# Patient Record
Sex: Female | Born: 1950 | Race: White
Health system: Southern US, Community
[De-identification: ages and names within clinical notes are randomized; demographics above are authoritative.]

## PROBLEM LIST (undated history)

## (undated) DIAGNOSIS — G43909 Migraine, unspecified, not intractable, without status migrainosus: Secondary | ICD-10-CM

## (undated) DIAGNOSIS — K5792 Diverticulitis of intestine, part unspecified, without perforation or abscess without bleeding: Secondary | ICD-10-CM

## (undated) DIAGNOSIS — R197 Diarrhea, unspecified: Secondary | ICD-10-CM

## (undated) DIAGNOSIS — C480 Malignant neoplasm of retroperitoneum: Secondary | ICD-10-CM

## (undated) DIAGNOSIS — K219 Gastro-esophageal reflux disease without esophagitis: Secondary | ICD-10-CM

## (undated) DIAGNOSIS — K635 Polyp of colon: Secondary | ICD-10-CM

## (undated) DIAGNOSIS — J40 Bronchitis, not specified as acute or chronic: Secondary | ICD-10-CM

## (undated) DIAGNOSIS — M810 Age-related osteoporosis without current pathological fracture: Secondary | ICD-10-CM

## (undated) DIAGNOSIS — Z9289 Personal history of other medical treatment: Secondary | ICD-10-CM

## (undated) DIAGNOSIS — R1032 Left lower quadrant pain: Secondary | ICD-10-CM

## (undated) DIAGNOSIS — D649 Anemia, unspecified: Secondary | ICD-10-CM

## (undated) DIAGNOSIS — H3320 Serous retinal detachment, unspecified eye: Secondary | ICD-10-CM

## (undated) DIAGNOSIS — S37039A Laceration of unspecified kidney, unspecified degree, initial encounter: Secondary | ICD-10-CM

## (undated) DIAGNOSIS — N814 Uterovaginal prolapse, unspecified: Secondary | ICD-10-CM

## (undated) DIAGNOSIS — C801 Malignant (primary) neoplasm, unspecified: Secondary | ICD-10-CM

## (undated) DIAGNOSIS — D509 Iron deficiency anemia, unspecified: Secondary | ICD-10-CM

## (undated) DIAGNOSIS — C189 Malignant neoplasm of colon, unspecified: Secondary | ICD-10-CM

## (undated) DIAGNOSIS — Z8669 Personal history of other diseases of the nervous system and sense organs: Secondary | ICD-10-CM

## (undated) HISTORY — DX: Serous retinal detachment, unspecified eye: H33.20

## (undated) HISTORY — PX: BOWEL RESECTION: SHX1257

## (undated) HISTORY — DX: Left lower quadrant pain: R10.32

## (undated) HISTORY — PX: HX NEPHRECTOMY: SHX65

## (undated) HISTORY — DX: Iron deficiency anemia, unspecified: D50.9

## (undated) HISTORY — DX: Age-related osteoporosis without current pathological fracture: M81.0

## (undated) HISTORY — PX: PANCREATECTOMY: SHX1019

## (undated) HISTORY — DX: Laceration of unspecified kidney, unspecified degree, initial encounter: S37.039A

## (undated) HISTORY — PX: HX ENDOSCOPIC SINUS SURGERY: 2100001109

## (undated) HISTORY — PX: HX COLECTOMY: SHX59

## (undated) HISTORY — DX: Malignant neoplasm of colon, unspecified: C18.9

## (undated) HISTORY — DX: Personal history of other diseases of the nervous system and sense organs: Z86.69

## (undated) HISTORY — PX: HX COLONOSCOPY: 2100001147

## (undated) HISTORY — DX: Gastro-esophageal reflux disease without esophagitis: K21.9

## (undated) HISTORY — DX: Personal history of other medical treatment: Z92.89

## (undated) HISTORY — PX: SPLENECTOMY, TOTAL: SHX788

## (undated) HISTORY — DX: Anemia, unspecified: D64.9

## (undated) HISTORY — PX: HX BREAST BIOPSY: SHX20

## (undated) HISTORY — DX: Uterovaginal prolapse, unspecified: N81.4

## (undated) HISTORY — PX: LUNG CANCER SURGERY: SHX702

## (undated) HISTORY — DX: Migraine, unspecified, not intractable, without status migrainosus: G43.909

## (undated) HISTORY — DX: Malignant (primary) neoplasm, unspecified: C80.1

## (undated) HISTORY — PX: TONSILLECTOMY: SUR1361

---
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
## 1985-11-29 ENCOUNTER — Ambulatory Visit: Admit: 1985-11-29 | Payer: Self-pay | Source: Ambulatory Visit

## 1985-12-15 ENCOUNTER — Inpatient Hospital Stay
Admission: AD | Admit: 1985-12-15 | Disposition: A | Discharge status: Home / Self Care | Payer: Self-pay | Source: Ambulatory Visit

## 1986-05-27 ENCOUNTER — Ambulatory Visit: Admit: 1986-05-27 | Payer: Self-pay | Source: Ambulatory Visit

## 1986-06-03 ENCOUNTER — Inpatient Hospital Stay
Admission: AD | Admit: 1986-06-03 | Disposition: A | Discharge status: Home / Self Care | Payer: Self-pay | Source: Ambulatory Visit

## 1988-06-20 ENCOUNTER — Ambulatory Visit: Admit: 1988-06-20 | Payer: Self-pay | Source: Ambulatory Visit

## 1992-01-13 ENCOUNTER — Ambulatory Visit: Admit: 1992-01-13 | Disposition: A | Discharge status: Home / Self Care | Payer: Self-pay | Source: Ambulatory Visit

## 1992-01-27 ENCOUNTER — Ambulatory Visit: Admit: 1992-01-27 | Disposition: A | Discharge status: Home / Self Care | Payer: Self-pay | Source: Ambulatory Visit

## 1993-12-21 ENCOUNTER — Ambulatory Visit: Admit: 1993-12-21 | Disposition: A | Discharge status: Home / Self Care | Payer: Self-pay | Source: Ambulatory Visit

## 1993-12-29 ENCOUNTER — Ambulatory Visit: Admit: 1993-12-29 | Disposition: A | Discharge status: Home / Self Care | Payer: Self-pay | Source: Ambulatory Visit

## 1994-01-16 ENCOUNTER — Ambulatory Visit: Admit: 1994-01-16 | Disposition: A | Discharge status: Home / Self Care | Payer: Self-pay | Source: Ambulatory Visit

## 2001-01-23 ENCOUNTER — Ambulatory Visit
Admission: RE | Admit: 2001-01-23 | Disposition: A | Discharge status: Home / Self Care | Payer: Self-pay | Source: Ambulatory Visit

## 2001-01-25 ENCOUNTER — Emergency Department: Payer: Self-pay

## 2001-06-04 ENCOUNTER — Ambulatory Visit
Admit: 2001-06-04 | Disposition: A | Discharge status: Home / Self Care | Payer: Self-pay | Source: Ambulatory Visit | Admitting: Specialist

## 2001-09-25 ENCOUNTER — Ambulatory Visit
Admit: 2001-09-25 | Disposition: A | Discharge status: Home / Self Care | Payer: Self-pay | Source: Ambulatory Visit | Admitting: Specialist

## 2002-01-08 ENCOUNTER — Ambulatory Visit
Admission: RE | Admit: 2002-01-08 | Disposition: A | Discharge status: Home / Self Care | Payer: Self-pay | Source: Ambulatory Visit

## 2002-01-23 ENCOUNTER — Ambulatory Visit
Admission: RE | Admit: 2002-01-23 | Disposition: A | Discharge status: Home / Self Care | Payer: Self-pay | Source: Ambulatory Visit

## 2002-02-04 ENCOUNTER — Ambulatory Visit
Admission: RE | Admit: 2002-02-04 | Disposition: A | Discharge status: Home / Self Care | Payer: Self-pay | Source: Ambulatory Visit

## 2002-05-12 ENCOUNTER — Emergency Department: Admit: 2002-05-12 | Payer: Self-pay | Source: Emergency Department | Admitting: Emergency Medicine

## 2002-06-06 ENCOUNTER — Ambulatory Visit: Admit: 2002-06-06 | Disposition: A | Discharge status: Home / Self Care | Payer: Self-pay | Source: Ambulatory Visit

## 2002-07-19 ENCOUNTER — Ambulatory Visit: Payer: Self-pay

## 2002-07-31 ENCOUNTER — Ambulatory Visit
Admission: RE | Admit: 2002-07-31 | Disposition: A | Discharge status: Home / Self Care | Payer: Self-pay | Source: Ambulatory Visit

## 2002-11-21 ENCOUNTER — Emergency Department: Payer: Self-pay

## 2003-06-06 DIAGNOSIS — N63 Unspecified lump in unspecified breast: Secondary | ICD-10-CM

## 2003-06-06 HISTORY — DX: Unspecified lump in unspecified breast: N63.0

## 2003-07-15 ENCOUNTER — Ambulatory Visit
Admission: RE | Admit: 2003-07-15 | Disposition: A | Discharge status: Home / Self Care | Payer: Self-pay | Source: Ambulatory Visit

## 2005-01-08 ENCOUNTER — Emergency Department: Payer: Self-pay

## 2005-10-03 ENCOUNTER — Ambulatory Visit
Admission: RE | Admit: 2005-10-03 | Disposition: A | Discharge status: Home / Self Care | Payer: Self-pay | Source: Ambulatory Visit

## 2005-10-18 ENCOUNTER — Ambulatory Visit: Admit: 2005-10-18 | Disposition: A | Discharge status: Home / Self Care | Payer: Self-pay | Source: Ambulatory Visit

## 2005-10-26 ENCOUNTER — Ambulatory Visit
Admission: RE | Admit: 2005-10-26 | Disposition: A | Discharge status: Home / Self Care | Payer: Self-pay | Source: Ambulatory Visit

## 2005-11-22 ENCOUNTER — Ambulatory Visit
Admission: RE | Admit: 2005-11-22 | Disposition: A | Discharge status: Home / Self Care | Payer: Self-pay | Source: Ambulatory Visit

## 2005-12-25 ENCOUNTER — Emergency Department
Admission: EM | Admit: 2005-12-25 | Disposition: A | Discharge status: Home / Self Care | Payer: Self-pay | Source: Ambulatory Visit

## 2006-02-19 ENCOUNTER — Ambulatory Visit: Payer: Self-pay

## 2006-08-02 ENCOUNTER — Ambulatory Visit: Payer: Self-pay

## 2007-04-03 ENCOUNTER — Ambulatory Visit
Admission: RE | Admit: 2007-04-03 | Disposition: A | Discharge status: Home / Self Care | Payer: Self-pay | Source: Ambulatory Visit

## 2007-11-23 ENCOUNTER — Inpatient Hospital Stay (HOSPITAL_BASED_OUTPATIENT_CLINIC_OR_DEPARTMENT_OTHER)
Admission: EM | Admit: 2007-11-23 | Discharge: 2007-11-23 | Disposition: A | Discharge status: Home / Self Care | Payer: No Typology Code available for payment source

## 2007-11-23 ENCOUNTER — Emergency Department: Payer: Self-pay

## 2008-01-23 ENCOUNTER — Emergency Department: Payer: Self-pay

## 2008-01-23 ENCOUNTER — Inpatient Hospital Stay (HOSPITAL_BASED_OUTPATIENT_CLINIC_OR_DEPARTMENT_OTHER)
Admission: EM | Admit: 2008-01-23 | Discharge: 2008-01-23 | Disposition: A | Discharge status: Home / Self Care | Payer: No Typology Code available for payment source

## 2008-04-03 ENCOUNTER — Ambulatory Visit: Payer: Self-pay

## 2008-06-02 ENCOUNTER — Ambulatory Visit: Payer: Self-pay

## 2008-06-04 ENCOUNTER — Ambulatory Visit: Payer: Self-pay

## 2008-06-18 ENCOUNTER — Ambulatory Visit: Payer: Self-pay

## 2008-10-19 ENCOUNTER — Ambulatory Visit: Admission: RE | Admit: 2008-10-19 | Payer: Self-pay | Source: Ambulatory Visit | Admitting: EXTERNAL

## 2008-11-09 ENCOUNTER — Ambulatory Visit: Admission: RE | Admit: 2008-11-09 | Payer: Self-pay | Source: Ambulatory Visit | Admitting: EXTERNAL

## 2008-11-10 ENCOUNTER — Ambulatory Visit: Admission: RE | Admit: 2008-11-10 | Payer: Self-pay | Source: Ambulatory Visit | Admitting: EXTERNAL

## 2009-04-06 ENCOUNTER — Ambulatory Visit: Admission: RE | Admit: 2009-04-06 | Payer: Self-pay | Source: Ambulatory Visit

## 2009-09-04 ENCOUNTER — Ambulatory Visit: Admit: 2009-09-04 | Payer: Self-pay | Source: Ambulatory Visit

## 2009-09-20 ENCOUNTER — Other Ambulatory Visit: Payer: Self-pay

## 2009-09-20 ENCOUNTER — Ambulatory Visit: Admit: 2009-09-20 | Payer: Self-pay | Source: Ambulatory Visit | Admitting: Family Medicine

## 2009-09-20 ENCOUNTER — Ambulatory Visit (INDEPENDENT_AMBULATORY_CARE_PROVIDER_SITE_OTHER): Payer: No Typology Code available for payment source | Admitting: Family Medicine

## 2009-09-20 VITALS — BP 118/70 | HR 68 | Resp 14 | Ht 67.2 in | Wt 188.3 lb

## 2009-09-22 LAB — HISTORICAL CYTOPATHOLOGY-GYN (PAP AND HPV TESTS): Final Diagnosis: NEGATIVE

## 2009-09-27 ENCOUNTER — Ambulatory Visit: Admission: RE | Admit: 2009-09-27 | Payer: Self-pay | Source: Ambulatory Visit | Admitting: Family Medicine

## 2009-09-28 ENCOUNTER — Ambulatory Visit: Admission: RE | Admit: 2009-09-28 | Payer: Self-pay | Source: Ambulatory Visit | Admitting: UROLOGY

## 2009-10-21 ENCOUNTER — Encounter (INDEPENDENT_AMBULATORY_CARE_PROVIDER_SITE_OTHER): Payer: Self-pay | Admitting: Family Medicine

## 2009-10-21 NOTE — Progress Notes (Signed)
Subjective:     [name] is a 59 y.o. female here for annual exam.  She is also complaining of the following issues:    1. Urinary frequency but has an appointment scheduled with urologist.  Past Medical History   Diagnosis Date   . Breast lump 2005     Resolved prior to biopsy     Past Surgical History   Procedure Date   . Hx cesarean section 1993   . Hx tonsillectomy 1955     Allergies   Allergen Reactions   . Percocet (Oxycodone-acetaminophen) Nausea/ Vomiting         No outpatient prescriptions prior to encounter  Family History   Problem Relation Age of Onset   . Congestive Heart Failure Mother    . Diabetes Father    . Colon Cancer Mother    . Colon Cancer Father        OB History    Grav Para Term Preterm Abortions TAB SAB Ect Mult Living    2 2        2           History   Social History   . Marital Status: Married     Spouse Name: N/A     Number of Children: 2   . Years of Education: N/A   Social History Main Topics   . Smoking status: No   . Smokeless tobacco: No   . Alcohol Use: No   . Drug Use: No       Review of Systems: Other than ROS in the HPI, all other systems were negative.   Objective:     BP 118/70   Pulse 68   Resp 14   Ht 1.707 m (5' 7.2")   Wt 85.412 kg (188 lb 4.8 oz)   BMI 29.32 kg/m2 Body mass index is 29.32 kg/(m^2).    Normalized BMI data available only for age 48 to 20 years.  No LMP recorded. Patient is postmenopausal.  General:  alert, cooperative, no distress   Head:  normocephalic, atraumatic.    Oropharynx: normal   ENT: ENT exam normal, no neck nodes or sinus tenderness   Neck: nontender, no masses, thyroid nonenlarged   Lung: clear to auscultation bilaterally   Heart:  regular rate and rhythm, S1, S2 normal, no murmur, click, rub or gallop   Abdomen: Soft, non-tender, non-distended. Bowel sounds normal. No masses. No organomegaly   Extremities: extremities normal, atraumatic, no cyanosis or edema    Skin: Warm and dry. no hyperpigmentation, vitiligo, or suspicious lesions   Neuro: normal without focal findings  mental status, speech normal, alert and oriented x iii  PERLA  gait and station normal   Breast exam: normal without suspicious masses, skin or nipple changes or axillary nodes, self-exam is taught and encouraged   Pelvic exam:  exam chaperoned by nurse, normal vagina and vulva, normal cervix without lesions, polyps or tenderness, uterus normal size, shape, consistency, no mass or tenderness, adnexa normal in size without mass or tenderness, normal pelvic floor musculature, pap smear done today     Assessment/Plan:       V76.68F Screening for cervical cancer  (primary encounter diagnosis)  Plan: CYTOPATHOLOGY-GYN (PAP TESTS ONLY)    V72.31AA Gynecologic exam  Plan: MAMMO BILATERAL SCREENING    285.9AA Anemia    Orders Placed This Encounter   . Mammo Bilateral Screening   . CYTOPATHOLOGY-GYN (PAP TESTS ONLY)     follow up prn

## 2010-03-03 ENCOUNTER — Ambulatory Visit: Admit: 2010-03-03 | Payer: Self-pay | Source: Ambulatory Visit

## 2010-03-03 LAB — CBC
BASOPHIL #: 0.05 K/uL (ref 0.00–0.10)
BASOPHILS %: 1.1 % (ref 0.0–1.4)
EOSINOPHIL #: 0.09 K/uL (ref 0.00–0.50)
EOSINOPHIL %: 2 % (ref 0.0–5.2)
HCT: 35.2 % — ABNORMAL LOW (ref 36.0–45.0)
HGB: 11.6 g/dL — ABNORMAL LOW (ref 12.0–15.5)
LYMPHOCYTE #: 1.37 K/uL (ref 0.70–3.20)
LYMPHOCYTE %: 29.7 % (ref 15.0–43.0)
MCH: 32.1 pg (ref 28.0–34.0)
MCHC: 33.1 g/dL (ref 33.0–37.0)
MCV: 97 fL — AB (ref 82.0–97.0)
MONOCYTE #: 0.38 K/uL (ref 0.20–0.90)
MONOCYTE %: 8.2 % (ref 4.8–12.0)
MPV: 9.6 fL — ABNORMAL HIGH (ref 7.0–9.4)
PLATELET COUNT: 172 K/uL (ref 150–400)
PMN #: 2.72 K/uL (ref 1.50–6.50)
PMN %: 58.9 % (ref 43.0–76.0)
RBC: 3.63 M/uL — ABNORMAL LOW (ref 4.00–5.10)
RDW: 11.6 % (ref 11.0–13.0)
WBC: 4.6 K/uL (ref 4.0–11.0)

## 2010-03-03 LAB — COMPREHENSIVE METABOLIC PROFILE - BMC/JMC ONLY
ALBUMIN: 4.3 g/dL (ref 3.2–5.0)
ALKALINE PHOSPHATASE: 50 IU/L (ref 35–120)
ALT (SGPT): 28 IU/L (ref 0–55)
AST (SGOT): 32 IU/L (ref 0–45)
BILIRUBIN, TOTAL: 0.9 mg/dL (ref 0.0–1.3)
BUN: 14 mg/dL (ref 6–22)
CALCIUM: 9.5 mg/dL (ref 8.5–10.5)
CARBON DIOXIDE: 29 mmol/L (ref 22–32)
CHLORIDE: 103 mmol/L (ref 101–111)
CREATININE: 0.63 mg/dL (ref 0.53–1.00)
ESTIMATED GLOMERULAR FILTRATION RATE: >60 mL/min (ref >60)
GLUCOSE: 88 mg/dL (ref 70–110)
POTASSIUM: 4.1 mmol/L (ref 3.5–5.0)
SODIUM: 139 mmol/L (ref 136–145)
TOTAL PROTEIN: 6.8 g/dL (ref 6.0–8.0)

## 2010-03-09 LAB — CBC
BASOPHIL #: 0 K/uL (ref 0.00–0.10)
BASOPHILS %: 0 % (ref 0.0–1.4)
EOSINOPHIL #: 0.16 K/uL (ref 0.00–0.50)
EOSINOPHIL %: 4.2 % (ref 0.0–5.2)
HCT: 38.1 % (ref 36.0–45.0)
HGB: 12.3 g/dL (ref 12.0–15.5)
LYMPHOCYTE #: 1.11 K/uL (ref 0.70–3.20)
LYMPHOCYTE %: 28.1 % (ref 15.0–43.0)
MCH: 31.4 pg (ref 28.0–34.0)
MCHC: 32.2 g/dL — ABNORMAL LOW (ref 33.0–37.0)
MCV: 97.7 fL — ABNORMAL HIGH (ref 82.0–97.0)
MONOCYTE #: 0.55 K/uL (ref 0.20–0.90)
MONOCYTE %: 13.9 % — ABNORMAL HIGH (ref 4.8–12.0)
MPV: 8.3 fL (ref 7.0–9.4)
PLATELET COUNT: 161 K/uL (ref 150–400)
PMN #: 2.12 K/uL (ref 1.50–6.50)
PMN %: 53.8 % (ref 43.0–76.0)
RBC: 3.9 M/uL — ABNORMAL LOW (ref 4.00–5.10)
RDW: 11.4 % (ref 11.0–13.0)
WBC: 3.9 K/uL — ABNORMAL LOW (ref 4.0–11.0)

## 2010-03-09 LAB — COMPREHENSIVE METABOLIC PROFILE - BMC/JMC ONLY
ALBUMIN: 3.9 g/dL (ref 3.2–5.0)
BUN: 12 mg/dL (ref 6–22)
CREATININE: 0.84 mg/dL (ref 0.53–1.00)
ESTIMATED GLOMERULAR FILTRATION RATE: >60 mL/min (ref >60)
GLUCOSE: 92 mg/dL (ref 70–110)
TOTAL PROTEIN: 6.2 g/dL (ref 6.0–8.0)

## 2010-03-09 LAB — LIPASE: LIPASE: 24 U/L (ref 0–60)

## 2010-03-09 LAB — C-REACTIVE PROTEIN(CRP),INFLAMMATION: C-REACTIVE PROTEIN (CRP),INFLAMMATION: 3.2 mg/L (ref 0.0–8.0)

## 2010-03-10 ENCOUNTER — Inpatient Hospital Stay
Admission: RE | Admit: 2010-03-10 | Discharge: 2010-03-12 | Disposition: A | Discharge status: Home / Self Care | Payer: Self-pay | Source: Ambulatory Visit

## 2010-03-10 LAB — TRANSFERRIN SATURATION: TRANSFERRIN SATURATION: 22 % (ref 15–50)

## 2010-03-10 LAB — CBC
BASOPHIL #: 0.02 K/uL (ref 0.00–0.10)
BASOPHILS %: 0.8 % (ref 0.0–1.4)
EOSINOPHIL #: 0.17 K/uL (ref 0.00–0.50)
EOSINOPHIL %: 6 % — ABNORMAL HIGH (ref 0.0–5.2)
HCT: 33.4 % — ABNORMAL LOW (ref 36.0–45.0)
HGB: 11 g/dL — ABNORMAL LOW (ref 12.0–15.5)
LYMPHOCYTE #: 1.21 K/uL (ref 0.70–3.20)
LYMPHOCYTE %: 41.5 % (ref 15.0–43.0)
MCH: 32 pg (ref 28.0–34.0)
MCHC: 32.9 g/dL — ABNORMAL LOW (ref 33.0–37.0)
MCV: 97.5 fL — ABNORMAL HIGH (ref 82.0–97.0)
MONOCYTE #: 0.48 K/uL (ref 0.20–0.90)
MONOCYTE %: 16.6 % — ABNORMAL HIGH (ref 4.8–12.0)
MPV: 9 fL (ref 7.0–9.4)
PLATELET COUNT: 165 K/uL (ref 150–400)
PMN #: 1.02 K/uL — ABNORMAL LOW (ref 1.50–6.50)
PMN %: 35.1 % — ABNORMAL LOW (ref 43.0–76.0)
RBC: 3.42 M/uL — ABNORMAL LOW (ref 4.00–5.10)
RDW: 11.3 % (ref 11.0–13.0)
WBC: 2.9 K/uL — ABNORMAL LOW (ref 4.0–11.0)

## 2010-03-10 LAB — BASIC METABOLIC PROFILE - BMC/JMC ONLY
BUN: 8 mg/dL (ref 6–22)
CALCIUM: 8.8 mg/dL (ref 8.5–10.5)
CARBON DIOXIDE: 27 mmol/L (ref 22–32)
CHLORIDE: 106 mmol/L (ref 101–111)
CREATININE: 0.69 mg/dL (ref 0.53–1.00)
ESTIMATED GLOMERULAR FILTRATION RATE: >60 mL/min (ref >60)
GLUCOSE: 93 mg/dL (ref 70–110)
POTASSIUM: 4 mmol/L (ref 3.5–5.0)
SODIUM: 138 mmol/L (ref 136–145)

## 2010-03-10 LAB — URINALYSIS, MACROSCOPIC AND MICROSCOPIC
BILIRUBIN,URINE: NEGATIVE mg/dL
BLOOD,URINE: NEGATIVE mg/dL
GLUCOSE, URINE: NEGATIVE mg/dL
KETONES,URINE: NEGATIVE mg/dL
LEUKOCYTE ESTERASE,URINE: NEGATIVE LEU/uL
NITRITES,URINE: NEGATIVE
PH,URINE: 6.5 (ref <8.0)
PROTEIN, URINE, RANDOM: NEGATIVE mg/dL
SPECIFIC GRAVITY,URINE: 1.004 (ref <1.022)
UROBILINOGEN, URINE: <2 mg/dL (ref <2.0)

## 2010-03-10 LAB — C-REACTIVE PROTEIN(CRP),INFLAMMATION
C-REACTIVE PROTEIN (CRP),INFLAMMATION: 2.4 mg/L — AB (ref 0.0–8.0)
C-REACTIVE PROTEIN (CRP),INFLAMMATION: 2.2 mg/L (ref 0.0–8.0)

## 2010-03-10 LAB — TRANSFERRIN: TRANSFERRIN: 197 mg/dL (ref 184–359)

## 2010-03-10 LAB — FERRITIN: FERRITIN: 11 ng/mL (ref 11–307)

## 2010-03-10 LAB — SEDIMENTATION RATE: SEDIMENTATION RATE: 8 mm/hr (ref 0–30)

## 2010-03-10 LAB — IRON: IRON: 63 ug/dL (ref 35–145)

## 2010-03-10 LAB — VITAMIN B12: VITAMIN B12: 481 pg/mL (ref 180–914)

## 2010-03-10 LAB — THYROXINE, FREE (FREE T4): THYROXINE, FREE (FREE T4): 0.87 ng/dL (ref 0.58–1.64)

## 2010-03-10 LAB — FOLATE: FOLATE: 17.9 ng/mL (ref >3.6)

## 2010-03-10 LAB — FREE T3 - BMC/JMC ONLY: FREE T3: 2.2 pg/mL — ABNORMAL LOW (ref 2.5–3.9)

## 2010-03-10 LAB — TSH,3RD GENERATION: TSH 3RD GENERATION: 1.155 u[IU]/mL (ref 0.340–5.600)

## 2010-03-11 LAB — CBC
BASOPHIL #: 0.03 K/uL (ref 0.00–0.10)
BASOPHILS %: 1 % (ref 0.0–1.4)
EOSINOPHIL #: 0.14 K/uL (ref 0.00–0.50)
EOSINOPHIL %: 5 % (ref 0.0–5.2)
HCT: 31.4 % — ABNORMAL LOW (ref 36.0–45.0)
HGB: 10.4 g/dL — ABNORMAL LOW (ref 12.0–15.5)
LYMPHOCYTE #: 1.27 K/uL (ref 0.70–3.20)
LYMPHOCYTE %: 45 % — ABNORMAL HIGH (ref 15.0–43.0)
MCH: 32.4 pg (ref 28.0–34.0)
MCHC: 33.2 g/dL (ref 33.0–37.0)
MCV: 97.5 fL — ABNORMAL HIGH (ref 82.0–97.0)
MONOCYTE #: 0.33 K/uL (ref 0.20–0.90)
MONOCYTE %: 11.6 % (ref 4.8–12.0)
MPV: 8.7 fL (ref 7.0–9.4)
PLATELET COUNT: 137 K/uL — ABNORMAL LOW (ref 150–400)
PMN #: 1.05 K/uL — ABNORMAL LOW (ref 1.50–6.50)
PMN %: 37.3 % — ABNORMAL LOW (ref 43.0–76.0)
RBC: 3.22 M/uL — ABNORMAL LOW (ref 4.00–5.10)
RDW: 11.3 % (ref 11.0–13.0)
WBC: 2.8 K/uL — ABNORMAL LOW (ref 4.0–11.0)

## 2010-03-11 LAB — COMPREHENSIVE METABOLIC PROFILE - BMC/JMC ONLY
ALBUMIN: 3.1 g/dL — AB (ref 3.2–5.0)
ALKALINE PHOSPHATASE: 37 IU/L (ref 35–120)
ALT (SGPT): 22 IU/L — AB (ref 0–55)
AST (SGOT): 25 IU/L (ref 0–45)
BILIRUBIN, TOTAL: 0.5 mg/dL (ref 0.0–1.3)
BUN: 9 mg/dL (ref 6–22)
CALCIUM: 8.5 mg/dL (ref 8.5–10.5)
CARBON DIOXIDE: 29 mmol/L (ref 22–32)
CHLORIDE: 110 mmol/L (ref 101–111)
CREATININE: 0.67 mg/dL (ref 0.53–1.00)
ESTIMATED GLOMERULAR FILTRATION RATE: >60 mL/min (ref >60)
GLUCOSE: 99 mg/dL (ref 70–110)
POTASSIUM: 3.7 mmol/L (ref 3.5–5.0)
SODIUM: 142 mmol/L (ref 136–145)
TOTAL PROTEIN: 5 g/dL — AB (ref 6.0–8.0)

## 2010-03-12 LAB — CBC
BASOPHIL #: 0.02 K/uL (ref 0.00–0.10)
BASOPHILS %: 0.7 % (ref 0.0–1.4)
EOSINOPHIL #: 0.12 K/uL (ref 0.00–0.50)
EOSINOPHIL %: 3.8 % (ref 0.0–5.2)
HCT: 33 % — ABNORMAL LOW (ref 36.0–45.0)
HGB: 10.4 g/dL — ABNORMAL LOW (ref 12.0–15.5)
LYMPHOCYTE #: 1.32 K/uL (ref 0.70–3.20)
LYMPHOCYTE %: 42.3 % (ref 15.0–43.0)
MCH: 30.8 pg (ref 28.0–34.0)
MCHC: 31.5 g/dL — ABNORMAL LOW (ref 33.0–37.0)
MCV: 97.9 fL — ABNORMAL HIGH (ref 82.0–97.0)
MONOCYTE #: 0.43 K/uL (ref 0.20–0.90)
MONOCYTE %: 13.7 % — ABNORMAL HIGH (ref 4.8–12.0)
MPV: 9 fL (ref 7.0–9.4)
PLATELET COUNT: 144 K/uL — ABNORMAL LOW (ref 150–400)
PMN #: 1.23 K/uL — ABNORMAL LOW (ref 1.50–6.50)
PMN %: 39.5 % — ABNORMAL LOW (ref 43.0–76.0)
RBC: 3.37 M/uL — ABNORMAL LOW (ref 4.00–5.10)
RDW: 11.3 % (ref 11.0–13.0)
WBC: 3.1 K/uL — ABNORMAL LOW (ref 4.0–11.0)

## 2010-03-12 LAB — COMPREHENSIVE METABOLIC PROFILE - BMC/JMC ONLY
ALBUMIN: 3.3 g/dL (ref 3.2–5.0)
ALKALINE PHOSPHATASE: 37 IU/L (ref 35–120)
ALT (SGPT): 21 IU/L — AB (ref 0–55)
AST (SGOT): 27 IU/L (ref 0–45)
BILIRUBIN, TOTAL: 0.4 mg/dL (ref 0.0–1.3)
BUN: 8 mg/dL (ref 6–22)
CALCIUM: 8.7 mg/dL (ref 8.5–10.5)
CARBON DIOXIDE: 26 mmol/L (ref 22–32)
CHLORIDE: 110 mmol/L (ref 101–111)
CREATININE: 0.65 mg/dL (ref 0.53–1.00)
ESTIMATED GLOMERULAR FILTRATION RATE: >60 mL/min (ref >60)
GLUCOSE: 100 mg/dL (ref 70–110)
POTASSIUM: 3.6 mmol/L (ref 3.5–5.0)
SODIUM: 139 mmol/L (ref 136–145)
TOTAL PROTEIN: 5.3 g/dL — ABNORMAL LOW (ref 6.0–8.0)

## 2010-03-12 LAB — URINE CULTURE

## 2010-03-12 LAB — CLOS DIFFICILE BY DNA - BMC/JMC ONLY: CLOS DIFFICILE BY DNA: NEGATIVE

## 2010-03-12 LAB — FECAL WHITE BLOOD CELLS: STOOL FOR LEUKOCYTES: NONE SEEN

## 2010-03-12 LAB — C-REACTIVE PROTEIN(CRP),INFLAMMATION: C-REACTIVE PROTEIN (CRP),INFLAMMATION: 0.8 mg/L — AB (ref 0.0–8.0)

## 2010-03-13 LAB — STOOL CULTURE - BMC/JMC ONLY

## 2010-03-14 LAB — HEP-2 SUBSTRATE ANTINUCLEAR ANTIBODIES (ANA), SERUM: ANTI-NUCLEAR AB: NEGATIVE

## 2010-03-16 LAB — OVA & PARASITES W/TRICHROME - BMC/JMC ONLY

## 2010-03-31 ENCOUNTER — Ambulatory Visit: Admission: RE | Admit: 2010-03-31 | Payer: Self-pay | Source: Ambulatory Visit | Admitting: Hematology & Oncology

## 2010-03-31 LAB — CBC
BASOPHIL #: 0.03 K/uL (ref 0.0–0.10)
BASOPHILS %: 0.6 % (ref 0–2.50)
EOSINOPHIL #: 0.13 K/uL (ref 0.00–0.50)
EOSINOPHIL %: 2.5 % (ref 0.0–5.2)
HCT: 36.7 % (ref 36.0–48.0)
HGB: 12 g/dL (ref 12.0–16.0)
LYMPHOCYTE #: 1.51 K/uL (ref 0.7–3.20)
LYMPHOCYTE %: 28.3 % (ref 15.0–43.0)
MCH: 31 pg (ref 28.3–34.3)
MCHC: 32.6 g/dL (ref 32.0–36.0)
MCV: 95.2 fL (ref 82.0–100.0)
MONOCYTE #: 0.63 K/uL (ref 0.20–0.90)
MONOCYTE %: 11.9 % (ref 4.8–12.0)
MPV: 8.4 fL (ref 7.4–10.45)
PLATELET COUNT: 190 K/uL — AB (ref 150–400)
PMN #: 3.03 K/uL (ref 1.5–6.5)
PMN %: 56.7 % (ref 43.0–76.0)
RBC: 3.86 M/uL — ABNORMAL LOW (ref 4.0–5.6)
RDW: 11.9 % (ref 11.0–16.0)
WBC: 5.3 K/uL (ref 4.0–11.0)

## 2010-04-05 ENCOUNTER — Encounter (INDEPENDENT_AMBULATORY_CARE_PROVIDER_SITE_OTHER): Payer: Self-pay | Admitting: Hematology & Oncology

## 2010-04-05 ENCOUNTER — Ambulatory Visit (INDEPENDENT_AMBULATORY_CARE_PROVIDER_SITE_OTHER): Payer: No Typology Code available for payment source | Admitting: Hematology & Oncology

## 2010-04-06 ENCOUNTER — Ambulatory Visit: Admission: RE | Admit: 2010-04-06 | Payer: Self-pay | Source: Ambulatory Visit | Admitting: Hematology & Oncology

## 2010-04-06 NOTE — Progress Notes (Signed)
[facility] , Called pt insurance no prior auth needed per Roxana Pt has been informed to drink 32oz of water prior one hour and a half before test pt has been [facility] pt verbally understood

## 2010-04-09 NOTE — Progress Notes (Signed)
[facility]  [address]  [place], [place] [address]    PROGRESS NOTE    PATIENT NAME: [name], [name]  CHART NUMBER: [id number]  DATE OF BIRTH: [birth year]  DATE OF SERVICE: 04/05/2010    REASON FOR VISIT:  Anemia and pancytopenia.    SUBJECTIVE:  [name] is a 59 year old female who was seen in the hospital in a consultation by myself.  At the time she was seen, she had leukopenia, anemia, and thrombocytopenia.  A number of tests were done. Thyroid functions are normal.  Her TSH is normal.  Her C-reactive protein was normal.  Her sedimentation rate was normal.  ANA normal.  Her iron was normal, but her ferritin was at the low end of normal.  Folate and B12 were also normal.      The patient had been admitted to the hospital because of left lower quadrant pain.  The exact etiology of the pain remains uncertain.  She went through several courses of antibiotics on the presumptive diagnosis of diverticulitis.  She was placed in the hospital and given intravenous antibiotics with only a slight improvement.  At the time she was seen, the most obvious potential cause of her pancytopenia was the fact that she was using nutraceuticals.  I asked her to stop the nutraceuticals and to see me in approximately 4 weeks later  To determine if the counts had improved.  Today's visit was for that purpose.     She has indeed stopped all the nutritional supplements.  A repeat CBC is now essentially normal.  White count, hemoglobin and a platelet count have all returned to within normal range.  While this does not prove that the nutraceuticals were the cause, it certainly places a high likelihood upon it.  Regardless, it avoids the need for a bone marrow biopsy.     The patient continues to have left lower quadrant pain in spite of the antibiotics.  She is also beginning to have bloating.  She has not had a good GYN examination and I suggested to her that she have an ultrasound and be seen by a gynecologist. She also complains of fatigue.    OBJECTIVE:  The blood pressure is 120/80, pulse is 80.      Skin:  Normal color and turgor.  HEENT:  Normal oral mucosa.  No adenopathy.  Lungs are clear.  Cardiovascular:  Regular rhythm, no murmurs or gallops. Abdomen:  Soft, nontender, no masses.  Persistent tenderness in the left lower quadrant.  Extremities:  No edema.    ASSESSMENT:  There appeared to be two separate issues in this patient's care.  Her ferritin, while normal is at the low end of normal, suggesting the possibility of iron deficiency as contributing to her fatigue.  She states she is exhausted all the time.  She indicates that throughout her life she has been told that her ferritins have been at the low end of normal, but always normal.  I suggested she try supplemental iron.      As related to her bloating and left lower quadrant pain, the concern was whether or not this is a gynecologic problem rather than diverticulitis.  Consequently, the patient is being referred to GYN for a more thorough evaluation.    PLAN:  1.  Trial of over-the-counter Feosol.  2.  Refer for GYN evaluation.  3.  Ultrasound of the pelvis.  4.  Return to clinic in one month.      Daleen Snook,  MD  Assistant Professor, [facility] Hematology Oncology  [phone]    [id number]/[id number]; D: 04/05/2010 12:57:45; T: 04/05/2010 15:00:19    cc:                      Orvil Feil MD      [facility] [address]      [place], [place] [address]

## 2010-04-11 ENCOUNTER — Encounter (INDEPENDENT_AMBULATORY_CARE_PROVIDER_SITE_OTHER): Payer: Self-pay | Admitting: Family Medicine

## 2010-04-11 ENCOUNTER — Encounter (INDEPENDENT_AMBULATORY_CARE_PROVIDER_SITE_OTHER): Payer: No Typology Code available for payment source | Admitting: Family Medicine

## 2010-04-11 ENCOUNTER — Ambulatory Visit (INDEPENDENT_AMBULATORY_CARE_PROVIDER_SITE_OTHER): Payer: No Typology Code available for payment source | Admitting: Family Medicine

## 2010-04-11 VITALS — BP 128/70 | HR 72 | Temp 97.9°F | Resp 16 | Ht 68.0 in | Wt 189.3 lb

## 2010-04-14 ENCOUNTER — Encounter (INDEPENDENT_AMBULATORY_CARE_PROVIDER_SITE_OTHER): Payer: Self-pay

## 2010-04-14 ENCOUNTER — Ambulatory Visit: Admission: RE | Admit: 2010-04-14 | Payer: Self-pay | Source: Ambulatory Visit | Admitting: Family Medicine

## 2010-04-14 ENCOUNTER — Ambulatory Visit (INDEPENDENT_AMBULATORY_CARE_PROVIDER_SITE_OTHER): Payer: No Typology Code available for payment source

## 2010-04-14 DIAGNOSIS — R109 Unspecified abdominal pain: Secondary | ICD-10-CM

## 2010-05-07 ENCOUNTER — Ambulatory Visit: Admission: RE | Admit: 2010-05-07 | Payer: Self-pay | Source: Ambulatory Visit | Admitting: Hematology & Oncology

## 2010-05-07 LAB — CBC
BASOPHIL #: 0.05 K/uL (ref 0.0–0.10)
BASOPHILS %: 1.4 % (ref 0–2.50)
EOSINOPHIL #: 0.17 K/uL (ref 0.00–0.50)
EOSINOPHIL %: 4.9 % (ref 0.0–5.2)
HCT: 40.2 % (ref 36.0–48.0)
HGB: 13 g/dL (ref 12.0–16.0)
LYMPHOCYTE #: 1.08 K/uL (ref 0.7–3.20)
LYMPHOCYTE %: 31.2 % (ref 15.0–43.0)
MCH: 30.7 pg (ref 28.3–34.3)
MCHC: 32.4 g/dL (ref 32.0–36.0)
MCV: 94.9 fL (ref 82.0–100.0)
MONOCYTE #: 0.31 K/uL (ref 0.20–0.90)
MONOCYTE %: 9 % (ref 4.8–12.0)
MPV: 9.2 fL (ref 7.4–10.45)
PLATELET COUNT: 173 K/uL (ref 150–400)
PMN #: 1.84 K/uL (ref 1.5–6.5)
PMN %: 53.5 % (ref 43.0–76.0)
RBC: 4.23 M/uL (ref 4.0–5.6)
RDW: 11.7 % (ref 11.0–16.0)
WBC: 3.4 K/uL — ABNORMAL LOW (ref 4.0–11.0)

## 2010-05-09 ENCOUNTER — Ambulatory Visit (INDEPENDENT_AMBULATORY_CARE_PROVIDER_SITE_OTHER): Payer: No Typology Code available for payment source | Admitting: Hematology & Oncology

## 2010-05-09 LAB — FERRITIN: FERRITIN: 17.4 ng/mL (ref 11.0–306.8)

## 2010-05-12 ENCOUNTER — Ambulatory Visit: Admission: RE | Admit: 2010-05-12 | Payer: Self-pay | Source: Ambulatory Visit | Admitting: Hematology & Oncology

## 2010-05-13 ENCOUNTER — Ambulatory Visit: Admission: RE | Admit: 2010-05-13 | Payer: Self-pay | Source: Ambulatory Visit

## 2010-05-13 NOTE — Progress Notes (Signed)
[facility]  [address]  [place], [place] [address]    PROGRESS NOTE    PATIENT NAME: [name], [name]  CHART NUMBER: [id number]  DATE OF BIRTH: 08-10-1950  DATE OF SERVICE: 05/09/2010    REASON FOR VISIT:  Iron deficiency.    SUBJECTIVE:  [name] is a 59 year old female who was initially seen in the hospital for what was believed to be diverticulitis.  She had been on a course of antibiotics, which had led to some improvement.  However, she continued to have the left lower quadrant pain.  The pain has improved and only occurs on an intermittent basis.  However, the patient continues to have profound fatigue.  Blood work had been done.  The only significant finding was that her ferritin was at the low end of normal.  She was asked to take supplemental iron, which she has done.  In spite of that, a repeat ferritin level remains low at 17.  This is accompanied with clinical symptoms of cold intolerance, loss of hair, profound fatigue and sweats.      OBJECTIVE:  The blood pressure is 102/70.  Pulse is 80.      Skin:  Normal color and turgor.  HEENT:  Normal oral mucosa.  Lungs:  Clear.  Cardiovascular:  Regular rhythm, no murmurs or gallops.  Abdomen:  Soft, nontender, no masses and no organomegaly.  Bowel sounds normal.  Extremities:  No edema.    ASSESSMENT:  The cause of the patient's left lower quadrant pain remains uncertain.  I queried her about family history.  There is a very strong family history of colon cancer.  Both her mother and father have colon cancer and all eight siblings on the paternal side have a history of colon cancer.  The patient herself has had polyps removed.  I asked her about the genetic testing.  She indicated that she is interested in this.  I told her we need to obtain insurance authorization for this.    As related to her iron deficiency, she clearly is either bleeding slowly or having malabsorption as a trial of oral iron was unsuccessful.    PLAN:   1.  Arrange IV iron.  2.  Return to clinic in 2 weeks.      Daleen Snook, MD  Assistant Professor, [facility] Hematology Oncology  [phone]    [id number]/[id number]; D: 05/09/2010 11:12:49; T: 05/09/2010 12:20:37    cc: Orvil Feil MD      [facility] [address]      [place], [place] [address]            Jayme Cloud MD      [address]      [place], [place] [address]

## 2010-05-16 ENCOUNTER — Ambulatory Visit
Admission: RE | Admit: 2010-05-16 | Disposition: A | Discharge status: Home / Self Care | Payer: Self-pay | Source: Ambulatory Visit

## 2010-05-19 ENCOUNTER — Ambulatory Visit: Admission: RE | Admit: 2010-05-19 | Payer: Self-pay | Source: Ambulatory Visit | Admitting: Hematology & Oncology

## 2010-05-19 LAB — CBC
BASOPHIL #: 0.03 K/uL (ref 0.0–0.10)
BASOPHILS %: 0.8 % (ref 0–2.50)
EOSINOPHIL #: 0.17 K/uL (ref 0.00–0.50)
EOSINOPHIL %: 4.1 % (ref 0.0–5.2)
HCT: 38.5 % (ref 36.0–48.0)
HGB: 12.5 g/dL (ref 12.0–16.0)
LYMPHOCYTE #: 1.44 K/uL (ref 0.7–3.20)
LYMPHOCYTE %: 33.6 % (ref 15.0–43.0)
MCH: 30.5 pg (ref 28.3–34.3)
MCHC: 32.4 g/dL (ref 32.0–36.0)
MCV: 94.1 fL (ref 82.0–100.0)
MONOCYTE #: 0.49 K/uL (ref 0.20–0.90)
MONOCYTE %: 11.4 % (ref 4.8–12.0)
MPV: 8.8 fL (ref 7.4–10.45)
PLATELET COUNT: 167 K/uL (ref 150–400)
PMN #: 2.14 K/uL (ref 1.5–6.5)
PMN %: 50.1 % (ref 43.0–76.0)
RBC: 4.1 M/uL (ref 4.0–5.6)
RDW: 11.9 % (ref 11.0–16.0)
WBC: 4.3 K/uL (ref 4.0–11.0)

## 2010-05-19 LAB — COMPREHENSIVE METABOLIC PROFILE - BMC/JMC ONLY
ALBUMIN/GLOBULIN RATIO: 1.4
ALBUMIN: 3.9 g/dL (ref 3.5–5.0)
ALKALINE PHOSPHATASE: 52 IU/L (ref 38–126)
ALT (SGPT): 29 IU/L (ref 14–54)
ANION GAP: 5 mmol/L (ref 3–11)
AST (SGOT): 33 IU/L (ref 15–41)
BILIRUBIN, TOTAL: 0.5 mg/dL (ref 0.3–1.2)
BUN: 12 mg/dL (ref 6–20)
CALCIUM: 9.1 mg/dL (ref 8.6–10.3)
CARBON DIOXIDE: 29 mmol/L (ref 22–32)
CHLORIDE: 106 mmol/L (ref 101–111)
CREATININE: 0.51 mg/dL (ref 0.44–1.00)
ESTIMATED GLOMERULAR FILTRATION RATE: >60 mL/min (ref >60)
GLUCOSE: 85 mg/dL (ref 70–110)
POTASSIUM: 4.3 mmol/L (ref 3.4–5.1)
SODIUM: 140 mmol/L (ref 136–145)
TOTAL PROTEIN: 6.6 g/dL (ref 6.4–8.3)

## 2010-05-19 LAB — MONO TEST: MONOTEST: NEGATIVE

## 2010-05-20 ENCOUNTER — Ambulatory Visit: Admission: RE | Admit: 2010-05-20 | Payer: Self-pay | Source: Ambulatory Visit | Admitting: Hematology & Oncology

## 2010-05-23 ENCOUNTER — Encounter (INDEPENDENT_AMBULATORY_CARE_PROVIDER_SITE_OTHER): Payer: No Typology Code available for payment source | Admitting: Hematology & Oncology

## 2010-05-23 ENCOUNTER — Ambulatory Visit
Admission: RE | Admit: 2010-05-23 | Disposition: A | Discharge status: Home / Self Care | Payer: Self-pay | Source: Ambulatory Visit | Admitting: Gastroenterology

## 2010-05-24 ENCOUNTER — Encounter (INDEPENDENT_AMBULATORY_CARE_PROVIDER_SITE_OTHER): Payer: Self-pay | Admitting: Hematology & Oncology

## 2010-05-24 ENCOUNTER — Ambulatory Visit (INDEPENDENT_AMBULATORY_CARE_PROVIDER_SITE_OTHER): Payer: No Typology Code available for payment source | Admitting: Hematology & Oncology

## 2010-05-24 VITALS — BP 120/80 | HR 80 | Temp 98.4°F | Resp 20 | Ht 68.0 in | Wt 186.0 lb

## 2010-05-24 MED ORDER — BUTALBITAL-ASPIRIN-CAFFEINE 50 MG-325 MG-40 MG CAPSULE
1.00 | ORAL_CAPSULE | ORAL | Status: DC | PRN
Start: 2010-05-24 — End: 2011-05-02

## 2010-05-26 ENCOUNTER — Ambulatory Visit (INDEPENDENT_AMBULATORY_CARE_PROVIDER_SITE_OTHER): Payer: Self-pay | Admitting: Hematology & Oncology

## 2010-05-26 NOTE — Telephone Encounter (Signed)
Dr Lorrene Reid wanted pt to have genetic testing done for colon caner the colaris test Per Suzette Battiest at pt [employer] I got prior authorization for test. PA no is [id number] . Pt is schedule dec 27 at 10oclock

## 2010-05-31 ENCOUNTER — Ambulatory Visit (INDEPENDENT_AMBULATORY_CARE_PROVIDER_SITE_OTHER): Payer: No Typology Code available for payment source

## 2010-05-31 NOTE — Progress Notes (Signed)
Subjective:  59 year old female presents to clinic for follow up visit.  Patient has swelling in the lower abdomen for 2 months and would like to have her ovaries checked.  She has not had a prior ultrasound.  She has had back pain which concerns her.  She has iron deficiency anemia.  It was recommended that the patient follow up with gynecology.    Objective:  Vitals stable.  General: NAD.  CV: HRRR without m/r/g.  Resp:  LCTAB.   Abd: Soft, non tender, non distended, normal active bowel sounds. Ext:  No edema, equal pulses bilaterally.  Psych:  Non depressed mood, reactive affect.    Assessment:    Encounter Diagnoses   Code Name Primary?   . 780.79B Fatigue Yes   . 789.00AP Abdominal pain    . 724.5E Back pain        Plan:    Orders Placed This Encounter   . US KIDNEY   . OUTSIDE CONSULT/REFERRAL PROVIDER(AMB)   . AMB CONSULT/REF [facility]     All questions answered.  Patient understands and agrees with plan.  Level 3.

## 2010-07-02 ENCOUNTER — Ambulatory Visit: Admission: RE | Admit: 2010-07-02 | Payer: Self-pay | Source: Ambulatory Visit | Admitting: Hematology & Oncology

## 2010-07-02 LAB — CBC
BASOPHIL #: 0.05 K/uL (ref 0.0–0.10)
BASOPHILS %: 1.5 % (ref 0–2.50)
EOSINOPHIL #: 0.2 K/uL (ref 0.00–0.50)
EOSINOPHIL %: 6.2 % — ABNORMAL HIGH (ref 0.0–5.2)
HCT: 37.5 % (ref 36.0–48.0)
LYMPHOCYTE #: 1.16 K/uL (ref 0.7–3.20)
MCH: 31.4 pg (ref 28.3–34.3)
MCHC: 33.4 g/dL (ref 32.0–36.0)
MCV: 94 fL (ref 82.0–100.0)
MONOCYTE #: 0.37 K/uL (ref 0.20–0.90)
MONOCYTE %: 11.3 % (ref 4.8–12.0)
MPV: 8.7 fL (ref 7.4–10.45)
PLATELET COUNT: 141 K/uL — AB (ref 150–400)
PMN #: 1.5 K/uL (ref 1.5–6.5)
PMN %: 45.7 % (ref 43.0–76.0)
RBC: 3.99 M/uL — ABNORMAL LOW (ref 4.0–5.6)
RDW: 12 % (ref 11.0–16.0)
WBC: 3.3 K/uL — ABNORMAL LOW (ref 4.0–11.0)

## 2010-07-04 ENCOUNTER — Ambulatory Visit (INDEPENDENT_AMBULATORY_CARE_PROVIDER_SITE_OTHER): Payer: No Typology Code available for payment source | Admitting: Hematology & Oncology

## 2010-07-04 ENCOUNTER — Encounter (INDEPENDENT_AMBULATORY_CARE_PROVIDER_SITE_OTHER): Payer: Self-pay | Admitting: Hematology & Oncology

## 2010-07-04 LAB — COMPREHENSIVE METABOLIC PROFILE - BMC/JMC ONLY
ALBUMIN/GLOBULIN RATIO: 1.5
ALBUMIN: 3.9 g/dL (ref 3.5–5.0)
ALKALINE PHOSPHATASE: 41 IU/L (ref 38–126)
ALT (SGPT): 26 IU/L (ref 14–54)
ANION GAP: 3 mmol/L (ref 3–11)
AST (SGOT): 30 IU/L (ref 15–41)
BILIRUBIN, TOTAL: 0.9 mg/dL (ref 0.3–1.2)
BUN: 16 mg/dL (ref 6–20)
CALCIUM: 9.2 mg/dL (ref 8.8–10.2)
CARBON DIOXIDE: 29 mmol/L (ref 22–32)
CHLORIDE: 109 mmol/L (ref 101–111)
CREATININE: 0.54 mg/dL (ref 0.44–1.00)
ESTIMATED GLOMERULAR FILTRATION RATE: >60 mL/min (ref >60)
GLUCOSE: 83 mg/dL (ref 70–110)
POTASSIUM: 3.8 mmol/L (ref 3.4–5.1)
SODIUM: 141 mmol/L (ref 136–145)
TOTAL PROTEIN: 6.4 g/dL (ref 6.4–8.3)

## 2010-07-04 LAB — FERRITIN: FERRITIN: 41.4 ng/mL (ref 11.0–306.8)

## 2010-07-07 NOTE — Progress Notes (Signed)
[facility]  [address]  [place], [place] [address]    PROGRESS NOTE    PATIENT NAME: [name], [name]  CHART NUMBER: [id number]  DATE OF BIRTH: 03/05/1951  DATE OF SERVICE: 07/04/2010    REASON FOR VISIT:  Iron deficiency and fatigue.    SUBJECTIVE:  [name] is a 60 year old female who comes in today for continued care and follow up of her iron deficiency and her fatigue.  She had a momentary burst of energy when she was given intravenous iron.  However, she has now gone back to feeling fatigued.      In addition, she complains of dyspnea on exertion after short walks.  Upon further questioning, the patient recalls using Fen-Phen for weight loss in the 1980s.  The drug was removed from the market after it was determined to have cardiac effects.  Consequently, it is unclear whether her dyspnea on exertion is in some way related to this drug or not.  An echocardiogram is being ordered.  She is also complaining of waking up feeling fatigued.  I asked her husband about snoring, and he affirmed that she does snore.  She is also complaining of joint pains.    OBJECTIVE:  Blood pressure is 110/70.  The pulse is 72.      Skin:  Normal color and turgor.  HEENT:  Normal oral mucosa.  Lungs are clear.  Cardiovascular:  Regular rhythm, no murmurs or gallops.  Abdomen is soft, nontender, no masses, no organomegaly, bowel sounds normal.  Extremities:  No edema.    LABORATORY DATA:  Hemoglobin is 12.5.  White count is 4.3.  Ferritin is 41.  Genetic testing showed no evidence of genetic mutation for colorectal carcinoma.    ASSESSMENT:  1.  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  2.  Iron deficiency of uncertain etiology which has been corrected.  3.  Fatigue, which persists.    PLAN:  1.  Sleep study.  2.  Echocardiogram.  3.  Check additional blood work.      Daleen Snook, MD  Assistant Professor, [facility] Hematology Oncology  [phone]     [id number]/[id number]; D: 07/04/2010 14:59:38; T: 07/04/2010 15:59:32    cc: Orvil Feil MD      [facility] [address]      [place], [place] [address]

## 2010-07-11 ENCOUNTER — Ambulatory Visit: Admission: RE | Admit: 2010-07-11 | Payer: Self-pay | Source: Ambulatory Visit | Admitting: Hematology & Oncology

## 2010-07-11 LAB — C-REACTIVE PROTEIN(CRP),INFLAMMATION: C-REACTIVE PROTEIN (CRP),INFLAMMATION: <5 mg/L (ref <10)

## 2010-07-11 LAB — THYROXINE, FREE (FREE T4): THYROXINE, FREE (FREE T4): 0.73 ng/dL (ref 0.61–1.12)

## 2010-07-11 LAB — FOLATE: FOLATE: 13.6 ng/mL (ref >3.4)

## 2010-07-11 LAB — VITAMIN B12: VITAMIN B12: 679 pg/mL (ref 180–914)

## 2010-07-11 LAB — FREE T3 - BMC/JMC ONLY: FREE T3: 2.8 pg/mL (ref 2.5–3.9)

## 2010-07-11 LAB — TSH,3RD GENERATION: TSH 3RD GENERATION: 1.534 u[IU]/mL (ref 0.340–5.6)

## 2010-07-15 LAB — MTHFR DNA - BMC/JMC ONLY

## 2010-07-15 LAB — EPSTEIN-BARR ANTIBODY PANEL - BMC/JMC ONLY
EBV CAPSID AG,IGG AB: >5 {index} — AB (ref <=0.90)
EBV CAPSID AG,IGM AB: <=0.9 {index} (ref <=0.90)
EBV EARLY AG D ANTIBODY: 1.27 {index} — AB (ref <=0.90)
EPSTEIN BARR NUCLEAR AG, IGG: >5 {index} — AB (ref <=0.90)

## 2010-07-15 LAB — HEP-2 SUBSTRATE ANTINUCLEAR ANTIBODIES (ANA), SERUM: ANTI-NUCLEAR AB: NEGATIVE

## 2010-07-18 ENCOUNTER — Encounter (INDEPENDENT_AMBULATORY_CARE_PROVIDER_SITE_OTHER): Payer: Self-pay | Admitting: Hematology & Oncology

## 2010-07-18 ENCOUNTER — Ambulatory Visit: Admission: RE | Admit: 2010-07-18 | Payer: Self-pay | Source: Ambulatory Visit | Admitting: Hematology & Oncology

## 2010-07-18 ENCOUNTER — Ambulatory Visit (INDEPENDENT_AMBULATORY_CARE_PROVIDER_SITE_OTHER): Payer: No Typology Code available for payment source | Admitting: Hematology & Oncology

## 2010-07-18 MED ORDER — MELOXICAM 7.5 MG TABLET
7.5000 mg | ORAL_TABLET | Freq: Every day | ORAL | Status: DC
Start: 2010-07-18 — End: 2011-05-02

## 2010-07-20 NOTE — Progress Notes (Signed)
[facility]  [address]  [place], [place] [address]    PROGRESS NOTE    PATIENT NAME: [name], [name]  CHART NUMBER: [id number]  DATE OF BIRTH: 1950-09-22  DATE OF SERVICE: 07/18/2010    REASON FOR VISIT:  Fatigue and dyspnea.     SUBJECTIVE:  Ms. Lai comes in today to review the results of the various blood tests and laboratory testing, which were done to try and discriminate the causes of her fatigue and dyspnea.  The thyroid functions are normal.  ANA is normal.  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  EBV titers are elevated.     Because of the dyspnea, she had an echocardiogram.  The verbal report of the echocardiogram is that the patient has mitral valve prolapse.  The final report has not been issued.  This may explain her dyspnea on exertion.  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  Thyroid functions are normal.  Her last ferritin when checked after an iron infusion was also normal.    This was a counseling related discussion which lasted 25 minutes.  The patient did not wish to have a sleep study since her insurance company would not pay for one in the hospital, but instead wants it done at home.  The patient says she is not comfortable with having someone come in to her home.    I told the patient she should be referred to a cardiologist to assess her dyspnea on exertion, which is most likely related to her mitral valve prolapse  And mitral regurgitation.This may explain her dyspnea.    As related to her MTHFR gene, I described to her the cofactors which lead to an increased risk of clot.  Her sister, who is massively obese, has not had a clot.  The patient herself has never had a clot.       Finally, her fatigue may be related to EBV infection; her titers are elevated but she  says the fatigue is starting to resolve.      As related to her ferritin, she needs to have this monitored and will return here in two months' time.  All of her tests were answered.      Daleen Snook, MD  Assistant Professor, [facility] Hematology Oncology  [phone]    KG/[id number]; D: 07/18/2010 14:15:29; T: 07/18/2010 14:29:29    cc: Orvil Feil MD      [facility] [address]      [place], [place] [address]

## 2010-09-19 ENCOUNTER — Ambulatory Visit (INDEPENDENT_AMBULATORY_CARE_PROVIDER_SITE_OTHER): Payer: No Typology Code available for payment source | Admitting: Hematology & Oncology

## 2010-09-19 ENCOUNTER — Encounter (INDEPENDENT_AMBULATORY_CARE_PROVIDER_SITE_OTHER): Payer: Self-pay | Admitting: Hematology & Oncology

## 2010-09-19 MED ORDER — SUMATRIPTAN 100 MG TABLET
100.00 mg | ORAL_TABLET | Freq: Once | ORAL | Status: DC | PRN
Start: 2010-09-19 — End: 2010-12-21

## 2010-09-23 ENCOUNTER — Ambulatory Visit: Admission: RE | Admit: 2010-09-23 | Payer: Self-pay | Source: Ambulatory Visit

## 2010-09-23 LAB — CBC
BASOPHIL #: 0.04 K/uL (ref 0.0–0.10)
BASOPHILS %: 0.9 % (ref 0–2.50)
EOSINOPHIL #: 0.23 K/uL (ref 0.00–0.50)
EOSINOPHIL %: 5.2 % (ref 0.0–5.2)
HCT: 35.9 % — ABNORMAL LOW (ref 36.0–48.0)
HGB: 11.8 g/dL — ABNORMAL LOW (ref 12.0–16.0)
LYMPHOCYTE #: 1.42 K/uL (ref 0.7–3.20)
MCH: 31.5 pg (ref 28.3–34.3)
MCV: 96.1 fL (ref 82.0–100.0)
MONOCYTE %: 8 % (ref 4.8–12.0)
MPV: 9.3 fL (ref 7.4–10.45)
PLATELET COUNT: 175 K/uL — AB (ref 150–400)
PMN #: 2.45 K/uL (ref 1.5–6.5)
PMN %: 54.4 % (ref 43.0–76.0)
RBC: 3.74 M/uL — ABNORMAL LOW (ref 4.0–5.6)
RDW: 11.5 % (ref 11.0–16.0)
WBC: 4.5 K/uL (ref 4.0–11.0)

## 2010-09-24 ENCOUNTER — Ambulatory Visit: Admission: RE | Admit: 2010-09-24 | Payer: Self-pay | Source: Ambulatory Visit | Admitting: Hematology & Oncology

## 2010-09-24 LAB — HOMOCYSTEINE, TOTAL, PLASMA: HOMOCYSTEINE: 8.1 umol/L (ref <10.4)

## 2010-09-26 LAB — LIPID PANEL
CHOL/HDL RATIO: 2.4
CHOLESTEROL: 194 mg/dL (ref 120–199)
HDL-CHOLESTEROL: 81 mg/dL — ABNORMAL HIGH (ref 45–65)
LDL (CALCULATED): 104 mg/dL (ref <130)
TRIGLYCERIDES: 43 mg/dL (ref 35–135)
VLDL (CALCULATED): 9 mg/dL

## 2010-09-26 LAB — COMPREHENSIVE METABOLIC PROFILE - BMC/JMC ONLY
ALBUMIN/GLOBULIN RATIO: 1.4
ALBUMIN: 3.8 g/dL (ref 3.5–5.0)
ALKALINE PHOSPHATASE: 49 IU/L (ref 38–126)
ALT (SGPT): 19 IU/L (ref 14–54)
ANION GAP: 6 mmol/L (ref 3–11)
AST (SGOT): 26 IU/L (ref 15–41)
BILIRUBIN, TOTAL: 0.6 mg/dL (ref 0.3–1.2)
BUN: 15 mg/dL (ref 6–20)
CALCIUM: 9.2 mg/dL (ref 8.8–10.2)
CARBON DIOXIDE: 28 mmol/L (ref 22–32)
CHLORIDE: 106 mmol/L (ref 101–111)
CREATININE: 0.57 mg/dL (ref 0.44–1.00)
ESTIMATED GLOMERULAR FILTRATION RATE: >60 mL/min (ref >60)
GLUCOSE: 88 mg/dL (ref 70–110)
POTASSIUM: 4.1 mmol/L (ref 3.4–5.1)
SODIUM: 140 mmol/L (ref 136–145)
TOTAL PROTEIN: 6.5 g/dL (ref 6.4–8.3)

## 2010-09-26 LAB — EPSTEIN-BARR ANTIBODY PANEL - BMC/JMC ONLY
EBV CAPSID AG,IGG AB: >5 Index — AB (ref <=0.90)
EBV CAPSID AG,IGM AB: <=0.9 Index (ref <=0.90)
EBV EARLY AG D ANTIBODY: 1.54 Index — AB (ref <=0.90)
EPSTEIN BARR NUCLEAR AG, IGG: >5 Index — AB (ref <=0.90)

## 2010-09-26 LAB — FERRITIN
FERRITIN: 34.6 ng/mL (ref 11.0–306.8)
FERRITIN: 33.2 ng/mL (ref 11.0–306.8)

## 2010-09-28 NOTE — Progress Notes (Signed)
[facility]  [address]  [place], [place] [address]    PROGRESS NOTE    PATIENT NAME: [name], [name]  CHART NUMBER: [id number]  DATE OF BIRTH: Nov 01, 1950  DATE OF SERVICE: 09/19/2010    REASON FOR VISIT:  Fatigue.    SUBJECTIVE:  Ms. 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  She continues to have chronic fatigue.  She was worked up and found to have mitral valve prolapse.  She saw Dr. Romeo Apple.  She indicates that he felt her dyspnea on exertion was probably not related to mitral valve prolapse and is doing further testing at this time.  Her fatigue is believed due to EBV infection and she says it is improved.     She did not have a sleep study, which was ordered due to her concern about having someone do the sleep study in her home.  She has now agreed to have it done.  She says she wakes up with migraines every morning from not sleeping well. Her daughter states that she snores, consistent with the possibility of sleep apnea.     OBJECTIVE:  Blood pressure is 120/70, pulse is 72.      Skin:  Normal color and turgor.  HEENT:  Normal oral mucosa.  Lungs are clear.  Cardiovascular:  Regular rhythm, no murmurs or gallops.  Abdomen is soft, nontender, bowel sounds normal.  Extremities:  No edema.    ASSESSMENT:  1.  Fatigue, possibly related to iron deficiency or EBV.  2.  Dyspnea on exertion.  3.  Migraines.    PLAN:  1.  The patient is to arrange a sleep study.   2.  The patient is to follow up with Dr. Romeo Apple.   3.  The patient is to have laboratory work done today to determine her ferritin level.  4.  Return to clinic in 6 weeks.    POST MTG NOTE  FERRITIN NL; EBV TITERS STILL ELEVATED (PT NOTIFIED THAT TITER DOES NOT DISCRIMINATE FROM ACUTE VS CHRONIC INFECTION)     STILL SHORT OF BREATHE; REQUESTING REFERRAL TO PULMONARY NOW THAT CARDIOLOGY HAS EXCLUDED VALULAR DISEASE AS CAUSE OF DYSYPNEA    PT HAS NOT YET DONE SLEEP TEST    Daleen Snook, MD  Assistant Professor, [facility] Hematology Oncology  [phone]    [id number]/[id number]; D: 09/19/2010 15:36:41; T: 09/19/2010 19:21:35    cc:       Tarri Abernethy, MD              Jamse Belfast MD      [facility] [address]      [place], [place] [address]

## 2010-10-24 ENCOUNTER — Ambulatory Visit (INDEPENDENT_AMBULATORY_CARE_PROVIDER_SITE_OTHER): Payer: No Typology Code available for payment source | Admitting: Internal Medicine

## 2010-11-07 ENCOUNTER — Ambulatory Visit
Admission: RE | Admit: 2010-11-07 | Discharge: 2010-11-07 | Payer: Self-pay | Source: Ambulatory Visit | Attending: Hematology & Oncology | Admitting: Hematology & Oncology

## 2010-11-07 LAB — FERRITIN: FERRITIN: 16 ng/mL (ref 11.0–306.8)

## 2010-11-07 LAB — CBC
HCT: 38.2 % (ref 36.0–48.0)
MCH: 31.1 pg (ref 28.3–34.3)
MCV: 96.6 fL (ref 82.0–100.0)
MPV: 8.6 fL (ref 7.4–10.45)
PLATELET COUNT: 186 K/uL (ref 150–400)
RBC: 3.95 M/uL — ABNORMAL LOW (ref 4.0–5.6)
RDW: 12.1 % (ref 11.0–16.0)
WBC: 5 K/uL (ref 4.0–11.0)

## 2010-11-07 LAB — MANUAL DIFFERENTIAL - BMC/JMC ONLY
BASOPHILS: 1 % (ref 0–2.5)
EOSINOPHIL: 5 % (ref 0.0–5.2)
LYMPHOCYTES: 32 % (ref 15.0–43.0)
MONOCYTES: 4 % — ABNORMAL LOW (ref 4.8–12.0)
PLATELET ESTIMATE: ADEQUATE
PMN'S: 58 % (ref 43.0–76.0)

## 2010-11-08 ENCOUNTER — Encounter (INDEPENDENT_AMBULATORY_CARE_PROVIDER_SITE_OTHER): Payer: Self-pay | Admitting: Hematology & Oncology

## 2010-11-08 ENCOUNTER — Ambulatory Visit (INDEPENDENT_AMBULATORY_CARE_PROVIDER_SITE_OTHER): Payer: No Typology Code available for payment source | Admitting: Hematology & Oncology

## 2010-11-08 VITALS — BP 100/60 | HR 60 | Temp 98.5°F | Resp 18 | Ht 68.0 in | Wt 201.6 lb

## 2010-11-09 NOTE — Progress Notes (Signed)
[facility]  [address]  [place], [place] [address]    PROGRESS NOTE    PATIENT NAME: [name], [name]  CHART NUMBER: [id number]  DATE OF BIRTH: 1950-11-03  DATE OF SERVICE: 11/08/2010    REASON FOR VISIT:  Iron deficiency.    SUBJECTIVE:  [name] comes in today for continued care and followup of her iron deficiency.  The patient has fatigue, which has resolved.  The patient says that her energy level has suddenly increased.  She is also undergoing a cardiac evaluation.  She was exposed to Phen-Fen in the 1980s for weight loss.  She said that she does have a history of cardiac abnormalities secondary to the drug.  She comes today for followup of her iron deficiency.  She has no pica.    OBJECTIVE:  The blood pressure is 100/60.  Pulse is 60.  Skin:  Normal color and turgor.  HEENT:  Normal oral mucosa.  Lungs are clear.  Cardiovascular:  Regular rhythm, no murmurs or gallops.  Abdomen is soft, nontender, no masses.  Bowel sounds normal.  Extremities:  No edema.    LABORATORY DATA:  CBC is normal.  Ferritin is 16.    ASSESSMENT:  The patient again has iron deficiency.    PLAN:  1.  Arrange IV iron due to prior history of nonabsorption of oral iron.  2.  The patient is to follow up with her cardiologist.  3.  Return to clinic in 6 weeks.      Daleen Snook, MD  Assistant Professor, [facility] Hematology Oncology  [phone]    DG/LO/[id number]; D: 11/08/2010 15:10:50; T: 11/09/2010 10:19:01    cc: Orvil Feil MD      [facility] [address]      [place], [place] [address]

## 2010-11-15 ENCOUNTER — Ambulatory Visit
Admission: RE | Admit: 2010-11-15 | Discharge: 2010-11-15 | Payer: Self-pay | Source: Ambulatory Visit | Attending: Hematology & Oncology | Admitting: Hematology & Oncology

## 2010-12-19 ENCOUNTER — Ambulatory Visit
Admission: RE | Admit: 2010-12-19 | Discharge: 2010-12-19 | Payer: Self-pay | Source: Ambulatory Visit | Attending: Hematology & Oncology | Admitting: Hematology & Oncology

## 2010-12-19 LAB — CBC
BASOPHIL #: 0.06 K/uL (ref 0.0–0.10)
BASOPHILS %: 1.2 % (ref 0–2.50)
EOSINOPHIL #: 0.23 K/uL (ref 0.00–0.50)
EOSINOPHIL %: 4.8 % (ref 0.0–5.2)
HCT: 40.2 % (ref 36.0–48.0)
HGB: 13.4 g/dL (ref 12.0–16.0)
LYMPHOCYTE #: 1.77 K/uL (ref 0.7–3.20)
LYMPHOCYTE %: 37.2 % (ref 15.0–43.0)
MCH: 31.3 pg (ref 28.3–34.3)
MCHC: 33.2 g/dL (ref 32.0–36.0)
MCV: 94 fL (ref 82.0–100.0)
MONOCYTE %: 8.7 % (ref 4.8–12.0)
MPV: 9 fL (ref 7.4–10.45)
PLATELET COUNT: 156 K/uL — AB (ref 150–400)
PMN #: 2.28 K/uL (ref 1.5–6.5)
PMN %: 48 % (ref 43.0–76.0)
RBC: 4.28 M/uL (ref 4.0–5.6)
RDW: 11.8 % (ref 11.0–16.0)
WBC: 4.8 K/uL (ref 4.0–11.0)

## 2010-12-19 LAB — FERRITIN: FERRITIN: 34 ng/mL (ref 11.0–306.8)

## 2010-12-21 ENCOUNTER — Ambulatory Visit (INDEPENDENT_AMBULATORY_CARE_PROVIDER_SITE_OTHER): Payer: No Typology Code available for payment source | Admitting: Hematology & Oncology

## 2010-12-21 ENCOUNTER — Encounter (INDEPENDENT_AMBULATORY_CARE_PROVIDER_SITE_OTHER): Payer: Self-pay | Admitting: Hematology & Oncology

## 2010-12-21 VITALS — BP 110/60 | HR 72 | Temp 97.4°F | Resp 14 | Wt 204.0 lb

## 2010-12-21 MED ORDER — HYDROMORPHONE 2 MG TABLET
2.00 mg | ORAL_TABLET | ORAL | Status: DC | PRN
Start: 2010-12-21 — End: 2011-05-02

## 2010-12-21 MED ORDER — PROPRANOLOL 10 MG TABLET
10.00 mg | ORAL_TABLET | Freq: Three times a day (TID) | ORAL | Status: DC
Start: 2010-12-21 — End: 2011-02-28

## 2010-12-21 MED ORDER — SUMATRIPTAN 100 MG TABLET
100.0000 mg | ORAL_TABLET | Freq: Once | ORAL | Status: DC | PRN
Start: 2010-12-21 — End: 2011-07-05

## 2010-12-22 NOTE — Progress Notes (Signed)
[facility]  [address]  [place], [place] [address]    PROGRESS NOTE    PATIENT NAME: [name], [name]  CHART NUMBER: [id number]  DATE OF BIRTH: 1950/10/01  DATE OF SERVICE: 12/21/2010    REASON FOR VISIT:  Iron deficiency.    SUBJECTIVE:  [name] comes in today for followup of her iron deficiency.  However, she is in extreme pain.  She currently has a migraine headache precipitated by the recent heat wave.  She had previously been given a prescription for Fiorinal to help with the pain.  She says it does help somewhat.  However, she says that because of the extreme heat and humidity, she is bedridden.    OBJECTIVE:  The patient was not examined due to inability to move.  The room was darkened.  I suggested we reassess her reason for visit at a future date.  However, her laboratory work is normal, including her ferritin. She was not examined.     ASSESSMENT:  1.  Severe migraine.  2.  Iron deficiency by history, which has now been corrected.    PLAN:  1.  The patient requesting referral to neurology.  2.  Renew Imitrex.  3.  Prescription for Inderal for preventative measures.  4.  Prescription for Dilaudid to control migraine pain.      Daleen Snook, MD  Assistant Professor, [facility] Hematology Oncology  [phone]    XB/JY/[id number]; D: 12/21/2010 16:37:26; T: 12/22/2010 11:59:29

## 2011-02-03 ENCOUNTER — Ambulatory Visit
Admission: RE | Admit: 2011-02-03 | Discharge: 2011-02-03 | Disposition: A | Discharge status: Home / Self Care | Payer: No Typology Code available for payment source | Source: Ambulatory Visit | Attending: Neurology | Admitting: Neurology

## 2011-02-03 DIAGNOSIS — G43909 Migraine, unspecified, not intractable, without status migrainosus: Secondary | ICD-10-CM | POA: Insufficient documentation

## 2011-02-03 LAB — SEDIMENTATION RATE: SEDIMENTATION RATE: 5 mm/hr (ref 0–30)

## 2011-02-15 ENCOUNTER — Encounter (INDEPENDENT_AMBULATORY_CARE_PROVIDER_SITE_OTHER): Payer: No Typology Code available for payment source | Admitting: Hematology & Oncology

## 2011-02-27 ENCOUNTER — Ambulatory Visit
Admission: RE | Admit: 2011-02-27 | Discharge: 2011-02-27 | Disposition: A | Discharge status: Home / Self Care | Payer: No Typology Code available for payment source | Source: Ambulatory Visit | Attending: Hematology & Oncology | Admitting: Hematology & Oncology

## 2011-02-27 ENCOUNTER — Encounter (INDEPENDENT_AMBULATORY_CARE_PROVIDER_SITE_OTHER): Payer: No Typology Code available for payment source | Admitting: Hematology & Oncology

## 2011-02-27 DIAGNOSIS — D509 Iron deficiency anemia, unspecified: Secondary | ICD-10-CM | POA: Insufficient documentation

## 2011-02-27 LAB — CBC
BASOPHIL #: 0.05 K/uL (ref 0.0–0.10)
BASOPHILS %: 0.9 % (ref 0–2.50)
EOSINOPHIL #: 0.13 K/uL (ref 0.00–0.50)
EOSINOPHIL %: 2.6 % (ref 0.0–5.2)
HCT: 35.7 % — ABNORMAL LOW (ref 36.0–48.0)
HGB: 12 g/dL (ref 12.0–16.0)
LYMPHOCYTE #: 1.56 K/uL (ref 0.7–3.20)
LYMPHOCYTE %: 30.2 % (ref 15.0–43.0)
MCH: 31.4 pg (ref 28.3–34.3)
MCHC: 33.7 g/dL (ref 32.0–36.0)
MCV: 93.2 fL (ref 82.0–100.0)
MONOCYTE %: 8.1 % (ref 4.8–12.0)
MPV: 9.6 fL (ref 7.4–10.45)
PLATELET COUNT: 164 K/uL (ref 150–400)
PMN #: 3 K/uL (ref 1.5–6.5)
PMN %: 58.2 % (ref 43.0–76.0)
RBC: 3.83 M/uL — ABNORMAL LOW (ref 4.0–5.6)
RDW: 11.9 % (ref 11.0–16.0)
WBC: 5.1 K/uL (ref 4.0–11.0)

## 2011-02-27 LAB — FERRITIN: FERRITIN: 45.1 ng/mL (ref 11.0–306.8)

## 2011-02-28 ENCOUNTER — Ambulatory Visit (INDEPENDENT_AMBULATORY_CARE_PROVIDER_SITE_OTHER): Payer: No Typology Code available for payment source | Admitting: Hematology & Oncology

## 2011-02-28 ENCOUNTER — Encounter (INDEPENDENT_AMBULATORY_CARE_PROVIDER_SITE_OTHER): Payer: Self-pay | Admitting: Hematology & Oncology

## 2011-02-28 NOTE — Progress Notes (Signed)
 [facility]  [address]  [place], [place] [address]    PROGRESS NOTE    PATIENT NAME: [name], [name]  CHART NUMBER: [id number]  DATE OF BIRTH: 1951-04-23  DATE OF SERVICE: 02/28/2011    REASON FOR VISIT:  Anemia.    SUBJECTIVE:  Mrs. Bottomley comes in today for routine followup of her iron deficiency anemia.  The exact cause has never been found.  The patient had had left lower quadrant pain a year ago, which has now disappeared.  In the interim, she did require IV iron, which corrected her iron deficiency.  She has had endoscopies.  She does not absorb oral iron.  Her energy level continues to be problematic and she also has seen Dr. Varga for evaluation of headaches.  The exact cause is uncertain.  She has a history of migraine.  She is currently on Topamax , which she indicates has only partially helped.  She has pain in a circumscribed area over the vertex of the head.    OBJECTIVE:  Blood pressure is 110/64.  Pulse is 76.      Skin:  Normal color and turgor.    HEENT:  Normal oral mucosa.    Lungs:  Clear.  Cardiovascular:  Regular rhythm.  No murmurs or gallops.    Abdomen:  Soft, nontender.    Extremities:  No edema.  Musculoskeletal:  No bony tenderness.    Neurologic:  No focal deficits.    LABORATORY DATA:  White count 5, hemoglobin 12, platelet count 164,000, MCV is 93, ferritin is 45.    ASSESSMENT:    1.  Iron deficiency, which has resolved.  2.  Left lower quadrant pain, which has resolved.  3.  Headaches.    PLAN:  1.  Routine followup in 2 months for iron deficiency.  2.  The patient is to follow up with Dr. Varga for her headaches.      Camellia Krill, MD  Assistant Professor, [facility] Hematology Oncology  [phone]    ZA/[id number]; D: 02/28/2011 13:49:58; T: 02/28/2011 14:03:44    cc: Carlie Mean MD      [address]       [place], [place] [address]

## 2011-04-06 ENCOUNTER — Other Ambulatory Visit (HOSPITAL_BASED_OUTPATIENT_CLINIC_OR_DEPARTMENT_OTHER): Payer: Self-pay | Admitting: Neurology

## 2011-04-13 ENCOUNTER — Ambulatory Visit (HOSPITAL_BASED_OUTPATIENT_CLINIC_OR_DEPARTMENT_OTHER)
Admission: RE | Admit: 2011-04-13 | Discharge: 2011-04-13 | Disposition: A | Discharge status: Home / Self Care | Payer: No Typology Code available for payment source | Source: Ambulatory Visit | Attending: Neurology | Admitting: Neurology

## 2011-04-13 ENCOUNTER — Ambulatory Visit (HOSPITAL_BASED_OUTPATIENT_CLINIC_OR_DEPARTMENT_OTHER): Payer: No Typology Code available for payment source

## 2011-04-13 DIAGNOSIS — G509 Disorder of trigeminal nerve, unspecified: Secondary | ICD-10-CM | POA: Insufficient documentation

## 2011-04-13 DIAGNOSIS — R51 Headache: Secondary | ICD-10-CM | POA: Insufficient documentation

## 2011-04-25 ENCOUNTER — Encounter (INDEPENDENT_AMBULATORY_CARE_PROVIDER_SITE_OTHER): Payer: Self-pay | Admitting: Family Medicine

## 2011-04-25 ENCOUNTER — Ambulatory Visit (INDEPENDENT_AMBULATORY_CARE_PROVIDER_SITE_OTHER): Payer: No Typology Code available for payment source | Admitting: Family Medicine

## 2011-04-25 VITALS — BP 110/64 | HR 80 | Temp 97.0°F | Resp 20 | Ht 66.0 in | Wt 198.0 lb

## 2011-04-25 MED ORDER — AZITHROMYCIN 250 MG TABLET
ORAL_TABLET | ORAL | Status: AC
Start: 2011-04-25 — End: 2011-04-30

## 2011-04-25 NOTE — Progress Notes (Signed)
BP 110/64   Pulse 80   Temp(Src) 36.1 C (97 F) (Oral)   Resp 20   Ht 1.676 m (5\' 6" )   Wt 89.812 kg (198 lb)   BMI 31.96 kg/m2

## 2011-04-25 NOTE — Progress Notes (Signed)
SUBJECTIVE:   [name] is a 60 y.o. female who complains of sinus and nasal congestion, shortness of breath and lower rib pain with deep breathing for 10-14 days. She denies a history of sweats, chills and fevers and denies a history of asthma. Patient does not smoke cigarettes.     OBJECTIVE:  She appears mild distress, pulse Ox is 98%, vital signs are as noted by the nurse. Ears normal.  Throat and pharynx normal.  Neck supple. No adenopathy in the neck. Nose is congested. Sinuses non-tender. The chest is clear, without wheezes or rales.    ASSESSMENT:   bronchitis    PLAN:  Azithromycin x 5 days  Symptomatic therapy suggested: push fluids, rest and ROV prn if symptoms persist or worsen. Call or return to clinic prn if these symptoms worsen or fail to improve as anticipated.

## 2011-04-26 ENCOUNTER — Encounter (INDEPENDENT_AMBULATORY_CARE_PROVIDER_SITE_OTHER): Payer: Self-pay | Admitting: Family Medicine

## 2011-04-26 NOTE — Progress Notes (Signed)
Patient seen at urgent care 04/25/11. Called to follow up and patient states taking antibiotic and advised by Dr.Roelkey to come back in 4 days if not getting better. I also advised patient to call if questions/concerns.

## 2011-05-01 ENCOUNTER — Ambulatory Visit
Admission: RE | Admit: 2011-05-01 | Discharge: 2011-05-01 | Disposition: A | Discharge status: Home / Self Care | Payer: No Typology Code available for payment source | Source: Ambulatory Visit | Attending: Hematology & Oncology | Admitting: Hematology & Oncology

## 2011-05-01 DIAGNOSIS — R5381 Other malaise: Secondary | ICD-10-CM | POA: Insufficient documentation

## 2011-05-01 DIAGNOSIS — D509 Iron deficiency anemia, unspecified: Secondary | ICD-10-CM | POA: Insufficient documentation

## 2011-05-01 LAB — COMPREHENSIVE METABOLIC PROFILE - BMC/JMC ONLY
ALKALINE PHOSPHATASE: 49 IU/L (ref 38–126)
ESTIMATED GLOMERULAR FILTRATION RATE: >60 mL/min (ref >60)

## 2011-05-01 LAB — CBC
BASOPHILS %: 0.7 % (ref 0–2.50)
EOSINOPHIL #: 0.14 K/uL (ref 0.00–0.50)
LYMPHOCYTE #: 1.33 K/uL (ref 0.7–3.20)
LYMPHOCYTE %: 34.5 % (ref 15.0–43.0)
MCH: 31.3 pg (ref 28.3–34.3)
MCHC: 33.6 g/dL (ref 32.0–36.0)
MPV: 9 fL (ref 7.4–10.45)
RDW: 11.6 % (ref 11.0–16.0)

## 2011-05-01 LAB — SEDIMENTATION RATE: SEDIMENTATION RATE: 6 mm/h (ref 0–30)

## 2011-05-02 ENCOUNTER — Ambulatory Visit (INDEPENDENT_AMBULATORY_CARE_PROVIDER_SITE_OTHER): Payer: No Typology Code available for payment source | Admitting: Hematology & Oncology

## 2011-05-02 ENCOUNTER — Encounter (INDEPENDENT_AMBULATORY_CARE_PROVIDER_SITE_OTHER): Payer: Self-pay | Admitting: Hematology & Oncology

## 2011-05-02 VITALS — BP 140/80 | HR 72 | Temp 98.2°F | Resp 18 | Ht 67.0 in | Wt 200.0 lb

## 2011-05-02 LAB — HEP-2 SUBSTRATE ANTINUCLEAR ANTIBODIES (ANA), SERUM: ANTI-NUCLEAR AB: NEGATIVE

## 2011-05-02 NOTE — Progress Notes (Signed)
[facility]  [address]  [place], [place] [address]    PROGRESS NOTE    PATIENT NAME: [name], [name]  CHART NUMBER: [id number]  DATE OF BIRTH: 1950/10/26  DATE OF SERVICE: 05/02/2011    REASON FOR VISIT:  Anemia.    SUBJECTIVE:  Ms. Gauna is a 60 year old female who has been seen at this clinic over the course of the last several months for several different problems.  She has iron deficiency of uncertain etiology.  She has intermittently required intravenous iron.      She also has dyspnea on exertion, which is unexplained.  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  Her dyspnea however is unexplained.  Previously, I had suggested to her a pulmonary function test and pulmonary consult.  She canceled it.  She continues to have dyspnea on exertion.  She has an underlying MTHFR mutation and I suggested the possibility of thromboembolic clots to the lung.  She has agreed to have a CT angiogram.    OBJECTIVE:  The blood pressure is 140/80.  Pulse is 72.  O2 saturation 98% on room air.      Skin:  Normal color and turgor.    HEENT:  Normal oral mucosa.    Lungs:  Clear.  No rales, rhonchi or wheezes.    Cardiovascular:  Regular rhythm.    Abdomen:  Soft, nontender, no masses.  Bowel sounds normal.    Extremities:  No edema.    LABORATORY DATA:  White count 3.9, hemoglobin 11.8, platelet count 161.  Ferritin 34.    ASSESSMENT:  1.  Anemia, which is stable.  2.  Dyspnea on exertion, which is unexplained.    PLAN:  1.  CT angiogram.  2.  Pulmonary function tests.  3.  Referral to pulmonary medicine.  4.  Repeat echocardiogram.  5.  Return to clinic after CT angiogram completed.      Daleen Snook, MD  Assistant Professor, [facility]  [phone]    FA/OZ/[id number]; D: 05/02/2011 15:37:44; T: 05/02/2011 17:45:27    cc: Caprice Renshaw MD      [facility] [address]      [place], [place] [address]

## 2011-05-05 ENCOUNTER — Other Ambulatory Visit (INDEPENDENT_AMBULATORY_CARE_PROVIDER_SITE_OTHER): Payer: Self-pay | Admitting: Hematology & Oncology

## 2011-05-05 ENCOUNTER — Ambulatory Visit (INDEPENDENT_AMBULATORY_CARE_PROVIDER_SITE_OTHER): Payer: Self-pay | Admitting: Hematology & Oncology

## 2011-05-05 NOTE — Telephone Encounter (Signed)
Per Toney Sang at [facility] [employer] no pre cert is required for ct angiogram of chest CPT code 16109 or for the mammogram or echo. Pt has been informed of test times which is Dec. 5th  945 am for mammogram then 1015 am angiogram the 11:15 echo. Pt has been informed to pre regrister . Pt verbally understood

## 2011-05-08 ENCOUNTER — Ambulatory Visit (INDEPENDENT_AMBULATORY_CARE_PROVIDER_SITE_OTHER): Payer: Self-pay | Admitting: Hematology & Oncology

## 2011-05-08 NOTE — Telephone Encounter (Signed)
Pt has been informed of ct angiogram echo and mammogram all scheduled on dec 5th  Starting at 9;45 am pt has been informed to call and pre register for appointments. Pt verbally understood her instructions

## 2011-05-09 ENCOUNTER — Encounter (INDEPENDENT_AMBULATORY_CARE_PROVIDER_SITE_OTHER): Payer: Self-pay | Admitting: Internal Medicine

## 2011-05-09 ENCOUNTER — Ambulatory Visit (INDEPENDENT_AMBULATORY_CARE_PROVIDER_SITE_OTHER): Payer: No Typology Code available for payment source | Admitting: Internal Medicine

## 2011-05-09 DIAGNOSIS — E611 Iron deficiency: Secondary | ICD-10-CM

## 2011-05-09 DIAGNOSIS — I2721 Secondary pulmonary arterial hypertension: Secondary | ICD-10-CM

## 2011-05-09 DIAGNOSIS — R0600 Dyspnea, unspecified: Secondary | ICD-10-CM

## 2011-05-09 DIAGNOSIS — I2789 Other specified pulmonary heart diseases: Secondary | ICD-10-CM

## 2011-05-09 DIAGNOSIS — R0609 Other forms of dyspnea: Secondary | ICD-10-CM

## 2011-05-09 NOTE — Progress Notes (Signed)
Subjective:      [name] is an 60 y.o. Caucasian female referred by Dr. Lorrene Reid for evaluation and treatment of shortness of breath at rest, any activity especially when ascending steps or a hill..  Symptoms include  difficulty breathing.  Symptoms began several months ago, gradually worsening since.  Patient denies difficulty breathing and dyspnea on exertion. Associated symptoms include dyspnea. Patient does not have had recent travel.  Weight has been stable.  Appetite has been unchanged. Symptoms are exacerbated by any exercise. Symptoms are alleviated by rest.     Previous Report(s) Reviewed: office notes, radiology reports, xray reports     Past Medical History   Diagnosis Date   . Breast lump 2005     Resolved prior to biopsy   . LLQ abdominal pain    . Hx of migraines    . Iron deficiency anemia    . Pancytopenia      Past Surgical History   Procedure Date   . Hx cesarean section 1993   . Hx tonsillectomy 1955   . Hx colonoscopy    . Hx endoscopic sinus surgery      Family History   Problem Relation Age of Onset   . Congestive Heart Failure Mother    . Colon Cancer Mother    . Asthma Mother    . Diabetes Father    . Colon Cancer Father    . Asthma Sister    . Asthma Brother      Current Outpatient Prescriptions   Medication Sig Dispense Refill   . Topiramate (TOPAMAX) 50 mg Oral Tablet take 50 mg by mouth Twice daily.         . Sumatriptan Succinate (IMITREX) 100 mg Oral Tablet take 1 Tab by mouth Once, as needed for Migraine for 1 dose. May repeat in 2 hours in needed  60 Tab  0   . multivitamin Oral Tablet take 1 Tab by mouth Once a day.         Allergies   Allergen Reactions   . Percocet (Oxycodone-Acetaminophen) Nausea/ Vomiting     History     Social History   . Marital Status: Married     Spouse Name: N/A     Number of Children: N/A   . Years of Education: N/A     Occupational History   . 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 Smoking status: Never Smoker    . Smokeless tobacco: Not on file   . Alcohol Use: No   . Drug Use: No   . Sexually Active: Yes -- Female partner(s)     Other Topics Concern   . Not on file     Social History Narrative   . No narrative on file       Review of Systems    Constitutional: she was on Fen/Phen for several months and received a settlement form this.  Eyes: wears glasses  Ears, nose, mouth, throat, and face: negative for ear drainage, nasal congestion and voice change  Respiratory: positive for cough, wheezing or dyspnea on exertion  Cardiovascular: positive for chest pressure/discomfort and dyspnea  Gastrointestinal: negative  Genitourinary:deferred  Integument/breast: negative  Hematologic/lymphatic: positive for easy bruising  Musculoskeletal:negative for arthralgias  Neurological: positive for headaches  Behavioral/Psych: negative  Endocrine: negative  Allergic/Immunologic: negative allergy testing      Objective:     BP  99/64   Pulse 70   Temp(Src) 36.8 C (98.3 F) (Oral)   Ht 1.702 m (5\' 7" )   Wt 89.812 kg (198 lb)   BMI 31.01 kg/m2   SpO2 99%  General appearance: alert, cooperative, no distress, appears stated age  Head: Normocephalic, without obvious abnormality, atraumatic  Neck: supple, symmetrical, trachea midline, no adenopathy and thyroid: not enlarged, symmetric, no tenderness/mass/nodules  Back: no kyphosis present, no scoliosis present  Lungs: clear to auscultation bilaterally  Heart: regular rate and rhythm, S1, S2 normal, no murmur, click, rub or gallop  Extremities: extremities normal, atraumatic, no cyanosis or edema  Skin: Skin color, texture, turgor normal. No rashes or lesions  Neurologic: Grossly normal    Diagnostic Review  Echocardiogram date: 2011, noted    Assessment:     1.  History of pancytopenia.  2.  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   3.  Dyspnea of unclear etiology.  Large differential including:  Obstructive lung disease, such as asthma, diffusion problem due anemia.  Also consider cardiac causes such as PAH.  Less likely would be deconditioning as well as atypical CAD.    Plan:   Chest CT.  Pulmonary function tests.  Follow-up in 2 weeks, sooner should new symptoms or problems arise.  Check an ECHO  6 MWT with next visit

## 2011-05-10 ENCOUNTER — Ambulatory Visit (HOSPITAL_BASED_OUTPATIENT_CLINIC_OR_DEPARTMENT_OTHER)
Admission: RE | Admit: 2011-05-10 | Discharge: 2011-05-10 | Disposition: A | Discharge status: Home / Self Care | Payer: No Typology Code available for payment source | Source: Ambulatory Visit | Attending: Hematology & Oncology | Admitting: Hematology & Oncology

## 2011-05-10 DIAGNOSIS — R5381 Other malaise: Secondary | ICD-10-CM | POA: Insufficient documentation

## 2011-05-10 MED ORDER — IOVERSOL 350 MG IODINE/ML INTRAVENOUS SOLUTION
100.00 mL | INTRAVENOUS | Status: AC
Start: 2011-05-10 — End: 2011-05-10
  Administered 2011-05-10: 75 mL via INTRAVENOUS
  Filled 2011-05-10: qty 100

## 2011-05-12 ENCOUNTER — Ambulatory Visit (INDEPENDENT_AMBULATORY_CARE_PROVIDER_SITE_OTHER): Payer: Self-pay | Admitting: Hematology & Oncology

## 2011-05-12 ENCOUNTER — Other Ambulatory Visit (INDEPENDENT_AMBULATORY_CARE_PROVIDER_SITE_OTHER): Payer: Self-pay | Admitting: Hematology & Oncology

## 2011-05-12 NOTE — Telephone Encounter (Signed)
Pt has been informed of ultra and mammogram pt has already called and pre register.

## 2011-05-17 ENCOUNTER — Ambulatory Visit (HOSPITAL_BASED_OUTPATIENT_CLINIC_OR_DEPARTMENT_OTHER): Payer: No Typology Code available for payment source

## 2011-05-17 ENCOUNTER — Encounter (HOSPITAL_BASED_OUTPATIENT_CLINIC_OR_DEPARTMENT_OTHER): Payer: No Typology Code available for payment source

## 2011-05-17 ENCOUNTER — Ambulatory Visit (HOSPITAL_BASED_OUTPATIENT_CLINIC_OR_DEPARTMENT_OTHER)
Admission: RE | Admit: 2011-05-17 | Discharge: 2011-05-17 | Disposition: A | Discharge status: Home / Self Care | Payer: No Typology Code available for payment source | Source: Ambulatory Visit | Attending: Internal Medicine | Admitting: Internal Medicine

## 2011-05-17 DIAGNOSIS — R0609 Other forms of dyspnea: Secondary | ICD-10-CM | POA: Insufficient documentation

## 2011-05-17 MED ORDER — METHACHOLINE CHLORIDE 100 MG SOLUTION FOR INHALATION
1.0000 | Freq: Once | RESPIRATORY_TRACT | Status: AC
Start: 2011-05-17 — End: 2011-05-17
  Administered 2011-05-17: 1 via RESPIRATORY_TRACT
  Filled 2011-05-17: qty 1

## 2011-05-17 MED ORDER — ALBUTEROL SULFATE CONCENTRATE 2.5 MG/0.5 ML SOLUTION FOR NEBULIZATION
2.5000 mg | INHALATION_SOLUTION | RESPIRATORY_TRACT | Status: DC | PRN
Start: 2011-05-17 — End: 2011-05-18
  Administered 2011-05-17: 2.5 mg via RESPIRATORY_TRACT
  Filled 2011-05-17: qty 1

## 2011-05-18 ENCOUNTER — Other Ambulatory Visit (HOSPITAL_BASED_OUTPATIENT_CLINIC_OR_DEPARTMENT_OTHER): Payer: Self-pay

## 2011-05-18 ENCOUNTER — Ambulatory Visit (HOSPITAL_BASED_OUTPATIENT_CLINIC_OR_DEPARTMENT_OTHER)
Admission: RE | Admit: 2011-05-18 | Discharge: 2011-05-18 | Disposition: A | Discharge status: Home / Self Care | Payer: No Typology Code available for payment source | Source: Ambulatory Visit | Attending: Hematology & Oncology | Admitting: Hematology & Oncology

## 2011-05-18 DIAGNOSIS — C3490 Malignant neoplasm of unspecified part of unspecified bronchus or lung: Secondary | ICD-10-CM | POA: Insufficient documentation

## 2011-05-18 DIAGNOSIS — R928 Other abnormal and inconclusive findings on diagnostic imaging of breast: Secondary | ICD-10-CM | POA: Insufficient documentation

## 2011-05-23 ENCOUNTER — Ambulatory Visit (INDEPENDENT_AMBULATORY_CARE_PROVIDER_SITE_OTHER): Payer: No Typology Code available for payment source | Admitting: Internal Medicine

## 2011-05-23 ENCOUNTER — Ambulatory Visit
Admission: RE | Admit: 2011-05-23 | Discharge: 2011-05-23 | Disposition: A | Discharge status: Home / Self Care | Payer: No Typology Code available for payment source | Source: Ambulatory Visit | Attending: Internal Medicine | Admitting: Internal Medicine

## 2011-05-23 ENCOUNTER — Encounter (INDEPENDENT_AMBULATORY_CARE_PROVIDER_SITE_OTHER): Payer: Self-pay | Admitting: Internal Medicine

## 2011-05-23 DIAGNOSIS — R059 Cough, unspecified: Secondary | ICD-10-CM

## 2011-05-23 DIAGNOSIS — J45909 Unspecified asthma, uncomplicated: Secondary | ICD-10-CM

## 2011-05-23 MED ORDER — ALBUTEROL SULFATE HFA 90 MCG/ACTUATION AEROSOL INHALER
1.0000 | INHALATION_SPRAY | Freq: Four times a day (QID) | RESPIRATORY_TRACT | Status: DC | PRN
Start: 2011-05-23 — End: 2013-03-08

## 2011-05-23 MED ORDER — MONTELUKAST 10 MG TABLET
10.0000 mg | ORAL_TABLET | Freq: Every evening | ORAL | Status: DC
Start: 2011-05-23 — End: 2011-06-21

## 2011-05-23 NOTE — Progress Notes (Signed)
 Subjective:     Patient ID:  [name] is an 60 y.o. female     Chief Complaint:    Chief Complaint   Patient presents with   . Follow-up After Testing     PFT/BRONCH CHALLENGE       HPI Comments: She is dealing with an abnormal mammogram.  She had a MCT positive at stage I.  She is reluctant to use Advair.        Review of Systems   Constitutional: Negative for fever and chills.   HENT: Negative for congestion.    Respiratory: Positive for cough and shortness of breath.    Cardiovascular: Negative.    Gastrointestinal: Negative for heartburn.   Endo/Heme/Allergies: Negative for environmental allergies.       Objective:     Physical Exam   Constitutional: She is oriented to person, place, and time and well-developed, well-nourished, and in no distress.   HENT:   Head: Normocephalic and atraumatic.   Cardiovascular: Normal rate and regular rhythm.    Pulmonary/Chest: Effort normal and breath sounds normal.   Musculoskeletal: Normal range of motion.   Neurological: She is alert and oriented to person, place, and time.   Skin: Skin is warm and dry.   Psychiatric: Memory and affect normal.         Ortho/Musculoskeletal:   Normal range of motion.       Assessment & Plan:     1. Asthma (493.90)  RAST PANEL,[facility] - [facility] ONLY, TOTAL IMMUNOGLOBULIN IGE, albuterol  sulfate (PROVENTIL  OR VENTOLIN ) 90 mcg/actuation Inhalation HFA Aerosol Inhaler, montelukast  (SINGULAIR ) 10 mg Oral Tablet   2. Cough (786.2)  RAST PANEL,[facility] - [facility] ONLY, TOTAL IMMUNOGLOBULIN IGE, albuterol  sulfate (PROVENTIL  OR VENTOLIN ) 90 mcg/actuation Inhalation HFA Aerosol Inhaler, montelukast  (SINGULAIR ) 10 mg Oral Tablet       She wants to stay away from the steroid/LABA combo at this time until she talks to her family.  She prefers a trial of Singulair .    Plan:    Singulair   Ventolin  PRN  RAST/total IgE

## 2011-05-24 ENCOUNTER — Other Ambulatory Visit (INDEPENDENT_AMBULATORY_CARE_PROVIDER_SITE_OTHER): Payer: Self-pay | Admitting: Hematology & Oncology

## 2011-05-25 LAB — RAST PANEL,EASTERN AG REGION - BMC/JMC ONLY

## 2011-05-25 LAB — IGE, TOTAL IMMUNOGLOBULIN E: IMMUNOGLOBULIN E (IGE): 6 kU/L (ref <=114)

## 2011-06-02 ENCOUNTER — Other Ambulatory Visit (INDEPENDENT_AMBULATORY_CARE_PROVIDER_SITE_OTHER): Payer: Self-pay | Admitting: GENERAL SURGERY

## 2011-06-02 ENCOUNTER — Ambulatory Visit (INDEPENDENT_AMBULATORY_CARE_PROVIDER_SITE_OTHER): Payer: Self-pay

## 2011-06-02 NOTE — Telephone Encounter (Signed)
Pt called and informed me she canceled her stereotactic bx  She doesn't like the fact that two techs do the bx and not a dr at [facility]. Pt states she is going to [place] to have bx done. She wanted me to thank dr Lorrene Reid for all that he has done. He has been wonderful, but she does not want a tech to do bx. Dr Lorrene Reid has been informed

## 2011-06-07 ENCOUNTER — Ambulatory Visit (HOSPITAL_BASED_OUTPATIENT_CLINIC_OR_DEPARTMENT_OTHER): Payer: No Typology Code available for payment source

## 2011-06-09 ENCOUNTER — Ambulatory Visit (HOSPITAL_BASED_OUTPATIENT_CLINIC_OR_DEPARTMENT_OTHER)
Admission: RE | Admit: 2011-06-09 | Discharge: 2011-06-09 | Disposition: A | Discharge status: Home / Self Care | Payer: No Typology Code available for payment source | Source: Ambulatory Visit | Attending: GENERAL SURGERY | Admitting: GENERAL SURGERY

## 2011-06-09 ENCOUNTER — Other Ambulatory Visit (HOSPITAL_COMMUNITY): Payer: Self-pay | Admitting: GENERAL SURGERY

## 2011-06-09 ENCOUNTER — Other Ambulatory Visit (INDEPENDENT_AMBULATORY_CARE_PROVIDER_SITE_OTHER): Payer: Self-pay | Admitting: GENERAL SURGERY

## 2011-06-09 ENCOUNTER — Ambulatory Visit
Admission: RE | Admit: 2011-06-09 | Discharge: 2011-06-09 | Disposition: A | Discharge status: Home / Self Care | Payer: No Typology Code available for payment source | Source: Ambulatory Visit | Attending: GENERAL SURGERY | Admitting: GENERAL SURGERY

## 2011-06-09 DIAGNOSIS — N61 Mastitis without abscess: Secondary | ICD-10-CM | POA: Insufficient documentation

## 2011-06-09 DIAGNOSIS — N6039 Fibrosclerosis of unspecified breast: Secondary | ICD-10-CM | POA: Insufficient documentation

## 2011-06-09 DIAGNOSIS — N63 Unspecified lump in unspecified breast: Secondary | ICD-10-CM

## 2011-06-09 DIAGNOSIS — D249 Benign neoplasm of unspecified breast: Secondary | ICD-10-CM | POA: Insufficient documentation

## 2011-06-09 MED ORDER — LIDOCAINE HCL 10 MG/ML (1 %) INJECTION SOLUTION
2.0000 mL | Freq: Once | INTRAMUSCULAR | Status: AC
Start: 2011-06-09 — End: 2011-06-09
  Administered 2011-06-09: 20 mg via INTRADERMAL
  Filled 2011-06-09: qty 2

## 2011-06-09 MED ORDER — LIDOCAINE 1 %-EPINEPHRINE 1:100,000 INJECTION SOLUTION
5.0000 mL | Freq: Once | INTRAMUSCULAR | Status: AC
Start: 2011-06-09 — End: 2011-06-09
  Administered 2011-06-09: 50 mg via INTRAMUSCULAR
  Filled 2011-06-09: qty 5

## 2011-06-09 NOTE — Discharge Instructions (Signed)
Minimally Invasive Breast Biopsy Care Instructions     You may take a shower today.Let the water run over the incision. Pat it dry. No bathing or soaking your breast in water until the glue flakes off.   If the incision comes open, place a dry band-aid on it. DO NOT apply ointments to the incision.   Check your incision frequently (every hour for the next four hours). If there would be bleeding present, apply firm pressure against the incision. If after 10 minutes of pressure, the area does not stop bleeding, go to the nearest E.R or Urgent Care Center.   Wear your bra continuously for the next 48 hours.   Keep ice on the incision until bedtime tonight. It is important that the ice pack does not touch your bare skin as it can cause frostbite.   You may have mild to moderate discomfort and a small to moderate amount of bruising. This is normal.   If you need medication for discomfort, take non-aspirin products (such as Tylenol, Motrin, Advil, or Ibuprofen), as directed by the manufacture for pain. DO NOT USE ASPIRIN for 48 hours.   Please avoid strenuous activities, such as, heavy cleaning, sweeping, vacuuming, yard work, tennis, golf, aerobics, skiing, weight lifting for 72 hours. You cannot lift anything with the affected side greater than 5 pounds for the next 72 hours.   Watch for signs of infection. Excessive bleeding, drainage, swelling, redness, heat, pain, or fever. If that should happen, please call [phone], or your physician. If it would be on a weekend please go to an Urgent Care Center or E.R.   For several days or even a couple weeks, you may have tenderness, "twinges", and a bump. This can be bothersome, but is not abnormal. Hot, moist packs using a wet washcloth heated in a microwave for 10 seconds and placed in a plastic bag may make this feel better. Do not use a heating pad. Usually this will disappear completely with a little time.   We will call you once we receive your pathology  report.  Approximate time is seven to fourteen days.   If you should have any questions or complications, please call Jahnessa Vanduyn, [facility] Nurse at [phone]

## 2011-06-09 NOTE — Procedures (Signed)
[facility]    [facility]  Breast Biopsy Pre/Post Assessment       Nicole, Montes  Date of Service: 06/09/2011    Family Present in Biopsy Room:no  Emotional Status:Anxious       TIME OUT DOCUMENTATION:  Correct patient: Yes  Correct patient position: Yes  Correct procedure: Yes  Correct side and site are marked: Yes  Correct equipment: Yes  Name of technologist present for time out: Tammy Harbert  Name of physician present for time out: Burnard Hawthorne, MD  Name of resident present for time out: Resident not present  Name of nurse present for time out: Janetta Hora  Time out - Site1: 1530Right    Left 1538        Allergies   Allergen Reactions   . Percocet (Oxycodone-Acetaminophen) Nausea/ Vomiting     Patient Medications :Patient is not currently taking Aspirin, Plavix or Coumadin  Consent signed: Burnard Hawthorne, MD                          Site Marked: Burnard Hawthorne, MD   Patient Postioning Time: 1530Right       Left 1538  PROCEDURE:  Procedure:RUBX & LUBX  Procedure Start: 1530Right   Left 1538  Dressing Applied: Dermabond  Complications: no  Procedure Completed Time: 1600  Patient Tolerated Procedure:Good  Clip Manufacture: [employer]  11G                         Lot #: [id number] Right Breast & Left breast    POST PROCEDURE CARE:  Ace Wrap Applied:no  Ice Applied to Site:yes  Evidence of Bleeding:no  Condition at Discharge: Good  Written Discharge Instructions:yes  Follow Up With Pathology Report [facility] Care [facility]:yes    Time of Discharge:1610    Present During Procedure:  Physician:Ginger Layne, MD  Technologist:Tammy Harbert  Nurse:Daiwik Buffalo  Resident: Resident not present    Comments/notes:     Janetta Hora, RN, 06/09/2011 3:01 PM                                        Janetta Hora, RN 06/09/2011, 3:01 PM

## 2011-06-12 LAB — HISTORICAL SURGICAL PATHOLOGY SPECIMEN

## 2011-06-13 ENCOUNTER — Institutional Professional Consult (permissible substitution) (HOSPITAL_COMMUNITY): Payer: Self-pay

## 2011-06-13 ENCOUNTER — Other Ambulatory Visit (INDEPENDENT_AMBULATORY_CARE_PROVIDER_SITE_OTHER): Payer: Self-pay | Admitting: GENERAL SURGERY

## 2011-06-21 ENCOUNTER — Ambulatory Visit (INDEPENDENT_AMBULATORY_CARE_PROVIDER_SITE_OTHER): Payer: No Typology Code available for payment source | Admitting: Internal Medicine

## 2011-06-21 ENCOUNTER — Encounter (INDEPENDENT_AMBULATORY_CARE_PROVIDER_SITE_OTHER): Payer: Self-pay | Admitting: Internal Medicine

## 2011-06-21 DIAGNOSIS — Z8669 Personal history of other diseases of the nervous system and sense organs: Secondary | ICD-10-CM

## 2011-06-21 DIAGNOSIS — J45909 Unspecified asthma, uncomplicated: Secondary | ICD-10-CM

## 2011-06-21 NOTE — Progress Notes (Signed)
Subjective:     Patient ID:  [name] is an 61 y.o. female     Chief Complaint:    Chief Complaint   Patient presents with   . Follow Up For Previous Visit     1 month f/u       HPI Comments: She states that her cough and shortness have improved, more so since she dropped her Topamax.  She had a normal RAST test.  She states that her migraines are an ongoing issue, for the past 32 years, and occur almost daily.  She is hoping to see Dr. Claudette Laws at [facility] ASAP.  She has no Albuterol use and stopped the Singulair.      Review of Systems   Constitutional: Negative for fever and chills.   HENT: Negative for congestion and sore throat.    Cardiovascular: Negative.    Gastrointestinal: Negative for heartburn.   Neurological: Positive for headaches.   Endo/Heme/Allergies: Negative for environmental allergies.       Objective:     Physical Exam   Constitutional: She is oriented to person, place, and time and well-developed, well-nourished, and in no distress.   HENT:   Head: Normocephalic and atraumatic.   Cardiovascular: Normal rate and regular rhythm.    Pulmonary/Chest: Effort normal and breath sounds normal.   Musculoskeletal: Normal range of motion.   Neurological: She is alert and oriented to person, place, and time.   Skin: Skin is warm and dry.   Psychiatric: Memory and affect normal.         Ortho/Musculoskeletal:   Normal range of motion.       Assessment & Plan:     1. Asthma (493.90)    2. History of migraine headaches (V12.49)        Plan:    I have asked her to see Dr. Claudette Laws for her Migraines  Ok to stop Singulair  Albuterol PRN

## 2011-07-05 ENCOUNTER — Encounter (HOSPITAL_BASED_OUTPATIENT_CLINIC_OR_DEPARTMENT_OTHER): Payer: Self-pay

## 2011-07-05 ENCOUNTER — Encounter (INDEPENDENT_AMBULATORY_CARE_PROVIDER_SITE_OTHER): Payer: Self-pay

## 2011-07-05 ENCOUNTER — Ambulatory Visit (INDEPENDENT_AMBULATORY_CARE_PROVIDER_SITE_OTHER): Payer: No Typology Code available for payment source

## 2011-07-05 VITALS — BP 124/81 | HR 66 | Resp 17 | Wt 198.0 lb

## 2011-07-05 MED ORDER — NAPROXEN 500 MG TABLET
500.00 mg | ORAL_TABLET | Freq: Two times a day (BID) | ORAL | Status: DC
Start: 2011-07-05 — End: 2013-03-08

## 2011-07-05 MED ORDER — SUMATRIPTAN 100 MG TABLET
100.0000 mg | ORAL_TABLET | Freq: Once | ORAL | Status: DC | PRN
Start: 2011-07-05 — End: 2011-10-03

## 2011-07-05 MED ORDER — TOPIRAMATE 100 MG TABLET
ORAL_TABLET | ORAL | Status: DC
Start: 2011-07-05 — End: 2013-03-08

## 2011-07-05 NOTE — Progress Notes (Signed)
CC:  Headache    HPI: 61 year old Female with past medical history of Migraine Headaches coming to our Neurology Clinic complaining og headaches. Symptoms started when she was 61 years old, and headache is located on the left frontotemporal area, it is throbbing in character, 7/10 in intensity, with photophobia, phonophobia, osmophobia, nausea and vomiting, numbness in her fingertips and toes. Headache worsens with movement and improves with medication and rest.  Headaches last hours and occur with a frequency of 2-3 a week. She was tried with beta blockers in the past which did not work. Topamax was the medication that worked the best, but when increased to 200 mg po qday brought her some SOB, according to her. She also takes Imitrex for acute attacks, but is concern for a possible rebound headache since she is taking this medication 2-3 times a week. Her sister and daughter suffer from Migraine Headaches.     PMHx: Migraine Headaches     FH: sister and daughter suffer from Migraine Headaches.       MEDICATION: please, read medication's list    SOCIAL: Pt does not smoke , she drinks alcohol occassionally,  and she does not use illicit drugs. Pt does not work (she is a retired) and lives with her husband.     Review of Systems:  Denies any history of  diarrhea, constipation, shortness of breath, chest pain, palpitations, fevers, unusual weight loss/gain.        PHYSICAL EXAMINATION:     Pt alert, oriented in person, time and place, fluent and normal speech (no aphasia). Good mood. Normal cranial nerve examination. Pupils 3mm, reactive to light (to 2mm, photomotor and consensual reflexes preserved), normal visual fields bilaterally (visual confrontation test), normal EOM, nystagmus. Fundoscopy: no papilledema BL. Normal facial sensation bilaterally (light touch, in V1, V2, V3 distribution), symmetric face, uvula midline, palate elevates equally and fully. Tongue midline with no atrophy and fasciculations.    Upper Extremities: motor strength 5/5 in shoulder, arm, forearm and hand flexors and extensors bilaterally. Reflexes 2+ bilaterally (biceps, brachioradialis, triceps). Normal tone BL. Normal sensory exam.   Lower Extremities: motor strength 5/5 in hips, legs and foot flexors and extensors bilaterally. Patellar and Achilles reflexes 2+bilaterally. Normal sensory exam bilaterally (light touch). Normal vibration sensation bilaterally . No clonus Observed bilaterally. Babinski no present bilaterally. Normal vibration bilaterally.   Negative Romberg sign. Normal gait.     ASSESSMENT AND PLAN:  61 year old Female with past medical history of Migraine Headaches coming to our Neurology Clinic complaining og headaches. Symptoms started when she was 61 years old, and headache is located on the left frontotemporal area, it is throbbing in character, 7/10 in intensity, with photophobia, phonophobia, osmophobia, nausea and vomiting, numbness in her fingertips and toes. Her neurological examination is non focal. She has several episodes of migraine headaches a week, so she will need a preventive mediation as well as a medication for ccute attacks. She wants to titrate her Topamax since this medication worked in the past. She is currently taking 100 mg po qday and had some side effects on 200mg . I will increase it to 150 mg.    Impression: Migraine HA, post-traumatic HA, unknown eye condition    PLAN:  1) Preventive medication: Topamax 100mg  pp qam and 50mg  po qpm. If this does not work I may start her on Verapamil SR 120 mg po qday.  2) Acute Attacks: Imitrex 100 mg. Take one tablet as soon as the headache starts.  Take this medication more than twice a week. You can take Naproxen 500 mg if you may have a third episode of headaches in a week  3) Avoid triggering factors. Continue writing HA diary  4) F/up in Neuro Clinic in 4 weeks.      This first time consult took me 60 minutes. I performed a headache questionnaire, a full neurological examination, and treated her headache disorder

## 2011-07-05 NOTE — Patient Instructions (Signed)
1) Preventive medication: Topamax 100mg  pp qam and 50mg  po qpm.   2) Acute Attacks: Imitrex 100 mg. Take one tablet as soon as the headache starts. Take this medication more than twice a week. You can take Naproxen 500 mg if you may have a third episode of headaches in a week  3) Avoid triggering factors. Continue writing HA diary  4) F/up in Neuro Clinic in 4 weeks.

## 2011-08-08 ENCOUNTER — Ambulatory Visit (INDEPENDENT_AMBULATORY_CARE_PROVIDER_SITE_OTHER): Payer: No Typology Code available for payment source

## 2011-08-08 ENCOUNTER — Encounter (INDEPENDENT_AMBULATORY_CARE_PROVIDER_SITE_OTHER): Payer: Self-pay

## 2011-08-08 VITALS — BP 106/68 | HR 64 | Resp 16 | Wt 195.0 lb

## 2011-08-08 MED ORDER — VERAPAMIL ER (SR) 120 MG TABLET,EXTENDED RELEASE
120.0000 mg | ORAL_TABLET | Freq: Every day | ORAL | Status: DC
Start: 2011-08-08 — End: 2013-03-08

## 2011-08-08 NOTE — Progress Notes (Signed)
CC: Follow-for up Headache     HPI: 61 year old Female with past medical history of Migraine Headaches coming to our Neurology Clinic as a follow-up for headaches.  Headaches have improved with Topamax and has not have had any migraine headache in 2 weeks. SHe c/o chronic fatigue. She states     Symptoms History: Symptoms started when she was 61 years old, and headache is located on the left frontotemporal area, it is throbbing in character, 7/10 in intensity, with photophobia, phonophobia, osmophobia, nausea and vomiting, numbness in her fingertips and toes. Headache worsens with movement and improves with medication and rest. Headaches last hours and occur with a frequency of 2-3 a week. She was tried with beta blockers in the past which did not work. Topamax was the medication that worked the best, but when increased to 200 mg po qday brought her some SOB, according to her. She also takes Imitrex for acute attacks, but is concern for a possible rebound headache since she is taking this medication 2-3 times a week. Her sister and daughter suffer from Migraine Headaches.     PMHx: Migraine Headaches     FH: sister and daughter suffer from Migraine Headaches.     MEDICATION: please, read medication's list     SOCIAL: Pt does not smoke , she drinks alcohol occassionally, and she does not use illicit drugs. Pt does not work (she is a retired) and lives with her husband.     Review of Systems:   Denies any history of diarrhea, constipation, shortness of breath, chest pain, palpitations, fevers, unusual weight loss/gain.     PHYSICAL EXAMINATION:    Pt alert, oriented in person, time and place, fluent and normal speech (no aphasia). Good mood. Normal cranial nerve examination. Pupils 3mm, reactive to light (to 2mm, photomotor and consensual reflexes preserved), normal visual fields bilaterally (visual confrontation test), normal EOM, nystagmus. Fundoscopy: no papilledema BL. Normal facial sensation bilaterally (light touch, in V1, V2, V3 distribution), symmetric face, uvula midline, palate elevates equally and fully. Tongue midline with no atrophy and fasciculations.   Upper Extremities: motor strength 5/5 in shoulder, arm, forearm and hand flexors and extensors bilaterally. Reflexes 2+ bilaterally (biceps, brachioradialis, triceps). Normal tone BL. Normal sensory exam.   Lower Extremities: motor strength 5/5 in hips, legs and foot flexors and extensors bilaterally. Patellar and Achilles reflexes 2+bilaterally. Normal sensory exam bilaterally (light touch). Normal vibration sensation bilaterally . No clonus Observed bilaterally. Babinski no present bilaterally. Normal vibration bilaterally.   Negative Romberg sign. Normal gait.     ASSESSMENT AND PLAN: 61 year old Female with past medical history of Migraine Headaches coming to our Neurology Clinic as a follow-up for headaches.  Headaches have improved but she is c/o breathing difficulties. She also felt similar problems last time she was on 200 mg of Topamax. NO history of glaucoma or kidney stones. I will perform general lab work and refer her to a family doctor    Impression: Migraine HA, post-traumatic HA    PLAN:   1) Preventive medication: Take Verapamil SR 120 mg po qday.  Decrease Topamax 50 mg po q/3days until being completely weaned off. Advised on side effects of Verapamil and to stop it can call us if any   2) Acute Attacks: Imitrex 100 mg. Take one tablet as soon as the headache starts. Take this medication more than twice a week. You can take Naproxen 500 mg if you may have a third episode of headaches in  a week   3) Avoid triggering factors. Continue writing HA diary   4) Medicine consult for breathing difficulties and anemia. Will perform general labs  5) F/up in Neuro Clinic in 4 weeks.     This first time consult took me 30 minutes.

## 2011-08-08 NOTE — Patient Instructions (Signed)
1) Preventive medication: Take Verapamil SR 120 mg po qday.  Decrease Topamax 50 mg po q/3days until being completely weaned off. Advised on side effects of Verapamil and to stop it can call us if any  2) Acute Attacks: Imitrex 100 mg. Take one tablet as soon as the headache starts. Take this medication more than twice a week. You can take Naproxen 500 mg if you may have a third episode of headaches in a week   3) Avoid triggering factors. Continue writing HA diary   4) Medicine consult for breathing difficulties and anemia. Will perform general labs  5) F/up in Neuro Clinic in 4 weeks.

## 2011-08-11 ENCOUNTER — Ambulatory Visit (HOSPITAL_BASED_OUTPATIENT_CLINIC_OR_DEPARTMENT_OTHER)
Admission: RE | Admit: 2011-08-11 | Discharge: 2011-08-11 | Disposition: A | Discharge status: Home / Self Care | Payer: No Typology Code available for payment source | Source: Ambulatory Visit

## 2011-08-11 DIAGNOSIS — R0609 Other forms of dyspnea: Secondary | ICD-10-CM | POA: Insufficient documentation

## 2011-08-11 LAB — COMPREHENSIVE METABOLIC PROFILE - BMC/JMC ONLY
ALBUMIN: 4 g/dL (ref 3.2–5.0)
ALKALINE PHOSPHATASE: 53 IU/L (ref 35–120)
ALT (SGPT): 18 IU/L (ref 0–55)
AST (SGOT): 23 IU/L (ref 0–45)
BILIRUBIN, TOTAL: 0.7 mg/dL (ref 0.0–1.3)
CALCIUM: 9.1 mg/dL (ref 8.5–10.5)
CARBON DIOXIDE: 26 mmol/L (ref 22–32)
CHLORIDE: 109 mmol/L (ref 101–111)
CREATININE: 0.61 mg/dL (ref 0.53–1.00)
ESTIMATED GLOMERULAR FILTRATION RATE: >60 mL/min (ref >60)
GLUCOSE: 89 mg/dL (ref 70–110)
POTASSIUM: 4.3 mmol/L (ref 3.5–5.0)
SODIUM: 139 mmol/L (ref 136–145)
TOTAL PROTEIN: 6.5 g/dL (ref 6.0–8.0)

## 2011-08-11 LAB — CBC W/ AUTOMATED DIFF - BMC ONLY
BASOPHIL #: 0.02 10*3/uL (ref 0.00–0.10)
BASOPHILS %: 0.4 % (ref 0.0–1.4)
EOSINOPHIL #: 0.21 K/uL (ref 0.00–0.50)
EOSINOPHIL %: 4 % (ref 0.0–5.2)
HCT: 38 % (ref 36.0–45.0)
HGB: 12.8 g/dL (ref 12.0–15.5)
LYMPHOCYTE #: 1.5 K/uL (ref 0.70–3.20)
LYMPHOCYTE %: 28.4 % (ref 15.0–43.0)
MCH: 33.4 pg (ref 28.0–34.0)
MCHC: 33.8 g/dL (ref 33.0–37.0)
MONOCYTE #: 0.38 K/uL (ref 0.20–0.90)
MONOCYTE %: 7.2 % (ref 4.8–12.0)
MPV: 8.6 fL (ref 7.0–9.4)
PLATELET COUNT: 158 K/uL (ref 150–400)
PMN #: 3.18 K/uL (ref 1.50–6.50)
PMN %: 60 % (ref 43.0–76.0)
RDW: 11.4 % (ref 11.0–13.0)
WBC: 5.3 K/uL (ref 4.0–11.0)

## 2011-08-11 LAB — HEPATIC FUNCTION PANEL: BILIRUBIN,CONJUGATED: 0.1 mg/dL (ref 0.0–0.3)

## 2011-08-11 LAB — AMMONIA: AMMONIA: 11 umol/L (ref 0–35)

## 2011-08-14 ENCOUNTER — Encounter (INDEPENDENT_AMBULATORY_CARE_PROVIDER_SITE_OTHER): Payer: Self-pay | Admitting: Geriatric Medicine

## 2011-10-03 ENCOUNTER — Encounter (INDEPENDENT_AMBULATORY_CARE_PROVIDER_SITE_OTHER): Payer: Self-pay

## 2011-10-03 ENCOUNTER — Ambulatory Visit (INDEPENDENT_AMBULATORY_CARE_PROVIDER_SITE_OTHER): Payer: No Typology Code available for payment source

## 2011-10-03 VITALS — BP 118/77 | HR 67 | Resp 15 | Wt 200.0 lb

## 2011-10-03 MED ORDER — VENLAFAXINE 37.5 MG TABLET
37.50 mg | ORAL_TABLET | Freq: Three times a day (TID) | ORAL | Status: DC
Start: 2011-10-03 — End: 2013-03-08

## 2011-10-03 MED ORDER — SUMATRIPTAN 100 MG TABLET
100.0000 mg | ORAL_TABLET | Freq: Once | ORAL | Status: DC | PRN
Start: 2011-10-03 — End: 2013-12-15

## 2011-10-03 NOTE — Progress Notes (Signed)
CC: Follow-for up Headache + new sleep difficulty and left leg pain and weakness    HPI: 61 year old Female with past medical history of Migraine Headaches coming to our Neurology Clinic as a follow-up for headaches. Headaches have worsened since she stopped Topamax. She had 15 Migraine headaches since last appointment. She stopped Verapamil because of balance difficulties. She has migraine headaches first thing in the morning (when she wakes up). She also c/o sleeping difficulties. Her father, sister has sleep apnea, and twin brothers. She snores at night, she stops breathing for a couple of seconds, she wakes up unrefreshed, she c/o memory and concentration problems as well as fogginess.She also c/o lower back pain and leg leg weakness    Symptoms History: Symptoms started when she was 61 years old, and headache is located on the left frontotemporal area, it is throbbing in character, 7/10 in intensity, with photophobia, phonophobia, osmophobia, nausea and vomiting, numbness in her fingertips and toes. Headache worsens with movement and improves with medication and rest. Headaches last hours and occur with a frequency of 2-3 a week. She was tried with beta blockers in the past which did not work. Topamax was the medication that worked the best, but when increased to 200 mg po qday brought her some SOB, according to her. She also takes Imitrex for acute attacks, but is concern for a possible rebound headache since she is taking this medication 2-3 times a week. Her sister and daughter suffer from Migraine Headaches.     Review of Systems:   Denies any history of diarrhea, constipation, shortness of breath, chest pain, palpitations, fevers, unusual weight loss/gain.     PHYSICAL EXAMINATION:    Pt alert, oriented in person, time and place, fluent and normal speech (no aphasia). Good mood. Normal cranial nerve examination. Pupils 3mm, reactive to light (to 2mm, photomotor and consensual reflexes preserved), normal visual fields bilaterally (visual confrontation test), normal EOM, nystagmus. Normal facial sensation bilaterally (light touch, in V1, V2, V3 distribution), symmetric face, uvula midline, palate elevates equally and fully. Tongue midline with no atrophy and fasciculations. Modified Malampatti score of 3  Upper Extremities: motor strength 5/5 in shoulder, arm, forearm and hand flexors and extensors bilaterally. Reflexes 2+ bilaterally (biceps, brachioradialis, triceps). Normal tone BL. Normal sensory exam.   Lower Extremities: motor strength 5/5 in hips, legs and foot flexors and extensors bilaterally. Patellar and Achilles reflexes 2+bilaterally. Normal sensory exam bilaterally (light touch). Normal vibration sensation bilaterally . No clonus Observed bilaterally. Babinski no present bilaterally. Normal vibration bilaterally. Tenderness in bilateral lower lumbar spine area. Lumbar pain when raising her right leg  Normal gait.      ASSESSMENT AND PLAN: 61 year old Female with past medical history of Migraine Headaches coming to our Neurology Clinic as a follow-up for headaches. Headaches have worsened since she stopped Topamax. She had 15 Migraine headaches since last appointment. No triggering factors that she can tell. She stopped Verapamil because of balance difficulties. She has migraine headaches first thing in the morning (when she wakes up). She also c/o sleeping difficulties. Her father, sister has sleep apnea, and twin brothers. She snores at night, she stops breathing for a couple of seconds, she wakes up unrefreshed, she c/o memory and concentration problems as well as fogginess. She wakes up several times at night. She also c/o lower back pain and leg leg weakness. Her neurological examination revealed a Modified Malampati score of 3. Epworth Sleepiness Scale of 8, and lower back pain.  Sleep apnea may be triggering her migraine headeache and it should be diagnosed and treated. Beta blockers, Verapamil, Topamax did not work. She does not want to take Depakote.     Impression: Migraine HA + Possible Sleep apnea    PLAN:   I) Migraine headaches:  1) Preventive medication: Venlafaxine 37.5mg . Advised on side effect and to stop the medication and call 911 if any  1) Acute Attacks: continue with Imitrex 100 mg. Take one tablet as soon as the headache starts. Take this medication more than twice a week. You can take Naproxen 500 mg if you may have a third episode of headaches in a week   2) Magnesium: 400 mg po qday  3) Avoid triggering factors. Continue writing HA diary   4) Medicine consult for breathing difficulties and anemia. Will perform general labs    II) Sleep Difficulties  A) Polysomnograph test with CPAP titration  B) Sleep on your side  C) Sleep hygiene measures    III) Lower back pain, left leg weakness  A) L-Spine MRI  B) Orthopedic doctor consult for left knee study    IV)  F/up in Neuro Clinic in 4 weeks.     This first time consult took me 45 minutes. I performed an Epworth Sleepiness Scale, and a full neurological exam and treated several neurological conditions

## 2011-10-03 NOTE — Patient Instructions (Signed)
)   Preventive medication: Venlafaxine 37.5mg . Advised on side effect and to stop the medication and call 911 if any  1) Acute Attacks: continue with Imitrex 100 mg. Take one tablet as soon as the headache starts. Take this medication more than twice a week. You can take Naproxen 500 mg if you may have a third episode of headaches in a week   2) Magnesium: 400 mg po qday  3) Avoid triggering factors. Continue writing HA diary   4) Medicine consult for breathing difficulties and anemia. Will perform general labs    II) Sleep Difficulties  A) Polysomnograph test with CPAP titration  B) Sleep on your side  C) Sleep hygiene measures    III) Lower back pain, left leg weakness  A) L-Spine MRI  B) Orthopedic doctor consult for left knee study    IV)  F/up in Neuro Clinic in 4 weeks.

## 2011-10-10 ENCOUNTER — Ambulatory Visit (HOSPITAL_BASED_OUTPATIENT_CLINIC_OR_DEPARTMENT_OTHER): Payer: No Typology Code available for payment source

## 2011-11-13 ENCOUNTER — Ambulatory Visit (HOSPITAL_BASED_OUTPATIENT_CLINIC_OR_DEPARTMENT_OTHER): Payer: No Typology Code available for payment source

## 2011-11-16 ENCOUNTER — Encounter (INDEPENDENT_AMBULATORY_CARE_PROVIDER_SITE_OTHER): Payer: No Typology Code available for payment source

## 2011-11-29 ENCOUNTER — Encounter (HOSPITAL_BASED_OUTPATIENT_CLINIC_OR_DEPARTMENT_OTHER): Payer: Self-pay

## 2012-05-20 ENCOUNTER — Other Ambulatory Visit (HOSPITAL_COMMUNITY): Payer: Self-pay | Admitting: Family Medicine

## 2012-06-19 ENCOUNTER — Other Ambulatory Visit (HOSPITAL_COMMUNITY): Payer: Self-pay | Admitting: Family Medicine

## 2012-06-19 ENCOUNTER — Other Ambulatory Visit: Payer: Self-pay | Admitting: Family Medicine

## 2012-06-21 ENCOUNTER — Other Ambulatory Visit (HOSPITAL_COMMUNITY): Payer: Self-pay

## 2012-06-26 ENCOUNTER — Ambulatory Visit: Payer: No Typology Code available for payment source

## 2012-07-15 ENCOUNTER — Other Ambulatory Visit (HOSPITAL_COMMUNITY): Payer: Self-pay

## 2012-07-15 ENCOUNTER — Ambulatory Visit (HOSPITAL_COMMUNITY): Payer: Self-pay

## 2012-07-17 ENCOUNTER — Ambulatory Visit
Admission: RE | Admit: 2012-07-17 | Discharge: 2012-07-17 | Disposition: A | Discharge status: Home / Self Care | Payer: No Typology Code available for payment source | Source: Ambulatory Visit | Attending: Family Medicine | Admitting: Family Medicine

## 2012-07-17 DIAGNOSIS — Z1231 Encounter for screening mammogram for malignant neoplasm of breast: Secondary | ICD-10-CM | POA: Insufficient documentation

## 2012-07-17 DIAGNOSIS — N63 Unspecified lump in unspecified breast: Secondary | ICD-10-CM | POA: Insufficient documentation

## 2012-08-09 ENCOUNTER — Ambulatory Visit (INDEPENDENT_AMBULATORY_CARE_PROVIDER_SITE_OTHER): Payer: No Typology Code available for payment source | Admitting: VASCULAR SURGERY

## 2012-08-13 ENCOUNTER — Ambulatory Visit: Admit: 2012-08-13 | Disposition: A | Discharge status: Home / Self Care | Payer: Self-pay

## 2012-12-13 ENCOUNTER — Ambulatory Visit: Payer: No Typology Code available for payment source | Attending: VASCULAR SURGERY | Admitting: VASCULAR SURGERY

## 2012-12-13 ENCOUNTER — Encounter (INDEPENDENT_AMBULATORY_CARE_PROVIDER_SITE_OTHER): Payer: Self-pay | Admitting: VASCULAR SURGERY

## 2012-12-13 VITALS — BP 120/80 | Temp 97.0°F | Ht 67.0 in | Wt 193.0 lb

## 2012-12-13 DIAGNOSIS — I83893 Varicose veins of bilateral lower extremities with other complications: Secondary | ICD-10-CM | POA: Insufficient documentation

## 2012-12-16 NOTE — H&P (Addendum)
 Chief Complaint   Patient presents with   . Varicose Veins     HPI: [name] is a 62 y.o. White female who presents as a new patient to clinic today for evaluation of varicose veins. States she has had vein problems for years but in the last 2 years it has become worse. Also reports leg swelling. Began having tingling in feet and burning about 2 years ago. Had Blood sugar checked at that time. Is not diabetic as far as she knows. Sensation dull to temperature of hands and feet. States her legs feel heavy and achy when she stands for prolonged period of time. 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 Denies hx of DVT. Sister has varicose veins . Mother as well and mother had vein stripping DVT and Grandfather died of embolic event. She elevates legs at night takes OTC medications as needed. Has not worn stockings cannot find pair that fits well. States feet also feel stiff in the morning. Needs to shuffle and they loosen up. Has been feeling tired as well. On the verge of having hypothyroidism. Has to have Iron infusions for anemia.       PMH:   Past Medical History   Diagnosis Date   . Breast lump 2005     Resolved prior to biopsy   . LLQ abdominal pain    . Hx of migraines    . Iron deficiency anemia    . Pancytopenia      Patient Active Problem List    Diagnosis   . MTHFR mutation   . Dyspnea   . Fatigue   . Iron deficiency   . Left lower quadrant pain   . Pancytopenia     Family History   Problem Relation Age of Onset   . Congestive Heart Failure Mother    . Colon Cancer Mother    . Asthma Mother    . Diabetes Father    . Colon Cancer Father    . Asthma Sister    . Asthma Brother      Current Outpatient Prescriptions   Medication Sig Dispense Refill   . albuterol  sulfate (PROVENTIL  OR VENTOLIN ) 90 mcg/actuation Inhalation HFA Aerosol Inhaler take 1-2 Puffs by inhalation Every 6 hours as needed.  1 Inhaler  5   . multivitamin Oral Tablet take 1 Tab by mouth Once a day.       .  naproxen  (NAPROSYN ) 500 mg Oral Tablet Take 1 Tab (500 mg total) by mouth Twice daily with food.  60 Tab  3   . Sumatriptan  Succinate (IMITREX ) 100 mg Oral Tablet Take 1 Tab (100 mg total) by mouth Once, as needed for Migraine for 1 dose May repeat in 2 hours in needed  60 Tab  3   . topiramate  (TOPAMAX ) 100 mg Oral Tablet Take 2 tablets in the morning and 1 tablets at night  90 Tab  3   . Topiramate  (TOPAMAX ) 50 mg Oral Tablet take 50 mg by mouth Twice daily.         . venlafaxine  (EFFEXOR ) 37.5 mg Oral Tablet Take 1 Tab (37.5 mg total) by mouth Three times a day  30 Tab  30   . verapamil  120 mg Oral Tablet Sustained Release Take 1 Tab (120 mg total) by mouth Once a day  30 Tab  3     No current facility-administered medications for this visit.     Allergies   Allergen Reactions   .  Percocet (Oxycodone -Acetaminophen ) Nausea/ Vomiting     Past Surgical History   Procedure Laterality Date   . Hx cesarean section  1993   . Hx tonsillectomy  1955   . Hx colonoscopy     . Hx endoscopic sinus surgery       History     Social History   . Marital Status: Married     Spouse Name: N/A     Number of Children: N/A   . Years of Education: N/A     Occupational History   . 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 Smoking status: Never Smoker    . Smokeless tobacco: Not on file   . Alcohol Use: No   . Drug Use: No   . Sexually Active: Yes -- Female partner(s)     Other Topics Concern   . Not on file     Social History Narrative   . No narrative on file     REVIEW OF SYMPTOMS  General: Denies fever, chills, wt changes  EENT: Denies changes in vision, hearing, reports headache  Cardiac: Denies chest pain, palpitations  Respiratory: Denies SOB, cough  Gastrointestinal: Reports Heartburn,nausea,vomiting,diarrhea, constipation   Genitourinary: Reports urgency frequency related to incontinence  Movement/Neurologic: none reported  Musculoskeletal:negativeback pain, joint pain  Skin:negative  rash  Neurological:positive tingling, no dizzyness  Psychiatric/ Behavioral:negativedepression, anxiety    PHYSICAL EXAM:  Vitals: BP 120/80  Temp(Src) 36.1 C (97 F)  Ht 1.702 m (5' 7)  Wt 87.544 kg (193 lb)  BMI 30.22 kg/m2  General: AA&O X3 Well developed and well nourished in no acute distress   HENT: Head is normocephalic, atraumatic   Eyes: Conjunctiva clear., Pupils equal and round. , Sclera non-icteric. EOMI  Neck: Normal ROM, Supple, symmetrical, trachea midline, No JVD or thyromegaly   Lungs: Effort normal, clear to auscultation bilaterally.   Cardiovascular: Heart regular rate and rhythm, S1, S2 normal, no murmur, click, rub or gallop  Vascular:  no carotid bruits,   left radial artery:  2+ (normal),   right radial artery:  2+ (normal),    Extremities: extremities normal, atraumatic, no cyanosis or edema, spider/reticluar veins throughout b/l lower extremities. Sensation dull Right worse then left. Proprioception intact  Skin: Skin warm and dry  Neurologic: Grossly normal, no focal neuro deficit, normal coordination and gait  Psychiatric: normal affect, behavior, memory, thought content, judgement, and speech.    ASSESSMENT AND PLAN  Chief Complaint   Patient presents with   . Varicose Veins     [name] is a 62 y.o. female being seen at clinic today for evaluation of spider/reticluar veins. Patient has not worn compression on regular basis. Gave rx for custom compression stockings. Patient soul see neuro for neuropathy/migraines. Requests [facility] [place] to be established with. Patient should see PCP to have blood sugar rechecked. Will have patient rtc in 3 months with venous insuff study. The patient's examination and imaging were discussed today.  Patient was given the opportunity to ask questions and those questions were answered to her satisfaction.    Tawni Jungling, PA-C 12/16/2012, 2:56 PM    I saw and examined the patient.  I reviewed the PA's note.  I agree with the findings and  plan of care as documented in the note.  Any exceptions/additions are edited/noted.    I personally saw and examined the patient. See mid-level's note for additional details.  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 Minimal edema. Warm and pink feet/    Belma Safer, MD 12/16/2012  3: 18 PM

## 2013-03-08 ENCOUNTER — Encounter (INDEPENDENT_AMBULATORY_CARE_PROVIDER_SITE_OTHER): Payer: Self-pay | Admitting: Family Medicine

## 2013-03-08 ENCOUNTER — Other Ambulatory Visit
Admission: RE | Admit: 2013-03-08 | Discharge: 2013-03-08 | Disposition: A | Discharge status: Home / Self Care | Payer: No Typology Code available for payment source | Attending: Family Medicine | Admitting: Family Medicine

## 2013-03-08 ENCOUNTER — Ambulatory Visit (INDEPENDENT_AMBULATORY_CARE_PROVIDER_SITE_OTHER): Payer: No Typology Code available for payment source | Admitting: Family Medicine

## 2013-03-08 VITALS — BP 104/64 | HR 84 | Temp 97.9°F | Resp 16 | Wt 191.6 lb

## 2013-03-08 DIAGNOSIS — R300 Dysuria: Secondary | ICD-10-CM | POA: Insufficient documentation

## 2013-03-08 MED ORDER — PHENAZOPYRIDINE 100 MG TABLET
100.00 mg | ORAL_TABLET | Freq: Three times a day (TID) | ORAL | Status: AC
Start: 2013-03-08 — End: 2013-03-11

## 2013-03-08 MED ORDER — CIPROFLOXACIN 500 MG TABLET
500.00 mg | ORAL_TABLET | Freq: Two times a day (BID) | ORAL | Status: DC
Start: 2013-03-08 — End: 2013-03-11

## 2013-03-08 NOTE — Patient Instructions (Addendum)
[facility]  [address]  [place], [place] [address]  Phone: [phone]  Fax: [phone]             Open Daily 8:00am - 8:00pm, except Sundays 12pm-8pm         ~ Closed Thanksgiving and Christmas Day     Attending Caregiver: Talmage Nap, MD      Today's orders:   Orders Placed This Encounter   . URINE CULTURE - [facility] ONLY   . POCT URINE DIPSTICK   . ciprofloxacin (CIPRO) 500 mg Oral Tablet   . phenazopyridine (PYRIDIUM) 100 mg Oral Tablet        Prescription(s) E-Rx to:  [facility] - [place], [place] - [address]    ________________________________________________________________________  Short Term Disability and Family Medical Leave Act  [facility] does NOT provide assistance with any disability applications.  If you feel your medical condition requires you to be on disability, you will need to follow up with  Your primary care physician or a specialist.  We apologize for any inconvenience.    For Medication Prescribed by [facility]:  As an Urgent Care facility, our clinic does NOT offer prescription refills over the telephone.    If you need more of the medication one of our medical providers prescribed, you will  Either need to be re-evaluated by [place] or see your primary care physician.    ________________________________________________________________________      It is very important that we have a phone number that is the single best way to contact you in the event that we become aware of important clinical information or concerns after your discharge.  If the phone number you provided at registration is NOT this number you should inform staff and registration prior to leaving.       Your treatment and evaluation today was focused on identifying and treating potentially emergent conditions based on your presenting signs, symptoms, and history.  The resulting initial clinical impression and treatment plan is not intended to be definitive or a substitute for a full physical examination and evaluation by your primary care provider.  If your symptoms persist, worsen, or you develop any new or concerning symptoms, you need to be evaluated.      If you received x-rays during your visit, be aware that the final and formal interpretation of those films by a radiologist may occur after your discharge.  If there is a significant discrepancy identified after your discharge, we will contact you at the telephone number provided at registration.      If you received a pelvic exam, you may have cultures pending for sexually transmitted diseases.  Positive cultures are reported to the [facility] Department of Health as required by state law.  You should be contacted if you cultures are positive.  We will not contact you if they are negative.  You did NOT receive a PAP smear (the screening test for cervical).  This specific test for women is best performed by your gynecologist or primary care provider when indicated.      If you are over 61 year old, we cannot discuss your personal health information with a parent, spouse, family member, or anyone else without your express consent.  This does not include those who have legitimate access to your records and information to assist in your care under the provisions of HIPAA [facility] Portability and Accountability Act) law, or those to whom  you have previously given express written consent to do so, such a legal guardian or Power of [address].       You may have received medication that may cause you to feel drowsy and/or light headed for several hours.  You may even experience some amnesia of your stay.  You should avoid operating a motor vehicle or performing any activity requiring complete alertness or coordination until you feel fully awake (approximately 24-48 hours).  Avoid alcoholic beverages.  You may also have a dry mouth for several hours.  This is a normal side effect and will disappear as the effects of the medication wear off.      Instructions discussed with patient upon discharge by clinical staff with all questions answered.  Please call [facility] [phone]) if any further questions.  Go immediately to the emergency department if any concern or worsening symptoms.      Talmage Nap, MD 03/08/2013, 1:17 PM      Urinary Tract Infection  Urinary tract infections (UTIs) can develop anywhere along your urinary tract. Your urinary tract is your body's drainage system for removing wastes and extra water. Your urinary tract includes two kidneys, two ureters, a bladder, and a urethra. Your kidneys are a pair of bean-shaped organs. Each kidney is about the size of your fist. They are located below your ribs, one on each side of your spine.  CAUSES  Infections are caused by microbes, which are microscopic organisms, including fungi, viruses, and bacteria. These organisms are so small that they can only be seen through a microscope. Bacteria are the microbes that most commonly cause UTIs.  SYMPTOMS    Symptoms of UTIs may vary by age and gender of the patient and by the location of the infection. Symptoms in young women typically include a frequent and intense urge to urinate and a painful, burning feeling in the bladder or urethra during urination. Older women and men are more likely to be tired, shaky, and weak and have muscle aches and abdominal pain. A fever may mean the infection is in your kidneys. Other symptoms of a kidney infection include pain in your back or sides below the ribs, nausea, and vomiting.  DIAGNOSIS  To diagnose a UTI, your caregiver will ask you about your symptoms. Your caregiver also will ask to provide a urine sample. The urine sample will be tested for bacteria and white blood cells. White blood cells are made by your body to help fight infection.  TREATMENT   Typically, UTIs can be treated with medication. Because most UTIs are caused by a bacterial infection, they usually can be treated with the use of antibiotics. The choice of antibiotic and length of treatment depend on your symptoms and the type of bacteria causing your infection.  HOME CARE INSTRUCTIONS   If you were prescribed antibiotics, take them exactly as your caregiver instructs you. Finish the medication even if you feel better after you have only taken some of the medication.   Drink enough water and fluids to keep your urine clear or pale yellow.   Avoid caffeine, tea, and carbonated beverages. They tend to irritate your bladder.   Empty your bladder often. Avoid holding urine for long periods of time.   Empty your bladder before and after sexual intercourse.   After a bowel movement, women should cleanse from front to back. Use each tissue only once.  SEEK MEDICAL CARE IF:    You have back pain.   You develop  a fever.   Your symptoms do not begin to resolve within 3 days.  SEEK IMMEDIATE MEDICAL CARE IF:    You have severe back pain or lower abdominal pain.   You develop chills.    You have nausea or vomiting.   You have continued burning or discomfort with urination.  MAKE SURE YOU:    Understand these instructions.   Will watch your condition.   Will get help right away if you are not doing well or get worse.  Document Released: 03/01/2005 Document Revised: 11/21/2011 Document Reviewed: 06/30/2011  [facility] Patient Information 2014 [place], [place].

## 2013-03-08 NOTE — Progress Notes (Signed)
BP 104/64   Pulse 84   Temp(Src) 36.6 C (97.9 F) (Oral)   Resp 16   Wt 86.909 kg (191 lb 9.6 oz)   BMI 30 kg/m2  Shakeeta Godette N Long, RN 03/08/2013, 12:15 PM

## 2013-03-08 NOTE — Progress Notes (Signed)
 03/08/13 1200   Urine   Time collected 1200   Glucose Negative   Bilirubin Negative   Ketones Negative   Specific Gravity 1.030   Blood (urine) Large (3+)   pH 6.0   Protein ! 3+ (300mg /dL)   Urobilinogen Normal    Nitrite Negative   Leukocytes ! 2+   Initials anl

## 2013-03-08 NOTE — Progress Notes (Signed)
 SUBJECTIVE: [name] is a 62 y.o. female who complains of urinary frequency, urgency and dysuria x 2 days, without flank pain, fever, chills, or abnormal vaginal discharge or bleeding.     ROS: denies headaches, no chest pain or shortness of breath, denies black or bloody stools but had diarrhea last week, this has now resolved, no weakness or difficulty with ambulation, denies skin rash or lesion    History     Social History   . Marital Status: Married     Spouse Name: N/A     Number of Children: N/A   . Years of Education: N/A     Occupational History   . 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 Smoking status: Never Smoker    . Smokeless tobacco: Not on file   . Alcohol Use: No   . Drug Use: No   . Sexual Activity: Yes     Partners: Male     Other Topics Concern   . Not on file     Social History Narrative   . No narrative on file     Patient Active Problem List   Diagnosis   . Pancytopenia   . Left lower quadrant pain   . Iron deficiency   . Fatigue   . Dyspnea   . MTHFR mutation     Family History   Problem Relation Age of Onset   . Congestive Heart Failure Mother    . Colon Cancer Mother    . Asthma Mother    . Diabetes Father    . Colon Cancer Father    . Asthma Sister    . Asthma Brother        Outpatient Prescriptions Prior to Visit:  Sumatriptan  Succinate (IMITREX ) 100 mg Oral Tablet Take 1 Tab (100 mg total) by mouth Once, as needed for Migraine for 1 dose May repeat in 2 hours in needed   albuterol  sulfate (PROVENTIL  OR VENTOLIN ) 90 mcg/actuation Inhalation HFA Aerosol Inhaler take 1-2 Puffs by inhalation Every 6 hours as needed.   multivitamin Oral Tablet take 1 Tab by mouth Once a day.   naproxen  (NAPROSYN ) 500 mg Oral Tablet Take 1 Tab (500 mg total) by mouth Twice daily with food.   topiramate  (TOPAMAX ) 100 mg Oral Tablet Take 2 tablets in the morning and 1 tablets at night   Topiramate  (TOPAMAX ) 50 mg Oral Tablet take 50 mg by mouth Twice daily.      venlafaxine  (EFFEXOR ) 37.5 mg Oral Tablet Take 1 Tab (37.5 mg total) by mouth Three times a day   verapamil  120 mg Oral Tablet Sustained Release Take 1 Tab (120 mg total) by mouth Once a day     No facility-administered medications prior to visit.    OBJECTIVE: BP 104/64  Pulse 84  Temp(Src) 36.6 C (97.9 F) (Oral)  Resp 16  Wt 86.909 kg (191 lb 9.6 oz)  BMI 30 kg/m2  Appears well, in no apparent distress.   General: appears in good health, appears stated age and no distress  Eyes: Conjunctiva clear., Pupils equal and round.   Lungs: clear to auscultation bilaterally.   Cardiovascular:    Heart regular rate and rhythm   Abdomen: soft without tenderness, guarding, mass, rebound or organomegaly. No CVA tenderness or inguinal adenopathy noted.     Urine dipstick shows positive for RBCs, positive for protein and positive for leukocytes.  Micro exam: not done.     ASSESSMENT:    ICD-9-CM    1. UTI (lower urinary tract infection) 599.0 ciprofloxacin  (CIPRO ) 500 mg Oral Tablet     phenazopyridine  (PYRIDIUM ) 100 mg Oral Tablet   2. Dysuria 788.1 POCT URINE DIPSTICK     URINE CULTURE - [facility] ONLY       PLAN: Antibiotic as per orders - Urine Sent for culture, also push water  intake, may use Pyridium  OTC prn. Pt verbalized understanding and agrees with the plan of care. Call or return to clinic prn if these symptoms worsen or fail to improve as anticipated.

## 2013-03-09 ENCOUNTER — Encounter

## 2013-03-09 NOTE — Progress Notes (Signed)
Patient seen at urgent care yesterday. Called to follow up and feeling better. Advised to call if further questions or concerns.

## 2013-03-10 NOTE — Progress Notes (Signed)
Quick Note:    Await final  ______

## 2013-03-11 ENCOUNTER — Other Ambulatory Visit (INDEPENDENT_AMBULATORY_CARE_PROVIDER_SITE_OTHER): Payer: Self-pay | Admitting: PHYSICIAN ASSISTANT

## 2013-03-11 LAB — URINE CULTURE
AMIKACIN: <=2 — AB
AMPICILLIN/SULBACTAM: >=32 — AB
AMPICILLIN: >=32 — AB
AZTREONAM: <=1 — AB
CEFAZOLIN: <=4 — AB
CEFEPIME: <=1 — AB
CEFTRIAXONE: <=1 — AB
CIPROFLOXACIN: >=4 — AB
ERTAPENEM: <=0.5 — AB
ESBL: NEGATIVE
GENTAMICIN: <=1 — AB
IMIPENEM: <=0.25 — AB
MEROPENEM: <=0.25 — AB
NITROFURANTOIN: <=16 — AB
TOBRAMYCIN: <=1 — AB
TRIMETHOPRIM/SULFAMETHOXAZOLE: >=320 — AB

## 2013-03-11 MED ORDER — NITROFURANTOIN MONOHYDRATE/MACROCRYSTALS 100 MG CAPSULE
100.00 mg | ORAL_CAPSULE | Freq: Two times a day (BID) | ORAL | Status: AC
Start: 2013-03-11 — End: 2013-03-16

## 2013-03-11 NOTE — Progress Notes (Signed)
Quick Note:    Your urine culture shows that the infection is resistant to the antibiotic(ciprofloxacin) you were prescribed. Please stop cipro.  Macrobid was sent to your pharmacy. Please take as prescribed.  Please follow up with your primary care physician if you are still having symptoms.  Thank You.    ______

## 2013-03-11 NOTE — Progress Notes (Signed)
Quick Note:    Current Outpatient Prescriptions:  ciprofloxacin (CIPRO) 500 mg Oral Tablet, Take 1 Tab (500 mg total) by mouth Twice daily for 5 days  phenazopyridine (PYRIDIUM) 100 mg Oral Tablet, Take 1 Tab (100 mg total) by mouth Three times a day for 3 days  Sumatriptan Succinate (IMITREX) 100 mg Oral Tablet, Take 1 Tab (100 mg total) by mouth Once, as needed for Migraine for 1 dose May repeat in 2 hours in needed      resistant to cipro.  Rx for macrobid e-scripted pharmamcy     ______

## 2013-03-12 ENCOUNTER — Other Ambulatory Visit (INDEPENDENT_AMBULATORY_CARE_PROVIDER_SITE_OTHER): Payer: Self-pay | Admitting: Family Medicine

## 2013-03-12 NOTE — Telephone Encounter (Signed)
Incorrect provider

## 2013-03-28 ENCOUNTER — Ambulatory Visit (HOSPITAL_BASED_OUTPATIENT_CLINIC_OR_DEPARTMENT_OTHER)
Admission: RE | Admit: 2013-03-28 | Discharge: 2013-03-28 | Disposition: A | Discharge status: Home / Self Care | Payer: No Typology Code available for payment source | Source: Ambulatory Visit | Attending: VASCULAR SURGERY | Admitting: VASCULAR SURGERY

## 2013-03-28 ENCOUNTER — Ambulatory Visit (HOSPITAL_BASED_OUTPATIENT_CLINIC_OR_DEPARTMENT_OTHER): Payer: No Typology Code available for payment source | Admitting: VASCULAR SURGERY

## 2013-03-28 ENCOUNTER — Ambulatory Visit
Admission: RE | Admit: 2013-03-28 | Discharge: 2013-03-28 | Disposition: A | Discharge status: Home / Self Care | Payer: No Typology Code available for payment source | Source: Ambulatory Visit | Attending: VASCULAR SURGERY | Admitting: VASCULAR SURGERY

## 2013-03-28 VITALS — BP 130/80 | HR 56 | Temp 97.7°F | Ht 67.5 in | Wt 190.0 lb

## 2013-03-28 DIAGNOSIS — M79609 Pain in unspecified limb: Secondary | ICD-10-CM | POA: Insufficient documentation

## 2013-03-28 NOTE — Procedures (Addendum)
 [place]  [facility]  Cardiovascular and Interventional Services  Bilateral Lower Extremity Pulse Volume Recordings at Rest    Name: [name], 62 y.o., female    MRN: [id number]   DOB: 1951/02/03    Indication: Leg Pain  Referring Physician: Onita Maus PA-C  Performing Tech:  Rosaline Pott, RDMS    Date of Service: 03/28/2013           Right Segmental BP:  Right Brachial Pressure mmHg: 116    Right High Thigh Pressure mmHg: CNO    Right Low Thigh Pressure mmHg: 179  Right Low Thigh Index: 1.43    Right Calf Pressure mmHg: 140  Right Calf Index: 1.12    Right Ankle (PT) Pressure mmHg: 142  Right Ankle (PT) Index: 1.14    Right Ankle (DP) Pressure mmHg: 133  Right Ankle (DP) Index: 1.06    Right Toe Pressure mmHg: 103 mmHg  Right Toe Index: 0.82  Right Great Toe Amputation: No      Left Segmental BP:  Left Brachial Pressure mmHg: 125    Left High Thigh Pressure mmHg: CNO     Left Low Thigh Pressure mmHg: CNO     Left Calf Pressure mmHg: 132  Left Calf Index: 1.06    Left Ankle (PT) Pressure mmHg: 136  Left Ankle (PT) Index: 1.09    Left Ankle (DP) Pressure mmHg: 132  Left Ankle (DP) Index: 1.06    Left Toe Pressure mmHg: 106 mmHg  Left Toe Index: 0.85  Left Great Toe Amputation: No    Waveforms available in HPF.    Performing Tech:  Rosaline Hendricks Pott, RDMS 03/28/2013, 10:00 AM      Reviewing Sonographer Impression:     Non-compressible vessels are noted at the high thigh and low thigh in both lower extremities. The presence of non-compressible and/or rigid vessels are indicative of significant arterial wall calcification. Waveforms appear normal in both lower extremities. The ankle - brachial indices (ABI's) and toe - brachial indices are normal in both lower extremities.    [name], RVS 03/28/2013, 10:34 AM    This is a preliminary report until verified by a physician.   I have reviewed this non invasive vascular examination.    Conclusion:      Non-compressible vessels  are noted at the high thigh and low thigh in both lower extremities. The presence of non-compressible and/or rigid vessels are indicative of significant arterial wall calcification. Waveforms appear normal in both lower extremities. The ankle - brachial indices (ABI's) and toe - brachial indices are normal in both lower extremities.    Sharlet Donald, MD 03/29/2013, 10:04 AM

## 2013-03-28 NOTE — Procedures (Addendum)
[facility]  Cardiovascular and Interventional Services  Bilateral Lower Extremity Venous Insufficiency Evaluation    Name: [name], 62 y.o., female    MRN: [id number]   DOB: 1951/02/16    Indication: Leg Pain  Referring Physician: Jani Files PA-C  Performing Tech:  Melburn Popper, RDMS    Date of Service: 03/28/2013          Marland KitchenNV=Not Visualized)    Past History:  Previous Clot: no      Pulm. Emboli: no  Malignancy: no  Anticoagulant: no  Heart Disease: no  Phlebitis: no  Trauma: no  Pregnancy: no  Hormones: no  Oral Contraceptives: N/A    RIGHT COMMON FEMORAL VEIN   Compressibility-  good   Phasic-  good   Spontaneous-  good   Augmentation-  good   Filling Defect-  no   Reflux-  no     RIGHT SUPERFICIAL FEMORAL VEIN   Compressibility-  good   Phasic-  good   Spontaneous-  good   Augmentation-  good   Filling Defect-  no   Reflux-  no     RIGHT POPLITEAL   Compressibility-  good   Phasic-  good   Spontaneous-  good   Augmentation-  good   Filling Defect- no   Reflux-  no     RIGHT POSTERIOR TIBIAL VEIN-  Normal   RIGHT PERONEAL-  Normal     LEFT COMMON FEMORAL VEIN   Compressibility-  good   Phasic-  good   Spontaneous-  good   Augmentation-  good   Filling Defect-  no   Reflux-  no     LEFT SUPERFICIAL FEMORAL VEIN   Compressibility-  good   Phasic-  good   Spontaneous-  good   Augmentation-  good   Filling Defect-  no   Reflux-  no     LEFT POPLITEAL   Compressibility-  good   Phasic-  good   Spontaneous-  good   Augmentation-  good   Filling Defect-  no   Reflux-  no     Left Posterior Tibial Vein-  Normal   Left Peroneal-  Normal       RIGHT LEG SUPINE   GREATER SAPHENOUS VEIN (mm)   Saphenous Femoral Junction:   5.0   Right Supine GSV Femoral Junction Reflux: None   Proximal Thigh: 1.5  Right Supine GSV Proximal Thigh Reflux: None   Mid Thigh:  1.7  Right Supine GSV Mid Thigh Reflux: None    Above Knee:  1.8  Right Supine GSV Above Knee Reflux: None    Below Knee:  1.4 Right Supine GSV Below Knee Reflux: None   Mid Calf: 0.4  Right Supine GSV Mid Calf Reflux: None   Distal Calf:  1.0 Right Supine GSV Distal Calf Reflux: None   Medial Malleolous:           LESSER  SAPHENOUS VEIN (mm)   Origin:  1.5  Right LSV Origin Reflux: None   Proximal:  1.5  None   Mid:  0.8  None   Distal:  NV           LEFT LEG SUPINE   GREATER SAPHENOUS VEIN (mm)   Saphenous Femoral Junction: 2.8   Left Supine GSV Femoral Junction Reflux: None   Proximal Thigh:  2.4   Left Supine GSV Proximal Thigh Reflux: None      Mid Thigh:  1.5  Left Supine GSV  Mid Thigh Reflux: None   Above Knee:  1.8  Left Supine GSV Above Knee Reflux: None   Below Knee:  1.3  Left Supine GSV Below Knee Reflux: None   Mid Calf: <2      Distal Calf:  <2      Medial Malleolous:           LESSER SAPHENOUS VEIN (mm)   Origin:  3.3  Left LSV Origin Reflux: None   Proximal:  1.7  Left LSV Proximal  Reflux: None   Mid:  1.4  Left LSV Mid Reflux: None   Distal:  1.6  Left LSV Distal Reflux: None     Performing Tech:  Tora Kindred, RDMS 03/28/2013, 10:05 AM      Reviewing Sonographer Impression:     Right Leg:   Deep Vein Thrombosis:Negative  Deep Reflux:Negative  The greater saphenous vein measured 5.0 mm at the saphenofemoral junction with no reflux.   The lesser saphenous vein measured 1.5 mm with no reflux.     Left leg:   Deep Vein Thrombosis:Negative  Deep Reflux:Negative  The greater saphenous vein measured 2.8 mm at the saphenofemoral junction with no reflux.   The lesser saphenous vein measured 3.3 mm with no reflux.     Ernestine Mcmurray, RVS 03/28/2013, 10:18 AM    This is a preliminary report until verified by a physician.     I have reviewed this non invasive vascular examination.    Conclusion:  No evidence of  bilateral lower extremity DVT or venous insufficiency within the limits of the examination.        Terie Purser, MD 03/29/2013, 10:04 AM

## 2013-04-01 NOTE — Progress Notes (Addendum)
Chief Complaint   Patient presents with   . Peripheral Vascular Disease     3 month follow up     History:  This a a 62 yo female who returns to clinic today for f/u of leg pain. Patient did not get her compression stockings since last visit. Still complains of tingling and feet feeling stiff when she gets up in the morning. Had testing earlier today. No new complaints.     Denies any new changes in PMHx, PSxH, Social Hx, FHx or medications.     ROS:  General: Denies fever, chills  Cardiac: Denies chest pain  Respiratory: Denies SOB  Gastrointestinal: Denies nausea, vomiting, diarrhea or problems moving bowels  Genitourinary: Denies dysuria, frequency or trouble urinating   All other systems were negative     PE:   Vitals: BP 130/80   Pulse 56   Temp(Src) 36.5 C (97.7 F) (Thermal Scan)   Ht 1.715 m (5' 7.5")   Wt 86.2 kg (190 lb 0.6 oz)   BMI 29.31 kg/m2  General: AA&O X3 Well developed and well nourished in no acute distress   HENT: Head is normocephalic, atraumatic   Eyes: Conjunctiva clear, Sclera non-icteric. EOMI  Neck: Normal ROM, Supple, symmetrical  Lungs: Effort normal  Vascular:  Palpable pedal pulses   Extremities: extremities normal, atraumatic, no cyanosis or edema, spider/reticluar veins throughout b/l lower extremities.   Skin: Skin warm and dry  Neurologic: Grossly normal, no focal neuro deficit, normal coordination and gait  Psychiatric: normal affect, behavior, memory, thought content, judgement, and speech.    Imaging Results:   Non-compressible vessels are noted at the high thigh and low thigh in both lower extremities. The presence of non-compressible and/or rigid vessels are indicative of significant arterial wall calcification. Waveforms appear normal in both lower extremities. The ankle - brachial indices (ABI's) and toe - brachial indices are normal in both lower extremities.    Conclusion:    No evidence of bilateral lower extremity DVT or venous insufficiency within the limits of the examination.    Assessment/Plan:  Patient with leg pain; likely from multiple etiologies. No surgical intervention needed from vascular standpoint. Should follow up with PCP. Follow up with vasc prn. The patient's examination and imaging were discussed with them today. Patient was given the opportunity to ask questions and those questions were answered to his satisfaction.  Jani Files, PA-C 04/01/2013, 8:11 AM    Trellis Paganini, MD 04/03/2013, 4:54 PM

## 2013-04-28 ENCOUNTER — Ambulatory Visit (INDEPENDENT_AMBULATORY_CARE_PROVIDER_SITE_OTHER): Payer: No Typology Code available for payment source

## 2013-04-28 ENCOUNTER — Encounter (INDEPENDENT_AMBULATORY_CARE_PROVIDER_SITE_OTHER): Payer: Self-pay

## 2013-04-28 ENCOUNTER — Other Ambulatory Visit
Admission: RE | Admit: 2013-04-28 | Discharge: 2013-04-28 | Disposition: A | Discharge status: Home / Self Care | Payer: No Typology Code available for payment source

## 2013-04-28 VITALS — BP 135/78 | HR 82 | Temp 98.2°F | Resp 16 | Ht 66.5 in | Wt 187.0 lb

## 2013-04-28 DIAGNOSIS — R300 Dysuria: Secondary | ICD-10-CM | POA: Insufficient documentation

## 2013-04-28 DIAGNOSIS — R350 Frequency of micturition: Secondary | ICD-10-CM | POA: Insufficient documentation

## 2013-04-28 DIAGNOSIS — R3989 Other symptoms and signs involving the genitourinary system: Secondary | ICD-10-CM | POA: Insufficient documentation

## 2013-04-28 MED ORDER — NITROFURANTOIN MONOHYDRATE/MACROCRYSTALS 100 MG CAPSULE
100.00 mg | ORAL_CAPSULE | Freq: Two times a day (BID) | ORAL | Status: AC
Start: 2013-04-28 — End: 2013-05-03

## 2013-04-28 NOTE — Progress Notes (Signed)
 04/28/13 1600   Urine   QC Urine y   Time collected 1408   Glucose Negative   Bilirubin ! 1+   Ketones Trace (5 mg/dl)   Specific Gravity 8.969   Blood (urine) Moderate (2+)   pH 5.5   Protein ! 1+ (30mg /dL)   Urobilinogen Normal    Nitrite Negative   Leukocytes ! 1+   Initials kal

## 2013-04-28 NOTE — Progress Notes (Signed)
ID: [name]   DOB: September 30, 1950  Date of Service: 04/28/2013     Chief Complaint(s):   Chief Complaint   Patient presents with    Frequent Urination    Urinary Pain    Blood in urine       Subjective:    1day of frequency, dysuria, hematuria.  No nvd.  No fever. No chills.  No dysuria.  Had uti about 2 months ago and treated with macrobid after culture evaluated and had resistant organism.  Some gross hematuria.  Tolerating po well.  No specific abd pain complaint.  Intolerance/allergy to percocet.  These have been the 1st 2 UTI that she has ever had.     Current Outpatient Prescriptions   Medication Sig    Magnesium Oxide 500 mg Oral Tablet Take 1,000 mg by mouth Once a day    multivitamin Oral Tablet Take 1 Tab by mouth Once a day    nitrofurantoin (MACROBID) 100 mg Oral Capsule Take 1 Cap (100 mg total) by mouth Twice daily for 5 days    Sumatriptan Succinate (IMITREX) 100 mg Oral Tablet Take 1 Tab (100 mg total) by mouth Once, as needed for Migraine for 1 dose May repeat in 2 hours in needed       Allergies   Allergen Reactions    Percocet [Oxycodone-Acetaminophen] Nausea/ Vomiting       Objective:  Vitals Signs are stable   Filed Vitals:    04/28/13 1557   BP: 135/78   Pulse: 82   Temp: 36.8 C (98.2 F)   TempSrc: Oral   Resp: 16   Height: 1.689 m (5' 6.5")   Weight: 84.823 kg (187 lb)       Gen:  no acute distress, non toxic looking, interactive  HEENT:  TMs are normal, oropharynx is without exudate or erythema, the neck is supple with no adenopathy  Heart:  Regular rate and rhythm without murmur  Lungs:  Clear to auscultation bilaterally no wheeze  Skin:  No obvious rash  Abdomen: Soft, with good bowel sounds, no CVA tenderness, no rigidity./rebound/guarding, +bs    Urinalysis shows Urine Results:  QC Urine: y  Time collected: 1408  Glucose: Negative  Bilirubin: Small  Ketones: Trace (5 mg/dl)  Specific Gravity: 1.610  Blood (urine): Moderate (2+)  pH: 5.5  Protein: 1+  (30mg /dL)  Urobilinogen: Normal   Nitrite: Negative  Leukocytes: Small  Initials: kal    Providence Crosby, MD 04/28/2013, 4:21 PM        Assessment:            ICD-9-CM    1. UTI symptoms 788.99 POCT URINE DIPSTICK     URINE CULTURE - [facility] ONLY   2. Burning with urination 788.1 POCT URINE DIPSTICK     URINE CULTURE - [facility] ONLY   3. Frequent urination 788.41 POCT URINE DIPSTICK     URINE CULTURE - [facility] ONLY       Orders Placed This Encounter    URINE CULTURE - [facility] ONLY    POCT URINE DIPSTICK    nitrofurantoin (MACROBID) 100 mg Oral Capsule         Plan:   1)    Patient advised to really push p.o. Fluids. Advised the patient that the best way to tell that they are taking enough fluids when her urine is mostly clear./white.    Urine culture sent for confirmation/sensitivity    Antibiotic as above, given with warnings including allergic  reaction/C. Difficile, both of which are completely unpredictable and can be very serious or severe.  Yogurt prophylaxis/probiotics discussed.      Follow up with [place] immediately if patient worsens or status changes significantly otherwise it should run benign course. Watch for fever or worsening abdominal/flank pain, dangers of septic stone were discussed which can be lifethreatening.    Warnings given that any medication can cause side effects such as allergic reaction.  All questions were answered and patient agrees to plan  Level III

## 2013-04-28 NOTE — Progress Notes (Signed)
 BP 135/78  Pulse 82  Temp(Src) 36.8 C (98.2 F) (Oral)  Resp 16  Ht 1.689 m (5' 6.5)  Wt 84.823 kg (187 lb)  BMI 29.73 kg/m2  Ileana Mallory, TECH 04/28/2013, 4:06 PM

## 2013-04-28 NOTE — Patient Instructions (Signed)
Urinary Tract Infection  Urinary tract infections (UTIs) can develop anywhere along your urinary tract. Your urinary tract is your body's drainage system for removing wastes and extra water. Your urinary tract includes two kidneys, two ureters, a bladder, and a urethra. Your kidneys are a pair of bean-shaped organs. Each kidney is about the size of your fist. They are located below your ribs, one on each side of your spine.  CAUSES  Infections are caused by microbes, which are microscopic organisms, including fungi, viruses, and bacteria. These organisms are so small that they can only be seen through a microscope. Bacteria are the microbes that most commonly cause UTIs.  SYMPTOMS   Symptoms of UTIs may vary by age and gender of the patient and by the location of the infection. Symptoms in young women typically include a frequent and intense urge to urinate and a painful, burning feeling in the bladder or urethra during urination. Older women and men are more likely to be tired, shaky, and weak and have muscle aches and abdominal pain. A fever may mean the infection is in your kidneys. Other symptoms of a kidney infection include pain in your back or sides below the ribs, nausea, and vomiting.  DIAGNOSIS  To diagnose a UTI, your caregiver will ask you about your symptoms. Your caregiver also will ask to provide a urine sample. The urine sample will be tested for bacteria and white blood cells. White blood cells are made by your body to help fight infection.  TREATMENT   Typically, UTIs can be treated with medication. Because most UTIs are caused by a bacterial infection, they usually can be treated with the use of antibiotics. The choice of antibiotic and length of treatment depend on your symptoms and the type of bacteria causing your infection.  HOME CARE INSTRUCTIONS   If you were prescribed antibiotics, take them exactly as your caregiver instructs you. Finish the medication even if you feel better after you  have only taken some of the medication.   Drink enough water and fluids to keep your urine clear or pale yellow.   Avoid caffeine, tea, and carbonated beverages. They tend to irritate your bladder.   Empty your bladder often. Avoid holding urine for long periods of time.   Empty your bladder before and after sexual intercourse.   After a bowel movement, women should cleanse from front to back. Use each tissue only once.  SEEK MEDICAL CARE IF:    You have back pain.   You develop a fever.   Your symptoms do not begin to resolve within 3 days.  SEEK IMMEDIATE MEDICAL CARE IF:    You have severe back pain or lower abdominal pain.   You develop chills.   You have nausea or vomiting.   You have continued burning or discomfort with urination.  MAKE SURE YOU:    Understand these instructions.   Will watch your condition.   Will get help right away if you are not doing well or get worse.  Document Released: 03/01/2005 Document Revised: 11/21/2011 Document Reviewed: 06/30/2011  ExitCare Patient Information 2014 [employer]    [facility]  [address], [place]  [place], [place] [address]  Phone: [phone]  Fax: [phone]               Open Daily 12:00pm - 8:00pm ~ Closed Thanksgiving and Christmas Day     Attending Caregiver: Roswell Miners, MD      Today's orders:  Orders Placed This Encounter    URINE CULTURE - [facility] ONLY    POCT URINE DIPSTICK    nitrofurantoin (MACROBID) 100 mg Oral Capsule        If you have been given and antibiotic, we recommend eating yogurt or taking PROBIOTIC pills to decrease the risk of antibiotic induced diarrhea and a resulting infection called C Difficile which can be dangerous/deadly.          Prescription(s) E-Rx to:  [facility] - [place], [place] - [address]    ________________________________________________________________________  Short Term Disability and Family Medical Leave Act  [facility] does NOT provide  assistance with any disability applications.  If you feel your medical condition requires you to be on disability, you will need to follow up with  Your primary care physician or a specialist.  We apologize for any inconvenience.    For Medication Prescribed by [facility]:  As an Urgent Care facility, our clinic does NOT offer prescription refills over the telephone.    If you need more of the medication one of our medical providers prescribed, you will  Either need to be re-evaluated by [place] or see your primary care physician.    ________________________________________________________________________      It is very important that we have a phone number that is the single best way to contact you in the event that we become aware of important clinical information or concerns after your discharge.  If the phone number you provided at registration is NOT this number you should inform staff and registration prior to leaving.      Your treatment and evaluation today was focused on identifying and treating potentially emergent conditions based on your presenting signs, symptoms, and history.  The resulting initial clinical impression and treatment plan is not intended to be definitive or a substitute for a full physical examination and evaluation by your primary care provider.  If your symptoms persist, worsen, or you develop any new or concerning symptoms, you need to be evaluated.      If you received x-rays during your visit, be aware that the final and formal interpretation of those films by a radiologist may occur after your discharge.  If there is a significant discrepancy identified after your discharge, we will contact you at the telephone number provided at registration.      If you received a pelvic exam, you may have cultures pending for sexually transmitted diseases.  Positive cultures are reported to the [facility] Department of Health as required by state law.  You should be contacted if you cultures are  positive.  We will not contact you if they are negative.  You did NOT receive a PAP smear (the screening test for cervical).  This specific test for women is best performed by your gynecologist or primary care provider when indicated.      If you are over 72 year old, we cannot discuss your personal health information with a parent, spouse, family member, or anyone else without your express consent.  This does not include those who have legitimate access to your records and information to assist in your care under the provisions of HIPAA [facility] Portability and Accountability Act) law, or those to whom you have previously given express written consent to do so, such a legal guardian or Power of [place].      You may have received medication that may cause you to feel drowsy and/or light headed  for several hours.  You may even experience some amnesia of your stay.  You should avoid operating a motor vehicle or performing any activity requiring complete alertness or coordination until you feel fully awake (approximately 24-48 hours).  Avoid alcoholic beverages.  You may also have a dry mouth for several hours.  This is a normal side effect and will disappear as the effects of the medication wear off.      Instructions discussed with patient upon discharge by clinical staff with all questions answered.  Please call [facility] [phone]) if any further questions.  Go immediately to the emergency department if any concern or worsening symptoms.      Providence Crosby, MD 04/28/2013, 4:15 PM

## 2013-04-29 ENCOUNTER — Encounter (INDEPENDENT_AMBULATORY_CARE_PROVIDER_SITE_OTHER): Payer: Self-pay

## 2013-04-29 NOTE — Progress Notes (Signed)
Patient was seen in Urgent Care yesterday.  Follow-up call made and patient is improving.  Encouraged patient to call with questions or concerns.  Adonis Brook, RN 04/29/2013, 5:06 PM

## 2013-04-30 LAB — URINE CULTURE: URINE CULTURE: >100000

## 2013-04-30 NOTE — Progress Notes (Signed)
Quick Note:    Multiple organisms on urine culture, this is likely form a contaminated specimen.   Please take all of the medicine as prescribed.   If you are still having discomfort please follow up with your primary care physician for further evaluation     We hope you are feeling better,   Talmage Nap, MD    ______

## 2013-05-09 ENCOUNTER — Ambulatory Visit
Admission: RE | Admit: 2013-05-09 | Discharge: 2013-05-09 | Disposition: A | Discharge status: Home / Self Care | Payer: No Typology Code available for payment source | Source: Ambulatory Visit | Attending: Family Medicine | Admitting: Family Medicine

## 2013-06-11 ENCOUNTER — Ambulatory Visit
Admission: RE | Admit: 2013-06-11 | Discharge: 2013-06-11 | Disposition: A | Discharge status: Home / Self Care | Payer: No Typology Code available for payment source | Source: Ambulatory Visit | Attending: Family Medicine | Admitting: Family Medicine

## 2013-06-11 DIAGNOSIS — R209 Unspecified disturbances of skin sensation: Secondary | ICD-10-CM | POA: Insufficient documentation

## 2013-06-11 LAB — CBC
BASOPHIL #: 0 10*3/uL (ref 0.0–0.10)
BASOPHILS %: 1 % (ref 0–2.50)
EOSINOPHIL #: 0.2 K/uL (ref 0.00–0.50)
EOSINOPHIL %: 4.9 % (ref 0.0–5.2)
HCT: 37.2 % (ref 36.0–48.0)
HGB: 11.8 g/dL — ABNORMAL LOW (ref 12.0–16.0)
LYMPHOCYTE #: 1.3 K/uL (ref 0.7–3.20)
LYMPHOCYTE %: 37.1 % (ref 15.0–43.0)
MCH: 30.4 pg (ref 28.3–34.3)
MCHC: 31.8 g/dL — ABNORMAL LOW (ref 32.0–36.0)
MCV: 95.5 fL — AB (ref 82.0–100.0)
MONOCYTE #: 0.3 10*3/uL (ref 0.20–0.90)
MONOCYTE %: 8.9 % (ref 4.8–12.0)
MPV: 10 fL (ref 7.4–10.45)
MPV: 10 fL (ref 7.4–10.45)
NRBC ABSOLUTE: 0 10*3/uL (ref 0–0.02)
NRBC: 0 /100 WBC (ref 0–0.3)
PLATELET COUNT: 152 K/uL (ref 150–400)
PMN #: 1.7 10*3/uL (ref 1.5–6.5)
PMN %: 48.1 % (ref 43.0–76.0)
RBC: 3.9 M/uL — ABNORMAL LOW (ref 4.0–5.6)
RDW: 13.6 % — AB (ref 11.0–16.0)
WBC: 3.5 10*3/uL — ABNORMAL LOW (ref 4.0–11.0)

## 2013-06-11 LAB — HGA1C (HEMOGLOBIN A1C WITH EST AVG GLUCOSE)
ESTIMATED AVERAGE GLUCOSE: 91 mg/dL (ref 60–100)
GLYCOHEMOGLOBIN: 4.8 % (ref 4.0–6.0)

## 2013-06-12 LAB — COMPREHENSIVE METABOLIC PROFILE - BMC/JMC ONLY
ALBUMIN/GLOBULIN RATIO: 1.4
ALBUMIN: 3.8 g/dL (ref 3.5–5.0)
ALKALINE PHOSPHATASE: 43 IU/L (ref 38–126)
ALKALINE PHOSPHATASE: 43 IU/L (ref 38–126)
ALT (SGPT): 24 IU/L (ref 14–54)
ANION GAP: 6 mmol/L (ref 3–11)
ANION GAP: 6 mmol/L (ref 3–11)
AST (SGOT): 28 IU/L (ref 15–41)
BILIRUBIN, TOTAL: 0.6 mg/dL (ref 0.3–1.2)
BUN: 20 mg/dL (ref 6–20)
CALCIUM: 9 mg/dL (ref 8.8–10.2)
CARBON DIOXIDE: 28 mmol/L (ref 22–32)
CHLORIDE: 107 mmol/L (ref 101–111)
CREATININE: 0.54 mg/dL (ref 0.44–1.00)
ESTIMATED GLOMERULAR FILTRATION RATE: >60 mL/min (ref >60)
GLUCOSE: 87 mg/dL (ref 70–110)
POTASSIUM: 4 mmol/L (ref 3.4–5.1)
SODIUM: 141 mmol/L (ref 136–145)
SODIUM: 141 mmol/L (ref 136–145)
TOTAL PROTEIN: 6.4 g/dL (ref 6.4–8.3)
TOTAL PROTEIN: 6.4 g/dL (ref 6.4–8.3)

## 2013-06-12 LAB — VITAMIN B12: VITAMIN B12: 413 pg/mL (ref 180–914)

## 2013-06-12 LAB — LIPID PANEL
CHOL/HDL RATIO: 2.2
CHOLESTEROL: 220 mg/dL — ABNORMAL HIGH (ref 120–199)
HDL-CHOLESTEROL: 99 mg/dL — ABNORMAL HIGH (ref 45–65)
LDL (CALCULATED): 114 mg/dL (ref <130)
TRIGLYCERIDES: 34 mg/dL — AB (ref 35–135)
VLDL (CALCULATED): 7 mg/dL

## 2013-06-12 LAB — FOLATE: FOLATE: >25 ng/mL (ref >4.5)

## 2013-06-13 LAB — VITAMIN D, SERUM (25 HYDROXYVITAMIN D2 AND D3 BY MS)
25 HYDROXYVITAMIN D2/D3,total: 31.4 ng/mL (ref <100)
25 HYDROXYVITAMIN D2: <4 ng/mL
25 HYDROXYVITAMIN D3: 31.4 ng/mL

## 2013-06-18 ENCOUNTER — Ambulatory Visit (INDEPENDENT_AMBULATORY_CARE_PROVIDER_SITE_OTHER): Payer: No Typology Code available for payment source

## 2013-06-18 ENCOUNTER — Encounter (INDEPENDENT_AMBULATORY_CARE_PROVIDER_SITE_OTHER): Payer: Self-pay

## 2013-06-18 ENCOUNTER — Emergency Department
Admission: EM | Admit: 2013-06-18 | Discharge: 2013-06-18 | Disposition: A | Discharge status: Home / Self Care | Payer: No Typology Code available for payment source | Attending: PHYSICIAN ASSISTANT | Admitting: PHYSICIAN ASSISTANT

## 2013-06-18 ENCOUNTER — Encounter (INDEPENDENT_AMBULATORY_CARE_PROVIDER_SITE_OTHER): Payer: No Typology Code available for payment source

## 2013-06-18 ENCOUNTER — Emergency Department (EMERGENCY_DEPARTMENT_HOSPITAL): Payer: No Typology Code available for payment source

## 2013-06-18 ENCOUNTER — Emergency Department: Payer: No Typology Code available for payment source

## 2013-06-18 VITALS — BP 125/78 | HR 71 | Temp 97.6°F | Resp 16 | Ht 67.0 in | Wt 192.0 lb

## 2013-06-18 DIAGNOSIS — M79601 Pain in right arm: Secondary | ICD-10-CM

## 2013-06-18 DIAGNOSIS — M79641 Pain in right hand: Secondary | ICD-10-CM

## 2013-06-18 DIAGNOSIS — M25521 Pain in right elbow: Secondary | ICD-10-CM

## 2013-06-18 DIAGNOSIS — W010XXA Fall on same level from slipping, tripping and stumbling without subsequent striking against object, initial encounter: Secondary | ICD-10-CM | POA: Insufficient documentation

## 2013-06-18 DIAGNOSIS — N390 Urinary tract infection, site not specified: Secondary | ICD-10-CM

## 2013-06-18 DIAGNOSIS — S5001XA Contusion of right elbow, initial encounter: Secondary | ICD-10-CM

## 2013-06-18 DIAGNOSIS — S5000XA Contusion of unspecified elbow, initial encounter: Secondary | ICD-10-CM | POA: Insufficient documentation

## 2013-06-18 DIAGNOSIS — M79609 Pain in unspecified limb: Secondary | ICD-10-CM

## 2013-06-18 DIAGNOSIS — S59901A Unspecified injury of right elbow, initial encounter: Secondary | ICD-10-CM

## 2013-06-18 MED ORDER — KETOROLAC 30 MG/ML (1 ML) INJECTION SOLUTION
30.00 mg | INTRAMUSCULAR | Status: AC
Start: 2013-06-18 — End: 2013-06-18
  Administered 2013-06-18: 30 mg via INTRAMUSCULAR
  Filled 2013-06-18: qty 1

## 2013-06-18 MED ORDER — HYDROCODONE 5 MG-ACETAMINOPHEN 325 MG TABLET
1.00 | ORAL_TABLET | ORAL | Status: AC
Start: 2013-06-18 — End: 2013-06-18
  Administered 2013-06-18: 1 via ORAL
  Filled 2013-06-18: qty 1

## 2013-06-18 MED ORDER — ONDANSETRON 4 MG DISINTEGRATING TABLET
4.00 mg | ORAL_TABLET | ORAL | Status: AC
Start: 2013-06-18 — End: 2013-06-18
  Administered 2013-06-18: 4 mg via SUBLINGUAL
  Filled 2013-06-18 (×2): qty 1

## 2013-06-18 MED ORDER — HYDROCODONE 7.5 MG-ACETAMINOPHEN 325 MG TABLET
1.00 | ORAL_TABLET | Freq: Four times a day (QID) | ORAL | Status: DC | PRN
Start: 2013-06-18 — End: 2014-07-27

## 2013-06-18 NOTE — Progress Notes (Signed)
Quick Note:    Patient aware, sent ot ED for further eval, see prog note  ______

## 2013-06-18 NOTE — ED Nurses Note (Addendum)
Reports slipping on ice Mon, landing face down, hitting head, knees and right arm. Pt continues to have right arm pain and swelling. + pulses and cap refill. No LOC.

## 2013-06-18 NOTE — Progress Notes (Signed)
BP 125/78   Pulse 71   Temp(Src) 36.4 C (97.6 F) (Oral)   Resp 16   Ht 1.702 m (5\' 7" )   Wt 87.091 kg (192 lb)   BMI 30.06 kg/m2  Kipton Skillen, RT (R) 06/18/2013, 1:49 PM

## 2013-06-18 NOTE — Progress Notes (Signed)
Quick Note:    Patient aware, sent ot ED for further eval, see prog note  ______

## 2013-06-18 NOTE — ED Nurses Note (Signed)
Sling applied per orders for comfort.

## 2013-06-18 NOTE — Progress Notes (Signed)
06/18/13 1600   Urine   QC Urine y   Time collected 1501   Glucose Negative   Bilirubin Negative   Ketones Negative   Specific Gravity 1.020   Blood (urine) Negative   pH 7.0   Protein Negative   Urobilinogen Normal    Nitrite Negative   Leukocytes Negative   Initials kal

## 2013-06-18 NOTE — ED Nurses Note (Signed)
Patient discharged home with family.  AVS reviewed with patient/care giver.  A written copy of the AVS and discharge instructions was given to the patient/care giver.  Questions sufficiently answered as needed.  Patient/care giver encouraged to follow up with PCP as indicated.  In the event of an emergency, patient/care giver instructed to call 911 or go to the nearest emergency room.

## 2013-06-18 NOTE — Progress Notes (Signed)
ID: [name]   DOB: 1951-03-16  Date of Service: 06/18/2013     Chief Complaint(s):   Chief Complaint   Patient presents with    Right Arm Pain     shoulder to elbow       Subjective:      Patient slipped and fell on the ice yesterday injuring her right hand and arm. She is here with continued pain and she states that she is unable to supinate/Friday her arm causing significant pain but she's not quite sure where her. She feels pain from the shoulder through the fingers. She also feels like her thumb is slightly injured but she can't really expand on that. She's here for her x-rays of the right arm and she is comfortable having images from her shoulder through her hand. She has 2 rings on the fingers, both of which stand up easily and slight forward but the PIP joint of the fingers are slightly swollen so she's not able to get the rings off.    We talked about getting her rings off using a ring cutter which is not willing to do. There does not appear to be any neurovascular compromise of the fingers at this time. Unfortunately we do not have the correct supplies to try the shoestring test and remove the rings that way. She would prefer to leave the rings in place and get the x-rays with them in place.    Current Outpatient Prescriptions   Medication Sig    Magnesium Oxide 500 mg Oral Tablet Take 1,000 mg by mouth Once a day    multivitamin Oral Tablet Take 1 Tab by mouth Once a day    Sumatriptan Succinate (IMITREX) 100 mg Oral Tablet Take 1 Tab (100 mg total) by mouth Once, as needed for Migraine for 1 dose May repeat in 2 hours in needed       Allergies   Allergen Reactions    Percocet [Oxycodone-Acetaminophen] Nausea/ Vomiting       Family History   Problem Relation Age of Onset    Congestive Heart Failure Mother     Colon Cancer Mother     Asthma Mother     Diabetes Father     Colon Cancer Father     Asthma Sister     Asthma Brother     No Known Problems Daughter        Active Ambulatory  Problems     Diagnosis Date Noted    Pancytopenia 04/09/2010    Left lower quadrant pain 04/14/2010    Iron deficiency 05/16/2010    Fatigue 07/06/2010    Dyspnea 07/23/2010    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     Resolved Ambulatory Problems     Diagnosis Date Noted    No Resolved Ambulatory Problems     Past Medical History   Diagnosis Date    Breast lump 2005    LLQ abdominal pain     Hx of migraines     Iron deficiency anemia        Social History        Objective:  Vitals Signs are stable   Filed Vitals:    06/18/13 1345   BP: 125/78   Pulse: 71   Temp: 36.4 C (97.6 F)   TempSrc: Oral   Resp: 16   Height: 1.702 m (5\' 7" )   Weight: 87.091 kg (192 lb)       Gen:  no acute distress, non toxic looking, interactive  Musculoskeletal:  Right arm with tenderness on post aspect of olecranon with edema in this region,  with no erythema, no obvious ecchymosis.    Patient is unable to flex the elbow past approximately 20 and she is unable to pronate supinate. She is tenderness over the upper forearm as well as up the posterior aspect of the humerus. Very tender in these regions and exam does not correlate with degree of tenderness    X-ray shoulder elbow wrist and hand are negative per radiology report however the elbow x-rays are not optimal due to difficulty positioning the patient secondary to discomfort  Clinical Impression:           ICD-9-CM    1. Right hand pain 729.5 XR HAND RIGHT SERIES     XR WRIST RIGHT SERIES     XR ELBOW RIGHT SERIES     XR SHOULDER RIGHT SERIES   2. Right arm pain 729.5 XR HAND RIGHT SERIES     XR WRIST RIGHT SERIES     XR ELBOW RIGHT SERIES     XR SHOULDER RIGHT SERIES   3. UTI (lower urinary tract infection) 599.0 POCT URINE DIPSTICK       Orders Placed This Encounter    XR HAND RIGHT SERUES    XR WRIST RIGHT SERIES    XR ELBOW RIGHT SERIES    XR SHOULDER RIGHT SERIES    POCT URINE DIPSTICK         Plan:   1)  We applied the dental floss track of wrapping her fingers to get the  2 rings of the right hand off. This did cause discomfort but we were successful getting her rings off of the hand.    Negative x-rays the plan was to put her in a sling and see how she did over 2-3 days. I provided her with a prescription for Lortab for pain control and encouraged her to continue taking NSAIDs. Ice was also discussed. Unfortunately we were unable to get the patient into a sling due to discomfort and she is really unable to flex the elbow. She reports best position of comfort is with the arm fully extended at her side and I feel uncomfortable discharging her given the difficulty with the patient putting her arm into a position of protection and comfort.    For this reason I have encouraged the patient to proceed to the emergency room where some pain management can be provided and optimal views of the elbow can be obtained. I have spoken with ER provider who graciously agrees to see the patient. Patient will be taken to the hospital by her husband.    Patient asked for urinalysis from recent UTI and the urinalysis today is completely normal/negative.     All questions were answered and patient agrees to plan  Level III

## 2013-06-19 ENCOUNTER — Encounter (INDEPENDENT_AMBULATORY_CARE_PROVIDER_SITE_OTHER): Payer: Self-pay

## 2013-06-19 ENCOUNTER — Encounter (INDEPENDENT_AMBULATORY_CARE_PROVIDER_SITE_OTHER): Payer: Self-pay | Admitting: NURSE PRACTITIONER

## 2013-06-19 NOTE — Addendum Note (Signed)
Addended by: Mikeal Hawthorne LEE on: 06/19/2013 06:39 PM     Modules accepted: Orders

## 2013-06-19 NOTE — Progress Notes (Signed)
Patient was seen in Urgent Care yesterday. Follow-up call made and patient's status remains unchanged.  She was seen in ED after her visit here at Dr. Audrie Gallus advice. Patient was given a sling in the ED and told to follow up with ortho if she is not better in 10 days. Patient is still in pain. X-ray was reviewed and Dr. Olivia Mackie advised patient to come back in for splinting of possible fracture.  Patient in agreement and will RTC for splinting. Patient advised to obtain ortho appointment ASAp.Rolena Infante, RN 06/19/2013, 8:16 PM

## 2013-06-19 NOTE — Progress Notes (Signed)
I have placed [name] in a posterior splint and sling today with home care instructions to keep the splint dry, watch for swelling and circulation and to follow up with orthopaedics per Dr. Olivia Mackie. She understands and will call us should she have any further concerns.   Jerl Mina, RTR 06/19/2013, 8:07 PM

## 2013-06-20 ENCOUNTER — Encounter (INDEPENDENT_AMBULATORY_CARE_PROVIDER_SITE_OTHER): Payer: Self-pay | Admitting: NURSE PRACTITIONER

## 2013-06-20 NOTE — Progress Notes (Addendum)
Patient was asked to return to UC after speaking with her in f/u call pain persists despite negative xray reading patient was brought back here for a posterior splint to immobilize until she is seen by orthopedics advised to check cap refill should she develop increased pain, swelling or coolness of the extremity to remove and seek care immediately     Rayburn Ma, [place] 06/20/2013, 9:54 PM

## 2013-06-21 NOTE — ED Provider Notes (Signed)
[facility]  Emergency Department     HISTORY OF PRESENT ILLNESS     Date:  06/21/2013  Patient's Name:  [name]  Date of Birth:  06-26-1950    HPI Comments: 63 y/o F presents c/o right elbow pain after injury 2 days ago. Pt slipped on ice and landed on her elbow. Denies hitting her head. No LOC. Pain described as sharp, constant, rated 8/10, worse with movement and palpation. Pt was referred from Urgent Care for pain control to obtain better images once ROM improves.       Review of Systems     Review of Systems   Skin: Negative for color change, pallor, rash and wound.       Previous History     Past Medical History:  Past Medical History   Diagnosis Date    Breast lump 2005     Resolved prior to biopsy    LLQ abdominal pain     Hx of migraines     Iron deficiency anemia     Pancytopenia        Past Surgical History:  Past Surgical History   Procedure Laterality Date    Hx cesarean section  1993    Hx tonsillectomy  1955    Hx colonoscopy      Hx endoscopic sinus surgery         Social History:  History   Substance Use Topics    Smoking status: Never Smoker     Smokeless tobacco: Not on file    Alcohol Use: No     History   Drug Use No       Family History:  Family History   Problem Relation Age of Onset    Congestive Heart Failure Mother     Colon Cancer Mother     Asthma Mother     Diabetes Father     Colon Cancer Father     Asthma Sister     Asthma Brother     No Known Problems Daughter        Medication History:  Current Outpatient Prescriptions   Medication Sig    HYDROcodone-acetaminophen (NORCO) 7.5-325 mg Oral Tablet Take 1 Tab by mouth Every 6 hours as needed for Pain Earliest Fill Date: 06/18/13    Magnesium Oxide 500 mg Oral Tablet Take 1,000 mg by mouth Once a day    multivitamin Oral Tablet Take 1 Tab by mouth Once a day    Sumatriptan Succinate (IMITREX) 100 mg Oral Tablet Take 1 Tab (100 mg total) by mouth Once, as needed for  Migraine for 1 dose May repeat in 2 hours in needed       Allergies:  Allergies   Allergen Reactions    Percocet [Oxycodone-Acetaminophen] Nausea/ Vomiting       Physical Exam     Vitals:    BP 146/89   Pulse 68   Temp(Src) 36.7 C (98 F)   Resp 18   Ht 1.702 m (5' 7.01")   Wt 87.091 kg (192 lb)   BMI 30.06 kg/m2   SpO2 100%    Physical Exam   Nursing note and vitals reviewed.  Constitutional: She is oriented to person, place, and time. She appears well-developed and well-nourished. She appears distressed (moderate, noted by facial grimace).   HENT:   Head: Normocephalic and atraumatic.   Mouth/Throat: Oropharynx is clear and moist.   Eyes: Pupils are equal, round, and reactive to  light.   Neck: Normal range of motion.   Cardiovascular: Normal rate and regular rhythm.    Musculoskeletal:   TTP over right olecranon and bilateral epicondyles. Decreased ROM secondary to pain. Not able to flex or supinate/pronate. No erythema, edema, or ecchymosis. NV intact distally with 2+ radial pulse and CR < 2 seconds.   Neurological: She is alert and oriented to person, place, and time.   No sensory deficit.   Skin: Skin is warm and dry. No rash noted.   Psychiatric: Her behavior is normal.       Diagnostic Studies/Treatment     Medications:  Medications   HYDROcodone-acetaminophen (NORCO) 5-325 mg per tablet (1 Tab Oral Given 06/18/13 1800)   ketorolac (TORADOL) 30 mg/mL injection (30 mg Intramuscular Given 06/18/13 1706)   ondansetron (ZOFRAN ODT) rapid dissolve tablet (4 mg Sublingual Given 06/18/13 1707)       Discharge Medication List as of 06/18/2013  6:05 PM          Labs:    No results found for any visits on 06/18/13.    Radiology:  XR ELBOW RIGHT SERIES    XR ELBOW RIGHT SERIES    Final Result:       Mild osteoarthritic degenerative changes with no evidence of acute     fracture or dislocation.                  XR ELBOW RIGHT SERIES    (Canceled)       ECG:  NONE      Procedure     Procedures    Course/Disposition/Plan          Course: XR with no acute fracture. Pain improved following IM Toradol and PO narcotics. Able to perform limited ROM. Sling applied for comfort. Discussed RICE therapy. To f/u with Ortho. Pt in agreement with plan. Pt is stable. Return precautions discussed.    Disposition:   Discharged    Follow up:   Charlane Ferretti, [address]  [place] [address]  [phone]      Orthopedics. In 2-3 days. Rest. Apply ice as tolerated. Wear sling for comfort. Take Motrin 400 mg every 8 hours as needed for pain/swelling.    Scherrie Gerlach, [place] [address]  [phone]          [facility]  [address]  [place] [address]  [phone]    return for fever, increased swelling, redness over the elbow, or for any other concerns.      Clinical Impression:     Encounter Diagnoses   Name Primary?    Elbow pain, right Yes    Contusion of elbow, right        Future Appointments Scheduled in Epic:  Future Appointments  Date Time Provider [place]   08/11/2013 12:00 PM [name], MD [facility]

## 2013-07-16 ENCOUNTER — Other Ambulatory Visit (HOSPITAL_BASED_OUTPATIENT_CLINIC_OR_DEPARTMENT_OTHER): Payer: Self-pay | Admitting: Family Medicine

## 2013-07-16 ENCOUNTER — Ambulatory Visit
Admission: RE | Admit: 2013-07-16 | Discharge: 2013-07-16 | Disposition: A | Discharge status: Home / Self Care | Payer: No Typology Code available for payment source | Source: Ambulatory Visit | Attending: Family Medicine | Admitting: Family Medicine

## 2013-07-16 DIAGNOSIS — R5381 Other malaise: Secondary | ICD-10-CM | POA: Insufficient documentation

## 2013-07-16 DIAGNOSIS — D508 Other iron deficiency anemias: Secondary | ICD-10-CM | POA: Insufficient documentation

## 2013-07-16 DIAGNOSIS — R5383 Other fatigue: Secondary | ICD-10-CM | POA: Insufficient documentation

## 2013-07-16 DIAGNOSIS — Z1231 Encounter for screening mammogram for malignant neoplasm of breast: Secondary | ICD-10-CM

## 2013-07-16 LAB — CBC
BASOPHIL #: 0 10*3/uL (ref 0.0–0.10)
BASOPHILS %: 0.6 % (ref 0–2.50)
EOSINOPHIL #: 0.2 10*3/uL (ref 0.00–0.50)
EOSINOPHIL %: 3.5 % (ref 0.0–5.2)
HCT: 37.7 % (ref 36.0–48.0)
HGB: 11.7 g/dL — ABNORMAL LOW (ref 12.0–16.0)
LYMPHOCYTE #: 1.7 10*3/uL (ref 0.7–3.20)
LYMPHOCYTE %: 31.5 % (ref 15.0–43.0)
MCH: 29.7 pg (ref 28.3–34.3)
MCHC: 31.1 g/dL — ABNORMAL LOW (ref 32.0–36.0)
MCV: 95.5 fL (ref 82.0–100.0)
MONOCYTE #: 0.5 10*3/uL (ref 0.20–0.90)
MONOCYTE %: 9.2 % (ref 4.8–12.0)
MPV: 9.8 fL (ref 7.4–10.45)
NRBC ABSOLUTE: 0.01 10*3/uL (ref 0–0.02)
NRBC ABSOLUTE: 0.01 K/uL (ref 0–0.02)
NRBC: 0.1 /100{WBCs} (ref 0–0.3)
PLATELET COUNT: 162 10*3/uL (ref 150–400)
PMN #: 3 K/uL (ref 1.5–6.5)
PMN %: 55.2 % (ref 43.0–76.0)
RBC: 3.94 M/uL — ABNORMAL LOW (ref 4.0–5.6)
RDW: 13.1 % (ref 11.0–16.0)
WBC: 5.5 K/uL (ref 4.0–11.0)

## 2013-07-17 LAB — FERRITIN
FERRITIN: 18.2 ng/mL (ref 11.0–306.8)
FERRITIN: 18.2 ng/mL (ref 11.0–306.8)

## 2013-07-17 LAB — TRANSFERRIN: TRANSFERRIN: 253 mg/dL (ref 192–282)

## 2013-07-17 LAB — IRON: IRON: 77 ug/dL (ref 28–170)

## 2013-07-17 LAB — TRANSFERRIN SATURATION: TRANSFERRIN SATURATION: 21 % (ref 15–50)

## 2013-07-17 LAB — TSH CASCADE,3RD GEN WITH REFLEX TO FT4: TSH CASCADE: 1.98 u[IU]/mL (ref 0.340–5.6)

## 2013-07-18 ENCOUNTER — Encounter (HOSPITAL_COMMUNITY): Payer: Self-pay | Admitting: Family Medicine

## 2013-07-28 ENCOUNTER — Other Ambulatory Visit (HOSPITAL_BASED_OUTPATIENT_CLINIC_OR_DEPARTMENT_OTHER): Payer: Self-pay | Admitting: Family Medicine

## 2013-07-28 DIAGNOSIS — Z1239 Encounter for other screening for malignant neoplasm of breast: Secondary | ICD-10-CM

## 2013-08-06 ENCOUNTER — Other Ambulatory Visit (HOSPITAL_BASED_OUTPATIENT_CLINIC_OR_DEPARTMENT_OTHER): Payer: No Typology Code available for payment source

## 2013-08-11 ENCOUNTER — Ambulatory Visit (INDEPENDENT_AMBULATORY_CARE_PROVIDER_SITE_OTHER): Payer: No Typology Code available for payment source | Admitting: Specialist

## 2013-08-11 ENCOUNTER — Encounter (INDEPENDENT_AMBULATORY_CARE_PROVIDER_SITE_OTHER): Payer: Self-pay | Admitting: Specialist

## 2013-08-11 VITALS — BP 114/62 | HR 63 | Ht 67.0 in | Wt 194.0 lb

## 2013-08-11 DIAGNOSIS — G43909 Migraine, unspecified, not intractable, without status migrainosus: Secondary | ICD-10-CM

## 2013-08-11 DIAGNOSIS — G43009 Migraine without aura, not intractable, without status migrainosus: Secondary | ICD-10-CM

## 2013-08-11 DIAGNOSIS — IMO0002 Reserved for concepts with insufficient information to code with codable children: Secondary | ICD-10-CM

## 2013-08-11 DIAGNOSIS — G43919 Migraine, unspecified, intractable, without status migrainosus: Secondary | ICD-10-CM

## 2013-08-11 DIAGNOSIS — G479 Sleep disorder, unspecified: Secondary | ICD-10-CM

## 2013-08-11 DIAGNOSIS — M792 Neuralgia and neuritis, unspecified: Secondary | ICD-10-CM

## 2013-08-11 MED ORDER — ZONISAMIDE 25 MG CAPSULE
ORAL_CAPSULE | ORAL | Status: DC
Start: 2013-08-11 — End: 2013-12-15

## 2013-08-11 MED ORDER — ZONISAMIDE 100 MG CAPSULE
ORAL_CAPSULE | ORAL | Status: DC
Start: 2013-08-11 — End: 2014-07-27

## 2013-08-11 MED ORDER — TIZANIDINE 4 MG TABLET
ORAL_TABLET | ORAL | Status: DC
Start: 2013-08-11 — End: 2014-07-27

## 2013-08-11 MED ORDER — ZOLMITRIPTAN 5 MG TABLET
ORAL_TABLET | ORAL | Status: DC
Start: 2013-08-11 — End: 2014-07-27

## 2013-08-11 NOTE — Patient Instructions (Signed)
What Can You Do For Your Headaches    The management of headache disorders consists of many factors.  Medications are only part of the solution, and alone will be limited in effectiveness.  There are many other factors that will be your responsibility to manage.  It will likely only be through our combined efforts that you will achieve satisfactory improvement in your head pain and quality of life.  Below are some suggestions of how you can help yourself and help the [facility] Center help you.    1. Unless told otherwise, you should discontinue the use of any over-the-counter (OTC) medication that you use for headaches.  OTC's can quickly backfire and lead to increased frequency of headaches, even when used only once or twice per week.  2. Unless told otherwise, you should discontinue the use of any prescription pain medication such as opiates (narcotics) or fiorinal/fioricet for the same reason as above.  3. Getting regular, refreshing sleep is very important.  Set a specific bedtime that would allow for 8 to 8  hours of sleep.  Do not watch television, listen to the radio, do work, or argue in bed.  If your bed partner snores, wear earplugs or sleep in another room.  Have the same bedtime on the weekend as you have during the week.  4. Eat a well-balanced and healthy diet.  Avoid common food triggers like Monosodium Glutamate (MSG) and Nitrites.  These are commonly found in snack foods like potato chips, hot dogs, bacon, and other processed meats.  Decrease your intake of red meat and increase chicken or fish.  Drink 6-8 glasses of water per day.  5. Get regular exercise.  A brisk walk for 30 minutes 3-4 days per week is enough at the beginning.  Improving your heart health and improving your blood flow will have beneficial effects on your headaches.  6. Eliminate Caffeine from your diet.  Caffeine is one of the worst chemicals for anyone with headaches.  Even only one cup per day can be enough to  increase the frequency or severity of your headaches.  Be aware that even decaffeinated coffee and tea have some caffeine in them.  7.  Find someone with whom you can talk about your headaches and other stresses or concerns.  This could be a religious leader, professional counselor, psychologist, or even just a good friend.  It is important to be able to talk openly and honestly about what you are going through.  8. Begin a stress reduction program with meditation, prayer, or biofeedback.  Many resources for biofeedback and relaxation are available on the internet.  9. Being involved in your regular activities despite the headaches is necessary.  Make every effort to go to work, school, or social activities even if you have a headache.  You cannot let your headaches control you - you must control them and not allow them to dictate to you how you will live.  10. Avoid "Natural" cures for your headache unless you discuss them with the [facility] first.  Many products claiming to cure or treat headaches can actually be detrimental to your headaches.

## 2013-08-11 NOTE — Progress Notes (Signed)
RE:  [name]          [birth year]    Dear Dr. Candee Furbish, MD:     I had the pleasure of seeing [name] at the [facility] on 08/11/2013 for the evaluation of headaches.  As you are aware, she is a 63 y.o. year old female who comes to clinic with a complaint of headaches.  These headaches began approximately 40 years ago.  They occur approximately 13 times per month and can last up to multiple days at a time. They are located on left parietal and then spreads to whole head, and she will develop pain in her teeth and are described as a throbbing type of pain.  They are Moderate, Severe in intensity .  At worst they are 10/10 and average 7/10.   Her headaches start with a runny nose.  She will often get a headache at 4am in the morning.  Associated symptoms include aggravation by movement, nausea, photophobia, phonophobia and vomiting.  The pain is triggered by weather changes.  The patient is currently treating her headaches with magnesium and sumatriptan.  Past medications tried include propanolol, amitriptyline, nortriptyline and topiramate,sumatriptan, sumatriptan injection, rizatriptan, zolmitriptan, naratriptan, frovatriptan, eletriptan, almotriptan, treximet, butalbital, ibuprofen and naproxen.   There is a family history of headaches.  As part of her workup for other conditions, she has been found to have MTHFR.    She  has a past medical history of Breast lump (2005); LLQ abdominal pain; migraines; Iron deficiency anemia; and Pancytopenia.  She is homozygous for MTHFR.    Allergies: Percocet     Medications:  Current Outpatient Prescriptions   Medication Sig Dispense Refill    HYDROcodone-acetaminophen (NORCO) 7.5-325 mg Oral Tablet Take 1 Tab by mouth Every 6 hours as needed for Pain Earliest Fill Date: 06/18/13  20 Tab  0    Magnesium Oxide 500 mg Oral Tablet Take 1,000 mg by mouth Once a day        multivitamin Oral Tablet Take 1 Tab by mouth Once a day         Sumatriptan Succinate (IMITREX) 100 mg Oral Tablet Take 1 Tab (100 mg total) by mouth Once, as needed for Migraine for 1 dose May repeat in 2 hours in needed  60 Tab  3    tiZANidine (ZANAFLEX) 4 mg Oral Tablet Take 0.5 - 2tabs PO q8h prn headache  30 Tab  5    Zolmitriptan (ZOMIG) 5 mg Oral Tablet Take at onset of headache, may repeat in 2 hours if needed.  Max 2/d, max 3d/wk  9 Tab  11    zonisamide (ZONEGRAN) 100 mg Oral Capsule 1 qhs x 14d, then 2 qhs  60 Cap  11    zonisamide (ZONEGRAN) 25 mg Oral Capsule 1 qhs x 7d, then 2 qhs x 7d  21 Cap  0     No current facility-administered medications for this visit.     Her family history includes Asthma in her brother, mother, and sister; Colon Cancer in her father and mother; Congestive Heart Failure in her mother; Diabetes in her father; No Known Problems in her daughter.  She  reports that she has never smoked. She does not have any smokeless tobacco history on file. She reports that she does not drink alcohol or use illicit drugs.     Review of systems was positive for insomnia, snoring, sore legs at night, waking repeatedly at night, grinding teeth  in sleep, easy bruising, balance difficulty, excessive urination, fatigue, anemia, joint pain, low energy.  The rest of 10 systems have been reviewed and are negative except as per the HPI.    Filed Vitals:    08/11/13 1147   BP: 114/62   Pulse: 63   Height: 1.702 m (5\' 7" )   Weight: 87.998 kg (194 lb)     Physical Exam revealed pt to be normocephalic/atraumatric with normal heart sounds and rate, clear respirations, and no carotid bruits.  Extremity exam showed no edema.  Ophthalmologic exam showed no papilledema.  Neurologic exam:  Pt had intact cognition with intact orientation, good knowledge, good recent and remote memory, full language, clear speech, and normal attention.  Cranial nerve exam showed pupils to equal and reactive, normal conjugate eye movements, normal facial sensation and symmetric facial  muscles.  Hearing was intact bilaterally, shoulder shrug was strong, and tongue protruded in the midline with normal palatal elevation.  Patient had normal, steady, narrow gait, intact finger-nose testing, and symmetrically intact sensation throughout.  The upper and lower extremities showed normal tone and strength, including deltoids, biceps, grip, hip flexors, knee extension, and ankle flexion.  Reflexes were symmetric in upper and lower extremities without pathologic reflexes.     I have reviewed her records as outlined below.  She reports head ct was normal.     Lab Results   Component Value Date    CHOLESTEROL 220* 06/11/2013    HDLCHOL 99* 06/11/2013    LDLCHOL 114 06/11/2013    TRIG 34* 06/11/2013        My impression and plan are as follows:    ICD-9-CM    1. Migraine 346.90 AMB CONSULT/REFERRAL NEUROLOGY   2. Neuropathic pain 729.2 AMB CONSULT/REFERRAL NEUROLOGY   3. 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 Sleep disorder 780.50 zonisamide (ZONEGRAN) 25 mg Oral Capsule     zonisamide (ZONEGRAN) 100 mg Oral Capsule      She has a long history of intractable migraine.  I don't think the MTHFR is actually related.  She will try zonegran for prevention.  Zomig as needed.  Tizanidine rescue.  She is almost a candidate for botox.  Her sleep is an issue.  She could likely benefit from a real overnight sleep study rather than the at home study that peia allows.    [name], MD 08/11/2013, 13:04      Zella Richer. Shon Baton MD  Director, [facility]  Associate Professor of Neurology  [facility]

## 2013-09-01 ENCOUNTER — Ambulatory Visit (INDEPENDENT_AMBULATORY_CARE_PROVIDER_SITE_OTHER): Payer: Self-pay | Admitting: Specialist

## 2013-09-01 NOTE — Telephone Encounter (Signed)
.  Please advise.Rayford Halsted, LPN 12/15/4578, 99:83

## 2013-09-01 NOTE — Telephone Encounter (Signed)
Message copied by Rayford Halsted on Mon Sep 01, 2013 10:19 AM  ------       Message from: Myrtha Mantis       Created: Mon Sep 01, 2013  9:17 AM         >> Otila Kluver [facility] 09/01/2013 09:16 AM       Dr. Shon Baton pt       Patient called stating that she has had a severe migraine since Friday evening, she said her husband called & spoke to the physician on call & was told to have Ivin Booty call the office.  She said with the migraine this weekend she had aura, flashing lights and severe vomiting, she it is just now starting to ease up a little bit.  She said since seeing Dr. Shon Baton on 3/9 she has had a migraine everyday with the exception of 3 days without.  Please call her to advise.              Preferred Pharmacy         [place]         [place] [address]         Phone: [phone] Fax: [phone]         Open 24 Hours?: No                Last apt date 08/11/13       Next apt date 12/15/13         ------

## 2013-09-02 ENCOUNTER — Ambulatory Visit (HOSPITAL_BASED_OUTPATIENT_CLINIC_OR_DEPARTMENT_OTHER)
Admission: RE | Admit: 2013-09-02 | Discharge: 2013-09-02 | Disposition: A | Discharge status: Home / Self Care | Payer: No Typology Code available for payment source | Source: Ambulatory Visit | Attending: Family Medicine | Admitting: Family Medicine

## 2013-09-02 ENCOUNTER — Other Ambulatory Visit (HOSPITAL_BASED_OUTPATIENT_CLINIC_OR_DEPARTMENT_OTHER): Payer: Self-pay | Admitting: Family Medicine

## 2013-09-02 DIAGNOSIS — L0231 Cutaneous abscess of buttock: Secondary | ICD-10-CM | POA: Insufficient documentation

## 2013-09-02 DIAGNOSIS — J209 Acute bronchitis, unspecified: Secondary | ICD-10-CM

## 2013-09-02 DIAGNOSIS — L03317 Cellulitis of buttock: Principal | ICD-10-CM | POA: Insufficient documentation

## 2013-09-02 LAB — COMPREHENSIVE METABOLIC PROFILE - BMC/JMC ONLY
ALBUMIN/GLOBULIN RATIO: 1.4
ALBUMIN: 3.9 g/dL (ref 3.2–5.0)
ALKALINE PHOSPHATASE: 49 IU/L (ref 35–120)
ALT (SGPT): 19 IU/L (ref 0–55)
ANION GAP: 10 mmol/L (ref 3–11)
AST (SGOT): 28 IU/L (ref 0–45)
BILIRUBIN, TOTAL: 0.7 mg/dL (ref 0.0–1.3)
BUN: 16 mg/dL (ref 6–22)
CALCIUM: 9.4 mg/dL (ref 8.5–10.5)
CARBON DIOXIDE: 24 mmol/L (ref 22–32)
CARBON DIOXIDE: 24 mmol/L (ref 22–32)
CHLORIDE: 104 mmol/L (ref 101–111)
CREATININE: 0.58 mg/dL (ref 0.53–1.00)
ESTIMATED GLOMERULAR FILTRATION RATE: >60 mL/min (ref >60)
GLUCOSE: 99 mg/dL (ref 70–110)
POTASSIUM: 3.6 mmol/L (ref 3.5–5.0)
SODIUM: 138 mmol/L (ref 136–145)
TOTAL PROTEIN: 6.6 g/dL (ref 6.0–8.0)

## 2013-09-02 LAB — CBC W/ AUTOMATED DIFF - BMC ONLY
BASOPHIL #: 0 10*3/uL (ref 0.0–0.1)
BASOPHILS %: 0.5 % (ref 0.0–2.5)
EOSINOPHIL #: 0 10*3/uL (ref 0.0–0.5)
EOSINOPHIL %: 0.8 % (ref 0.0–5.2)
HCT: 40.2 % (ref 36.0–45.0)
HGB: 13.3 g/dL (ref 12.0–15.5)
LYMPHOCYTE #: 0.8 K/uL (ref 0.7–3.2)
LYMPHOCYTE %: 13.1 % — ABNORMAL LOW (ref 15.0–43.0)
MCH: 31 pg (ref 28.3–34.3)
MCHC: 33 g/dL (ref 32.0–36.0)
MCV: 94.1 fL (ref 82.0–97.0)
MONOCYTE #: 0.5 10*3/uL (ref 0.2–0.9)
MONOCYTE %: 8.4 % (ref 4.8–12.0)
MPV: 9.6 fL (ref 7.4–10.5)
NRBC ABSOLUTE: 0.01 10*3/uL (ref 0–0.02)
NRBC: 0.1 /100 WBC (ref 0–0.3)
NRBC: 0.1 /100{WBCs} (ref 0–0.3)
PLATELET COUNT: 145 10*3/uL — ABNORMAL LOW (ref 150–450)
PMN #: 4.5 10*3/uL (ref 1.5–6.5)
PMN %: 77.2 % — ABNORMAL HIGH (ref 43.0–76.0)
RBC: 4.27 M/uL (ref 4.00–5.10)
RDW: 13.4 % (ref 10.5–14.5)
WBC: 5.8 K/uL (ref 4.0–11.0)

## 2013-09-02 LAB — C-REACTIVE PROTEIN(CRP),INFLAMMATION: C-REACTIVE PROTEIN (CRP),INFLAMMATION: 41.9 mg/L (ref 0.0–8.0)

## 2013-09-09 ENCOUNTER — Other Ambulatory Visit (HOSPITAL_COMMUNITY): Payer: Self-pay | Admitting: Family Medicine

## 2013-09-09 ENCOUNTER — Ambulatory Visit (HOSPITAL_COMMUNITY): Admission: RE | Admit: 2013-09-09 | Payer: No Typology Code available for payment source | Source: Ambulatory Visit

## 2013-09-09 ENCOUNTER — Ambulatory Visit (HOSPITAL_BASED_OUTPATIENT_CLINIC_OR_DEPARTMENT_OTHER)
Admission: RE | Admit: 2013-09-09 | Discharge: 2013-09-09 | Disposition: A | Discharge status: Home / Self Care | Payer: No Typology Code available for payment source | Source: Ambulatory Visit | Attending: Family Medicine | Admitting: Family Medicine

## 2013-09-09 ENCOUNTER — Other Ambulatory Visit (HOSPITAL_BASED_OUTPATIENT_CLINIC_OR_DEPARTMENT_OTHER): Payer: Self-pay | Admitting: Family Medicine

## 2013-09-09 ENCOUNTER — Ambulatory Visit
Admission: RE | Admit: 2013-09-09 | Discharge: 2013-09-09 | Disposition: A | Discharge status: Home / Self Care | Payer: No Typology Code available for payment source | Source: Ambulatory Visit | Attending: Family Medicine | Admitting: Family Medicine

## 2013-09-09 DIAGNOSIS — Z1231 Encounter for screening mammogram for malignant neoplasm of breast: Secondary | ICD-10-CM | POA: Insufficient documentation

## 2013-09-09 DIAGNOSIS — Z1239 Encounter for other screening for malignant neoplasm of breast: Secondary | ICD-10-CM

## 2013-09-09 DIAGNOSIS — Z9889 Other specified postprocedural states: Secondary | ICD-10-CM | POA: Insufficient documentation

## 2013-09-09 MED ORDER — METHYLPREDNISOLONE 4 MG TABLETS IN A DOSE PACK
ORAL_TABLET | ORAL | Status: DC
Start: 2013-09-09 — End: 2013-11-10

## 2013-09-09 NOTE — Telephone Encounter (Signed)
Will order medrol dose pack.  Need to give the changes more time to work.

## 2013-09-09 NOTE — Telephone Encounter (Signed)
P/s headache has subsided and is doing better.  Pt was advised to hold on the Medrol dose pack.Marland KitchenMarland KitchenRachael Corliss Blacker, LPN  08/08/4654, 81:27

## 2013-10-01 ENCOUNTER — Ambulatory Visit (HOSPITAL_BASED_OUTPATIENT_CLINIC_OR_DEPARTMENT_OTHER)
Admission: RE | Admit: 2013-10-01 | Discharge: 2013-10-01 | Disposition: A | Discharge status: Home / Self Care | Payer: No Typology Code available for payment source | Source: Ambulatory Visit | Attending: Family Medicine | Admitting: Family Medicine

## 2013-10-01 ENCOUNTER — Other Ambulatory Visit (HOSPITAL_BASED_OUTPATIENT_CLINIC_OR_DEPARTMENT_OTHER): Payer: Self-pay | Admitting: Family Medicine

## 2013-10-01 DIAGNOSIS — J209 Acute bronchitis, unspecified: Secondary | ICD-10-CM

## 2013-10-05 ENCOUNTER — Encounter (INDEPENDENT_AMBULATORY_CARE_PROVIDER_SITE_OTHER): Payer: Self-pay | Admitting: Specialist

## 2013-10-23 ENCOUNTER — Other Ambulatory Visit: Payer: Self-pay | Admitting: Family

## 2013-10-23 DIAGNOSIS — Q628 Other congenital malformations of ureter: Secondary | ICD-10-CM

## 2013-11-10 ENCOUNTER — Encounter (INDEPENDENT_AMBULATORY_CARE_PROVIDER_SITE_OTHER): Payer: Self-pay | Admitting: PHYSICIAN ASSISTANT

## 2013-11-10 ENCOUNTER — Ambulatory Visit (INDEPENDENT_AMBULATORY_CARE_PROVIDER_SITE_OTHER): Payer: No Typology Code available for payment source | Admitting: PHYSICIAN ASSISTANT

## 2013-11-10 VITALS — BP 124/76 | HR 85 | Temp 98.2°F | Resp 20 | Ht 67.5 in | Wt 180.0 lb

## 2013-11-10 DIAGNOSIS — R0602 Shortness of breath: Secondary | ICD-10-CM

## 2013-11-10 DIAGNOSIS — R0781 Pleurodynia: Principal | ICD-10-CM

## 2013-11-10 DIAGNOSIS — R071 Chest pain on breathing: Secondary | ICD-10-CM

## 2013-11-10 DIAGNOSIS — R059 Cough, unspecified: Secondary | ICD-10-CM

## 2013-11-10 DIAGNOSIS — R05 Cough: Secondary | ICD-10-CM

## 2013-11-10 DIAGNOSIS — R058 Other specified cough: Secondary | ICD-10-CM

## 2013-11-10 MED ORDER — LEVOFLOXACIN 500 MG TABLET
500.0000 mg | ORAL_TABLET | Freq: Every day | ORAL | Status: AC
Start: 2013-11-10 — End: 2013-11-15

## 2013-11-10 MED ORDER — PREDNISONE 50 MG TABLET
50.0000 mg | ORAL_TABLET | Freq: Every day | ORAL | Status: DC
Start: 2013-11-10 — End: 2013-12-15

## 2013-11-10 NOTE — Progress Notes (Signed)
BP 124/76    Pulse 85    Temp(Src) 36.8 C (98.2 F) (Oral)    Resp 20    Ht 1.715 m (5' 7.5")    Wt 81.647 kg (180 lb)    BMI 27.76 kg/m2      SpO2 99%

## 2013-11-10 NOTE — Progress Notes (Addendum)
ID: [name]   DOB: 1950-10-31  Date of Service: 11/10/2013     Chief Complaint(s):   Chief Complaint   Patient presents with    Shortness of Breath    Hurts To Breathe       Subjective:             63 y.o. female presents today c/o Shortness of Breath; and Hurts To Breathe.  Pt states that she had bronchitis the end of Feburary and was treated with 2 rounds of antibiotics and albuterol inhaler by her PCP, pt states that the albuterol inhaler did not help at all.  Her PCP wanted to put her on steroids but she refused b/c her brother had a bad reaction to oral steroids.  She never got any better after the antibiotic treatment, continues with sob and chest pain when she breaths in and out, has also started with productive cough 2 days ago.  Tried to get appointment with PCP but has no appointments until July.  Denies any recent travel, never smoked but her husbands smokes outside the house, no h/o asthma. X-ray dated 09/30/2013 was normal.     ROS: + productive cough, sob, pleuritic chest pain.  Denies fever, chills, heart palpitations, wheezing, abdominal pain,n/v/d.         Current Outpatient Prescriptions   Medication Sig    albuterol sulfate (PROVENTIL OR VENTOLIN) 90 mcg/actuation Inhalation HFA Aerosol Inhaler Take 1-2 Puffs by inhalation Every 6 hours as needed    HYDROcodone-acetaminophen (NORCO) 7.5-325 mg Oral Tablet Take 1 Tab by mouth Every 6 hours as needed for Pain Earliest Fill Date: 06/18/13    levofloxacin (LEVAQUIN) 500 mg Oral Tablet Take 1 Tab (500 mg total) by mouth Once a day for 5 days    Magnesium Oxide 500 mg Oral Tablet Take 1,000 mg by mouth Once a day    multivitamin Oral Tablet Take 1 Tab by mouth Once a day    predniSONE (DELTASONE) 50 mg Oral Tablet Take 1 Tab (50 mg total) by mouth Once a day    Sumatriptan Succinate (IMITREX) 100 mg Oral Tablet Take 1 Tab (100 mg total) by mouth Once, as needed for Migraine for 1 dose May repeat in 2 hours in needed    tiZANidine  (ZANAFLEX) 4 mg Oral Tablet Take 0.5 - 2tabs PO q8h prn headache    Zolmitriptan (ZOMIG) 5 mg Oral Tablet Take at onset of headache, may repeat in 2 hours if needed.  Max 2/d, max 3d/wk    zonisamide (ZONEGRAN) 100 mg Oral Capsule 1 qhs x 14d, then 2 qhs    zonisamide (ZONEGRAN) 25 mg Oral Capsule 1 qhs x 7d, then 2 qhs x 7d       Allergies   Allergen Reactions    Percocet [Oxycodone-Acetaminophen] Nausea/ Vomiting       Family History   Problem Relation Age of Onset    Congestive Heart Failure Mother     Colon Cancer Mother     Asthma Mother     Diabetes Father     Colon Cancer Father     Asthma Sister     Asthma Brother     No Known Problems Daughter        Active Ambulatory Problems     Diagnosis Date Noted    Pancytopenia 04/09/2010    Left lower quadrant pain 04/14/2010    Iron deficiency 05/16/2010    Fatigue 07/06/2010    Dyspnea 07/23/2010  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     Resolved Ambulatory Problems     Diagnosis Date Noted    No Resolved Ambulatory Problems     Past Medical History   Diagnosis Date    Breast lump 2005    LLQ abdominal pain     Hx of migraines     Iron deficiency anemia        Social History        Objective:  Vitals Signs are stable   Filed Vitals:    11/10/13 1245   BP: 124/76   Pulse: 85   Temp: 36.8 C (98.2 F)   TempSrc: Oral   Resp: 20   Height: 1.715 m (5' 7.5")   Weight: 81.647 kg (180 lb)   SpO2: 99%       Gen:  WDWN, no acute distress, non toxic looking, interactive  HEENT:  TMs are normal, oropharynx is without exudate or erythema, the neck is supple with no adenopathy  Heart:  Regular rate and rhythm without murmur  Lungs:  Clear to auscultation bilaterally no wheeze  Skin:  No obvious rash  Abdomen:  Soft, non-tender, no mass, no rigidity/guarding/rebound, +bs    Assessment:            ICD-9-CM   1. Pleuritic chest pain 786.52   2. SOB (shortness of breath) 786.05   3. Cough productive of purulent sputum 786.2       Orders Placed This Encounter     levofloxacin (LEVAQUIN) 500 mg Oral Tablet    predniSONE (DELTASONE) 50 mg Oral Tablet         Plan:   1)   Recent cxr was normal, will start levaquin 500 mg QD and prednisone 50 mg.  Advised to pt go to ED if symptoms worsen or fail to resolve.  F/u with PCP ASAP.     Follow up with [place] immediately if patient worsens or status changes significantly otherwise it should run benign course.     All questions were answered and patient agrees to plan  Level III    Elsie Lincoln, [place] 11/10/2013, 13:21    Preceptor Dr. Olivia Mackie    Case reviewed, agree with findings and plan of care as per provider noted above.    Roswell Miners, MD

## 2013-11-11 ENCOUNTER — Encounter (INDEPENDENT_AMBULATORY_CARE_PROVIDER_SITE_OTHER): Payer: Self-pay | Admitting: Family Medicine

## 2013-11-11 NOTE — Progress Notes (Signed)
Courtesy follow-up call attempted.  No answer at this time.  Rolena Infante, RN  11/11/2013, 13:09

## 2013-11-14 ENCOUNTER — Ambulatory Visit
Admission: RE | Admit: 2013-11-14 | Discharge: 2013-11-14 | Disposition: A | Discharge status: Home / Self Care | Payer: PRIVATE HEALTH INSURANCE | Source: Ambulatory Visit | Attending: Family | Admitting: Family

## 2013-11-14 DIAGNOSIS — N200 Calculus of kidney: Secondary | ICD-10-CM | POA: Insufficient documentation

## 2013-11-14 DIAGNOSIS — Q628 Other congenital malformations of ureter: Secondary | ICD-10-CM | POA: Insufficient documentation

## 2013-11-17 ENCOUNTER — Other Ambulatory Visit (HOSPITAL_BASED_OUTPATIENT_CLINIC_OR_DEPARTMENT_OTHER): Payer: Self-pay | Admitting: Family Medicine

## 2013-11-17 ENCOUNTER — Ambulatory Visit (HOSPITAL_BASED_OUTPATIENT_CLINIC_OR_DEPARTMENT_OTHER)
Admission: RE | Admit: 2013-11-17 | Discharge: 2013-11-17 | Disposition: A | Discharge status: Home / Self Care | Payer: No Typology Code available for payment source | Source: Ambulatory Visit | Attending: Family Medicine | Admitting: Family Medicine

## 2013-11-17 DIAGNOSIS — R071 Chest pain on breathing: Secondary | ICD-10-CM | POA: Insufficient documentation

## 2013-11-17 LAB — BUN: BUN: 22 mg/dL (ref 6–22)

## 2013-11-17 LAB — CREATININE WITH EGFR
CREATININE: 0.84 mg/dL (ref 0.53–1.00)
ESTIMATED GLOMERULAR FILTRATION RATE: >60 mL/min (ref >60)

## 2013-11-17 MED ORDER — IOPAMIDOL 370 MG IODINE/ML (76 %) INTRAVENOUS SOLUTION
INTRAVENOUS | Status: AC
Start: 2013-11-17 — End: 2013-11-17
  Administered 2013-11-17: 75 mL via INTRAVENOUS
  Filled 2013-11-17: qty 100

## 2013-11-17 MED ORDER — IOPAMIDOL 370 MG IODINE/ML (76 %) INTRAVENOUS SOLUTION
100.00 mL | INTRAVENOUS | Status: AC
Start: 2013-11-17 — End: 2013-11-17

## 2013-11-18 ENCOUNTER — Ambulatory Visit
Admission: RE | Admit: 2013-11-18 | Discharge: 2013-11-18 | Disposition: A | Discharge status: Home / Self Care | Payer: No Typology Code available for payment source | Source: Ambulatory Visit | Attending: Family Medicine | Admitting: Family Medicine

## 2013-11-18 DIAGNOSIS — R071 Chest pain on breathing: Secondary | ICD-10-CM | POA: Insufficient documentation

## 2013-11-18 LAB — CBC
BASOPHIL #: 0 K/uL (ref 0.0–0.10)
BASOPHILS %: 0.5 % (ref 0–2.50)
EOSINOPHIL #: 0.2 K/uL (ref 0.00–0.50)
EOSINOPHIL %: 3.1 % (ref 0.0–5.2)
HCT: 38.3 % (ref 36.0–48.0)
HGB: 12.6 g/dL (ref 12.0–16.0)
LYMPHOCYTE #: 1.1 K/uL (ref 0.7–3.20)
LYMPHOCYTE %: 19.9 % (ref 15.0–43.0)
MCH: 31.8 pg (ref 28.3–34.3)
MCHC: 33 g/dL (ref 32.0–36.0)
MCV: 96.5 fL (ref 82.0–100.0)
MONOCYTE #: 0.6 10*3/uL (ref 0.20–0.90)
MONOCYTE %: 10.6 % (ref 4.8–12.0)
MPV: 9.3 fL (ref 7.4–10.45)
NRBC ABSOLUTE: 0 10*3/uL (ref 0–0.02)
NRBC: 0.1 /100{WBCs} (ref 0–0.3)
PLATELET COUNT: 192 10*3/uL (ref 150–400)
PMN #: 3.5 10*3/uL (ref 1.5–6.5)
PMN %: 65.9 % (ref 43.0–76.0)
RBC: 3.97 M/uL — ABNORMAL LOW (ref 4.0–5.6)
RDW: 14.3 % (ref 11.0–16.0)
WBC: 5.3 10*3/uL (ref 4.0–11.0)

## 2013-11-18 LAB — C-REACTIVE PROTEIN(CRP),INFLAMMATION: C-REACTIVE PROTEIN (CRP),INFLAMMATION: <5 mg/L (ref <10)

## 2013-11-18 LAB — COMPREHENSIVE METABOLIC PROFILE - BMC/JMC ONLY
ALBUMIN/GLOBULIN RATIO: 2
ALBUMIN: 4 g/dL (ref 3.5–5.0)
ALKALINE PHOSPHATASE: 44 IU/L (ref 38–126)
ALT (SGPT): 20 IU/L (ref 14–54)
ANION GAP: 5 mmol/L (ref 3–11)
AST (SGOT): 24 IU/L (ref 15–41)
BILIRUBIN, TOTAL: 0.6 mg/dL (ref 0.3–1.2)
BUN: 23 mg/dL — ABNORMAL HIGH (ref 6–20)
CALCIUM: 9 mg/dL (ref 8.8–10.2)
CARBON DIOXIDE: 27 mmol/L (ref 22–32)
CHLORIDE: 110 mmol/L (ref 101–111)
CREATININE: 0.68 mg/dL (ref 0.44–1.00)
ESTIMATED GLOMERULAR FILTRATION RATE: >60 mL/min (ref >60)
GLUCOSE: 84 mg/dL (ref 70–110)
POTASSIUM: 3.9 mmol/L (ref 3.4–5.1)
SODIUM: 142 mmol/L (ref 136–145)
TOTAL PROTEIN: 6 g/dL — ABNORMAL LOW (ref 6.4–8.3)

## 2013-12-09 ENCOUNTER — Other Ambulatory Visit (HOSPITAL_BASED_OUTPATIENT_CLINIC_OR_DEPARTMENT_OTHER): Payer: Self-pay | Admitting: Family Medicine

## 2013-12-09 DIAGNOSIS — R0602 Shortness of breath: Secondary | ICD-10-CM

## 2013-12-15 ENCOUNTER — Ambulatory Visit (INDEPENDENT_AMBULATORY_CARE_PROVIDER_SITE_OTHER): Payer: No Typology Code available for payment source | Admitting: Specialist

## 2013-12-15 ENCOUNTER — Encounter (INDEPENDENT_AMBULATORY_CARE_PROVIDER_SITE_OTHER): Payer: Self-pay | Admitting: Specialist

## 2013-12-15 VITALS — BP 128/78 | HR 71 | Ht 67.0 in | Wt 182.6 lb

## 2013-12-15 DIAGNOSIS — N200 Calculus of kidney: Secondary | ICD-10-CM

## 2013-12-15 DIAGNOSIS — G43719 Chronic migraine without aura, intractable, without status migrainosus: Secondary | ICD-10-CM

## 2013-12-15 MED ORDER — ZONISAMIDE 25 MG CAPSULE
ORAL_CAPSULE | ORAL | Status: DC
Start: 2013-12-15 — End: 2014-07-27

## 2013-12-15 NOTE — Progress Notes (Signed)
RE:  Terilynn Buresh          08/04/1950    Dear Candee Furbish, MD:    I had the pleasure of seeing [name] in follow-up at the [facility] on 12/15/2013.  Today she reports that her headaches have improved.  She is currently taking zonisamide for prevention and using zolmitriptan and tizanidine as needed for her headaches.  Unfortunately she has developed kidney stones.  Severe Headaches: 1 per week  Response to abortive medications: Good  Side effects to medications:wt loss, paresthesia    Results of previous testing: n/a    Current Outpatient Prescriptions   Medication Sig Dispense Refill    albuterol sulfate (PROVENTIL OR VENTOLIN) 90 mcg/actuation Inhalation HFA Aerosol Inhaler Take 1-2 Puffs by inhalation Every 6 hours as needed        DICLOFENAC SODIUM ORAL Take by mouth        HYDROcodone-acetaminophen (NORCO) 7.5-325 mg Oral Tablet Take 1 Tab by mouth Every 6 hours as needed for Pain Earliest Fill Date: 06/18/13  20 Tab  0    Magnesium Oxide 500 mg Oral Tablet Take 1,000 mg by mouth Once a day        multivitamin Oral Tablet Take 1 Tab by mouth Once a day        tiZANidine (ZANAFLEX) 4 mg Oral Tablet Take 0.5 - 2tabs PO q8h prn headache  30 Tab  5    Zolmitriptan (ZOMIG) 5 mg Oral Tablet Take at onset of headache, may repeat in 2 hours if needed.  Max 2/d, max 3d/wk  9 Tab  11    zonisamide (ZONEGRAN) 100 mg Oral Capsule 1 qhs x 14d, then 2 qhs  60 Cap  11    zonisamide (ZONEGRAN) 25 mg Oral Capsule 2 qhs x 14 days  28 Cap  0     No current facility-administered medications for this visit.        Past medical history, family history, and social history have been reviewed and confirmed.    Sleep:poor sleep and daytime fatigue  Mood:Euthymic    Filed Vitals:    12/15/13 1518   BP: 128/78   Pulse: 71   Height: 1.702 m (5\' 7" )   Weight: 82.827 kg (182 lb 9.6 oz)     Her exam reveals she is normocephalic and atraumatic.  Mental status exam shows her to be oriented with good  memory, attention, knowledge, and language.  Cranial nerve exam reveals equal and reactive pupils with intact conjugate eye movements, symmetric face, and clear speech, and normal hearing to voice.  Gait is steady and coordination is intact to finger-to-nose testing.  All four extremeties have normal strength.      ICD-9-CM    1. Chronic migraine without aura, with intractable migraine, so stated, without mention of status migrainosus 346.71 zonisamide (ZONEGRAN) 25 mg Oral Capsule   2. Renal stone 592.0 zonisamide (ZONEGRAN) 25 mg Oral Capsule      She will taper off of zonegran due to kidney stones.  We will try to keep her off of anything, but she may need other prevention, consider naturals.  The patient will follow up with me in 6 months or sooner if needed.    [name], MD 12/15/2013, 16:10    Zella Richer. Shon Baton MD  Director, [facility]  Associate Professor of Neurology  [facility]

## 2013-12-15 NOTE — Patient Instructions (Signed)
Riboflavin (vitamin B2) 200 mg twice per day    CoEnzyme Q10 300 mg daily    Butterbur (Petadolex) 75 mg twice per day

## 2013-12-18 ENCOUNTER — Ambulatory Visit (HOSPITAL_BASED_OUTPATIENT_CLINIC_OR_DEPARTMENT_OTHER)
Admission: RE | Admit: 2013-12-18 | Discharge: 2013-12-18 | Disposition: A | Discharge status: Home / Self Care | Payer: No Typology Code available for payment source | Source: Ambulatory Visit | Attending: Family Medicine | Admitting: Family Medicine

## 2013-12-18 DIAGNOSIS — R942 Abnormal results of pulmonary function studies: Secondary | ICD-10-CM | POA: Insufficient documentation

## 2013-12-18 DIAGNOSIS — R0989 Other specified symptoms and signs involving the circulatory and respiratory systems: Secondary | ICD-10-CM

## 2013-12-18 DIAGNOSIS — R0602 Shortness of breath: Secondary | ICD-10-CM

## 2013-12-18 DIAGNOSIS — R071 Chest pain on breathing: Secondary | ICD-10-CM | POA: Insufficient documentation

## 2013-12-18 DIAGNOSIS — R0609 Other forms of dyspnea: Secondary | ICD-10-CM

## 2013-12-18 MED ORDER — ALBUTEROL SULFATE CONCENTRATE 2.5 MG/0.5 ML SOLUTION FOR NEBULIZATION
2.5000 mg | INHALATION_SOLUTION | Freq: Once | RESPIRATORY_TRACT | Status: AC
Start: 2013-12-18 — End: 2013-12-18
  Administered 2013-12-18: 2.5 mg via RESPIRATORY_TRACT

## 2013-12-23 ENCOUNTER — Encounter (INDEPENDENT_AMBULATORY_CARE_PROVIDER_SITE_OTHER): Payer: Self-pay | Admitting: Specialist

## 2013-12-24 NOTE — Telephone Encounter (Signed)
-----   Message from [facility] sent at 12/23/2013  9:17 PM EDT -----  Regarding: Visit Follow-Up Question  Contact: [phone]  As per your instructions, I have reduced my Zonisamide daily dose from 200 mg to 100 mg per day.  As you may recall, I am being taken off of this medication because I have developed kidney stones.  I have been on the 100 mg dosage for a little over a week now. But, starting 5 days ago, I have suffered from massive migraines each day----all of increased severity and duration. You indicated that I could contact you if that occurred and that is what I am doing now.      How should I proceed. I'm desperate.    [name]  [phone]  [email or web address]

## 2013-12-25 ENCOUNTER — Other Ambulatory Visit: Payer: Self-pay | Admitting: Family

## 2013-12-25 DIAGNOSIS — N23 Unspecified renal colic: Secondary | ICD-10-CM

## 2013-12-26 NOTE — Telephone Encounter (Signed)
Regarding: Visit Follow-Up Question  ----- Message from Abran Duke, RN sent at 12/24/2013  8:03 AM EDT -----       ----- Message from [name] to [name], MD sent at 12/23/2013  9:17 PM -----   As per your instructions, I have reduced my Zonisamide daily dose from 200 mg to 100 mg per day.  As you may recall, I am being taken off of this medication because I have developed kidney stones.  I have been on the 100 mg dosage for a little over a week now. But, starting 5 days ago, I have suffered from massive migraines each day----all of increased severity and duration. You indicated that I could contact you if that occurred and that is what I am doing now.      How should I proceed. I'm desperate.    [name]  [phone]  [email or web address]

## 2013-12-30 ENCOUNTER — Ambulatory Visit
Admission: RE | Admit: 2013-12-30 | Discharge: 2013-12-30 | Disposition: A | Discharge status: Home / Self Care | Payer: PRIVATE HEALTH INSURANCE | Source: Ambulatory Visit | Attending: Family | Admitting: Family

## 2013-12-30 DIAGNOSIS — N200 Calculus of kidney: Secondary | ICD-10-CM | POA: Insufficient documentation

## 2013-12-30 DIAGNOSIS — N23 Unspecified renal colic: Secondary | ICD-10-CM

## 2013-12-30 DIAGNOSIS — Z90710 Acquired absence of both cervix and uterus: Secondary | ICD-10-CM | POA: Insufficient documentation

## 2013-12-30 DIAGNOSIS — Z9889 Other specified postprocedural states: Secondary | ICD-10-CM | POA: Insufficient documentation

## 2014-03-09 ENCOUNTER — Other Ambulatory Visit: Payer: Self-pay

## 2014-03-30 ENCOUNTER — Other Ambulatory Visit (HOSPITAL_BASED_OUTPATIENT_CLINIC_OR_DEPARTMENT_OTHER): Payer: Self-pay | Admitting: Orthopaedic Surgery

## 2014-03-30 DIAGNOSIS — M461 Sacroiliitis, not elsewhere classified: Secondary | ICD-10-CM

## 2014-04-07 ENCOUNTER — Ambulatory Visit (HOSPITAL_BASED_OUTPATIENT_CLINIC_OR_DEPARTMENT_OTHER)
Admission: RE | Admit: 2014-04-07 | Discharge: 2014-04-07 | Disposition: A | Discharge status: Still In Hospital | Payer: No Typology Code available for payment source | Source: Ambulatory Visit

## 2014-04-07 DIAGNOSIS — M461 Sacroiliitis, not elsewhere classified: Secondary | ICD-10-CM | POA: Insufficient documentation

## 2014-04-07 NOTE — PT Evaluation (Signed)
[facility]  [address]   [place], [place]  [address]  ([phone]   [phone]    Physical Therapy Initial Evaluation    Patient Name:  [name]  DOB:  06-Oct-1950    Start of Care:   04/07/14  Diagnosis:   Right sacroiliitis  Onset of Injury:   Oct 2015    History of Injury:     She reports a history of sciatica for the past 20 years that comes and goes.   Last year she fell and broke her right elbow and at the same time hurt her knees and back.  She has been trying to walk on her t-mill, but her right knee gives away.   MD has her scheduled for a MRI of her back 04/17/14.    She c/o pain at right side of back/buttock to her knee.  Left side is also tender.   Sitting down and resting decreases her symptoms; however, continuous standing and walking increase her complaints.   F/U with MD 04/27/14.    PMH:     Arthritis, HA's/migraines, congential issues with bladder,   Social:     Married.    Retired.  Imaging Studies Performed:     None.   MRI scheduled.  Medications:     Mobic, Migraine med, low grade antibiotic, Vitamins  ROM:     Trunk/L-spine:   Flexion=minimally limited,  Extension=moderately limited,  B rot=minimally limited,   B SB=minimally to moderately limited.  B hip ROM grossly WNL's with slight tightness bilaterally with ER.  Strength:     B LE's grossly 4 to 4+/5 except B hip flexion=4-/5  Pain:  0/10(best)      8/10(worst)--after walking on t-mill mainly at knee more than right SI joint.   Better:   Heat, sit, meds  Worse:   Continuous standing/walking, bending, lifting    Girth:     Right knee swelling at lateral knee.  Sensation:     Occasional tingling at B feet at night.  Gait:     She is ambulating without AD on all surfaces for community distances.    Posture:     FH, bilateral rounded shoulders, flexed posture.  In standing, right iliac crest and PSIS slightly higher than left in standing.   Palpation:        Tender to palpation at B SI joint (right > left).  Muscular tightness noted at B lumbar paraspinals (right > left)  Other:     Tight B piriformis, HS and GSC musculature.  Decreased sacral P/A mobility with 50% restriction noted.    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  October 2015 symptoms flared with increased right SI pain with pain down RLE to knee with occasional incidences of right knee giving out.  Patient would benefit from further PT to address the following problems:   Restricted ROM/flexibility   Decreased strength   Decreased functional activities   Loss of endurance   Pain limiting function   Swelling at right knee   Decreased sacral mobility with tight musculature as noted above   Radicular complaints    STGs:  (2 weeks):     1.  Patient will be independent with HEP for ROM/flexibility and strengthening for increased ease of ADLs.     LTGs:  (6 weeks):  Pt will:  1.  Increase trunk/L-spine ROM  to minimal to no limitations in all planes for increased ease of ADL's and overall functional mobility.  2.  Improved sacral P/A mobility to 0-25% limitation for increased ease of ADL's and overall functional mobility.  3.  Decrease c/o pain at worst to 4/10 for increased ease of ADL's and overall functional mobility.   4.  Increase B hip flexor strength to 4 to 4+/5 for increased ease of ambulation/functional mobility.     Treatment may include:   Flexibility/ROM and strengthening exercises, HEP   Therapeutic exercise   Joint mobilization   Manual therapy   Manual traction   Mechanical traction   Electrical stimulation   Ultrasound   Moist Heat    Ice    Patient will be seen 2x/week x6 weeks to work on goals stated above.  The patient has been advised of his/her PT diagnosis, goals and POC.   Yes  The patient/family has given verbal consent for evaluation and treatment.   Yes    Total Evaluation Time:   20  Total Timed Treatment Minutes:   20  Total Treatment Time:  75 (eval, therex, IFC ESU)      Lucille Passy, MPT          ____________________________________________  Physician Signature    Date

## 2014-04-07 NOTE — PT Treatment (Signed)
[facility]  [address].   [place],  [place]  [address]  ([phone]   [phone]    Physical Therapy Treatment Note      Patient Name:  [name]  DOB:  [birth year]    Visit:   1/12  Date of Surgery:     Next appointment with physician:       Subjective:     "I've still been trying to do the t-mill once/day even though my right knee is so painful and it's giving out on me."  She was instructed to hold t-mill at this time due to increased c/o pain rating 8/10.  ( At least until after MRI results are noted.)  Objective:     Therex/HEP as follows:    x20 min,   x5-10 reps  Hooklying lumbar rotation  B SKTC  B piriformis stretch toward opposite shoulder  B supine HS stretch  B piriformis stretch crossing knee over opposite side then pull toward chest with a towel  Prone heel press--add  Prone sacral mobs (P/A)--add  **Patient given written copy of all ex's with pictures    MH + IFC ESU to L-spine with electrodes at B SI region x15 min in hooklying:  -80-50 Hz,  40% scan,  10.0 mA  -8 folded towel layers for MH  -Patient given call bell  -Skin inspection prior to and following treatment session:   Normal  Assessment:     She demonstrated all ex's appropriately with c/o stretching at B SI region.   She tolerated modalities without complaint and reported a slight decrease in tenderness at B SI region following treatment session.   She would benefit from further PT to address ROM/flexibility and strength deficits along with pain/radicular complaints and decreased sacral mobility for increased ease of ADL's and overall functional mobility.   Plan:     Con't with POC 2x/week x6 weeks to address above noted deficits for increased ease of ADL's and overall functional mobility.       Total Timed Treatment Minutes:    20  Total Treatment Time:    75 (eval, therex, MH, IFC ESU)

## 2014-04-09 ENCOUNTER — Ambulatory Visit (HOSPITAL_BASED_OUTPATIENT_CLINIC_OR_DEPARTMENT_OTHER)
Admission: RE | Admit: 2014-04-09 | Discharge: 2014-04-09 | Disposition: A | Discharge status: Still In Hospital | Payer: No Typology Code available for payment source | Source: Ambulatory Visit

## 2014-04-09 NOTE — PT Treatment (Signed)
[facility]  [address]   [place],  [place]  [address]  ([phone]   [phone]    Physical Therapy Treatment Note      Patient Name:  [name]  DOB:  05-May-1951    Visit:   2/12  Date of Surgery:     Next appointment with physician:       Subjective:     "I am feeling much better.   I've been doing the exercises 10 reps at home.  I'm feeling ok."   Patient reports that she will be riding in the car a good bit over the weekend.   She was instructed in a seated piriformis stretch to perform in the car and instructed to make frequent stops so she can stretch.  Objective:     Therex/HEP as follows:    x20 min,   x10 reps  Hooklying lumbar rotation  B SKTC  B piriformis stretch toward opposite shoulder  B supine HS stretch  B piriformis stretch crossing knee over opposite side then pull toward chest with a towel  Prone heel press  Prone sacral mobs (P/A)--add   **Patient given written copy of new exercise with pictures    MH + IFC ESU to L-spine with electrodes at B SI region x15 min in hooklying:  -80-50 Hz,  40% scan,  9.0 mA  -8 folded towel layers for MH  -Patient given call bell  -Skin inspection prior to and following treatment session:   Normal  Assessment:     She demonstrated all ex's appropriately with no c/o pian and only stretching at B SI region with piriformis stretch with LE crossed over opposite.  One new exercise added to POC in prone--heel press.   She performed this with minimal c/o stretching at B SI region.   She tolerated modalities without complaint and reported no c/o pain at B SI region following treatment session; however, slight tenderness noted along B lumbar paraspinals.  Good progress overall.   She would benefit from further PT to address ROM/flexibility and strength deficits along with pain/radicular complaints and decreased sacral mobility for increased ease of ADL's and overall functional mobility.      Plan:     Con't with POC 2x/week x6 weeks to address above noted deficits for increased ease of ADL's and overall functional mobility.       Total Timed Treatment Minutes:    20  Total Treatment Time:    35 (therex, MH, IFC ESU)

## 2014-04-20 ENCOUNTER — Ambulatory Visit (HOSPITAL_BASED_OUTPATIENT_CLINIC_OR_DEPARTMENT_OTHER)
Admission: RE | Admit: 2014-04-20 | Discharge: 2014-04-20 | Disposition: A | Discharge status: Still In Hospital | Payer: No Typology Code available for payment source | Source: Ambulatory Visit

## 2014-04-20 NOTE — PT Treatment (Addendum)
[facility]  [address]   [place],  [place]  [address]  ([phone]   [phone]    Physical Therapy Treatment Note      Patient Name:  [name]  DOB:  1950-08-20    Visit:   3/12  Date of Surgery:     Next appointment with physician:       Subjective:     "Today is the first day that I've felt something in my low back in a while.   We had the drive to [place] over the weekend and I did some of the ex's.   I left them in [place] so I'll need another copy.   I've been really busy today with sweeping/scrubbing floors and lifting C-mas boxes.   I've flared up my back and shoulders."  Objective:     Therex/HEP as follows:    x24 min,   x10 reps  Hooklying lumbar rotation  B SKTC  B piriformis stretch toward opposite shoulder  B supine HS stretch  B piriformis stretch crossing knee over opposite side then pull toward chest with a towel  Posterior pelvic tilt  Prone heel press--hold  Prone sacral mobs (P/A)--add   **Patient given written copy of all exercises with pictures    MH + IFC ESU to L-spine with electrodes at B SI region x15 min in hooklying:  -80-50 Hz,  40% scan,  8.9 mA  -8 folded towel layers for MH  -Patient given call bell  -Skin inspection prior to and following treatment session:   Normal  Assessment:     She demonstrated all ex's appropriately with slight c/o LBP with piriformis stretch today; however, she was able to complete all reps.   She requested not to perform prone heel press today due to LBP and fear that this would increase her symptoms.  Supine posterior pelvic tilt added to POC today without complaint.    She tolerated modalities without complaint and reported PSR=1/10 at B SI region following treatment session.  Will try to add further strengthening/stabilization ex's next visit if patient is feeling better.   She would benefit from further PT to address ROM/flexibility and strength deficits along with pain/radicular  complaints and decreased sacral mobility for increased ease of ADL's and overall functional mobility.   Plan:     Con't with POC 2x/week x6 weeks to address above noted deficits for increased ease of ADL's and overall functional mobility.       Total Timed Treatment Minutes:    24  Total Treatment Time:    40 (therex, MH, IFC ESU)

## 2014-04-22 ENCOUNTER — Ambulatory Visit (HOSPITAL_BASED_OUTPATIENT_CLINIC_OR_DEPARTMENT_OTHER)
Admission: RE | Admit: 2014-04-22 | Discharge: 2014-04-22 | Disposition: A | Discharge status: Still In Hospital | Payer: No Typology Code available for payment source | Source: Ambulatory Visit

## 2014-04-22 NOTE — PT Treatment (Signed)
[facility]  [address]   [place],  [place]  [address]  ([phone]   [phone]    Physical Therapy Treatment Note      Patient Name:  [name]  DOB:  [birth year]    Visit:   4/12  Date of Surgery:     Next appointment with physician:       Subjective:     "My left knee has really been bothering me with all of the ex's.   I can't think of anything that I've done."   She reported that she con't to walk on treadmill 3x/day even if there is pain.   She was encouraged to listen to her body an if there is pain she is to decrease time/speed on t-mill.   She is already walking at 0% elevation.   Objective:     Therex/HEP as follows:    x39 min,   x10 reps  Standing left ITB stretch in doorframe  Standing B GSC stretch  Hooklying lumbar rotation  B SKTC  B piriformis stretch toward opposite shoulder  B long sit HS stretch  B piriformis stretch crossing knee over opposite side then pull toward chest with a towel  Posterior pelvic tilt  Pelvic tilt with march  Prone heel press  Prone sacral mobs (P/A):   10 x20 oscillations,   Grade ii-iii  **Patient given written copy of new exercises with pictures    MH + IFC ESU to L-spine with electrodes at B SI region x15 min in hooklying:  -80-50 Hz,  40% scan,  13.6 mA  -8 folded towel layers for MH  -Patient given call bell  -Skin inspection prior to and following treatment session:   Normal  Assessment:     Patient arrived with c/o left knee pain greater than SI pain.   Tightness noted at left ITB; therefore, stretch added to POC for this tightness.  She demonstrated all ex's appropriately with slight c/o LBP along with lateral left knee pain.   Supine HS stretch switched to long-sit due to knee complaints.    She was able to perform heel press today without complaints and P/A sacral mobs added to POC.  Initially poor mobility noted at sacrum; however, with progression of manual techniques mobility  improved; however, con't to be limited 50%.    Supine posterior pelvic tilt with march added to POC today without complaint.    She tolerated modalities without complaint and reported PSR=1/10 at B SI region following treatment session.  Will con't to add further strengthening/stabilization ex's next visit if patient is feeling better.   She would benefit from further PT to address ROM/flexibility and strength deficits along with pain/radicular complaints and decreased sacral mobility for increased ease of ADL's and overall functional mobility.   Plan:     Con't with POC 2x/week x6 weeks to address above noted deficits for increased ease of ADL's and overall functional mobility.       Total Timed Treatment Minutes:    39  Total Treatment Time:    54 (therex, MH, IFC ESU, manual)

## 2014-04-27 ENCOUNTER — Ambulatory Visit (HOSPITAL_BASED_OUTPATIENT_CLINIC_OR_DEPARTMENT_OTHER)
Admission: RE | Admit: 2014-04-27 | Discharge: 2014-04-27 | Disposition: A | Discharge status: Still In Hospital | Payer: No Typology Code available for payment source | Source: Ambulatory Visit

## 2014-04-27 NOTE — PT Treatment (Signed)
[facility]  [address]   [place],  [place]  [address]  ([phone]   [phone]    Physical Therapy Treatment Note      Patient Name:  [name]  DOB:  [birth year]    Visit:   5/12  Date of Surgery:     Next appointment with physician:       Subjective:     Patient arrived with c/o left knee and shoulder pain greater than back pain.   She reported increased crepitus and pain at left knee over the past several days.  She was instructed to contact MD regarding these new complaints.   Patient was painting last week and this may have aggravated her shoulder complaints and she was ambulating on t-mill 3x/day since last Feb.   She was recently instructed to decrease this to 1x/day or even less depending upon symptoms.   She brought in a TENS unit to be set up by therapist.   Objective:     Therex/HEP as follows:    x40 min,   x10 reps  Standing left ITB stretch in doorframe  Standing B GSC stretch  Hooklying lumbar rotation  B SKTC  B piriformis stretch toward opposite shoulder  B long sit HS stretch  B piriformis stretch crossing knee over opposite side then pull toward chest with a towel  Posterior pelvic tilt  Pelvic tilt with march  Prone heel press--HOLD  Prone sacral mobs (P/A):   10 x20 oscillations,   Grade ii-iii---HOLD  Tens unit instruction with appropriate set-up and use.   She was instructed in don/doff along with precautions.   She demonstrated appropriate understanding of TENS unit use.     MH + IFC ESU to L-spine with electrodes at B SI region x15 min in hooklying:  -80-50 Hz,  40% scan,  10.3 mA  -8 folded towel layers for MH  -Patient given call bell  -Skin inspection prior to and following treatment session:   Normal  Assessment:     Patient arrived with c/o left knee and shoulder pain greater than SI pain; however, was having some LBP.   She demonstrated all ex's appropriately with slight c/o LBP along with lateral  left knee pain.  PT assisted with several ex's at LLE lifting LE to assist with left knee and shoulder pain.    Long-sit HS stretch performed in place of supine stretches once again due to knee complaints.    Prone ex's held per patient request due to left knee complaints.   Supine posterior pelvic tilt along with pelvic tilt + march performed without complaint.    She tolerated modalities without complaint and reported PSR=0/10 at B SI region following treatment session.  She demonstrated appropriate set up and use of TENS unit as well.   Will con't to add further strengthening/stabilization ex's next visit if patient is feeling better.   She would benefit from further PT to address ROM/flexibility and strength deficits along with pain/radicular complaints and decreased sacral mobility for increased ease of ADL's and overall functional mobility.   Plan:     Con't with POC 2x/week x6 weeks to address above noted deficits for increased ease of ADL's and overall functional mobility.       Total Timed Treatment Minutes:    40  Total Treatment Time:    55 (therex, MH, IFC ESU, manual)

## 2014-04-28 ENCOUNTER — Ambulatory Visit (HOSPITAL_BASED_OUTPATIENT_CLINIC_OR_DEPARTMENT_OTHER)
Admission: RE | Admit: 2014-04-28 | Discharge: 2014-04-28 | Disposition: A | Discharge status: Still In Hospital | Payer: No Typology Code available for payment source | Source: Ambulatory Visit | Attending: Orthopaedic Surgery | Admitting: Orthopaedic Surgery

## 2014-04-28 NOTE — PT Treatment (Signed)
[facility]  [address]   [place],  [place]  [address]  ([phone]   [phone]    Physical Therapy Treatment Note      Patient Name:  [name]  DOB:  1950/10/29    Visit:   6/12  Date of Surgery:     Next appointment with physician:       Subjective:     "I'm feeling much better compared to yesterday.   I didn't do the t-mill last night and I took a Mobic.  My shoulder, knee and back are much better."  Objective:     Therex/HEP as follows:    x41 min,   x10 reps  Standing left ITB stretch in doorframe  Standing B GSC stretch  Hooklying lumbar rotation  B SKTC  B piriformis stretch toward opposite shoulder  B long sit HS stretch  B piriformis stretch crossing knee over opposite side then pull toward chest with a towel  Posterior pelvic tilt  Pelvic tilt with march  Prone heel press  Prone sacral mobs (P/A):   10 x20 oscillations,   Grade ii-iii    MH + IFC ESU to L-spine with electrodes at B SI region x15 min in hooklying:  -80-50 Hz,  40% scan,  6.8 mA  -8 folded towel layers for MH  -Patient given call bell  -Skin inspection prior to and following treatment session:   Normal  Assessment:     Patient arrived to appt feeling much better today with less left shoulder and knee pain and minimal LBP.   She demonstrated all ex's appropriately with minimal to no c/o pain and no assistance from therapist.   Prone ex's were performed today along with sacral mobs.  Minimal c/o LBP with heel press.  P/A sacral mobs con't to be limited and painful on left.  Bridging added to POC without complaint.   She tolerated modalities well and reported PSR=0-1/10 at B SI region following treatment session.   Patient will be OOT over the next week so she will con't with HEP and use of TENS as needed.   She would benefit from further PT to address ROM/flexibility and strength deficits along with pain/radicular complaints and decreased sacral mobility for  increased ease of ADL's and overall functional mobility.   Plan:     Con't with POC 2x/week x6 weeks to address above noted deficits for increased ease of ADL's and overall functional mobility.       Total Timed Treatment Minutes:    41  Total Treatment Time:    26 (therex, MH, IFC ESU, manual)

## 2014-05-08 ENCOUNTER — Ambulatory Visit (HOSPITAL_BASED_OUTPATIENT_CLINIC_OR_DEPARTMENT_OTHER): Payer: No Typology Code available for payment source

## 2014-05-13 ENCOUNTER — Ambulatory Visit (HOSPITAL_BASED_OUTPATIENT_CLINIC_OR_DEPARTMENT_OTHER)
Admission: RE | Admit: 2014-05-13 | Discharge: 2014-05-13 | Disposition: A | Discharge status: Still In Hospital | Payer: No Typology Code available for payment source | Source: Ambulatory Visit

## 2014-05-13 DIAGNOSIS — M461 Sacroiliitis, not elsewhere classified: Secondary | ICD-10-CM | POA: Insufficient documentation

## 2014-05-13 NOTE — PT Treatment (Signed)
[facility]  [address]   [place],  [place]  [address]  ([phone]   [phone]    Physical Therapy Treatment Note      Patient Name:  [name]  DOB:  [birth year]    Visit:   7/12  Date of Surgery:     Next appointment with physician:   05/25/14    Subjective:     "My left shoulder is really hurting.   It's bothering me the most.   The left knee and low back have been hurting too.   I've been taking 2 Mobic and the shoulder still doesn't feel much better."   Patient reports that she was able to get a visit with Dr Hervey Ard on 05/25/14.   "I had to take my clothes to the laundry mat and I'm sure that's what aggravated my shoulder."  Patient instructed to try ice on shoulder also.  Objective:     Therex/HEP as follows:    x32 min,   x10 reps  **PT assisted with therex that required use of LUE to pull knee toward chest, etc**   Patient is demonstrating signs and symptoms of biceps tendonitis at left shoulder.   Standing left ITB stretch in doorframe  Standing B GSC stretch  Hooklying lumbar rotation  B SKTC  B piriformis stretch toward opposite shoulder  B long sit HS stretch  B piriformis stretch crossing knee over opposite side then pull toward chest with a towel  Posterior pelvic tilt  Pelvic tilt with march  Bridging  Prone heel press--HOLD secondary to pain    Manual:     Prone sacral mobs (P/A):   10 x20 oscillations,   Grade ii-iii  (x10 min)    MH + IFC ESU to L-spine with electrodes at B SI region x15 min in hooklying with blue bolster:  -80-50 Hz,  40% scan,  12.0 mA  -8 folded towel layers for MH  -Patient given call bell  -Skin inspection prior to and following treatment session:   Normal  Assessment:     Patient arrived to appt with increased c/o pain at left shoulder, knee and low back.   She has been driving a good bit and she had to take her clothes to the laundry mat due to her washing machine breaking and feels this  flared up her shoulder.   She demonstrated all ex's appropriately with PT assisting all ex's that required use of LUE.  She is very tender to palpation over biceps tendon.   Prone heel press held, but sacral mobs were performed which were very stiff with decreased mobility noted.  Sacral mobs performed following modalities also and slightly more mobility was noted.   She tolerated modalities well and reported slightly less discomfort at SI region.   Patient will be OOT again and will con't with HEP and use of TENS as needed.   She would benefit from further PT to address ROM/flexibility and strength deficits along with pain/radicular complaints and decreased sacral mobility for increased ease of ADL's and overall functional mobility.   Plan:     Con't with POC 2x/week x6 weeks to address above noted deficits for increased ease of ADL's and overall functional mobility.       Total Timed Treatment Minutes:    42   Total Treatment Time:    6 (therex, MH, IFC ESU, manual)

## 2014-05-18 ENCOUNTER — Ambulatory Visit (HOSPITAL_BASED_OUTPATIENT_CLINIC_OR_DEPARTMENT_OTHER)
Admission: RE | Admit: 2014-05-18 | Discharge: 2014-05-18 | Disposition: A | Discharge status: Still In Hospital | Payer: No Typology Code available for payment source | Source: Ambulatory Visit

## 2014-05-18 NOTE — PT Treatment (Signed)
[facility]  [address]   [place],  [place]  [address]  ([phone]   [phone]    Physical Therapy Treatment Note      Patient Name:  [name]  DOB:  1950-08-02    Visit:   8/12  Date of Surgery:     Next appointment with physician:   05/25/14    Subjective:     "My hands have really been hurting in the mornings.  I still have the back, shoulder and knee pain.  I'm going for a MRI on my back Wednesday."  She points to the LBP at B SI region and c/o radiating symptoms that extend laterally.   Objective:     Therex/HEP as follows:    x5 min,   x10 reps--reviewed HEP with patient.   She reports that she perform ex's earlier today.   Standing left ITB stretch in doorframe  Standing B GSC stretch  Hooklying lumbar rotation  B SKTC  B piriformis stretch toward opposite shoulder  B long sit HS stretch  B piriformis stretch crossing knee over opposite side then pull toward chest with a towel  Posterior pelvic tilt  Pelvic tilt with march  Bridging  Prone heel press--HOLD secondary to pain    Manual:     Prone sacral mobs (P/A):   10 x20 oscillations,   Grade ii-iii  (x10 min)    MH + IFC ESU to L-spine with electrodes at B SI region x15 min in 90-90 with lumbar traction:  -Intermittent lumbar traction:   60# max/45# min,   45 sec on/15 sec off  -80-50 Hz,  40% scan,  13.2 mA  -8 folded towel layers for MH  -Patient given call bell  -Skin inspection prior to and following treatment session:   Normal  Assessment:     Patient arrived to appt with con't c/o pain at left shoulder, knee, low back and hands.   She is demonstrating signs of symptoms of arthritis in multiple body regions.  Discussed with patient about obtaining a referral to a rheumatologist.   She has been driving a good bit and has to drive several hours again this weekend.   She feels this may be flaring up her symptoms.   She con't to be independent with HEP.   Discussed  importance of con't compliance.   Sacral mobs con't to be limited and painful with radiating symptoms laterally.   She tolerated modalities well including addition of traction; however, she con't to have discomfort at B SI region following treatment session.    She will con't with HEP and use of TENS at home as needed.    She would benefit from further PT to address ROM/flexibility and strength deficits along with pain/radicular complaints and decreased sacral mobility for increased ease of ADL's and overall functional mobility.   Plan:     Con't with POC 2x/week x6 weeks to address above noted deficits for increased ease of ADL's and overall functional mobility.       Total Timed Treatment Minutes:    15 (therex, manual)  Total Treatment Time:    35 (therex, MH, IFC ESU, manual, lumbar traction)

## 2014-05-19 ENCOUNTER — Ambulatory Visit (HOSPITAL_BASED_OUTPATIENT_CLINIC_OR_DEPARTMENT_OTHER)
Admission: RE | Admit: 2014-05-19 | Discharge: 2014-05-19 | Disposition: A | Discharge status: Still In Hospital | Payer: No Typology Code available for payment source | Source: Ambulatory Visit

## 2014-05-19 NOTE — PT Treatment (Signed)
[facility]  [address]   [place],  [place]  [address]  ([phone]   [phone]    Physical Therapy Treatment Note      Patient Name:  [name]  DOB:  07/05/1950    Visit:   9/12  Date of Surgery:     Next appointment with physician:   05/25/14    Subjective:     "I did my ex's and I got on the t-mill.  My knee, back and shoulder are still sore, but maybe a little better than yesterday.  I go for the MRI of my back tomorrow."  Objective:     Therex/HEP as follows:    x5 min,   x10 reps--reviewed HEP with patient.   She reports that she perform ex's earlier today once again.   Standing left ITB stretch in doorframe  Standing B GSC stretch  Hooklying lumbar rotation  B SKTC  B piriformis stretch toward opposite shoulder  B long sit HS stretch  B piriformis stretch crossing knee over opposite side then pull toward chest with a towel  Posterior pelvic tilt  Pelvic tilt with march  Bridging  Prone heel press--HOLD secondary to pain    Manual:     Prone sacral mobs (P/A):   10 x20 oscillations,   Grade ii-iii  (x9 min)    MH + IFC ESU to L-spine with electrodes at B SI region x15 min in 90-90 with lumbar traction:  -Intermittent lumbar traction:   60# max/45# min,   45 sec on/15 sec off  -80-50 Hz,  40% scan,  12.7 mA  -8 folded towel layers for MH  -Patient given call bell  -Skin inspection prior to and following treatment session:   Normal  Assessment:     Patient arrived to appt with slightly less c/o pain at left shoulder, knee, low back and hands.  She performed her ex's prior to arrival since she is in a 30 min session.  She had no issues with the ex's for HEP and con't to be independent.   She will con't with ex's as tolerated.   Discussed importance of con't compliance.   Sacral mobs con't to be limited and painful with radiating symptoms laterally; however, she was able to tolerate mobs at sacrum that were slightly lateral to  the right.  After several minutes, P/A sacral mobility improved--although she still had some discomfort on the left. Con't with intermittent lumbar traction today and she tolerated modalities well ; however, she con't to have discomfort at B SI region following treatment session.   She will f/u with MD 05/26/14 to obtain MRI results.   She will con't with HEP and use of TENS at home as needed.    She would benefit from further PT to address ROM/flexibility and strength deficits along with pain/radicular complaints and decreased sacral mobility for increased ease of ADL's and overall functional mobility.   Plan:     Con't with POC 2x/week x6 weeks to address above noted deficits for increased ease of ADL's and overall functional mobility.       Total Timed Treatment Minutes:    14 (therex, manual)  Total Treatment Time:    30 (therex, MH, IFC ESU, manual, lumbar traction)

## 2014-05-26 ENCOUNTER — Ambulatory Visit (HOSPITAL_BASED_OUTPATIENT_CLINIC_OR_DEPARTMENT_OTHER)
Admission: RE | Admit: 2014-05-26 | Discharge: 2014-05-26 | Disposition: A | Discharge status: Still In Hospital | Payer: No Typology Code available for payment source | Source: Ambulatory Visit | Attending: Orthopaedic Surgery | Admitting: Orthopaedic Surgery

## 2014-05-26 NOTE — PT Treatment (Signed)
[facility]  [address]   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   He referred me to pain management and told me that this is only going to get worse.  He wants me to try doing the ex's at home for a while and he said that if it flares up that he'll send me back to PT.   I've been moving more and trying to do my t-mill daily.   So far, it's been ok.  I know if it flares up to stop it for a while."   "He gave me an injection in my shoulder so it's feeling better too."  Objective:     RE measurements as follows:     Independent HEP  PSR:  0/10 (best),   4/10 (worst)  ROM:   Trunk/L-spine:   Grossly minimal limitations noted except extension=minimally to moderately limited.  P/A sacral mobs limited 25-50%  Strength:   BLE's grossly 4 to 4+/5 in all planes    Therex/HEP as follows:    x41 min,   x10 reps  (Therex and RE measurements/FOTO)  Standing left ITB stretch in doorframe  Standing B GSC stretch  Hooklying lumbar rotation  B SKTC  B piriformis stretch toward opposite shoulder  B long sit HS stretch  B piriformis stretch crossing knee over opposite side then pull toward chest with a towel  Posterior pelvic tilt  Pelvic tilt with march  Bridging--only 3 reps secondary to pain    Manual:     Prone sacral mobs (P/A):   10 x20 oscillations,   Grade ii-iii  (x5 min)    MH + IFC ESU to L-spine with electrodes at B SI region x15 min in 90-90:  -80-50 Hz,  40% scan,  13.0 mA  -8 folded towel layers for MH  -Patient given call bell  -Skin inspection prior to and following treatment session:   Normal  Assessment:     Patient arrived to appt  reporting slightly less pain at B SI joints.   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   She stated that MD would like her to con't with ex's at home and if she needs a referral to come back to therapy in the future he will give her one.  Sacral P/A mobility con't to be limited 25-50%.   Trunk/L-spine ROM remains about the same with most limitation noted with extension.   Pain complaints at worst have decreased 50% compared to IE.   Reviewed HEP with patient and she is independent with all ex's.   Discussed importance of compliance with HEP especially with current diagnosis.  She tolerated modalities well and reported 0/10 PSR following treatment session.   She has a TENS unit to use at home as needed.   D/C to HEP at this time.  Goals:   STG met.  LTG #1 met except  extension.   #3 and #4 met.   Plan:     D/C to HEP.       Total Timed Treatment Minutes:    39 (therex, manual)  Total Treatment Time:    61 (therex, MH, IFC ESU, manual, RE measurements)

## 2014-06-22 ENCOUNTER — Encounter (INDEPENDENT_AMBULATORY_CARE_PROVIDER_SITE_OTHER): Payer: No Typology Code available for payment source | Admitting: Specialist

## 2014-07-27 ENCOUNTER — Ambulatory Visit (INDEPENDENT_AMBULATORY_CARE_PROVIDER_SITE_OTHER): Payer: Medicare Other | Admitting: Specialist

## 2014-07-27 ENCOUNTER — Encounter (INDEPENDENT_AMBULATORY_CARE_PROVIDER_SITE_OTHER): Payer: Self-pay | Admitting: Specialist

## 2014-07-27 VITALS — BP 130/72 | HR 73 | Ht 67.5 in | Wt 182.0 lb

## 2014-07-27 DIAGNOSIS — Z6828 Body mass index (BMI) 28.0-28.9, adult: Secondary | ICD-10-CM

## 2014-07-27 DIAGNOSIS — G43009 Migraine without aura, not intractable, without status migrainosus: Secondary | ICD-10-CM

## 2014-07-27 MED ORDER — SUMATRIPTAN 85 MG-NAPROXEN 500 MG TABLET
ORAL_TABLET | ORAL | Status: DC
Start: 2014-07-27 — End: 2014-12-29

## 2014-07-27 MED ORDER — ZOLMITRIPTAN 5 MG TABLET
ORAL_TABLET | ORAL | Status: DC
Start: 2014-07-27 — End: 2015-08-01

## 2014-07-27 MED ORDER — ZOLMITRIPTAN 5 MG TABLET
ORAL_TABLET | ORAL | Status: DC
Start: 2014-07-27 — End: 2014-09-28

## 2014-07-27 NOTE — Progress Notes (Signed)
RE:  [name]          [birth year]    Dear Candee Furbish, MD:    I had the pleasure of seeing [name] in follow-up at the [facility] on 07/27/2014.  Today she reports that her headaches have worsened.  She is currently taking magnesium and coenzyme q10 for prevention and using zolmitriptan as needed for her headaches.  Severe Headaches: 25 per month  Headache Free Days: 5 per week  Headache Description: unilateral in the left frontal area, unilateral in the left temporal area.  Lots of allodynia.  Rated as 10/10.  Response to abortive medications: Fair  Side effects to medications: nose bleeds    Results of previous testing: n/a    Current Outpatient Prescriptions   Medication Sig Dispense Refill    cream base no.171, bulk, Does not apply Cream by Does not apply route Once a day Meloxicam/lido/ect.      lidocaine (XYLOCAINE) 5 % Ointment by Apply Topically route Once per day as needed      Meloxicam (MOBIC) 15 mg Oral Tablet Take 15 mg by mouth Once a day      nitrofurantoin (MACROBID) 100 mg Oral Capsule Take 100 mg by mouth Every night      Sumatriptan-Naproxen (TREXIMET) 85-500 mg Oral Tablet Take at onset of headache, may repeat in 2 hours.  Max 2/d, max 3d/wk 9 Tab 11    Zolmitriptan (ZOMIG) 5 mg Oral Tablet Take at onset of headache, may repeat in 2 hours if needed.  Max 2/d, max 3d/wk 18 Tab 11     No current facility-administered medications for this visit.        Past medical history, family history, and social history have been reviewed and confirmed.    Sleep:trouble falling asleep and trouble staying asleep  Mood:Mixed    Filed Vitals:    07/27/14 1418   BP: 130/72   Pulse: 73   Height: 1.715 m (5' 7.5")   Weight: 82.555 kg (182 lb)   SpO2: 98%     Her exam reveals she is normocephalic and atraumatic.  Mental status exam shows her to be oriented with good memory, attention, knowledge, and language.  Cranial nerve exam reveals equal and reactive pupils with intact  conjugate eye movements, symmetric face, and clear speech, and normal hearing to voice.  Gait is steady and coordination is intact to finger-to-nose testing.  All four extremeties have normal strength.      ICD-10-CM    1. Migraine without aura G43.009 Zolmitriptan (ZOMIG) 5 mg Oral Tablet     Sumatriptan-Naproxen (TREXIMET) 85-500 mg Oral Tablet      She has not done well with preventives.   Discussed botox but she is too nervous.  Does not want to try Lyrica.  Feeling fatalistic.      The patient will follow up with me in 6 months or sooner if needed.      [name], MD 07/27/2014, 14:39    Zella Richer. Shon Baton MD  Director, [facility]  Associate Professor of Neurology  [facility]

## 2014-08-11 ENCOUNTER — Encounter (HOSPITAL_COMMUNITY): Payer: Self-pay

## 2014-09-10 ENCOUNTER — Ambulatory Visit (INDEPENDENT_AMBULATORY_CARE_PROVIDER_SITE_OTHER): Payer: Medicare Other | Admitting: Family Medicine

## 2014-09-10 ENCOUNTER — Encounter (INDEPENDENT_AMBULATORY_CARE_PROVIDER_SITE_OTHER): Payer: Self-pay | Admitting: Family Medicine

## 2014-09-10 ENCOUNTER — Other Ambulatory Visit
Admission: RE | Admit: 2014-09-10 | Discharge: 2014-09-10 | Disposition: A | Discharge status: Home / Self Care | Payer: Medicare Other | Attending: Family Medicine | Admitting: Family Medicine

## 2014-09-10 VITALS — BP 126/76 | HR 75 | Temp 97.7°F | Resp 16 | Wt 178.0 lb

## 2014-09-10 DIAGNOSIS — Z6827 Body mass index (BMI) 27.0-27.9, adult: Secondary | ICD-10-CM

## 2014-09-10 DIAGNOSIS — R197 Diarrhea, unspecified: Principal | ICD-10-CM

## 2014-09-10 DIAGNOSIS — R109 Unspecified abdominal pain: Secondary | ICD-10-CM

## 2014-09-10 NOTE — Progress Notes (Signed)
[name]  Date of Service: 09/10/2014    Chief complaint:   Chief Complaint   Patient presents with    Abdominal Cramping    Gas     Subjective  Patient comes in with a month of what started with diarrhea and has moved into loose mucousy bowel movements. She is now crampy lower palpable pain. There's been no blood in the bowel movements. She's had no fever chills. No significant nausea or vomiting has been noted. 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 UA in office today showed trace leukocytes and blood. No history of abdominal surgery. She wasn't exposed to C. difficile a little over a year ago.    Current Outpatient Prescriptions   Medication Sig    cream base no.171, bulk, Does not apply Cream by Does not apply route Once a day Meloxicam/lido/ect.    lidocaine (XYLOCAINE) 5 % Ointment by Apply Topically route Once per day as needed    Meloxicam (MOBIC) 15 mg Oral Tablet Take 15 mg by mouth Once a day    nitrofurantoin (MACROBID) 100 mg Oral Capsule Take 100 mg by mouth Every night    Sumatriptan-Naproxen (TREXIMET) 85-500 mg Oral Tablet Take at onset of headache, may repeat in 2 hours.  Max 2/d, max 3d/wk    Zolmitriptan (ZOMIG) 5 mg Oral Tablet Take at onset of headache, may repeat in 2 hours if needed.  Max 2/d, max 3d/wk    Zolmitriptan (ZOMIG) 5 mg Oral Tablet Take at onset of headache, may repeat in 2 hours if needed.  Max 2/d, max 3d/wk     Allergies   Allergen Reactions    Percocet [Oxycodone-Acetaminophen] Nausea/ Vomiting         Objective  Vitals: BP 126/76 mmHg   Pulse 75   Temp(Src) 36.5 C (97.7 F) (Oral)   Resp 16   Wt 80.74 kg (178 lb)  General: no distress  Neck: no adenopathy  Lungs: clear to auscultation bilaterally.   Cardiovascular:    Heart regular rate and rhythm  Abdomen: bowel sounds normal and palpation: Tenderness: mild diffusely    Assessment/Plan  1. Diarrhea    2. Abdominal pain        Orders Placed This Encounter    STOOL  CULTURE - [facility] ONLY    FECAL WHITE BLOOD CELLS - [facility] ONLY    OVA & PARASITES W/TRICHROME - [facility] ONLY    URINE CULTURE - [facility] ONLY    US ABDOMEN COMPLETE    COMPREHENSIVE METABOLIC PROFILE - [facility] ONLY    CBC    CDIFF BY PCR - [facility] ONLY    POCT URINE DIPSTICK        Ordered ultrasound, stool studies and labs. If pain becomes more intense or worsen she is to go to emergency room. She is to follow up with PCP early next week.    Alvin Critchley, MD

## 2014-09-10 NOTE — Progress Notes (Signed)
BP 126/76 mmHg   Pulse 75   Temp(Src) 36.5 C (97.7 F) (Oral)   Resp 16   Wt 80.74 kg (178 lb)  Margaree Mackintosh, RN  09/10/2014, 17:18

## 2014-09-10 NOTE — Progress Notes (Signed)
09/10/14 1800   Urine   Time collected 1810   Glucose Negative   Bilirubin Negative   Ketones Negative   Specific Gravity 1.015   Blood (urine) Trace Intact   pH 7.0   Protein Negative   Urobilinogen Normal    Nitrite Negative   Leukocytes Trace   Initials KSJ

## 2014-09-11 ENCOUNTER — Ambulatory Visit
Admission: RE | Admit: 2014-09-11 | Discharge: 2014-09-11 | Disposition: A | Discharge status: Home / Self Care | Payer: Medicare Other | Source: Ambulatory Visit | Attending: Family Medicine | Admitting: Family Medicine

## 2014-09-11 DIAGNOSIS — R109 Unspecified abdominal pain: Secondary | ICD-10-CM | POA: Insufficient documentation

## 2014-09-11 DIAGNOSIS — R197 Diarrhea, unspecified: Secondary | ICD-10-CM | POA: Insufficient documentation

## 2014-09-11 LAB — CBC
BASOPHIL #: 0 10*3/uL (ref 0.0–0.10)
BASOPHILS %: 0.5 % (ref 0–2.50)
EOSINOPHIL #: 0.2 10*3/uL (ref 0.00–0.50)
EOSINOPHIL %: 3.4 % (ref 0.0–5.2)
HCT: 37.1 % (ref 36.0–48.0)
HGB: 11.9 g/dL — ABNORMAL LOW (ref 12.0–16.0)
LYMPHOCYTE #: 1.4 10*3/uL (ref 0.7–3.20)
LYMPHOCYTE %: 27.6 % (ref 15.0–43.0)
MCH: 30.3 pg (ref 28.3–34.3)
MCHC: 32 g/dL (ref 32.0–36.0)
MCV: 94.6 fL (ref 82.0–100.0)
MCV: 94.6 fL (ref 82.0–100.0)
MONOCYTE #: 0.4 10*3/uL (ref 0.20–0.90)
MONOCYTE %: 8.7 % (ref 4.8–12.0)
MPV: 9.2 fL (ref 7.4–10.45)
NRBC ABSOLUTE: 0 10*3/uL (ref 0–0.02)
NRBC: 0 /100{WBCs} (ref 0–0.3)
PLATELET COUNT: 179 10*3/uL (ref 150–400)
PMN #: 3 K/uL (ref 1.5–6.5)
PMN #: 3 K/uL (ref 1.5–6.5)
PMN %: 59.8 % (ref 43.0–76.0)
RBC: 3.92 M/uL — ABNORMAL LOW (ref 4.0–5.6)
RDW: 13 % (ref 11.0–16.0)
WBC: 5.1 K/uL (ref 4.0–11.0)

## 2014-09-11 LAB — COMPREHENSIVE METABOLIC PROFILE - BMC/JMC ONLY
ALBUMIN/GLOBULIN RATIO: 1.6
ALBUMIN: 4.3 g/dL (ref 3.5–5.0)
ALKALINE PHOSPHATASE: 44 IU/L (ref 38–126)
ALT (SGPT): 20 IU/L (ref 14–54)
ANION GAP: 6 mmol/L (ref 3–11)
AST (SGOT): 28 IU/L (ref 15–41)
BILIRUBIN, TOTAL: 0.4 mg/dL (ref 0.3–1.2)
BILIRUBIN, TOTAL: 0.4 mg/dL (ref 0.3–1.2)
BUN: 20 mg/dL (ref 6–20)
CALCIUM: 9.5 mg/dL (ref 8.8–10.2)
CARBON DIOXIDE: 28 mmol/L (ref 22–32)
CHLORIDE: 105 mmol/L (ref 101–111)
CREATININE: 0.82 mg/dL (ref 0.44–1.00)
ESTIMATED GLOMERULAR FILTRATION RATE: >60 mL/min (ref >60)
GLUCOSE: 61 mg/dL — ABNORMAL LOW (ref 70–110)
POTASSIUM: 3.9 mmol/L (ref 3.4–5.1)
SODIUM: 139 mmol/L (ref 136–145)
TOTAL PROTEIN: 6.9 g/dL (ref 6.4–8.3)

## 2014-09-12 NOTE — Progress Notes (Signed)
Quick Note:    Your blood count, electrolytes , liver & kidney function are normal.sugar was on the lower side .  Any of the other highlighted results that states abnormal has been reviewed & is considered normal.f/u in ER if symptoms are worse ,if unable to keep fluids down or have dizziness.  ______

## 2014-09-12 NOTE — Progress Notes (Signed)
Quick Note:    Current Outpatient Prescriptions:  nitrofurantoin (MACROBID) 100 mg Oral Capsule, Take 100 mg by mouth Every night  urine culture was negative on prelimnary report ,will await final report to send pt communication        ______

## 2014-09-13 LAB — URINE CULTURE

## 2014-09-13 NOTE — Progress Notes (Signed)
Quick Note:    Please inform ur culture multiple bacteria which was not significant, most likely contaminant ,advise f/u with PCP in 7 days if no improvement  ______

## 2014-09-16 ENCOUNTER — Ambulatory Visit (HOSPITAL_BASED_OUTPATIENT_CLINIC_OR_DEPARTMENT_OTHER)
Admission: RE | Admit: 2014-09-16 | Discharge: 2014-09-16 | Disposition: A | Discharge status: Home / Self Care | Payer: Medicare Other | Source: Ambulatory Visit | Attending: Family Medicine | Admitting: Family Medicine

## 2014-09-16 DIAGNOSIS — R197 Diarrhea, unspecified: Secondary | ICD-10-CM | POA: Insufficient documentation

## 2014-09-16 DIAGNOSIS — R109 Unspecified abdominal pain: Secondary | ICD-10-CM | POA: Insufficient documentation

## 2014-09-23 ENCOUNTER — Encounter (INDEPENDENT_AMBULATORY_CARE_PROVIDER_SITE_OTHER): Payer: Self-pay | Admitting: Family Medicine

## 2014-09-23 ENCOUNTER — Ambulatory Visit (INDEPENDENT_AMBULATORY_CARE_PROVIDER_SITE_OTHER): Payer: Medicare Other | Admitting: Family Medicine

## 2014-09-23 ENCOUNTER — Ambulatory Visit
Admission: RE | Admit: 2014-09-23 | Discharge: 2014-09-23 | Disposition: A | Discharge status: Home / Self Care | Payer: Medicare Other | Source: Ambulatory Visit | Attending: Family Medicine | Admitting: Family Medicine

## 2014-09-23 VITALS — BP 129/83 | HR 69 | Temp 98.4°F | Resp 16 | Ht 67.5 in | Wt 188.3 lb

## 2014-09-23 DIAGNOSIS — R1032 Left lower quadrant pain: Secondary | ICD-10-CM

## 2014-09-23 DIAGNOSIS — Z6829 Body mass index (BMI) 29.0-29.9, adult: Secondary | ICD-10-CM

## 2014-09-23 LAB — COMPREHENSIVE METABOLIC PROFILE - BMC/JMC ONLY
ALBUMIN/GLOBULIN RATIO: 1.6
ALBUMIN: 3.8 g/dL (ref 3.5–5.0)
ALKALINE PHOSPHATASE: 47 IU/L (ref 38–126)
ALT (SGPT): 22 IU/L (ref 14–54)
ANION GAP: 6 mmol/L (ref 3–11)
AST (SGOT): 30 IU/L (ref 15–41)
BILIRUBIN, TOTAL: 0.7 mg/dL (ref 0.3–1.2)
BUN: 19 mg/dL (ref 6–20)
CALCIUM: 8.9 mg/dL (ref 8.8–10.2)
CALCIUM: 8.9 mg/dL (ref 8.8–10.2)
CARBON DIOXIDE: 25 mmol/L (ref 22–32)
CHLORIDE: 108 mmol/L (ref 101–111)
CREATININE: 0.72 mg/dL (ref 0.44–1.00)
ESTIMATED GLOMERULAR FILTRATION RATE: >60 mL/min (ref >60)
GLUCOSE: 118 mg/dL — ABNORMAL HIGH (ref 70–110)
POTASSIUM: 3.9 mmol/L (ref 3.4–5.1)
SODIUM: 139 mmol/L (ref 136–145)
SODIUM: 139 mmol/L (ref 136–145)
TOTAL PROTEIN: 6.1 g/dL — ABNORMAL LOW (ref 6.4–8.3)

## 2014-09-23 LAB — CBC
BASOPHIL #: 0 10*3/uL (ref 0.0–0.10)
BASOPHILS %: 0.6 % (ref 0–2.50)
EOSINOPHIL #: 0.2 K/uL (ref 0.00–0.50)
EOSINOPHIL %: 3.7 % (ref 0.0–5.2)
HCT: 33.5 % — ABNORMAL LOW (ref 36.0–48.0)
HGB: 10.8 g/dL — ABNORMAL LOW (ref 12.0–16.0)
LYMPHOCYTE #: 1.1 K/uL (ref 0.7–3.20)
LYMPHOCYTE %: 24.6 % (ref 15.0–43.0)
MCH: 30.5 pg (ref 28.3–34.3)
MCHC: 32.3 g/dL (ref 32.0–36.0)
MCV: 94.4 fL (ref 82.0–100.0)
MONOCYTE #: 0.3 K/uL (ref 0.20–0.90)
MONOCYTE %: 7.2 % (ref 4.8–12.0)
MPV: 9.2 fL (ref 7.4–10.45)
NRBC ABSOLUTE: 0 10*3/uL (ref 0–0.02)
NRBC: 0.1 /100 WBC (ref 0–0.3)
PLATELET COUNT: 153 K/uL (ref 150–400)
PMN #: 2.9 K/uL (ref 1.5–6.5)
PMN %: 63.9 % (ref 43.0–76.0)
RBC: 3.55 M/uL — ABNORMAL LOW (ref 4.0–5.6)
RDW: 13.1 % (ref 11.0–16.0)
WBC: 4.6 10*3/uL (ref 4.0–11.0)

## 2014-09-23 LAB — LIPASE
LIPASE: 26 U/L (ref 22–51)
LIPASE: 26 U/L (ref 22–51)

## 2014-09-23 LAB — AMYLASE: AMYLASE: 73 U/L (ref 5–100)

## 2014-09-23 MED ORDER — METRONIDAZOLE 500 MG TABLET
500.00 mg | ORAL_TABLET | Freq: Three times a day (TID) | ORAL | Status: DC
Start: 2014-09-23 — End: 2014-09-28

## 2014-09-23 MED ORDER — CIPROFLOXACIN 500 MG TABLET
500.00 mg | ORAL_TABLET | Freq: Two times a day (BID) | ORAL | Status: DC
Start: 2014-09-23 — End: 2014-09-28

## 2014-09-23 NOTE — Progress Notes (Signed)
Patient name .[name]  Date of birth:[birth year]    Date of service:09/23/2014    CHIEF COMPLAINT:Abdominal Pain    SUBJECTIVE:   [name] is a 64 y.o. who presents for complaints of left lower quadrant abdominal pain for the past 2 weeks, reports it's a constant pain, 7/10, reports it feels like a pulsating ache, eating makes it worse and resting and applying a heating pad and clear liquids makes it better. She has had history of diverticulitis and has been on medical treatment in the past which seemed to have helped and this pain feels like the same symptoms that she has had in the past when she had the diverticulitis. Patient denies fever, nausea, vomiting, blood in the stool. Stools have been regular, not constipated,last BM last night . She has a family history of colon cancer in both parents and she has had 3 colonoscopies since age 70 which were all normal, last colonoscopy was done 3 years ago.She was seen on 09/10/14 for the same abdominal pain and was treated conservatively, had diarrhea at that time but her diarrhea resolved the next day after she was seen in the clinic. She did have lab work done which were all normal except for mild anemia, she had U/s of abdomen which was normal on 09/15/14. She has history of iron deficiency anemia which had been treated in the past with IV times same she could not tolerate p.o. 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  Allergies.   Allergies   Allergen Reactions    Percocet [Oxycodone-Acetaminophen] Nausea/ Vomiting       Past medical history.   Past Medical History   Diagnosis Date    Breast lump 2005     Resolved prior to biopsy    LLQ abdominal pain     Hx of migraines     Iron deficiency anemia     Pancytopenia      Social history.   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   Family History   Problem Relation Age of Onset    Congestive Heart Failure Mother     Colon Cancer Mother     Asthma Mother     Diabetes Father     Colon Cancer Father     Asthma Sister     Asthma Brother     No Known Problems Daughter    Review of Systems   Pertinent items are noted in HPI. All pertinent positives and negatives noted in the HPI  Constitutional: No recent illness, no fever, no chills, no night sweats, no weight loss  Neuro: No dizziness, No lightheadedness, No syncope, no hx of seizures  Eyes: No blurring of vision or diplopia  ENT: No dysphagia, No odynophagia, No  hearing loss, No tinnitus, No rhinorrhea, No sinus pains,   Respiratory: No cough, No shortness of breath, No wheezing, No hemoptysis  Cardiovascular: no chest pain  No palpitation, No exertional dyspnea, No claudications, No edema  GI: Denies nausea, vomiting, diarrhea  GU: Denies dysuria, frequency of urination, vaginal discharge  Muskuloskeletal: No myalgias, No arthralgias  Skin: no  rashes    OBJECTIVE:  Patient appears well,in NAD,Blood pressure 129/83, pulse 69, temperature 36.9 C (98.4 F), temperature source Oral, resp. rate 16, height 1.715 m (5' 7.5"), weight 85.412 kg (188 lb 4.8 oz), SpO2 99 %.  HEENT:mucous membranes moist,   no cervical adenopathy, no JVD or carotid bruit.  Heart:  Regular rate and rhythm, no murmur.  Lungs are clear to auscultation bilaterally.    Abdomen:  Soft,tender in LLQ ,no rebound or guarding , bowel sounds positive throughout the abdomen.no CVA tenderness.   Extremities:  No edema, no cyanosis, positive bilateral peripheral pulses, no motor or sensory deficits.    Assessment and  plan:  (R10.32) Left lower quadrant pain    Most likely diverticulitis, empirically started her on ciprofloxacin and Flagyl. Advised to avoid alcohol with Flagyl. Advised her to followup in the ER if symptoms worsen and with primary care for close followup in one day.odered CBC, CMP, amylase, lipase, CT scan of the abdomen with contrast. Discussed with patient that it might need preauthorization and has to discuss with her primary care for it. She agrees with the plan and verbalized understanding. All questions answered.    Maura Crandall, MD  09/23/2014, 09:54

## 2014-09-23 NOTE — Progress Notes (Signed)
BP 129/83 mmHg   Pulse 69   Temp(Src) 36.9 C (98.4 F) (Oral)   Resp 16   Ht 1.715 m (5' 7.5")   Wt 85.412 kg (188 lb 4.8 oz)   BMI 29.04 kg/m2   SpO2 99%  Rose Fillers, RN  09/23/2014, 09:41

## 2014-09-24 ENCOUNTER — Encounter (INDEPENDENT_AMBULATORY_CARE_PROVIDER_SITE_OTHER): Payer: Self-pay | Admitting: Family Medicine

## 2014-09-24 NOTE — Progress Notes (Signed)
Attempted a courtesy call back with patient today and there was no answer.  Marzella Schlein, MA  09/24/2014, 10:14

## 2014-09-25 ENCOUNTER — Ambulatory Visit
Admission: RE | Admit: 2014-09-25 | Discharge: 2014-09-25 | Disposition: A | Discharge status: Home / Self Care | Payer: Medicare Other | Source: Ambulatory Visit | Attending: Family Medicine | Admitting: Family Medicine

## 2014-09-25 ENCOUNTER — Other Ambulatory Visit (HOSPITAL_BASED_OUTPATIENT_CLINIC_OR_DEPARTMENT_OTHER): Payer: Self-pay | Admitting: Family Medicine

## 2014-09-25 DIAGNOSIS — D508 Other iron deficiency anemias: Secondary | ICD-10-CM | POA: Insufficient documentation

## 2014-09-25 DIAGNOSIS — K5792 Diverticulitis of intestine, part unspecified, without perforation or abscess without bleeding: Secondary | ICD-10-CM

## 2014-09-25 LAB — COMPREHENSIVE METABOLIC PROFILE - BMC/JMC ONLY
ALBUMIN/GLOBULIN RATIO: 1.7
ALBUMIN: 4.2 g/dL (ref 3.5–5.0)
ALKALINE PHOSPHATASE: 53 IU/L (ref 38–126)
ALT (SGPT): 21 IU/L (ref 14–54)
ANION GAP: 7 mmol/L (ref 3–11)
AST (SGOT): 29 IU/L (ref 15–41)
BILIRUBIN, TOTAL: 0.7 mg/dL (ref 0.3–1.2)
BUN: 15 mg/dL (ref 6–20)
CALCIUM: 9.2 mg/dL (ref 8.8–10.2)
CARBON DIOXIDE: 28 mmol/L (ref 22–32)
CHLORIDE: 106 mmol/L (ref 101–111)
CREATININE: 0.6 mg/dL (ref 0.44–1.00)
ESTIMATED GLOMERULAR FILTRATION RATE: >60 mL/min (ref >60)
GLUCOSE: 86 mg/dL (ref 70–110)
POTASSIUM: 4.2 mmol/L (ref 3.4–5.1)
SODIUM: 141 mmol/L (ref 136–145)
TOTAL PROTEIN: 6.6 g/dL (ref 6.4–8.3)

## 2014-09-25 LAB — CBC
BASOPHIL #: 0 10*3/uL (ref 0.0–0.10)
BASOPHILS %: 0.8 % (ref 0–2.50)
EOSINOPHIL #: 0.2 K/uL (ref 0.00–0.50)
EOSINOPHIL %: 3.9 % (ref 0.0–5.2)
HCT: 35.7 % — ABNORMAL LOW (ref 36.0–48.0)
HGB: 11.5 g/dL — ABNORMAL LOW (ref 12.0–16.0)
LYMPHOCYTE #: 1.1 K/uL (ref 0.7–3.20)
LYMPHOCYTE %: 25.7 % (ref 15.0–43.0)
MCH: 30.5 pg (ref 28.3–34.3)
MCHC: 32.2 g/dL (ref 32.0–36.0)
MCV: 95 fL (ref 82.0–100.0)
MONOCYTE #: 0.4 K/uL (ref 0.20–0.90)
MONOCYTE %: 8.8 % (ref 4.8–12.0)
MPV: 9 fL (ref 7.4–10.45)
NRBC ABSOLUTE: 0 10*3/uL (ref 0–0.02)
NRBC: 0 /100 WBC (ref 0–0.3)
PLATELET COUNT: 159 K/uL (ref 150–400)
PMN #: 2.6 10*3/uL (ref 1.5–6.5)
PMN %: 60.8 % (ref 43.0–76.0)
RBC: 3.76 M/uL — ABNORMAL LOW (ref 4.0–5.6)
RDW: 13.3 % (ref 11.0–16.0)
WBC: 4.2 10*3/uL (ref 4.0–11.0)

## 2014-09-25 LAB — TRANSFERRIN SATURATION
IRON: 29 ug/dL (ref 28–170)
TRANSFERRIN SATURATION: 7 % — ABNORMAL LOW (ref 15–50)
TRANSFERRIN: 291 mg/dL (ref 192–382)

## 2014-09-25 LAB — FERRITIN: FERRITIN: 7.8 ng/mL — ABNORMAL LOW (ref 11.0–306.8)

## 2014-09-25 LAB — C-REACTIVE PROTEIN(CRP),INFLAMMATION: C-REACTIVE PROTEIN (CRP),INFLAMMATION: <5 mg/L (ref <10)

## 2014-09-28 ENCOUNTER — Ambulatory Visit (INDEPENDENT_AMBULATORY_CARE_PROVIDER_SITE_OTHER): Payer: Medicare Other | Admitting: Family Medicine

## 2014-09-28 ENCOUNTER — Emergency Department (EMERGENCY_DEPARTMENT_HOSPITAL): Payer: Medicare Other

## 2014-09-28 ENCOUNTER — Encounter (INDEPENDENT_AMBULATORY_CARE_PROVIDER_SITE_OTHER): Payer: Self-pay | Admitting: Family Medicine

## 2014-09-28 ENCOUNTER — Emergency Department
Admission: EM | Admit: 2014-09-28 | Discharge: 2014-09-29 | Disposition: A | Discharge status: Home / Self Care | Payer: Medicare Other | Attending: Emergency Medicine | Admitting: Emergency Medicine

## 2014-09-28 VITALS — BP 128/79 | HR 94 | Temp 98.2°F | Ht 68.0 in | Wt 180.0 lb

## 2014-09-28 DIAGNOSIS — R51 Headache: Secondary | ICD-10-CM | POA: Insufficient documentation

## 2014-09-28 DIAGNOSIS — Z885 Allergy status to narcotic agent status: Secondary | ICD-10-CM | POA: Insufficient documentation

## 2014-09-28 DIAGNOSIS — H53132 Sudden visual loss, left eye: Secondary | ICD-10-CM

## 2014-09-28 DIAGNOSIS — Z6827 Body mass index (BMI) 27.0-27.9, adult: Secondary | ICD-10-CM

## 2014-09-28 DIAGNOSIS — H547 Unspecified visual loss: Secondary | ICD-10-CM

## 2014-09-28 DIAGNOSIS — H5462 Unqualified visual loss, left eye, normal vision right eye: Principal | ICD-10-CM

## 2014-09-28 LAB — COMPREHENSIVE METABOLIC PROFILE - BMC/JMC ONLY
ALBUMIN/GLOBULIN RATIO: 1.7
ALBUMIN: 4.1 g/dL (ref 3.5–5.0)
ALKALINE PHOSPHATASE: 52 IU/L (ref 38–126)
ALT (SGPT): 23 IU/L (ref 14–54)
ANION GAP: 8 mmol/L (ref 3–11)
AST (SGOT): 33 IU/L (ref 15–41)
BILIRUBIN, TOTAL: 0.5 mg/dL (ref 0.3–1.2)
BUN: 23 mg/dL — ABNORMAL HIGH (ref 6–20)
CALCIUM: 9.3 mg/dL (ref 8.8–10.2)
CARBON DIOXIDE: 27 mmol/L (ref 22–32)
CHLORIDE: 104 mmol/L (ref 101–111)
CREATININE: 0.84 mg/dL (ref 0.44–1.00)
ESTIMATED GLOMERULAR FILTRATION RATE: >60 mL/min (ref >60)
GLUCOSE: 90 mg/dL (ref 70–110)
POTASSIUM: 4.4 mmol/L (ref 3.4–5.1)
SODIUM: 139 mmol/L (ref 136–145)
TOTAL PROTEIN: 6.5 g/dL (ref 6.4–8.3)

## 2014-09-28 LAB — CBC
BASOPHIL #: 0 K/uL (ref 0.0–0.10)
BASOPHILS %: 0.3 % (ref 0–2.50)
BASOPHILS %: 0.3 % (ref 0–2.50)
EOSINOPHIL #: 0.2 10*3/uL (ref 0.00–0.50)
EOSINOPHIL %: 3.6 % (ref 0.0–5.2)
HCT: 36.4 % (ref 36.0–48.0)
HGB: 11.7 g/dL — ABNORMAL LOW (ref 12.0–16.0)
LYMPHOCYTE #: 1.6 K/uL (ref 0.7–3.20)
LYMPHOCYTE %: 30.3 % (ref 15.0–43.0)
MCH: 30.3 pg (ref 28.3–34.3)
MCHC: 32.3 g/dL (ref 32.0–36.0)
MCV: 94 fL (ref 82.0–100.0)
MONOCYTE #: 0.6 K/uL (ref 0.20–0.90)
MONOCYTE %: 11.7 % (ref 4.8–12.0)
MPV: 9.4 fL (ref 7.4–10.45)
NRBC ABSOLUTE: 0.01 K/uL (ref 0–0.02)
NRBC: 0.1 /100 WBC (ref 0–0.3)
PLATELET COUNT: 169 10*3/uL (ref 150–400)
PMN #: 2.9 K/uL (ref 1.5–6.5)
PMN %: 54.1 % (ref 43.0–76.0)
RBC: 3.87 M/uL — ABNORMAL LOW (ref 4.0–5.6)
RDW: 13.1 % (ref 11.0–16.0)
WBC: 5.3 10*3/uL (ref 4.0–11.0)

## 2014-09-28 LAB — SEDIMENTATION RATE: SEDIMENTATION RATE: 5 mm/hr (ref 0–30)

## 2014-09-28 LAB — PT/INR
INR: 1.02
PROTHROMBIN TIME: 10.6 s (ref 9.2–12.3)

## 2014-09-28 LAB — PTT (PARTIAL THROMBOPLASTIN TIME): APTT: 28.8 s (ref 25–36.8)

## 2014-09-28 LAB — TROPONIN-I: TROPONIN-I: <0.02 ng/mL — ABNORMAL LOW (ref 0.02–0.06)

## 2014-09-28 MED ORDER — SODIUM CHLORIDE 0.9 % (FLUSH) INJECTION SYRINGE
2.50 mL | INJECTION | Freq: Three times a day (TID) | INTRAMUSCULAR | Status: DC
Start: 2014-09-28 — End: 2014-09-29

## 2014-09-28 NOTE — ED Nurses Note (Signed)
Pt. Ambulated to BR independently, steady gait noted.

## 2014-09-28 NOTE — ED Nurses Note (Signed)
Patient presents to the ED with c/o of left eye vision loss since 1000, patient denies hx, both eyes reactive to light. Alert and oriented x3

## 2014-09-28 NOTE — ED Nurses Note (Addendum)
CONTACTED [facility] FOR RETINA SPECIALIST ON CALL. DR ILYAS ON CALL.

## 2014-09-28 NOTE — ED Nurses Note (Signed)
Provider at pt. Bedside discussing plan of care with pt. And her spouse.

## 2014-09-28 NOTE — ED Nurses Note (Signed)
Pt. Ambulated to BR with stand by assist, steady gait noted.

## 2014-09-28 NOTE — ED Nurses Note (Signed)
Pt. Left eye symptoms remain unchanged.  Pt. States the "sheer curtain" remains, with blurriness, unable to make out shapes, or letters.   Pt. Does c/o posterior head tenderness, has history of migraines.  Denies having need for medication for management at this time.  No other symptoms noted.

## 2014-09-28 NOTE — ED Nurses Note (Signed)
Rounded on pt., denies experiencing any needs at this time.  Resting in bed.

## 2014-09-28 NOTE — ED Nurses Note (Signed)
CONTACTED [facility] FOR NEURO CONSULT DX VISION LOSS LEFT EYE. DR LYONS ON CALL.

## 2014-09-28 NOTE — Progress Notes (Signed)
Patient name .[name]  Date of birth:March 18, 1951    Date of service:09/28/2014    CHIEF COMPLAINT:Eye Exam    SUBJECTIVE:   [name] is a 64 y.o. patient reports that she couldn't see anything at all in the the left eye since 10 AM today and reports it feels like a frosted glass when she covers her right eye. Never had any history of diabetes or hypertension. Denies any numbness, tingling, paresthesias, weakness in the extremities. Denies any flashes of light in the left eye, headache, nausea, vomiting, dizziness. Her last vision check was 2 years ago and she has worn glasses since she was a teenager.  Allergies.   Allergies   Allergen Reactions    Percocet [Oxycodone-Acetaminophen] Nausea/ Vomiting   Past medical history.   Past Medical History   Diagnosis Date    Breast lump 2005     Resolved prior to biopsy    LLQ abdominal pain     Hx of migraines     Iron deficiency anemia     Pancytopenia      Social history. nonsmoker    Family history.   Family History   Problem Relation Age of Onset    Congestive Heart Failure Mother     Colon Cancer Mother     Asthma Mother     Diabetes Father     Colon Cancer Father     Asthma Sister     Asthma Brother     No Known Problems Daughter    Review of Systems   Pertinent items are noted in HPI. All pertinent positives and negatives noted in the HPI  Constitutional: No recent illness, no fever, no chills, no night sweats, no weight loss  Neuro: No dizziness, No lightheadedness, No syncope, no hx of seizures  Eyes: No blurring of vision or diplopia  ENT: No dysphagia, No odynophagia, No hearing loss, No tinnitus, No rhinorrhea, No sinus pains,   Respiratory: No cough, No shortness of breath, No wheezing, No hemoptysis  Cardiovascular: no chest pain  No palpitation, No exertional dyspnea, No claudications, No edema  Skin: no  rashes    OBJECTIVE:  Patient appears well,in NAD,Blood pressure 128/79, pulse 94, temperature 36.8 C (98.2 F),  temperature source Oral, height 1.727 m (5\' 8" ), weight 81.647 kg (180 lb), SpO2 97 %.  HEENT: PERRLA, EOMI in the rt eye Lt eye not reacting to light or accomadation ,no red reflex noted , no sinus tenderness bilaterally, no cervical adenopathy, no JVD or carotid bruit.  CNS A& OX 3 ,no gross motor or sensory deficits,rombergs negative .  Heart:  Regular rate and rhythm, no murmur.  Lungs are clear to auscultation bilaterally.    Extremities:  No edema, no cyanosis, positive bilateral peripheral pulses, no motor or sensory deficits.    Assessment and plan:  (H54.62) Vision loss of left eye  Sudden loss of vision loss advised patient to followup in the ER  for further evaluation. She signed AMA for ambulance transfer, and person she is going to go by private vehicle with a friend transporting her to the ER. Patient was discharged in stable condition. She agrees with the above plan and verbalized understanding. All questions answered.    Maura Crandall, MD  09/28/2014, 19:44

## 2014-09-28 NOTE — Progress Notes (Signed)
BP 128/79 mmHg  Pulse 94  Temp(Src) 36.8 C (98.2 F) (Oral)  Ht 1.727 m (5\' 8" )  Wt 81.647 kg (180 lb)  BMI 27.38 kg/m2  SpO2 97%  Tauren Delbuono, RT (R)  09/28/2014, 19:37

## 2014-09-28 NOTE — ED Provider Notes (Signed)
[facility]  Emergency Department     HISTORY OF PRESENT ILLNESS     Date:  09/28/2014  Patient's Name:  [name]  Date of Birth:  May 20, 1951      History provided by:  Patient      64 y/o female presents to the ED for evaluation of loss of vision. Patient reports she was in her usual state of health x 10 hours prior to arrival when loss of vision on left eye onset suddenly with associated head pain that onset x 30 minutes prior to arrival. She rates the severity of head pain with a 6/10. She denies any similar occurrences in the past. Pertinent negatives include no chest pain, no double vision, no shortness of breath, no nausea, no vomiting, no diarrhea, no arm weakness, no abdominal pain, no dizziness, and no constipation. Patient voices no other complaints at this time.      Review of Systems     Review of Systems   Constitutional: Negative for fever and chills.   Eyes: Positive for visual disturbance.   Respiratory: Negative for cough and shortness of breath.    Gastrointestinal: Negative for nausea, vomiting, abdominal pain and diarrhea.   Musculoskeletal: Negative for back pain.   Skin: Negative for rash.   All other systems reviewed and are negative.      Previous History     Past Medical History:  Past Medical History   Diagnosis Date    Breast lump 2005     Resolved prior to biopsy    LLQ abdominal pain     Hx of migraines     Iron deficiency anemia     Pancytopenia        Past Surgical History:  Past Surgical History   Procedure Laterality Date    Hx cesarean section  1993    Hx tonsillectomy  1955    Hx colonoscopy      Hx endoscopic sinus surgery         Social History:  History   Substance Use Topics    Smoking status: Never Smoker     Smokeless tobacco: Not on file    Alcohol Use: No     History   Drug Use No       Family History:  Family History   Problem Relation Age of Onset    Congestive Heart Failure Mother     Colon Cancer Mother      Asthma Mother     Diabetes Father     Colon Cancer Father     Asthma Sister     Asthma Brother     No Known Problems Daughter        Medication History:  Current Outpatient Prescriptions   Medication Sig    amoxicillin-pot clavulanate (AUGMENTIN) 500-125 mg Oral Tablet Take 1 Tab by mouth Every 8 hours    cream base no.171, bulk, Does not apply Cream by Does not apply route Once a day Meloxicam/lido/ect.    lidocaine (XYLOCAINE) 5 % Ointment by Apply Topically route Once per day as needed    Meloxicam (MOBIC) 15 mg Oral Tablet Take 15 mg by mouth Once a day    Sumatriptan-Naproxen (TREXIMET) 85-500 mg Oral Tablet Take at onset of headache, may repeat in 2 hours.  Max 2/d, max 3d/wk    Zolmitriptan (ZOMIG) 5 mg Oral Tablet Take at onset of headache, may repeat in 2 hours if needed.  Max 2/d, max  3d/wk       Allergies:  Allergies   Allergen Reactions    Percocet [Oxycodone-Acetaminophen] Nausea/ Vomiting       Physical Exam     Vitals:    BP 109/74 mmHg   Pulse 70   Temp(Src) 36.7 C (98 F)   Resp 18   Ht 1.727 m (_0 )   Wt 81.647 kg (180 lb)   BMI 27.38 kg/m2   SpO2 98%    Physical Exam   Nursing note and vitals reviewed.  Constitutional: Awake & alert  Head:  Atraumatic.  Normocephalic.    Eyes:  PERRL.  EOMI.  Conjunctivae are not pale. Decreased light reflex in left eye as compared to right.  Has sensation of curtains in left eye as well, especially with left eye gaze.  ENT:  Mucous membranes are moist and intact.  Oropharynx is clear and symmetric.  Patent airway.  Neck:  Supple.  Full ROM.  No JVD.  No lymphadenopathy.  Cardiovascular:  Regular rate.  Regular rhythm.  No murmurs, rubs, or gallops.  Distal pulses are 2+ and symmetric.  Pulmonary/Chest:  No evidence of respiratory distress.  Clear to auscultation bilaterally.  No wheezing, rales or rhonchi. Chest non-tender.  Abdominal:  Soft and non-distended.  There is no tenderness.  No rebound, guarding, or rigidity.  No organomegaly.  Good  bowel sounds.    Back:  No CVA tenderness. FROM.   Extremities:  No edema.   No cyanosis.  No clubbing.  Full range of motion in all extremities.  No calf tenderness.  Skin:  Skin is warm and dry.  No diaphoresis. No rash.   Neurological:  Alert, awake, and appropriate.  Normal speech.  Sensation normal. Motor strengths 5/5. CN II-XII intact.   Psychiatric:  Good eye contact.  Normal interaction, affect, and behavior.      Diagnostic Studies/Treatment     Medications:  Medications   NS flush syringe (not administered)       New Prescriptions    No medications on file       Labs:    Results for orders placed or performed during the hospital encounter of 09/28/14   CBC   Result Value Ref Range    WBC 5.3 4.0 - 11.0 K/uL    RBC 3.87 (L) 4.0 - 5.6 M/uL    HGB 11.7 (L) 12.0 - 16.0 g/dL    HCT 36.4 36.0 - 48.0 %    MCV 94.0 82.0 - 100.0 fL    MCH 30.3 28.3 - 34.3 pg    MCHC 32.3 32.0 - 36.0 g/dL    RDW 13.1 11.0 - 16.0 %    PLATELET COUNT 169 150 - 400 K/uL    MPV 9.4 7.4 - 10.45 fL    NRBC 0.1 0 - 0.3 /100 WBC    NRBC ABSOLUTE 0.01 0 - 0.02 K/uL    PMN % 54.1 43.0 - 76.0 %    LYMPHOCYTE % 30.3 15.0 - 43.0 %    MONOCYTE % 11.7 4.8 - 12.0 %    EOSINOPHIL % 3.6 0.0 - 5.2 %    BASOPHILS % 0.3 0 - 2.50 %    PMN # 2.9 1.5 - 6.5 K/uL    LYMPHOCYTE # 1.6 0.7 - 3.20 K/uL    MONOCYTE # 0.6 0.20 - 0.90 K/uL    EOSINOPHIL # 0.2 0.00 - 0.50 K/uL    BASOPHIL # 0.0 0.0 - 0.10 K/uL   COMPREHENSIVE  METABOLIC PROFILE - [facility] ONLY   Result Value Ref Range    GLUCOSE 90 70 - 110 mg/dL    BUN 23 (H) 6 - 20 mg/dL    CREATININE 0.84 0.44 - 1.00 mg/dL    ESTIMATED GLOMERULAR FILTRATION RATE >60 >60 ml/min    SODIUM 139 136 - 145 mmol/L    POTASSIUM 4.4 3.4 - 5.1 mmol/L    CHLORIDE 104 101 - 111 mmol/L    CARBON DIOXIDE 27 22 - 32 mmol/L    ANION GAP 8 3 - 11 mmol/L    CALCIUM 9.3 8.8 - 10.2 mg/dL    TOTAL PROTEIN 6.5 6.4 - 8.3 g/dL    ALBUMIN 4.1 3.5 - 5.0 g/dL    ALBUMIN/GLOBULIN RATIO 1.7     BILIRUBIN, TOTAL 0.5 0.3 - 1.2 mg/dL    AST  (SGOT) 33 15 - 41 IU/L    ALT (SGPT) 23 14 - 54 IU/L    ALKALINE PHOSPHATASE 52 38 - 126 IU/L   PT/INR   Result Value Ref Range    PROTHROMBIN TIME 10.6 9.2 - 12.3 sec    INR 1.02    PTT (PARTIAL THROMBOPLASTIN TIME)   Result Value Ref Range    APTT 28.8 25 - 36.8 sec   TROPONIN-I   Result Value Ref Range    TROPONIN-I <0.02 (L) 0.02 - 0.06 ng/mL   SEDIMENTATION RATE   Result Value Ref Range    SEDIMENTATION RATE 5 0 - 30 mm/hr       Radiology:  CT BRAIN WO IV CONTRAST    CT BRAIN WO IV CONTRAST    (Results Pending)   Imaging Studies: Imaging studies were ordered. Results contemporaneously interpreted by me:   CT BRAIN: NAF     ECG:  EKG: The emergency physician ordered, reviewed, and independently interpreted the EKG.              Time Interpreted: 2047   Rate: 81 bpm  Rhythm: NSR  Interpretation: Possible Left atrial enlargement. Borderline ECG.     Cardiac Monitoring: 81 bpm    Procedure     Procedures    Course/Disposition/Plan     Course:    No scalp pain, jaw claudication, unexplained weight loss to speak of. ESR is 5.  I initially spoke with Ophthalmology at [facility] who recommended close followup in the am.  At this juncture I also spoke with [facility] Neuro who is in agreement that stroke seems less likely.  Pt requesting to speak with [facility] Opht[facility] tonight.    Discussed with retina on call at [facility] Dr. Driscilla Moats regarding need for followup . After extensive discussion, noted that pt will need to be followed up tomorrow at 8am.  Recommended NPO and sleep on left side tonight.  Pt not wanting anything for pain at this time.  Has no other symptoms at this time as well.  Was able to delineate fingers at 6-8" and closer in left eye with glasses on.     Discussed this plan with pt's and they are in agreement w/ plan. Strict return precautions also discussed with patient and family.    Disposition:    Discharged    Follow up:   Riley Churches, MD  [address]  [place] [address]  [phone]    In 1  day  You need to present to Dr Driscilla Moats' office at [place] tomorrow. Do not eat or drink anything after midnight tonight.  Sleep on your left side tonight as well.       Clinical Impression:     Encounter Diagnosis   Name Primary?    Acute loss of vision, left Yes       Future Appointments Scheduled in Epic:  Future Appointments  Date Time Provider [place]   09/29/2014 9:30 AM CT [facility] 1 [facility]   12/29/2014 2:00 PM [name], MD [facility] None   01/25/2015 3:00 PM [name], MD [facility] [place]   This note is prepared by Rito Ehrlich, acting as Scribe for Dr. Jeanmarie Plant.    The scribe's documentation has been prepared under my direction and personally reviewed by me in its entirety.  I confirm that the note above accurately reflects all work, treatment, procedures, and medical decision making performed by me, Dr. Jeanmarie Plant.

## 2014-09-29 ENCOUNTER — Encounter (INDEPENDENT_AMBULATORY_CARE_PROVIDER_SITE_OTHER): Payer: Self-pay

## 2014-09-29 ENCOUNTER — Ambulatory Visit (HOSPITAL_BASED_OUTPATIENT_CLINIC_OR_DEPARTMENT_OTHER): Payer: Medicare Other

## 2014-09-29 NOTE — ED Nurses Note (Signed)
Patient discharged home with family.  AVS reviewed with patient/care giver.  A written copy of the AVS and discharge instructions was given to the patient/care giver.  Questions sufficiently answered as needed.  Patient/care giver encouraged to follow up with PCP as indicated.  In the event of an emergency, patient/care giver instructed to call 911 or go to the nearest emergency room.     Pt. Given envelope containing CT result disc and all lab work.  Pt. Given discharge instructions and verbalizes understanding regarding follow up appointment in the AM with retina specialist.  Escorted out of ED with husband at side.

## 2014-09-29 NOTE — Progress Notes (Signed)
Patient does not answer telephone on courtesy call back.  alp

## 2014-09-30 LAB — ECG 12-LEAD
Atrial Rate: 81 {beats}/min
Calculated P Axis: 67 degrees
Calculated R Axis: 22 degrees
PR Interval: 128 ms
QRS Duration: 94 ms
QT Interval: 382 ms
QTC Calculation: 443 ms
Ventricular rate: 81 {beats}/min

## 2014-10-08 ENCOUNTER — Ambulatory Visit: Admit: 2014-10-08 | Payer: Self-pay | Admitting: Ophthalmology

## 2014-10-08 SURGERY — VITRECTOMY 25 GAUGE, MECHANICAL, PARS PLANA
Anesthesia: Choice | Laterality: Left

## 2014-10-12 ENCOUNTER — Encounter (HOSPITAL_COMMUNITY): Payer: Self-pay

## 2014-10-26 MED ORDER — IOPAMIDOL 370 MG IODINE/ML (76 %) INTRAVENOUS SOLUTION
100.00 mL | INTRAVENOUS | Status: AC
Start: 2014-10-26 — End: 2014-10-27
  Administered 2014-10-27: 10:00:00 80 mL via INTRAVENOUS
  Filled 2014-10-26: qty 100

## 2014-10-27 ENCOUNTER — Ambulatory Visit (HOSPITAL_BASED_OUTPATIENT_CLINIC_OR_DEPARTMENT_OTHER)
Admission: RE | Admit: 2014-10-27 | Discharge: 2014-10-27 | Disposition: A | Discharge status: Home / Self Care | Payer: Medicare Other | Source: Ambulatory Visit | Attending: Family Medicine | Admitting: Family Medicine

## 2014-10-27 DIAGNOSIS — Q625 Duplication of ureter: Secondary | ICD-10-CM | POA: Insufficient documentation

## 2014-10-27 DIAGNOSIS — K5792 Diverticulitis of intestine, part unspecified, without perforation or abscess without bleeding: Secondary | ICD-10-CM

## 2014-10-27 NOTE — Ancillary Notes (Signed)
Acute diverticulitis  LLQ abdomen pain x 4 days.  Diarrhea  Anemia

## 2014-11-03 ENCOUNTER — Ambulatory Visit (INDEPENDENT_AMBULATORY_CARE_PROVIDER_SITE_OTHER): Payer: Medicare Other | Admitting: Surgery

## 2014-11-30 ENCOUNTER — Encounter: Payer: Self-pay | Admitting: Family Medicine

## 2014-11-30 NOTE — Progress Notes (Signed)
This writer called and left a message on the patient's home phone for her to call back and schedule an iron infusion.

## 2014-12-02 ENCOUNTER — Inpatient Hospital Stay
Admission: RE | Admit: 2014-12-02 | Discharge: 2014-12-02 | Disposition: A | Discharge status: Still In Hospital | Payer: Medicare Other | Source: Ambulatory Visit | Attending: Family Medicine | Admitting: Family Medicine

## 2014-12-02 VITALS — BP 122/64 | HR 68 | Temp 97.9°F | Resp 16 | Ht 66.54 in | Wt 186.8 lb

## 2014-12-02 DIAGNOSIS — D509 Iron deficiency anemia, unspecified: Secondary | ICD-10-CM | POA: Insufficient documentation

## 2014-12-02 MED ORDER — SODIUM FERRIC GLUCONATE COMPLEX IN SUCROSE 62.5 MG/5 ML INTRAVENOUS
125.0000 mg | Freq: Once | INTRAVENOUS | Status: AC
Start: 2014-12-02 — End: 2014-12-02
  Administered 2014-12-02: 0 mg via INTRAVENOUS
  Administered 2014-12-02: 125 mg via INTRAVENOUS
  Filled 2014-12-02: qty 10

## 2014-12-02 NOTE — Discharge Instructions (Signed)
Sodium Ferric Gluconate Complex injection What is this medicine? SODIUM FERRIC GLUCONATE COMPLEX (SOE dee um FER ik GLOO koe nate KOM pleks) is an iron replacement. It is used with epoetin therapy to treat low iron levels in patients who are receiving hemodialysis. This medicine may be used for other purposes; ask your health care provider or pharmacist if you have questions. What should I tell my health care provider before I take this medicine? They need to know if you have any of the following conditions: -anemia that is not from iron deficiency -high levels of iron in the body -an unusual or allergic reaction to iron, benzyl alcohol, other medicines, foods, dyes, or preservatives -pregnant or are trying to become pregnant -breast-feeding How should I use this medicine? This medicine is for infusion into a vein. It is given by a health care professional in a hospital or clinic setting. Talk to your pediatrician regarding the use of this medicine in children. While this drug may be prescribed for children as young as 6 years old for selected conditions, precautions do apply. Overdosage: If you think you have taken too much of this medicine contact a poison control center or emergency room at once. NOTE: This medicine is only for you. Do not share this medicine with others. What if I miss a dose? It is important not to miss your dose. Call your doctor or health care professional if you are unable to keep an appointment. What may interact with this medicine? Do not take this medicine with any of the following medications: -deferoxamine -dimercaprol -other iron products This medicine may also interact with the following medications: -chloramphenicol -deferasirox -medicine for blood pressure like enalapril This list may not describe all possible interactions. Give your health care provider a list of all the medicines, herbs, non-prescription drugs, or dietary supplements you use. Also tell  them if you smoke, drink alcohol, or use illegal drugs. Some items may interact with your medicine. What should I watch for while using this medicine? Your condition will be monitored carefully while you are receiving this medicine. Visit your doctor for check-ups as directed. What side effects may I notice from receiving this medicine? Side effects that you should report to your doctor or health care professional as soon as possible: -allergic reactions like skin rash, itching or hives, swelling of the face, lips, or tongue -breathing problems -changes in hearing -changes in vision -chills, flushing, or sweating -fast, irregular heartbeat -feeling faint or lightheaded, falls -fever, flu-like symptoms -high or low blood pressure -pain, tingling, numbness in the hands or feet -severe pain in the chest, back, flanks, or groin -swelling of the ankles, feet, hands -trouble passing urine or change in the amount of urine -unusually weak or tired Side effects that usually do not require medical attention (report to your doctor or health care professional if they continue or are bothersome): -cramps -dark colored stools -diarrhea -headache -nausea, vomiting -stomach upset This list may not describe all possible side effects. Call your doctor for medical advice about side effects. You may report side effects to FDA at 1-800-FDA-1088. Where should I keep my medicine? This drug is given in a hospital or clinic and will not be stored at home. NOTE: This sheet is a summary. It may not cover all possible information. If you have questions about this medicine, talk to your doctor, pharmacist, or health care provider.    2016, Elsevier/Gold Standard. (2008-01-22 15:58:57)  

## 2014-12-02 NOTE — Nurses Notes (Signed)
Patient discharged home.  AVS reviewed with patient/care giver.  A written copy of the AVS and discharge instructions was given to the patient/care giver.  Questions sufficiently answered as needed.  Patient/care giver encouraged to follow up with PCP as indicated.  In the event of an emergency, patient/care giver instructed to call 911 or go to the nearest emergency room.

## 2014-12-29 ENCOUNTER — Ambulatory Visit (INDEPENDENT_AMBULATORY_CARE_PROVIDER_SITE_OTHER): Payer: Medicare Other | Admitting: Family Medicine

## 2014-12-29 ENCOUNTER — Encounter (INDEPENDENT_AMBULATORY_CARE_PROVIDER_SITE_OTHER): Payer: Self-pay | Admitting: Family Medicine

## 2014-12-29 VITALS — BP 108/78 | HR 67 | Temp 98.5°F | Resp 14 | Ht 66.5 in | Wt 189.2 lb

## 2014-12-29 DIAGNOSIS — M2550 Pain in unspecified joint: Secondary | ICD-10-CM

## 2014-12-29 DIAGNOSIS — H5462 Unqualified visual loss, left eye, normal vision right eye: Secondary | ICD-10-CM

## 2014-12-29 DIAGNOSIS — Z1239 Encounter for other screening for malignant neoplasm of breast: Secondary | ICD-10-CM

## 2014-12-29 DIAGNOSIS — D649 Anemia, unspecified: Secondary | ICD-10-CM

## 2014-12-29 DIAGNOSIS — E611 Iron deficiency: Principal | ICD-10-CM

## 2014-12-29 DIAGNOSIS — Z6830 Body mass index (BMI) 30.0-30.9, adult: Secondary | ICD-10-CM

## 2014-12-29 MED ORDER — MELOXICAM 15 MG TABLET
15.0000 mg | ORAL_TABLET | Freq: Every day | ORAL | Status: DC
Start: 2014-12-29 — End: 2015-06-18

## 2014-12-29 NOTE — Progress Notes (Signed)
BP 108/78 mmHg  Pulse 67  Temp(Src) 36.9 C (98.5 F) (Oral)  Resp 14  Ht 1.689 m (5' 6.5")  Wt 85.821 kg (189 lb 3.2 oz)  BMI 30.08 kg/m2  Golden Hurter, LPN  3/37/4451, 46:04

## 2014-12-30 ENCOUNTER — Ambulatory Visit: Payer: Medicare Other | Attending: Family Medicine

## 2014-12-30 DIAGNOSIS — D649 Anemia, unspecified: Secondary | ICD-10-CM | POA: Insufficient documentation

## 2014-12-30 DIAGNOSIS — E611 Iron deficiency: Secondary | ICD-10-CM

## 2014-12-30 DIAGNOSIS — M2550 Pain in unspecified joint: Principal | ICD-10-CM | POA: Insufficient documentation

## 2014-12-30 LAB — CBC WITH DIFF
BASOPHIL #: 0 x10ˆ3/uL (ref 0.00–0.10)
BASOPHIL %: 1 % (ref 0–3)
EOSINOPHIL #: 0.1 x10ˆ3/uL (ref 0.00–0.50)
EOSINOPHIL %: 3 % (ref 0–5)
HCT: 37.5 % (ref 36.0–45.0)
HGB: 12.1 g/dL (ref 12.0–15.5)
LYMPHOCYTE #: 1 x10ˆ3/uL (ref 1.00–4.80)
LYMPHOCYTE %: 24 % (ref 15–43)
MCH: 29.8 pg (ref 27.5–33.2)
MCHC: 32.4 g/dL (ref 32.0–36.0)
MCV: 91.9 fL (ref 82.0–97.0)
MONOCYTE #: 0.3 x10ˆ3/uL (ref 0.20–0.90)
MONOCYTE %: 7 % (ref 5–12)
MPV: 9.5 fL (ref 7.4–10.5)
NEUTROPHIL #: 2.6 x10ˆ3/uL (ref 1.50–6.50)
NEUTROPHIL %: 65 % (ref 43–76)
PLATELETS: 152 x10ˆ3/uL (ref 150–450)
RBC: 4.08 x10ˆ6/uL (ref 4.00–5.10)
RDW: 14.8 % (ref 11.0–16.0)
WBC: 4 x10ˆ3/uL (ref 4.0–11.0)

## 2014-12-30 LAB — RHEUMATOID FACTOR, SERUM: RHEUMATOID FACTOR: <15 [IU]/mL (ref <=30)

## 2014-12-31 LAB — CYCLIC CITRULLINATED PEPTIDE ANTIBODIES, IGG, SERUM: CYCLIC CITRULLINATED PEPTIDE ANTIBODY IGG QUANT: <0.5 U/mL (ref <5.0)

## 2015-01-01 LAB — FERRITIN: FERRITIN: 16 ng/mL (ref 11–307)

## 2015-01-12 ENCOUNTER — Telehealth (INDEPENDENT_AMBULATORY_CARE_PROVIDER_SITE_OTHER): Payer: Self-pay | Admitting: Family Medicine

## 2015-01-12 DIAGNOSIS — Z1239 Encounter for other screening for malignant neoplasm of breast: Secondary | ICD-10-CM

## 2015-01-12 NOTE — Telephone Encounter (Signed)
Pt called regarding a mammogram that she ordered at her last appt. She normally gets her mammograms done at the [facility] in [place]. Doesn't look as though an order is in there. Pt states she has a history of lumps being removed from her breast and they are able to get her in for an appt on 08/22 if an order is placed. Can this be done? Please advise. Fax for [facility]: [phone].  Landis Martins  01/12/2015, 14:41

## 2015-01-14 NOTE — Telephone Encounter (Signed)
Order placed

## 2015-01-14 NOTE — Telephone Encounter (Signed)
Left vm to inform pt that order was placed and faxed to the [place] at [facility].  Landis Martins  01/14/2015, 15:00

## 2015-01-25 ENCOUNTER — Ambulatory Visit (HOSPITAL_BASED_OUTPATIENT_CLINIC_OR_DEPARTMENT_OTHER)
Admission: RE | Admit: 2015-01-25 | Discharge: 2015-01-25 | Disposition: A | Discharge status: Home / Self Care | Payer: Medicare Other | Source: Ambulatory Visit | Attending: Family Medicine | Admitting: Family Medicine

## 2015-01-25 ENCOUNTER — Other Ambulatory Visit (INDEPENDENT_AMBULATORY_CARE_PROVIDER_SITE_OTHER): Payer: Self-pay | Admitting: Family Medicine

## 2015-01-25 ENCOUNTER — Encounter (HOSPITAL_COMMUNITY): Payer: Self-pay

## 2015-01-25 ENCOUNTER — Encounter (INDEPENDENT_AMBULATORY_CARE_PROVIDER_SITE_OTHER): Payer: Self-pay | Admitting: Specialist

## 2015-01-25 ENCOUNTER — Ambulatory Visit
Admission: RE | Admit: 2015-01-25 | Discharge: 2015-01-25 | Disposition: A | Discharge status: Home / Self Care | Payer: Medicare Other | Source: Ambulatory Visit | Attending: Family Medicine | Admitting: Family Medicine

## 2015-01-25 ENCOUNTER — Ambulatory Visit (INDEPENDENT_AMBULATORY_CARE_PROVIDER_SITE_OTHER): Payer: Medicare Other | Admitting: Specialist

## 2015-01-25 VITALS — BP 138/74 | HR 78 | Ht 67.0 in | Wt 191.0 lb

## 2015-01-25 DIAGNOSIS — G43009 Migraine without aura, not intractable, without status migrainosus: Secondary | ICD-10-CM

## 2015-01-25 DIAGNOSIS — G4730 Sleep apnea, unspecified: Secondary | ICD-10-CM

## 2015-01-25 DIAGNOSIS — G479 Sleep disorder, unspecified: Secondary | ICD-10-CM

## 2015-01-25 DIAGNOSIS — Z6829 Body mass index (BMI) 29.0-29.9, adult: Secondary | ICD-10-CM

## 2015-01-25 DIAGNOSIS — Z1231 Encounter for screening mammogram for malignant neoplasm of breast: Secondary | ICD-10-CM | POA: Insufficient documentation

## 2015-01-25 DIAGNOSIS — R928 Other abnormal and inconclusive findings on diagnostic imaging of breast: Secondary | ICD-10-CM | POA: Insufficient documentation

## 2015-01-25 DIAGNOSIS — Z1239 Encounter for other screening for malignant neoplasm of breast: Secondary | ICD-10-CM

## 2015-01-25 DIAGNOSIS — G4719 Other hypersomnia: Secondary | ICD-10-CM

## 2015-01-25 MED ORDER — LEVETIRACETAM 250 MG TABLET
ORAL_TABLET | ORAL | Status: DC
Start: 2015-01-25 — End: 2015-07-26

## 2015-01-25 NOTE — Addendum Note (Signed)
Addended by: [name] B on: 01/25/2015 02:58 PM     Modules accepted: Orders

## 2015-01-25 NOTE — Progress Notes (Signed)
RE:  [name]          [birth year]    Dear [name], MD:    I had the pleasure of seeing [name] in follow-up at the [facility] on 01/25/2015.  Today she reports that her headaches have not changed.  She is currently taking nothing for prevention and using sumatriptan as needed for her headaches.  Severe Headaches: up to 5 per week but usually 2-3 per week  Response to abortive medications: Good  Side effects to medications: Zomig seemed to cause nausea    Results of previous testing: n/a    Current Outpatient Prescriptions   Medication Sig Dispense Refill    levETIRAcetam (KEPPRA) 250 mg Oral Tablet 1 bid 60 Tab 2    lidocaine (XYLOCAINE) 5 % Ointment by Apply Topically route Once per day as needed      Meloxicam (MOBIC) 15 mg Oral Tablet Take 1 Tab (15 mg total) by mouth Once a day 30 Tab 3    Sumatriptan Succinate (IMITREX) 100 mg Oral Tablet Take 100 mg by mouth Once per day as needed      Zolmitriptan (ZOMIG) 5 mg Oral Tablet Take at onset of headache, may repeat in 2 hours if needed.  Max 2/d, max 3d/wk 18 Tab 11     No current facility-administered medications for this visit.        Past medical history, family history, and social history have been reviewed and confirmed.    Sleep:trouble falling asleep and trouble staying asleep, excessive daytime sleepiness  Mood:Mixed    Filed Vitals:    01/25/15 1441   BP: 138/74   Pulse: 78   Height: 1.702 m (5\' 7" )   Weight: 86.637 kg (191 lb)     Her exam reveals she is normocephalic and atraumatic.  Mental status exam shows her to be oriented with good memory, attention, knowledge, and language.  Cranial nerve exam reveals equal and reactive pupils with intact conjugate eye movements, symmetric face, and clear speech, and normal hearing to voice.  Gait is steady and coordination is intact to finger-to-nose testing.  All four extremeties have normal strength.      ICD-10-CM    1. Migraine without aura G43.009 levETIRAcetam  (KEPPRA) 250 mg Oral Tablet      She will try Keppra at a low dose.  She is a candidate for a cgrp mAb trial.    The patient will follow up with me in 6 months or sooner if needed.    [name], MD 01/25/2015, 14:55    Zella Richer. Shon Baton MD  Director, [facility]  Associate Professor of Neurology  [facility]

## 2015-01-27 ENCOUNTER — Telehealth (INDEPENDENT_AMBULATORY_CARE_PROVIDER_SITE_OTHER): Payer: Self-pay | Admitting: Family Medicine

## 2015-01-27 NOTE — Telephone Encounter (Signed)
Patient notified no changes in her mammogram and breast U/S.  Hortencia Pilar, MA  01/27/2015, 09:27

## 2015-02-25 ENCOUNTER — Ambulatory Visit (HOSPITAL_BASED_OUTPATIENT_CLINIC_OR_DEPARTMENT_OTHER): Payer: Medicare PPO | Admitting: Gastroenterology

## 2015-02-25 ENCOUNTER — Encounter (HOSPITAL_BASED_OUTPATIENT_CLINIC_OR_DEPARTMENT_OTHER): Payer: Self-pay

## 2015-02-25 ENCOUNTER — Ambulatory Visit
Admission: RE | Admit: 2015-02-25 | Discharge: 2015-02-25 | Disposition: A | Discharge status: Home / Self Care | Payer: Medicare PPO | Source: Ambulatory Visit | Attending: Gastroenterology | Admitting: Gastroenterology

## 2015-02-25 ENCOUNTER — Ambulatory Visit (HOSPITAL_BASED_OUTPATIENT_CLINIC_OR_DEPARTMENT_OTHER): Payer: Medicare PPO | Admitting: Anesthesiology

## 2015-02-25 ENCOUNTER — Encounter (HOSPITAL_BASED_OUTPATIENT_CLINIC_OR_DEPARTMENT_OTHER)
Admission: RE | Disposition: A | Discharge status: Home / Self Care | Payer: Self-pay | Source: Ambulatory Visit | Attending: Gastroenterology

## 2015-02-25 DIAGNOSIS — Z1211 Encounter for screening for malignant neoplasm of colon: Secondary | ICD-10-CM | POA: Insufficient documentation

## 2015-02-25 DIAGNOSIS — Z8371 Family history of colonic polyps: Secondary | ICD-10-CM | POA: Insufficient documentation

## 2015-02-25 DIAGNOSIS — D123 Benign neoplasm of transverse colon: Secondary | ICD-10-CM | POA: Insufficient documentation

## 2015-02-25 DIAGNOSIS — K5730 Diverticulosis of large intestine without perforation or abscess without bleeding: Secondary | ICD-10-CM | POA: Insufficient documentation

## 2015-02-25 DIAGNOSIS — K621 Rectal polyp: Secondary | ICD-10-CM | POA: Insufficient documentation

## 2015-02-25 HISTORY — PX: COLONOSCOPY: SHX174

## 2015-02-25 HISTORY — DX: Bronchitis, not specified as acute or chronic: J40

## 2015-02-25 SURGERY — DONT USE, USE 1094-COLONOSCOPY, DIAGNOSTIC (SCREENING)
Anesthesia: Anesthesia MAC / Sedation | Site: Anus | Wound class: Clean Contaminated

## 2015-02-25 MED ORDER — PEG 3350-KCL-NABCB-NACL-NASULF 236 G PO SOLR
4.0000 L | ORAL | Status: DC | PRN
Start: 2015-02-25 — End: 2015-02-25

## 2015-02-25 MED ORDER — FENTANYL CITRATE (PF) 50 MCG/ML IJ SOLN (WRAP)
INTRAMUSCULAR | Status: AC
Start: 2015-02-25 — End: ?
  Filled 2015-02-25: qty 2

## 2015-02-25 MED ORDER — PROPOFOL 10 MG/ML IV EMUL (WRAP)
INTRAVENOUS | Status: AC
Start: 2015-02-25 — End: ?
  Filled 2015-02-25: qty 20

## 2015-02-25 MED ORDER — SODIUM CHLORIDE 0.9 % IV SOLN
INTRAVENOUS | Status: DC | PRN
Start: 2015-02-25 — End: 2015-02-25

## 2015-02-25 MED ORDER — SODIUM CHLORIDE 0.9 % IV SOLN
INTRAVENOUS | Status: DC
Start: 2015-02-25 — End: 2015-02-25

## 2015-02-25 MED ORDER — ONDANSETRON HCL 4 MG/2ML IJ SOLN
4.0000 mg | INTRAMUSCULAR | Status: DC | PRN
Start: 2015-02-25 — End: 2015-02-25

## 2015-02-25 MED ORDER — FENTANYL CITRATE (PF) 50 MCG/ML IJ SOLN (WRAP)
INTRAMUSCULAR | Status: DC | PRN
Start: 2015-02-25 — End: 2015-02-25
  Administered 2015-02-25 (×2): 50 ug via INTRAVENOUS

## 2015-02-25 MED ORDER — PROPOFOL 10 MG/ML IV EMUL (WRAP)
INTRAVENOUS | Status: DC | PRN
Start: 2015-02-25 — End: 2015-02-25
  Administered 2015-02-25: 90 mg via INTRAVENOUS
  Administered 2015-02-25: 50 mg via INTRAVENOUS
  Administered 2015-02-25: 60 mg via INTRAVENOUS
  Administered 2015-02-25: 50 mg via INTRAVENOUS

## 2015-02-25 MED ORDER — ONDANSETRON 4 MG PO TBDP
4.0000 mg | ORAL_TABLET | ORAL | Status: DC | PRN
Start: 2015-02-25 — End: 2015-02-25

## 2015-02-25 SURGICAL SUPPLY — 11 items
CLIP QUICKPRO REPOSITIONAL (Supply) IMPLANT
CONN IRRG TORRENT 1WAYVAL ENDO (Supply) ×2 IMPLANT
FORCEP BIOPSY HOT RADIAL JAW 4 (Supply) IMPLANT
FORCEP BIOPSY RAD JAW 1333-40 (Supply) IMPLANT
FORCEP RADIAL JAW JUMBO 240CM (Supply) ×2 IMPLANT
MARKER ENDOSCOPIC SPOT (Supply) IMPLANT
NDL INTERJECT SCLERO 25G (Supply) IMPLANT
SNARE ROTATE SM OVAL MED STFF (Supply) IMPLANT
SNARE SMALL CAPTIV 6230 (Supply) IMPLANT
SNARE WIRE DISP 10MM OVAL (Supply) ×2 IMPLANT
TUBING IRRIGATION TORRENT (Supply) ×2 IMPLANT

## 2015-02-25 NOTE — Transfer of Care (Signed)
Anesthesia Transfer of Care Note      Patient Name:     Nicole Sutton    Procedures Performed:     Procedure(s):  COLONOSCOPY    Anesthesia Type:     Conscious Sedation    Patient Location:     Endo PACU    Last Vitals:     Filed Vitals:    02/25/15 0956   BP: 115/69   Pulse: 79   Resp: 22   SpO2: 100%       Post Pain:     Patient not complaining of pain, continue current therapy    Mental Status:     Awake    Respiratory Function:     Adequate    Cardiovascular:       Stable    Nausea/Vomiting:      Patient not complaining of nausea     Hydration Status:     Adequate    Post Assessment:      No apparent anesthetic complications, no reportable events     Deon Pilling, MD  02/25/2015 9:58 AM               vss

## 2015-02-25 NOTE — Anesthesia Preprocedure Evaluation (Addendum)
Anesthesia Evaluation    AIRWAY    Mallampati: II    TM distance: >3 FB  Neck ROM: full  Mouth Opening:full   CARDIOVASCULAR    cardiovascular exam normal, regular and normal       DENTAL    no notable dental hx     PULMONARY    pulmonary exam normal and clear to auscultation     OTHER FINDINGS                      Anesthesia Plan    ASA 2     MAC                     intravenous induction   Detailed anesthesia plan: MAC        Post op pain management: per surgeon    informed consent obtained

## 2015-02-25 NOTE — Anesthesia Postprocedure Evaluation (Signed)
Anesthesia Post Operative Evaluation      Patient Name:     Nicole Sutton    Procedures Performed:     Procedure(s):  COLONOSCOPY    Anesthesia Type:      Conscious Sedation    Patient Location:     ENDO/ENDO    Last vitals:     Temp:     HR: Heart Rate: 79   BP: BP: 115/69 mmHg   RR: Resp Rate: 22   SpO2 SpO2: 100 %     Post Op Pain:      Patient not complaining of pain, continue current therapy     Mental Status:      Awake    Respiratory Function:      Tolerating nasal cannula    Cardiovascular:      Stable    Nausea/Vomiting:      Patient not complaining of nausea or vomiting    Hydration Status:      Adequate    Post Assessment:      no apparent anesthetic complications    Deon Pilling, MD  02/25/2015 9:58 AM      vss

## 2015-02-25 NOTE — Discharge Instructions (Signed)
[facility]  Colonoscopy/Sigmoidoscopy  Discharge Instructions    Thank you for allowing us to be a part of your health care experience.  We realize you may not fully recall your discharge care.  The following information is to guide you.    A.  After you leave the hospital:   1.  Due to the effects of the sedatives, you may feel tired for the remainder of today.   2.  DO NOT DRIVE OR OPERATE HAZARDOUS MACHINERY until tomorrow.   3.  Please rest and drink extra fluids today.  Avoid alcohol today.   4.  Resume your normal diet as tolerated.   5. If you experience anal soreness, you may apply a soothing ointment of your choice.   6.  You may feel bloated and pass air today.   7.  You may resume your normal activities (work) tomorrow.    B.  If you experience any of the following danger signs or symptoms, call your doctor immediately:  After business hours call [phone] for [facility] for physician guidance.    1.   Passing  Blood in the stool or a change of stool consistency (black).   2.  Passing Clotted blood.   3.  New onset of pain or distension, that does not subside or prevents you from normal activity.   4.  Redness or swelling at the IV insertion site that continues or worsens over 2-3 days. Initial warm compresses may be helpful.    All of us at the [facility] who supported you today during your procedure wish you a quick and comfortable recovery.  For any questions or concerns about your personal care, contact us at [phone].

## 2015-02-26 ENCOUNTER — Encounter (HOSPITAL_BASED_OUTPATIENT_CLINIC_OR_DEPARTMENT_OTHER): Payer: Self-pay | Admitting: Gastroenterology

## 2015-03-03 ENCOUNTER — Ambulatory Visit: Payer: Medicare Other | Attending: Specialist

## 2015-03-03 DIAGNOSIS — G4719 Other hypersomnia: Secondary | ICD-10-CM | POA: Insufficient documentation

## 2015-03-03 DIAGNOSIS — G4730 Sleep apnea, unspecified: Secondary | ICD-10-CM | POA: Insufficient documentation

## 2015-03-03 DIAGNOSIS — G478 Other sleep disorders: Secondary | ICD-10-CM

## 2015-03-03 DIAGNOSIS — G479 Sleep disorder, unspecified: Secondary | ICD-10-CM | POA: Insufficient documentation

## 2015-03-04 NOTE — Progress Notes (Signed)
Charges filed

## 2015-03-12 NOTE — Procedures (Signed)
[facility]                                         Sleep Laboratory                                           [address]                                    [place], [place] [address]                                          [phone]                                       PATIENT NAME:    [name] NUMBER: [id number]  DATE OF BIRTH:   28-Dec-1950    DATE OF SERVICE: 03/03/2015      March 11, 2015    [name], MD  [place], [place] [address]    Dear Dr. Shon Baton:    [name] is a 64 year old female who presents to [facility] Sleep [facility] on March 03, 2015, for an all-night polysomnogram.  As you recall, he is 64 year old female who is 170.2 cm in height and weighs 86.6 kg.  This gives her a BMI of 29.9.  Her neck circumference is 14 inches.  She presents with a chief complaint of migraine headaches.  The patient's past medical history is significant for migraine headaches and osteoarthritis.  Medications are meloxicam and lidocaine ointment.  Her past surgical history was obtained from our electronic medical record and is significant for C-section, tonsillectomy, colonoscopy, endoscopic sinus surgery and breast biopsy.  The patient states she goes to bed at 10:30.  It takes her a variable amount of time to fall asleep.  She wakes up several times during the night.  She gets up at 8:00 a.m.  She does not take any naps in the daytime.  She does not have excessive daytime sleepiness with a normal Epworth Sleepiness Scale of 7.    The patient was provided with 367 minutes of sleep opportunity.  She slept 296 minutes giving her a sleep efficiency index of 80.8%.  Her sleep latency was 27 minutes.  Her REM latency was 60 minutes.  She spent 37 minutes in slow wave sleep, comprising 12.5% of her total sleep time.  She spent 51 minutes in REM sleep comprising 17.2% of her total sleep  time.    The patient's study was scored according to the American Academy of Sleep Medicine adult scoring guidelines by polysomnography technologist, Gershon Mussel.  The patient was found to have 2 respiratory events throughout the entire sleep study.  These were described as hypopneas.  This gave her an overall study apnea-hypopnea index of 0.4 respiratory events per hour of sleep.  Her REM apnea-hypopnea index was 8.  Her apnea-hypopnea index according to position was as follows:  Supine 0, side 0.5, prone 0, upright 0.  Her lowest oxygen saturation associated with a respiratory event was 92%.  Her longest duration of desaturation was a 22 second obstructive hypopnea with a minimum 02 sat of 92%.  Her lowest recorded oxygen saturation was 83% for the entire night and only 2 minutes of the study was below this time.    The patient had 4 awakenings during the night.  She had 43 minutes of wake after sleep onset.  She had 52 arousals 19 of which were spontaneous, 20 associated with snores, 24 with leg movements and 1 with hypopneas.  The patient had 320 leg movements which were considered periodic giving her a periodic limb movement index of 65.2 events per hour of sleep.  Her EKG was unremarkable with an average asleep rate of 61.  Her EEG was unremarkable.  She did not have any unusual behaviors throughout sleep.    In summary, [name] is a 64 year old female who does not demonstrate sleep-disordered breathing.  She did not have hypoxemia.  She had a relatively preserved sleep efficiency index of 80%; normal sleep latency of 27 minutes and normal sleep architecture.  She did have an elevation in her periodic limb movement index of 65.2 events per hour of sleep and anything over 15 may be clinically significant.  If patient has clinical concerns for nonrestorative nighttime sleep or symptoms of restless leg syndrome, she may benefit from treatment.  We would be happy to see her in the [place] [facility]  disorders clinic at [phone] if you feel she would benefit and would like to refer her.  I hope you find this information helpful.    Thank you for allowing me to participate in the care of your patient.  Please do not hesitate to call back with any questions or concerns.    Sincerely,        Cleotis Nipper, MD  Associate Professor   [facility]    [id number]; D: 03/11/2015 10:17:51; T: 03/12/2015 10:01:57    cc: [name] MD      Suanne Marker

## 2015-03-18 ENCOUNTER — Encounter (HOSPITAL_BASED_OUTPATIENT_CLINIC_OR_DEPARTMENT_OTHER): Payer: Self-pay | Admitting: Specialist

## 2015-03-18 NOTE — Progress Notes (Signed)
03/18/2015, Tech LMOM for patient to call for her sleep study results.    Gershon Mussel, RPSGT

## 2015-03-19 ENCOUNTER — Encounter (HOSPITAL_BASED_OUTPATIENT_CLINIC_OR_DEPARTMENT_OTHER): Payer: Self-pay | Admitting: Pediatrics

## 2015-03-19 NOTE — Progress Notes (Signed)
Spoke with patient reference sleep study results, patient was given summary from report by Dr. Matthew Folks. Patient wants to see sleep specialist and will call Monday to make an appointment as patient is out of town right now. Theodosia Paling, RCP, RPSGT

## 2015-04-10 ENCOUNTER — Other Ambulatory Visit: Payer: Self-pay

## 2015-05-04 ENCOUNTER — Ambulatory Visit (HOSPITAL_BASED_OUTPATIENT_CLINIC_OR_DEPARTMENT_OTHER): Payer: Medicare Other | Admitting: Internal Medicine

## 2015-05-04 VITALS — BP 120/70 | HR 60 | Temp 99.2°F | Resp 12 | Ht 68.0 in | Wt 183.9 lb

## 2015-05-04 DIAGNOSIS — G43909 Migraine, unspecified, not intractable, without status migrainosus: Secondary | ICD-10-CM

## 2015-05-04 DIAGNOSIS — K117 Disturbances of salivary secretion: Secondary | ICD-10-CM

## 2015-05-04 DIAGNOSIS — R682 Dry mouth, unspecified: Secondary | ICD-10-CM

## 2015-05-04 DIAGNOSIS — Z6827 Body mass index (BMI) 27.0-27.9, adult: Secondary | ICD-10-CM

## 2015-05-04 DIAGNOSIS — J310 Chronic rhinitis: Secondary | ICD-10-CM

## 2015-05-04 DIAGNOSIS — G4769 Other sleep related movement disorders: Secondary | ICD-10-CM

## 2015-05-04 NOTE — Progress Notes (Signed)
Chief Complaint: Migraine headaches    HPI:   [name] is a 64 y.o. female referred to the [facility] by Dr. Shon Baton for evaluation of possible sleep apnea leading to chronic migraine headaches. She underwent a PSG . does not demonstrate sleep-disordered breathing. She did not have hypoxemia. She had a relatively preserved sleep efficiency index of 80%; normal sleep latency of 27 minutes and normal sleep architecture. She did have an elevation in her periodic limb movement index of 65.2 events per hour of sleep and anything over 15 may be clinically significant.She presents today to discuss the results of her sleep study. She reports waking frequently with dry mouth and nasal drainage. She has chronic urinary incontinence which leads to sleep disruption. This  incontinence began after a cesarean section.  She will wake-up at night to urinate multiple occasionally she will have trouble returning to sleep, but most nights she goes right back to sleep. She reports her indication for a PSG was concern for OSA causing morning headaches.   Duration of symptomatic sleep disorder: Unclear   Bedpartner present/absent: Not present   Sleep Hygiene:   Bedtime: 10 PM, earlier if she slept poorly the night before   Sleep Latency 5-10 minutes    Nocturnal awakenings: 3-4 times/ night   Latency after awakening: usually back to sleep in five minutes, occasionally will take up to an hour to return to sleep    Wake up time: 6 AM, occasionally will wake-up with migraine at 4-5 AM    Condition in the AM: "okay"   Naps/day: How many: 0  Weekends/Holidays/Vacations:No different   Shift work: None    Sleep Disordered Breathing:   Snoring: No   Apnea: No   AM Headache: Yes   Nocturnal Motor Activity: She does not notice leg movements   Diaphoresis: No   Previous W/U: No   Recent weight gain: No   Family History of OSA: No   Requiring separate bedrooms: No   Nocturnal Coughing: No   Daytime Sleepiness:      Excessive Daytime Sleepiness: No   Epworth Sleepiness Scale: 5   Sleepiness when driving: No   History of accidents from falling asleep: No   Decreased memory/ concentration: No   History of related disciplinary/ administrative action: No   Insomnia: No   Other Sleep Symptoms:   Sleep Paralysis: No   Hypnagogic Hallucinations: No   Cataplexy:No   Sleep Walking: No   Restless Legs: No   Bruxism: No   Nightmares: No   Past Medical History:   Past Medical History   Diagnosis Date    Breast lump 2005     Resolved prior to biopsy    LLQ abdominal pain     Hx of migraines     Iron deficiency anemia     Pancytopenia            Past Surgical History:   Past Surgical History   Procedure Laterality Date    Hx cesarean section  1993    Hx tonsillectomy  1955    Hx colonoscopy      Hx endoscopic sinus surgery      Hx breast biopsy Bilateral      benign            Allergies:   Allergies   Allergen Reactions    Percocet [Oxycodone-Acetaminophen] Nausea/ Vomiting      Current Medications:   No outpatient prescriptions have been marked as taking  for the 05/04/15 encounter (Office Visit) with Weldon Picking, MD.     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     Social History Main Topics    Smoking status: Never Smoker     Smokeless tobacco: Not on file    Alcohol Use: No    Drug Use: No    Sexual Activity:     Partners: Male     Other Topics Concern    Not on file     Social History Narrative      Family History:   Brother Alive Asthma   Daughter Alive No Known Problems   Father Alive Colon Cancer; Diabetes   Maternal Aunt  No Known Problems   Maternal Grandfather  No Known Problems   Maternal Grandmother  No Known Problems   Maternal Uncle  No Known  Problems   Mother Deceased Asthma; Colon Cancer; Congestive Heart Failure   Other  No Known Problems   Paternal Aunt  No Known Problems   Paternal Grandfather  No Known Problems   Paternal Grandmother  No Known Problems   Paternal Uncle  No Known Problems   Sister Alive Asthma   Son  No Known Problems         * No family history of OSA    Review of Systems:   Respiratory: Negative   Eyes: retina problem    ENT: Negative   Cardiovascular: Negative   Neurologic: Severe or frequent headaches and Numbness or tingling   Musculoskeletal: Arthritis   Gastrointestinal:Negative   Gentiourinary: Frequent urination and Incontinence   Skin: Negative    Endocrine: Negative   Psychiatric: Sleeping difficulties   Heme/Lymph/Immunologic: Easy bruising/bleeding   Physical Exam:   BP 120/70 mmHg   Pulse 60   Temp(Src) 37.3 C (99.2 F) (Thermal Scan)   Resp 12   Ht 1.727 m (5\' 8" )   Wt 83.4 kg (183 lb 13.8 oz)   BMI 27.96 kg/m2   SpO2 98% Body mass index is 27.96 kg/(m^2).   General appearance - alert, no distress   Nose/Sinuses:Nasal mucosa boggy and erythematous   Oropharynx:: Oropharynx Mallampati class II and Hard palate is normal   Neck: Circumference 39 cm @ cirsothyroid membrane and neck supple   Lungs: Clear to auscultation bilaterally and Chest symmetric with normal A/P diameter   Heart: S1,S2 regular. No murmurs, gallops, or rubs.   Abdomen: Abdomen soft, non-tender. Bowel sounds normal. No masses, organomegaly.   Extremities: No edema or skin discoloration .   Neuro Glossy intact and Gait normal     Impression:   1. Chronic Migraine Headaches  2. Xerostomia  3. Chronic rhinitis   4. Nocturnal Movements   Recommendations:   * No evidence of OSA on all night PSG.   * Discussed dry mouth. She may try Biotene Dry Mouth at night.   * Chronic nocturnal rhinitis advised trial of Fluticasone nasal OTC qhs.   * Denies any lower extremity symptoms that would be consistent RLS/PLMD. No indication for therapy at this time.   * She  does have low ferritin levels. Discussed relationship  between low ferritin levels and abnormal nocturnal movements. She does intermittently get IV supplementation.   * Follow-up prn.      Weldon Picking, MD

## 2015-06-18 ENCOUNTER — Other Ambulatory Visit (INDEPENDENT_AMBULATORY_CARE_PROVIDER_SITE_OTHER): Payer: Self-pay | Admitting: Family Medicine

## 2015-06-18 DIAGNOSIS — M2550 Pain in unspecified joint: Secondary | ICD-10-CM

## 2015-06-18 MED ORDER — MELOXICAM 15 MG TABLET
15.0000 mg | ORAL_TABLET | Freq: Every day | ORAL | 3 refills | Status: DC
Start: 2015-06-18 — End: 2015-11-26

## 2015-06-18 NOTE — Telephone Encounter (Signed)
No f/u apt scheduled. Do you want to see her before refill?  [name], [place]  06/18/2015, 14:20

## 2015-07-23 ENCOUNTER — Other Ambulatory Visit (HOSPITAL_COMMUNITY): Payer: Self-pay | Admitting: Orthopaedic Surgery

## 2015-07-23 ENCOUNTER — Other Ambulatory Visit (HOSPITAL_BASED_OUTPATIENT_CLINIC_OR_DEPARTMENT_OTHER): Payer: Self-pay | Admitting: Orthopaedic Surgery

## 2015-07-23 DIAGNOSIS — N951 Menopausal and female climacteric states: Secondary | ICD-10-CM

## 2015-07-23 DIAGNOSIS — Z780 Asymptomatic menopausal state: Secondary | ICD-10-CM

## 2015-07-26 ENCOUNTER — Ambulatory Visit (INDEPENDENT_AMBULATORY_CARE_PROVIDER_SITE_OTHER): Payer: Medicare Other | Admitting: Family Medicine

## 2015-07-26 ENCOUNTER — Encounter (INDEPENDENT_AMBULATORY_CARE_PROVIDER_SITE_OTHER): Payer: Self-pay | Admitting: Family Medicine

## 2015-07-26 ENCOUNTER — Ambulatory Visit: Payer: Medicare Other | Attending: Family Medicine

## 2015-07-26 VITALS — BP 114/74 | HR 76 | Temp 97.7°F | Resp 14 | Ht 67.0 in | Wt 185.0 lb

## 2015-07-26 DIAGNOSIS — E611 Iron deficiency: Secondary | ICD-10-CM

## 2015-07-26 DIAGNOSIS — R5383 Other fatigue: Secondary | ICD-10-CM | POA: Insufficient documentation

## 2015-07-26 DIAGNOSIS — R350 Frequency of micturition: Secondary | ICD-10-CM | POA: Insufficient documentation

## 2015-07-26 DIAGNOSIS — R109 Unspecified abdominal pain: Principal | ICD-10-CM

## 2015-07-26 DIAGNOSIS — F419 Anxiety disorder, unspecified: Secondary | ICD-10-CM

## 2015-07-26 DIAGNOSIS — Z6828 Body mass index (BMI) 28.0-28.9, adult: Secondary | ICD-10-CM

## 2015-07-26 LAB — CBC WITH DIFF
BASOPHIL #: 0 x10ˆ3/uL (ref 0.00–0.10)
BASOPHIL %: 1 % (ref 0–3)
EOSINOPHIL #: 0.1 x10ˆ3/uL (ref 0.00–0.50)
EOSINOPHIL %: 1 % (ref 0–5)
HCT: 38.9 % (ref 36.0–45.0)
HGB: 12.7 g/dL (ref 12.0–15.5)
LYMPHOCYTE #: 1.3 x10ˆ3/uL (ref 1.00–4.80)
LYMPHOCYTE %: 28 % (ref 15–43)
MCH: 31 pg (ref 27.5–33.2)
MCHC: 32.6 g/dL (ref 32.0–36.0)
MCV: 95.3 fL (ref 82.0–97.0)
MONOCYTE #: 0.4 x10ˆ3/uL (ref 0.20–0.90)
MONOCYTE %: 8 % (ref 5–12)
MPV: 10.3 fL (ref 7.4–10.5)
NEUTROPHIL #: 3 x10ˆ3/uL (ref 1.50–6.50)
NEUTROPHIL %: 62 % (ref 43–76)
PLATELETS: 168 x10ˆ3/uL (ref 150–450)
RBC: 4.08 x10ˆ6/uL (ref 4.00–5.10)
RDW: 13.2 % (ref 11.0–16.0)
WBC: 4.8 x10ˆ3/uL (ref 4.0–11.0)

## 2015-07-26 LAB — URINALYSIS, MACROSCOPIC
BILIRUBIN: NEGATIVE mg/dL
BLOOD: NEGATIVE mg/dL
GLUCOSE: NEGATIVE mg/dL
KETONES: NEGATIVE mg/dL
LEUKOCYTES: NEGATIVE WBCs/uL
NITRITE: NEGATIVE
PH: 6 (ref <8.0)
PROTEIN: NEGATIVE mg/dL
SPECIFIC GRAVITY: <=1.005 (ref <1.022)
UROBILINOGEN: 0.2 mg/dL (ref <=2.0)

## 2015-07-26 LAB — COMPREHENSIVE METABOLIC PROFILE - BMC/JMC ONLY
ALBUMIN/GLOBULIN RATIO: 1.9 (ref 0.8–2.0)
ALBUMIN/GLOBULIN RATIO: 1.9 (ref 0.8–2.0)
ALBUMIN: 4.5 g/dL (ref 3.5–5.0)
ALKALINE PHOSPHATASE: 44 U/L (ref 38–126)
ALT (SGPT): 20 U/L (ref 14–54)
ANION GAP: 10 mmol/L (ref 3–11)
AST (SGOT): 29 U/L (ref 15–41)
BILIRUBIN TOTAL: 0.8 mg/dL (ref 0.3–1.2)
BUN/CREA RATIO: 25 — ABNORMAL HIGH (ref 6–22)
BUN: 17 mg/dL (ref 6–20)
CALCIUM: 9.6 mg/dL (ref 8.8–10.2)
CHLORIDE: 101 mmol/L (ref 101–111)
CO2 TOTAL: 26 mmol/L (ref 22–32)
CREATININE: 0.69 mg/dL (ref 0.44–1.00)
ESTIMATED GFR: >60 mL/min/1.73m?2 (ref >60)
GLUCOSE: 84 mg/dL (ref 70–110)
POTASSIUM: 4.4 mmol/L (ref 3.4–5.1)
PROTEIN TOTAL: 6.9 g/dL (ref 6.4–8.3)
SODIUM: 137 mmol/L (ref 136–145)

## 2015-07-26 LAB — VITAMIN B12: VITAMIN B 12: 654 pg/mL (ref 180–914)

## 2015-07-26 LAB — C-REACTIVE PROTEIN(CRP),INFLAMMATION: CRP INFLAMMATION: 0.6 mg/L (ref <=10.0)

## 2015-07-26 LAB — FERRITIN: FERRITIN: 12 ng/mL (ref 11–307)

## 2015-07-26 MED ORDER — ALPRAZOLAM 0.25 MG TABLET
0.25 mg | ORAL_TABLET | Freq: Three times a day (TID) | ORAL | 0 refills | Status: DC | PRN
Start: 2015-07-26 — End: 2015-07-28

## 2015-07-26 MED ORDER — AMOXICILLIN 875 MG-POTASSIUM CLAVULANATE 125 MG TABLET
1.00 | ORAL_TABLET | Freq: Two times a day (BID) | ORAL | 0 refills | Status: DC
Start: 2015-07-26 — End: 2015-09-16

## 2015-07-26 NOTE — Progress Notes (Signed)
Outpatient Visit Note (Assment/Plan;Subjective;Objective)     Patient: [name] Provider: [name], MD   Date of Birth: [birth year] Date of Visit 07/26/2015      Assessment: Plan:       ICD-10-CM    1. Flank pain R10.9 URINALYSIS, MACROSCOPIC AND MICROSCOPIC W/CULTURE REFLEX        2. Urinary frequency R35.0 URINALYSIS, MACROSCOPIC AND MICROSCOPIC W/CULTURE REFLEX     amoxicillin-pot clavulanate (AUGMENTIN) 875-125 mg Oral Tablet        3. Iron deficiency E61.1 CBC/DIFF     FERRITIN   4. Abdominal pain, unspecified location R10.9 CBC/DIFF     C-REACTIVE PROTEIN(CRP),INFLAMMATION     COMPREHENSIVE METABOLIC PROFILE - [facility] ONLY     HGA1C (HEMOGLOBIN A1C WITH EST AVG GLUCOSE)     US PELVIS COMPLETE (TRANSABDOMINAL/TRANSVAGINAL)     amoxicillin-pot clavulanate (AUGMENTIN) 875-125 mg Oral Tablet   5. Anxiety  Risks and potential side effects of the medication were discussed with the patient.  The patient was instructed to call the office with any concerns while taking the medication. F41.9 ALPRAZolam (XANAX) 0.25 mg Oral Tablet   6. Fatigue, unspecified type R53.83 VITAMIN B12       Orders Placed This Encounter    US PELVIS COMPLETE (TRANSABDOMINAL/TRANSVAGINAL)    CBC/DIFF    C-REACTIVE PROTEIN(CRP),INFLAMMATION    FERRITIN    COMPREHENSIVE METABOLIC PROFILE - [facility] ONLY    HGA1C (HEMOGLOBIN A1C WITH EST AVG GLUCOSE)    URINALYSIS, MACROSCOPIC AND MICROSCOPIC W/CULTURE REFLEX    VITAMIN B12    CANCELED: POCT URINE DIPSTICK    amoxicillin-pot clavulanate (AUGMENTIN) 875-125 mg Oral Tablet    ALPRAZolam (XANAX) 0.25 mg Oral Tablet     Current Outpatient Prescriptions   Medication Sig    ALPRAZolam (XANAX) 0.25 mg Oral Tablet Take 1 Tab (0.25 mg total) by mouth Three times a day as needed for Insomnia    amoxicillin-pot clavulanate (AUGMENTIN) 875-125 mg Oral Tablet Take 1 Tab by mouth Every 12 hours    Glucosamine HCl 1,500 mg Oral Tablet Take by mouth    lidocaine (XYLOCAINE) 5 %  Ointment by Apply Topically route Once per day as needed    meloxicam (MOBIC) 15 mg Oral Tablet Take 1 Tab (15 mg total) by mouth Once a day    Sumatriptan Succinate (IMITREX) 100 mg Oral Tablet Take 100 mg by mouth Once per day as needed    Zolmitriptan (ZOMIG) 5 mg Oral Tablet Take at onset of headache, may repeat in 2 hours if needed.  Max 2/d, max 3d/wk     follow up in 1 month    [id number]    Subjective:   CC:   Chief Complaint   Patient presents with    Iron Deficiency    Flank Pain    Urinary Urgency     [name] is a 65 y.o. female here for multiple issues.      1. Low back pain/urinary frequency-1 month, has been cleaning out her father's house, has developed LLQ pain, no change in stool or rectal bleeding. Has been treated for diverticulitis in the past but CT scan was negative.Last colonoscopy was 6 months ago and was normal  2. Fatigue/increast thirst/urination-past month, denies dysuria or fever, has been craving ice.  3. Anxiety-intermittent,denies depression, sxs occurred more frequently due to trying settle out Fathers estate  Other medical issues include:  Patient Active Problem List   Diagnosis    Pancytopenia  Left lower quadrant pain    Iron deficiency    Fatigue    Dyspnea    MTHFR mutation (HCC)     No changes in [facility]    Past Medical History:   Diagnosis Date    Breast lump 2005    Resolved prior to biopsy    Hx of migraines     Iron deficiency anemia     LLQ abdominal pain     Pancytopenia          Past Surgical History:   Procedure Laterality Date    HX BREAST BIOPSY Bilateral     benign     HX CESAREAN SECTION  1993    HX COLONOSCOPY      HX ENDOSCOPIC SINUS SURGERY      HX TONSILLECTOMY  1955         Family History   Problem Relation Age of Onset    Congestive Heart Failure Mother     Colon Cancer Mother     Asthma Mother     Diabetes Father     Colon Cancer Father     Asthma Sister     Asthma Brother     No Known Problems Daughter     No Known  Problems Maternal Grandmother     No Known Problems Maternal Grandfather     No Known Problems Paternal Grandmother     No Known Problems Paternal Grandfather     No Known Problems Son     No Known Problems Maternal Aunt     No Known Problems Maternal Uncle     No Known Problems Paternal Aunt     No Known Problems Paternal Uncle     No Known Problems Other     Breast Cancer Neg Hx     Cervical Cancer Neg Hx     Liver Cancer Neg Hx     Lung Cancer Neg Hx     Lymphoma Neg Hx     Melanoma Neg Hx     Ovarian Cancer Neg Hx     Pancreatic Cancer Neg Hx     Thyroid Cancer Neg Hx     Uterine Cancer Neg Hx     Cancer Neg Hx     Prostate Cancer Neg Hx     Leukemia Neg Hx          Social History     Social History    Marital status: Married     Spouse name: N/A    Number of children: N/A    Years of education: N/A     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   Outpatient Medications Prior to Visit   Medication Sig Dispense Refill    lidocaine (XYLOCAINE) 5 % Ointment by Apply Topically route Once per day as needed  meloxicam (MOBIC) 15 mg Oral Tablet Take 1 Tab (15 mg total) by mouth Once a day 30 Tab 3    Sumatriptan Succinate (IMITREX) 100 mg Oral Tablet Take 100 mg by mouth Once per day as needed      Zolmitriptan (ZOMIG) 5 mg Oral Tablet Take at onset of headache, may repeat in 2 hours if needed.  Max 2/d, max 3d/wk 18 Tab 11    levETIRAcetam (KEPPRA) 250 mg Oral Tablet 1 bid 60 Tab 2     No facility-administered  medications prior to visit.      Objective:     Visit Vitals    BP 114/74    Pulse 76    Temp 36.5 C (97.7 F) (Oral)    Resp 14    Ht 1.702 m (5\' 7" )    Wt 83.9 kg (185 lb)    SpO2 98%    BMI 28.98 kg/m2    Body mass index is 28.98 kg/(m^2).    No LMP recorded. Patient is postmenopausal.  GEN: NAD  LUNGS: CTA  B, anteriorly and posteriorly  HRT: RRR, neg murmur  ABD: normal bowel sounds, soft, left LLQ tenderness, nondistended, no hepatosplenomegaly  SPINE:tight paraspinal muscles, no CVA tenderness  EXT: No edema    Labs:   Appointment on 07/26/2015   Component Date Value Ref Range Status    CRP INFLAMMATION 07/26/2015 0.6  <=10.0 mg/L Final    FERRITIN 07/26/2015 12  11 - 307 ng/mL Final    SODIUM 07/26/2015 137  136 - 145 mmol/L Final    POTASSIUM 07/26/2015 4.4  3.4 - 5.1 mmol/L Final    CHLORIDE 07/26/2015 101  101 - 111 mmol/L Final    CO2 TOTAL 07/26/2015 26  22 - 32 mmol/L Final    ANION GAP 07/26/2015 10  3 - 11 mmol/L Final    BUN 07/26/2015 17  6 - 20 mg/dL Final    CREATININE 07/26/2015 0.69  0.44 - 1.00 mg/dL Final    BUN/CREA RATIO 07/26/2015 25* 6 - 22 Final    ESTIMATED GFR 07/26/2015 >60  >60 mL/min/1.49m2 Final    ALBUMIN 07/26/2015 4.5  3.5 - 5.0 g/dL Final    CALCIUM 07/26/2015 9.6  8.8 - 10.2 mg/dL Final    GLUCOSE 07/26/2015 84  70 - 110 mg/dL Final    ALKALINE PHOSPHATASE 07/26/2015 44  38 - 126 U/L Final    ALT (SGPT) 07/26/2015 20  14 - 54 U/L Final    AST (SGOT) 07/26/2015 29  15 - 41 U/L Final    BILIRUBIN TOTAL 07/26/2015 0.8  0.3 - 1.2 mg/dL Final    PROTEIN TOTAL 07/26/2015 6.9  6.4 - 8.3 g/dL Final    ALBUMIN/GLOBULIN RATIO 07/26/2015 1.9  0.8 - 2.0 Final    VITAMIN B 12 07/26/2015 654  180 - 914 pg/mL Final    WBC 07/26/2015 4.8  4.0 - 11.0 x103/uL Final    RBC 07/26/2015 4.08  4.00 - 5.10 x106/uL Final    HGB 07/26/2015 12.7  12.0 - 15.5 g/dL Final    HCT 07/26/2015 38.9  36.0 - 45.0 % Final    MCV 07/26/2015 95.3  82.0 - 97.0 fL Final    MCH  07/26/2015 31.0  27.5 - 33.2 pg Final    MCHC 07/26/2015 32.6  32.0 - 36.0 g/dL Final    RDW 07/26/2015 13.2  11.0 - 16.0 % Final    PLATELETS 07/26/2015 168  150 - 450 x103/uL Final  MPV 07/26/2015 10.3  7.4 - 10.5 fL Final    NEUTROPHIL % 07/26/2015 62  43 - 76 % Final    LYMPHOCYTE % 07/26/2015 28  15 - 43 % Final    MONOCYTE % 07/26/2015 8  5 - 12 % Final    EOSINOPHIL % 07/26/2015 1  0 - 5 % Final    BASOPHIL % 07/26/2015 1  0 - 3 % Final    NEUTROPHIL # 07/26/2015 3.00  1.50 - 6.50 x103/uL Final    LYMPHOCYTE # 07/26/2015 1.30  1.00 - 4.80 x103/uL Final    MONOCYTE # 07/26/2015 0.40  0.20 - 0.90 x103/uL Final    EOSINOPHIL # 07/26/2015 0.10  0.00 - 0.50 x103/uL Final    BASOPHIL # 07/26/2015 0.00  0.00 - 0.10 x103/uL Final    COLOR 07/26/2015 Light Yellow  Light Yellow, Straw, Yellow Final    APPEARANCE 07/26/2015 Clear  Clear Final    PH 07/26/2015 6.0  <8.0 Final    LEUKOCYTES 07/26/2015 Negative  Negative WBCs/uL Final    NITRITE 07/26/2015 Negative  Negative Final    PROTEIN 07/26/2015 Negative  Negative mg/dL Final    GLUCOSE 07/26/2015 Negative  Negative mg/dL Final    KETONES 07/26/2015 Negative  Negative mg/dL Final    UROBILINOGEN 07/26/2015 0.2   <=2.0 mg/dL Final    BILIRUBIN 07/26/2015 Negative  Negative mg/dL Final    BLOOD 07/26/2015 Negative  Negative mg/dL Final    SPECIFIC GRAVITY 07/26/2015 <=1.005  <1.022 Final         [name], MD  [facility]  [address]  [id number]  [place] [address]  Dept: [phone]  Dept Fax: [phone]  Loc: [phone]  Loc Fax: [phone]

## 2015-07-26 NOTE — Progress Notes (Signed)
Visit Vitals    BP 114/74    Pulse 76    Temp 36.5 C (97.7 F) (Oral)    Resp 14    Ht 1.702 m (5\' 7" )    Wt 83.9 kg (185 lb)    BMI 28.98 kg/m2     Golden Hurter, LPN  [id number], [id number]

## 2015-07-27 LAB — HGA1C (HEMOGLOBIN A1C WITH EST AVG GLUCOSE)
ESTIMATED AVERAGE GLUCOSE: 100 mg/dL (ref 70–110)
GLYCOHEMOGLOBIN (HBA1C): 5.1 % (ref 4.0–6.0)
GLYCOHEMOGLOBIN (HBA1C): 5.1 % (ref 4.0–6.0)

## 2015-07-27 NOTE — H&P (Signed)
[facility]  [address], [id number]  [place], [place] [address]  Nicole Baron MD          ID: [name] is a 65 y.o. female  DOB: 1950/07/19   Date of Service: 12/29/2014     Chief Concern:   Chief Complaint   Patient presents with    Establish Care        History of present illness:   1. Anemia-h/o iron def cant absorb pills well  2.h/o vision loss-sees a retina specialist in [place] MD, no recent change  3. 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  Goes to Dr. Hervey Ard but unsure if has r/o RA  4. Prev. Care-last pap at 31 was normal, has colonoscopy every 5 years due to polyps    Past Medical History:   Patient Active Problem List    Diagnosis Date Noted    MTHFR mutation ([place]) 11/08/2010    Dyspnea 07/23/2010     ABN ECHO Prior hx of Phen Fen expsoure      Fatigue 07/06/2010     + EBV [place]      Iron deficiency 0000000     Cause uncertain; has had endoscopies  Does not absorb oral iron  Requires IV iron      Left lower quadrant pain 04/14/2010    Pancytopenia 04/09/2010     All tests negative  ferritins low end of normal  LLQ pain         Past Surgical History:   Past Surgical History:   Procedure Laterality Date    HX BREAST BIOPSY Bilateral     benign     HX CESAREAN SECTION  1993    HX COLONOSCOPY      HX ENDOSCOPIC SINUS SURGERY      HX TONSILLECTOMY  1955            Medications:   Current Outpatient Prescriptions   Medication Sig    ALPRAZolam (XANAX) 0.25 mg Oral Tablet Take 1 Tab (0.25 mg total) by mouth Three times a day as needed for Insomnia    amoxicillin-pot clavulanate (AUGMENTIN) 875-125 mg Oral Tablet Take 1 Tab by mouth Every 12 hours    Glucosamine HCl 1,500 mg Oral Tablet Take by mouth    lidocaine (XYLOCAINE) 5 % Ointment by Apply Topically route Once per day as needed    meloxicam (MOBIC) 15 mg Oral Tablet Take 1 Tab (15 mg total) by mouth Once a day    Sumatriptan Succinate (IMITREX) 100 mg Oral Tablet Take 100 mg  by mouth Once per day as needed    Zolmitriptan (ZOMIG) 5 mg Oral Tablet Take at onset of headache, may repeat in 2 hours if needed.  Max 2/d, max 3d/wk        Allergies:   Allergies   Allergen Reactions    Percocet [Oxycodone-Acetaminophen] Nausea/ Vomiting        Family History:   Family History   Problem Relation Age of Onset    Congestive Heart Failure Mother     Colon Cancer Mother     Asthma Mother     Diabetes Father     Colon Cancer Father     Asthma Sister     Asthma Brother     No Known Problems Daughter     No Known Problems Maternal Grandmother     No Known Problems Maternal Grandfather     No Known Problems Paternal Grandmother  No Known Problems Paternal Grandfather     No Known Problems Son     No Known Problems Maternal Aunt     No Known Problems Maternal Uncle     No Known Problems Paternal Aunt     No Known Problems Paternal Uncle     No Known Problems Other     Breast Cancer Neg Hx     Cervical Cancer Neg Hx     Liver Cancer Neg Hx     Lung Cancer Neg Hx     Lymphoma Neg Hx     Melanoma Neg Hx     Ovarian Cancer Neg Hx     Pancreatic Cancer Neg Hx     Thyroid Cancer Neg Hx     Uterine Cancer Neg Hx     Cancer Neg Hx     Prostate Cancer Neg Hx     Leukemia Neg Hx             Social History:   Social History     Social History    Marital status: Married     Spouse name: N/A    Number of children: N/A    Years of education: N/A     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     Social History Main Topics    Smoking status: Never Smoker    Smokeless tobacco: Not on file    Alcohol use No    Drug use: No    Sexual activity: Yes     Partners: Male     Other Topics Concern    Not on file     Social History Narrative        Review of Systems:  Constitutional: Denies fever, chills, unexplained weight change  Eyes: denies eye pain or discharge  ENT: Denies runny nose, sore throat or trouble with swallowing  Cardiovascular: Denies chest  pain, or palpitations  Respiratory: Denies dyspnea, cough, or wheezing  GI: Denies current abdominal pain, persistent heartburn, bright red blood or melena, constipation or diarrhea  GU: Denies dysuria, hematuria, incontinence, frequency or stream change  Musculoskeletal: Denies edema  Neurological: Denies syncope, or dizziness  Emotional/Pscychiatric: Denies depression , has occ anxiety, does not feel need to take medication  Skin: Denies rash, lesions or mole change  Endocrine: Denies excessive thirst, cold or heat intolerance       Physical Exam:  Visit Vitals    BP 108/78    Pulse 67    Temp 36.9 C (98.5 F) (Oral)    Resp 14    Ht 1.689 m (5' 6.5")    Wt 85.8 kg (189 lb 3.2 oz)    BMI 30.08 kg/m2        General: Pleasant 65 y.o., A/O, NAD   HEENT: Atraumatic. PERRLA, EOMI. Sclera non-injected and non-icteric. TMs clear bilaterally. Oropharynx clear without erythema or exudate.    Neck: Supple, no lymphadenopathy, bruit, or JVD.  No thyromegally.   Heart: Regular rate and rhythm without murmur or gallops.  Lungs: Clear to auscultation bilaterally without wheeze.  Abdomen: Soft, nontender, nondistended.  Bowel sounds positive.  No masses or organomegally.  Musculoskeletal: FROM in all extremities.  No joint tenderness, erythema, or swelling.  Extremities: No cyanosis or edema.  Distal pulses 2+ bilaterally.  Skin: Warm, dry, well perfused. Refill < 3s.  No rashes or significant lesions.  Neuro: CN II-XII intact.  DTR's 2+ bilaterally.  Normal gait.  No focal deficits.         Assessment and Plan:   1. Iron deficiency   Check labs   2. Encounter for breast cancer screening other than mammogram    3. Vision loss of left eye   Continue f/u with ophthalmology    4. Anemia, unspecified anemia type    5. Arthralgia   R/o RA            Nicole Baron, MD 07/27/2015, 10:24

## 2015-07-28 ENCOUNTER — Other Ambulatory Visit (INDEPENDENT_AMBULATORY_CARE_PROVIDER_SITE_OTHER): Payer: Self-pay | Admitting: Family Medicine

## 2015-07-28 ENCOUNTER — Ambulatory Visit
Admission: RE | Admit: 2015-07-28 | Discharge: 2015-07-28 | Disposition: A | Discharge status: Home / Self Care | Payer: Medicare Other | Source: Ambulatory Visit | Attending: Orthopaedic Surgery | Admitting: Orthopaedic Surgery

## 2015-07-28 DIAGNOSIS — M8580 Other specified disorders of bone density and structure, unspecified site: Secondary | ICD-10-CM | POA: Insufficient documentation

## 2015-07-28 DIAGNOSIS — F419 Anxiety disorder, unspecified: Secondary | ICD-10-CM

## 2015-07-28 DIAGNOSIS — Z780 Asymptomatic menopausal state: Secondary | ICD-10-CM | POA: Insufficient documentation

## 2015-07-28 MED ORDER — ALPRAZOLAM 0.25 MG TABLET
0.25 mg | ORAL_TABLET | Freq: Three times a day (TID) | ORAL | 0 refills | Status: DC | PRN
Start: 2015-07-28 — End: 2015-09-16

## 2015-08-01 ENCOUNTER — Other Ambulatory Visit (INDEPENDENT_AMBULATORY_CARE_PROVIDER_SITE_OTHER): Payer: Self-pay | Admitting: Specialist

## 2015-08-02 ENCOUNTER — Encounter (INDEPENDENT_AMBULATORY_CARE_PROVIDER_SITE_OTHER): Payer: Medicare Other | Admitting: Specialist

## 2015-08-02 NOTE — Telephone Encounter (Signed)
Review and erx

## 2015-08-05 ENCOUNTER — Other Ambulatory Visit (HOSPITAL_BASED_OUTPATIENT_CLINIC_OR_DEPARTMENT_OTHER): Payer: Self-pay

## 2015-08-06 ENCOUNTER — Ambulatory Visit
Admission: RE | Admit: 2015-08-06 | Discharge: 2015-08-06 | Disposition: A | Discharge status: Home / Self Care | Payer: Medicare Other | Source: Ambulatory Visit | Attending: Family Medicine | Admitting: Family Medicine

## 2015-08-06 ENCOUNTER — Telehealth (INDEPENDENT_AMBULATORY_CARE_PROVIDER_SITE_OTHER): Payer: Self-pay | Admitting: Family Medicine

## 2015-08-06 DIAGNOSIS — R1084 Generalized abdominal pain: Secondary | ICD-10-CM

## 2015-08-06 DIAGNOSIS — R109 Unspecified abdominal pain: Secondary | ICD-10-CM | POA: Insufficient documentation

## 2015-08-06 NOTE — Telephone Encounter (Signed)
Patient notified of U/S results.  Eulas Post, [place]  08/06/2015, 13:44

## 2015-08-06 NOTE — Telephone Encounter (Signed)
Pelvic ultrasound was normal.  Left ovary was not visualized but this is not abnormal.

## 2015-08-16 ENCOUNTER — Encounter (INDEPENDENT_AMBULATORY_CARE_PROVIDER_SITE_OTHER): Payer: Medicare Other | Admitting: Specialist

## 2015-08-20 ENCOUNTER — Ambulatory Visit: Payer: Medicare Other | Attending: Orthopaedic Surgery

## 2015-08-20 DIAGNOSIS — K2960 Other gastritis without bleeding: Secondary | ICD-10-CM | POA: Insufficient documentation

## 2015-08-20 DIAGNOSIS — T39391A Poisoning by other nonsteroidal anti-inflammatory drugs [NSAID], accidental (unintentional), initial encounter: Secondary | ICD-10-CM | POA: Insufficient documentation

## 2015-08-20 DIAGNOSIS — Z791 Long term (current) use of non-steroidal anti-inflammatories (NSAID): Secondary | ICD-10-CM | POA: Insufficient documentation

## 2015-08-20 DIAGNOSIS — T39395A Adverse effect of other nonsteroidal anti-inflammatory drugs [NSAID], initial encounter: Secondary | ICD-10-CM

## 2015-08-20 LAB — COMPREHENSIVE METABOLIC PROFILE - BMC/JMC ONLY
ALBUMIN/GLOBULIN RATIO: 1.6 (ref 0.8–2.0)
ALBUMIN: 4.1 g/dL (ref 3.5–5.0)
ALKALINE PHOSPHATASE: 37 U/L — ABNORMAL LOW (ref 38–126)
ALT (SGPT): 24 U/L (ref 14–54)
ANION GAP: 6 mmol/L (ref 3–11)
AST (SGOT): 29 U/L (ref 15–41)
BILIRUBIN TOTAL: 0.4 mg/dL (ref 0.3–1.2)
BUN/CREA RATIO: 28 — ABNORMAL HIGH (ref 6–22)
BUN: 16 mg/dL (ref 6–20)
CALCIUM: 9.2 mg/dL (ref 8.8–10.2)
CHLORIDE: 107 mmol/L (ref 101–111)
CO2 TOTAL: 28 mmol/L (ref 22–32)
CREATININE: 0.58 mg/dL (ref 0.44–1.00)
ESTIMATED GFR: >60 mL/min/1.73mˆ2 (ref >60)
GLUCOSE: 91 mg/dL (ref 70–110)
POTASSIUM: 4.2 mmol/L (ref 3.4–5.1)
PROTEIN TOTAL: 6.6 g/dL (ref 6.4–8.3)
SODIUM: 141 mmol/L (ref 136–145)

## 2015-08-20 LAB — CBC WITH DIFF
BASOPHIL #: 0 x10ˆ3/uL (ref 0.00–0.10)
BASOPHIL %: 1 % (ref 0–3)
EOSINOPHIL #: 0.1 x10ˆ3/uL (ref 0.00–0.50)
EOSINOPHIL %: 3 % (ref 0–5)
HCT: 38.5 % (ref 36.0–45.0)
HGB: 12.6 g/dL (ref 12.0–15.5)
LYMPHOCYTE #: 1 x10?3/uL (ref 1.00–4.80)
LYMPHOCYTE %: 26 % (ref 15–43)
MCH: 31 pg (ref 27.5–33.2)
MCHC: 32.6 g/dL (ref 32.0–36.0)
MCV: 94.9 fL (ref 82.0–97.0)
MONOCYTE #: 0.3 x10ˆ3/uL (ref 0.20–0.90)
MONOCYTE %: 9 % (ref 5–12)
MPV: 9.7 fL (ref 7.4–10.5)
NEUTROPHIL #: 2.4 x10ˆ3/uL (ref 1.50–6.50)
NEUTROPHIL %: 62 % (ref 43–76)
PLATELETS: 142 x10?3/uL — ABNORMAL LOW (ref 150–450)
RBC: 4.05 x10ˆ6/uL (ref 4.00–5.10)
RDW: 13.4 % (ref 11.0–16.0)
RDW: 13.4 % (ref 11.0–16.0)
WBC: 3.9 10*3/uL — ABNORMAL LOW (ref 4.0–11.0)
WBC: 3.9 x10?3/uL — ABNORMAL LOW (ref 4.0–11.0)

## 2015-08-23 ENCOUNTER — Encounter (INDEPENDENT_AMBULATORY_CARE_PROVIDER_SITE_OTHER): Payer: Medicare Other | Admitting: Family Medicine

## 2015-08-31 ENCOUNTER — Telehealth (INDEPENDENT_AMBULATORY_CARE_PROVIDER_SITE_OTHER): Payer: Self-pay | Admitting: Specialist

## 2015-08-31 ENCOUNTER — Encounter (INDEPENDENT_AMBULATORY_CARE_PROVIDER_SITE_OTHER): Payer: Self-pay

## 2015-08-31 ENCOUNTER — Encounter (INDEPENDENT_AMBULATORY_CARE_PROVIDER_SITE_OTHER): Payer: Self-pay | Admitting: Specialist

## 2015-08-31 MED ORDER — RIZATRIPTAN 10 MG TABLET
ORAL_TABLET | ORAL | 5 refills | Status: DC
Start: 2015-08-31 — End: 2015-09-16

## 2015-08-31 NOTE — Telephone Encounter (Signed)
           Dr. Shon Baton,   My insurance no longer pays for my migraine medicine (Zomig). Can you send a different prescription to my pharmacy [facility], [place], [place] )? I don't have another appointment with you until July.   Thanks.   [name]

## 2015-08-31 NOTE — Telephone Encounter (Signed)
-----   Message from [place]. Monical sent at 08/31/2015  8:57 AM EDT -----  Regarding: Prescription Question  Contact: [phone]  Dr. Shon Baton,  My insurance no longer pays for my migraine medicine (Zomig). Can you send a different prescription to my pharmacy [facility], [place], [place] )? I don't have another appointment with you until July.  Thanks.  [name]

## 2015-08-31 NOTE — Telephone Encounter (Signed)
Sent in for maxalt

## 2015-09-16 ENCOUNTER — Encounter (INDEPENDENT_AMBULATORY_CARE_PROVIDER_SITE_OTHER): Payer: Self-pay | Admitting: Family Medicine

## 2015-09-16 ENCOUNTER — Ambulatory Visit (INDEPENDENT_AMBULATORY_CARE_PROVIDER_SITE_OTHER): Payer: Medicare Other | Admitting: Family Medicine

## 2015-09-16 VITALS — BP 120/62 | HR 58 | Temp 97.7°F | Resp 12 | Wt 183.8 lb

## 2015-09-16 DIAGNOSIS — G43909 Migraine, unspecified, not intractable, without status migrainosus: Secondary | ICD-10-CM

## 2015-09-16 DIAGNOSIS — F419 Anxiety disorder, unspecified: Secondary | ICD-10-CM

## 2015-09-16 DIAGNOSIS — Z6828 Body mass index (BMI) 28.0-28.9, adult: Secondary | ICD-10-CM

## 2015-09-16 DIAGNOSIS — M8580 Other specified disorders of bone density and structure, unspecified site: Secondary | ICD-10-CM

## 2015-09-16 DIAGNOSIS — Z91038 Other insect allergy status: Secondary | ICD-10-CM

## 2015-09-16 DIAGNOSIS — Z91030 Bee allergy status: Secondary | ICD-10-CM

## 2015-09-16 MED ORDER — EPINEPHRINE 0.3 MG/0.3 ML INJECTION, AUTO-INJECTOR
0.30 mg | AUTO-INJECTOR | Freq: Once | INTRAMUSCULAR | 2 refills | Status: DC | PRN
Start: 2015-09-16 — End: 2016-10-11

## 2015-09-16 MED ORDER — ALENDRONATE 35 MG TABLET
35.00 mg | ORAL_TABLET | ORAL | 5 refills | Status: DC
Start: 2015-09-16 — End: 2015-11-10

## 2015-09-16 MED ORDER — ELETRIPTAN 20 MG TABLET
20.00 mg | ORAL_TABLET | Freq: Once | ORAL | 3 refills | Status: DC | PRN
Start: 2015-09-16 — End: 2015-12-20

## 2015-09-16 MED ORDER — LORAZEPAM 0.5 MG TABLET
0.50 mg | ORAL_TABLET | Freq: Two times a day (BID) | ORAL | 1 refills | Status: DC | PRN
Start: 2015-09-16 — End: 2016-01-20

## 2015-09-16 NOTE — Progress Notes (Signed)
BP 120/62   Pulse 58   Temp 36.5 C (97.7 F) (Oral)    Resp 12   Wt 83.4 kg (183 lb 12.8 oz)   BMI 28.79 kg/m2  Eulas Post, [place]  09/16/2015, 15:06

## 2015-09-26 NOTE — Progress Notes (Signed)
Outpatient Visit Note (Assment/Plan;Subjective;Objective)     Patient: [name] Provider: [name], MD   Date of Birth: [birth year] Date of Visit 09/16/2015     Assessment: Plan:       ICD-10-CM    1. Anxiety  Xanax not effective at 0.5 mg, switch to ativan F41.9 LORazepam (ATIVAN) 0.5 mg Oral Tablet  Reviewed risk of sedation and addiction   2. Osteopenia  Due to fmhx of osteoporosis advise bisphosphonate  M85.80 Alendronate (FOSAMAX) 35 mg Oral Tablet   3. Bee sting allergy  Refill sent Z91.038 EPINEPHrine (EPIPEN) 0.3 mg/0.3 mL Injection Auto-Injector   4. Migraine without status migrainosus, not intractable, unspecified migraine type  Due to stress, more frequent, trial of relpax G43.909 eletriptan (RELPAX) 20 mg Oral Tablet     Problem List Items Addressed This Visit     None      Visit Diagnoses     Anxiety    -  Primary    Relevant Medications    LORazepam (ATIVAN) 0.5 mg Oral Tablet    Osteopenia        Relevant Medications    Alendronate (FOSAMAX) 35 mg Oral Tablet    Bee sting allergy        Relevant Medications    EPINEPHrine (EPIPEN) 0.3 mg/0.3 mL Injection Auto-Injector    Migraine without status migrainosus, not intractable, unspecified migraine type        Relevant Medications    eletriptan (RELPAX) 20 mg Oral Tablet        Orders Placed This Encounter    Alendronate (FOSAMAX) 35 mg Oral Tablet    LORazepam (ATIVAN) 0.5 mg Oral Tablet    EPINEPHrine (EPIPEN) 0.3 mg/0.3 mL Injection Auto-Injector    eletriptan (RELPAX) 20 mg Oral Tablet     Current Outpatient Prescriptions   Medication Sig    Alendronate (FOSAMAX) 35 mg Oral Tablet Take 1 Tab (35 mg total) by mouth Every 7 days    eletriptan (RELPAX) 20 mg Oral Tablet Take 1 Tab (20 mg total) by mouth Once, as needed for Migraine for up to 1 dose may repeat in 2 hours if necessary    EPINEPHrine (EPIPEN) 0.3 mg/0.3 mL Injection Auto-Injector 0.3 mL (0.3 mg total) by Intramuscular route Once, as needed for up to 1 dose     Glucosamine HCl 1,500 mg Oral Tablet Take by mouth    lidocaine (XYLOCAINE) 5 % Ointment by Apply Topically route Once per day as needed    LORazepam (ATIVAN) 0.5 mg Oral Tablet Take 1 Tab (0.5 mg total) by mouth Twice per day as needed for Anxiety    meloxicam (MOBIC) 15 mg Oral Tablet Take 1 Tab (15 mg total) by mouth Once a day     Follow up: prn    Subjective:   CC:   Chief Complaint   Patient presents with    Anxiety     [name] is a 65 y.o. female here for Anxiety  .      1. Anxiety-continues to have stress while settling parents estates and fighting with family.  Xanax did not help  2. Migraines-have been for frequent and painful, denies aura or imbalance  3. Osteopenia-mother had osteoporosis, takes calcium/vit D    Objective:   BP 120/62   Pulse 58   Temp 36.5 C (97.7 F) (Oral)    Resp 12   Wt 83.4 kg (183 lb 12.8 oz)   BMI 28.79 kg/m2 Body mass index is  28.79 kg/(m^2).    No LMP recorded. Patient is postmenopausal.  GEN: NAD  LUNGS: CTA  B, anteriorly and posteriorly  HRT: RRR, neg murmur  EXT: No edema      [name], MD  [facility]  [address]  [id number]  [place] [address]  Dept: [phone]  Dept Fax: [phone]  Loc: [phone]  Loc Fax: [phone]

## 2015-11-09 ENCOUNTER — Other Ambulatory Visit (INDEPENDENT_AMBULATORY_CARE_PROVIDER_SITE_OTHER): Payer: Self-pay | Admitting: INTERNAL MEDICINE

## 2015-11-10 ENCOUNTER — Ambulatory Visit (INDEPENDENT_AMBULATORY_CARE_PROVIDER_SITE_OTHER): Payer: Medicare Other | Admitting: Family Medicine

## 2015-11-10 ENCOUNTER — Encounter (INDEPENDENT_AMBULATORY_CARE_PROVIDER_SITE_OTHER): Payer: Self-pay | Admitting: Family Medicine

## 2015-11-10 VITALS — BP 112/68 | HR 76 | Temp 98.1°F | Resp 14 | Wt 181.4 lb

## 2015-11-10 DIAGNOSIS — M8580 Other specified disorders of bone density and structure, unspecified site: Principal | ICD-10-CM

## 2015-11-10 DIAGNOSIS — Z6828 Body mass index (BMI) 28.0-28.9, adult: Secondary | ICD-10-CM

## 2015-11-10 DIAGNOSIS — G43909 Migraine, unspecified, not intractable, without status migrainosus: Secondary | ICD-10-CM

## 2015-11-10 DIAGNOSIS — W57XXXA Bitten or stung by nonvenomous insect and other nonvenomous arthropods, initial encounter: Secondary | ICD-10-CM

## 2015-11-10 MED ORDER — BUTALBITAL-ACETAMINOPHEN-CAFFEINE 50 MG-325 MG-40 MG CAPSULE
1.00 | ORAL_CAPSULE | ORAL | 0 refills | Status: DC | PRN
Start: 2015-11-10 — End: 2016-11-01

## 2015-11-10 MED ORDER — DOXYCYCLINE HYCLATE 100 MG CAPSULE
100.00 mg | ORAL_CAPSULE | Freq: Two times a day (BID) | ORAL | 0 refills | Status: DC
Start: 2015-11-10 — End: 2015-12-09

## 2015-11-10 NOTE — Progress Notes (Signed)
Outpatient Visit Note (Assment/Plan;Subjective;Objective)     Patient: [name] Provider: [name], MD   Date of Birth: 07-07-1950 Date of Visit 11/10/2015      Assessment: Plan:       ICD-10-CM    1. Osteopenia  D/c fosamax due to GI irritation, continue twice daily calvitamin D, consider reclast after GI sxs improve,  will monitor M85.80    2. Tick bite W57.XXXA doxycycline hyclate (VIBRAMYCIN) 100 mg Oral Capsule   3. Migraine  Patient to see if another triptan brand is on formulary G43.909 Acetaminophen-Caff-Butalbital 50-325-40 mg Oral Capsule  Risks and potential side effects of the medication were discussed with the patient.  The patient was instructed to call the office with any concerns while taking the medication.       Orders Placed This Encounter    doxycycline hyclate (VIBRAMYCIN) 100 mg Oral Capsule    Acetaminophen-Caff-Butalbital 50-325-40 mg Oral Capsule     Current Outpatient Prescriptions   Medication Sig    Acetaminophen-Caff-Butalbital 50-325-40 mg Oral Capsule Take 1 Cap by mouth Every 4 hours as needed    doxycycline hyclate (VIBRAMYCIN) 100 mg Oral Capsule Take 1 Cap (100 mg total) by mouth Twice daily    eletriptan (RELPAX) 20 mg Oral Tablet Take 1 Tab (20 mg total) by mouth Once, as needed for Migraine for up to 1 dose may repeat in 2 hours if necessary    EPINEPHrine (EPIPEN) 0.3 mg/0.3 mL Injection Auto-Injector 0.3 mL (0.3 mg total) by Intramuscular route Once, as needed for up to 1 dose    esomeprazole magnesium (NEXIUM) 20 mg Oral Capsule, Delayed Release(E.C.) Take 20 mg by mouth Twice a day before meals    famotidine (PEPCID) 20 mg Oral Tablet Take 20 mg by mouth Every evening    Glucosamine HCl 1,500 mg Oral Tablet Take by mouth    lidocaine (XYLOCAINE) 5 % Ointment by Apply Topically route Once per day as needed    LORazepam (ATIVAN) 0.5 mg Oral Tablet Take 1 Tab (0.5 mg total) by mouth Twice per day as needed for Anxiety    meloxicam (MOBIC) 15 mg Oral  Tablet Take 1 Tab (15 mg total) by mouth Once a day     follow up in prn    [id number]    Subjective:   CC:   Chief Complaint   Patient presents with    Hospital Follow Up    Reflux     [name] is a 65 y.o. female here for f/u.      1. Gerd/abdominal pain-had a flare of esophageal and abdominal pain while in [place].  Had been to UC/hospital in [place].  Had a cardiac and GB work up and was diagnosed with Jerrye Bushy.  Has tried pepcid and nexium twice a day which did not help.  2. Tick bite-pulled off a tick yesterday, has been working outside, has developed some redness around the site.  3. Migraine-has had for 4 days, relpax is not covered so has not tried.  Afraid to take aleve and maxalt due to stomach condition.  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[facility])     No changes in [facility]    Past Medical History:   Diagnosis Date    Breast lump 2005  Resolved prior to biopsy    Hx of migraines     Iron deficiency anemia     LLQ abdominal pain     Pancytopenia          Past Surgical History:   Procedure Laterality Date    HX BREAST BIOPSY Bilateral     benign     HX CESAREAN SECTION  1993    HX COLONOSCOPY      HX ENDOSCOPIC SINUS SURGERY      HX TONSILLECTOMY  1955         Family History   Problem Relation Age of Onset    Congestive Heart Failure Mother     Colon Cancer Mother     Asthma Mother     Diabetes Father     Colon Cancer Father     Asthma Sister     Asthma Brother     No Known Problems Daughter     No Known Problems Maternal Grandmother     No Known Problems Maternal Grandfather     No Known Problems Paternal Grandmother     No Known Problems Paternal Grandfather     No Known Problems Son     No Known Problems Maternal Aunt     No Known Problems Maternal Uncle     No Known Problems Paternal Aunt     No Known Problems  Paternal Uncle     No Known Problems Other     Breast Cancer Neg Hx     Cervical Cancer Neg Hx     Liver Cancer Neg Hx     Lung Cancer Neg Hx     Lymphoma Neg Hx     Melanoma Neg Hx     Ovarian Cancer Neg Hx     Pancreatic Cancer Neg Hx     Thyroid Cancer Neg Hx     Uterine Cancer Neg Hx     Cancer Neg Hx     Prostate Cancer Neg Hx     Leukemia Neg Hx          Social History     Social History    Marital status: Married     Spouse name: N/A    Number of children: N/A    Years of education: N/A     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   Outpatient Medications Prior to Visit   Medication Sig Dispense Refill    eletriptan (RELPAX) 20 mg Oral Tablet Take 1 Tab (20 mg total) by mouth Once, as needed for Migraine for up to 1 dose may repeat in 2 hours if necessary 9 Tab 3    EPINEPHrine (EPIPEN) 0.3 mg/0.3 mL Injection Auto-Injector 0.3 mL (0.3 mg total) by Intramuscular route Once, as needed for up to 1  dose 2 Each 2    Glucosamine HCl 1,500 mg Oral Tablet Take by mouth      lidocaine (XYLOCAINE) 5 % Ointment by Apply Topically route Once per day as needed      LORazepam (ATIVAN) 0.5 mg Oral Tablet Take 1 Tab (0.5 mg total) by mouth Twice per day as needed for Anxiety 30 Tab 1    meloxicam (MOBIC) 15 mg Oral Tablet Take 1 Tab (15 mg total) by mouth Once a day 30 Tab 3    Alendronate (FOSAMAX) 35 mg Oral Tablet Take 1 Tab (35 mg total) by mouth Every 7 days 4 Tab 5     No  facility-administered medications prior to visit.      Objective:     BP 112/68   Pulse 76   Temp 36.7 C (98.1 F) (Oral)    Resp 14   Wt 82.3 kg (181 lb 6.4 oz)   BMI 28.41 kg/m2 Body mass index is 28.41 kg/(m^2).    No LMP recorded. Patient is postmenopausal.  GEN: NAD  LUNGS: CTA  B, anteriorly and posteriorly  HRT: RRR, neg murmur  ABD: normal bowel sounds, soft, mild LUQ tenderness, nondistended, no hepatosplenomegaly  EXT: No edema    Labs:   No visits with results within 1 Month(s) from this visit.  Latest known visit with results is:    Appointment on 08/20/2015   Component Date Value Ref Range Status    SODIUM 08/20/2015 141  136 - 145 mmol/L Final    POTASSIUM 08/20/2015 4.2  3.4 - 5.1 mmol/L Final    CHLORIDE 08/20/2015 107  101 - 111 mmol/L Final    CO2 TOTAL 08/20/2015 28  22 - 32 mmol/L Final    ANION GAP 08/20/2015 6  3 - 11 mmol/L Final    BUN 08/20/2015 16  6 - 20 mg/dL Final    CREATININE 08/20/2015 0.58  0.44 - 1.00 mg/dL Final    BUN/CREA RATIO 08/20/2015 28* 6 - 22 Final    ESTIMATED GFR 08/20/2015 >60  >60 mL/min/1.57m2 Final    ALBUMIN 08/20/2015 4.1  3.5 - 5.0 g/dL Final    CALCIUM 08/20/2015 9.2  8.8 - 10.2 mg/dL Final    GLUCOSE 08/20/2015 91  70 - 110 mg/dL Final    ALKALINE PHOSPHATASE 08/20/2015 37* 38 - 126 U/L Final    ALT (SGPT) 08/20/2015 24  14 - 54 U/L Final    AST (SGOT) 08/20/2015 29  15 - 41 U/L Final    BILIRUBIN TOTAL 08/20/2015 0.4  0.3 - 1.2 mg/dL Final    PROTEIN TOTAL 08/20/2015 6.6  6.4 - 8.3 g/dL Final    ALBUMIN/GLOBULIN RATIO 08/20/2015 1.6  0.8 - 2.0 Final    WBC 08/20/2015 3.9* 4.0 - 11.0 x103/uL Final    RBC 08/20/2015 4.05  4.00 - 5.10 x106/uL Final    HGB 08/20/2015 12.6  12.0 - 15.5 g/dL Final    HCT 08/20/2015 38.5  36.0 - 45.0 % Final    MCV 08/20/2015 94.9  82.0 - 97.0 fL Final    MCH 08/20/2015 31.0  27.5 - 33.2 pg Final    MCHC 08/20/2015 32.6  32.0 - 36.0 g/dL Final    RDW 08/20/2015 13.4  11.0 - 16.0 % Final    PLATELETS  08/20/2015 142* 150 - 450 x103/uL Final    MPV 08/20/2015 9.7  7.4 - 10.5 fL Final    NEUTROPHIL % 08/20/2015 62  43 - 76 % Final  LYMPHOCYTE % 08/20/2015 26  15 - 43 % Final    MONOCYTE % 08/20/2015 9  5 - 12 % Final    EOSINOPHIL % 08/20/2015 3  0 - 5 % Final    BASOPHIL % 08/20/2015 1  0 - 3 % Final    NEUTROPHIL # 08/20/2015 2.40  1.50 - 6.50 x103/uL Final    LYMPHOCYTE # 08/20/2015 1.00  1.00 - 4.80 x103/uL Final    MONOCYTE # 08/20/2015 0.30  0.20 - 0.90 x103/uL Final    EOSINOPHIL # 08/20/2015 0.10  0.00 - 0.50 x103/uL Final    BASOPHIL # 08/20/2015 0.00  0.00 - 0.10 x103/uL Final         [name], MD  [facility]  [address]  [id number]  [place] [address]  Dept: [phone]  Dept Fax: [phone]  Loc: [phone]  Loc Fax: [phone]

## 2015-11-10 NOTE — Progress Notes (Signed)
BP 112/68   Pulse 76   Temp 36.7 C (98.1 F) (Oral)    Resp 14   Wt 82.3 kg (181 lb 6.4 oz)   BMI 28.41 kg/m2  Eulas Post, [place]  11/10/2015, 08:45

## 2015-11-13 ENCOUNTER — Ambulatory Visit (INDEPENDENT_AMBULATORY_CARE_PROVIDER_SITE_OTHER): Payer: Medicare Other | Admitting: Physician Assistant

## 2015-11-13 ENCOUNTER — Encounter (INDEPENDENT_AMBULATORY_CARE_PROVIDER_SITE_OTHER): Payer: Self-pay | Admitting: Physician Assistant

## 2015-11-13 VITALS — BP 112/62 | HR 89 | Temp 98.3°F | Ht 67.5 in | Wt 182.0 lb

## 2015-11-13 DIAGNOSIS — Z6828 Body mass index (BMI) 28.0-28.9, adult: Secondary | ICD-10-CM

## 2015-11-13 DIAGNOSIS — S80262A Insect bite (nonvenomous), left knee, initial encounter: Secondary | ICD-10-CM

## 2015-11-13 DIAGNOSIS — W57XXXA Bitten or stung by nonvenomous insect and other nonvenomous arthropods, initial encounter: Secondary | ICD-10-CM

## 2015-11-13 MED ORDER — CEPHALEXIN 500 MG CAPSULE
500.00 mg | ORAL_CAPSULE | Freq: Two times a day (BID) | ORAL | 0 refills | Status: AC
Start: 2015-11-13 — End: 2015-11-23

## 2015-11-13 NOTE — Progress Notes (Signed)
BP 112/62   Pulse 89   Temp 36.8 C (98.3 F) (Oral)    Ht 1.715 m (5' 7.5")   Wt 82.6 kg (182 lb)   SpO2 98%   BMI 28.08 kg/m2  [name]  11/13/2015, 18:55

## 2015-11-13 NOTE — Patient Instructions (Signed)
[facility]  [address], [place]  [place], [place] [address]  Phone: [phone]  Fax: [phone]             Open Daily 8:00am - 8:00pm, except Sundays 12pm-8pm         ~ Closed Thanksgiving and Christmas Day     Attending Caregiver: Mariella Saa, PA-C      Today's orders: No orders of the defined types were placed in this encounter.       Prescription(s) E-Rx to:  [facility], [address].    ________________________________________________________________________  Short Term Disability and [facility] does NOT provide assistance with any disability applications.  If you feel your medical condition requires you to be on disability, you will need to follow up with  Your primary care physician or a specialist.  We apologize for any inconvenience.    For Medication Prescribed by [facility]:  As an Urgent Care facility, our clinic does NOT offer prescription refills over the telephone.    If you need more of the medication one of our medical providers prescribed, you will  Either need to be re-evaluated by [place] or see your primary care physician.    ________________________________________________________________________      It is very important that we have a phone number that is the single best way to contact you in the event that we become aware of important clinical information or concerns after your discharge.  If the phone number you provided at registration is NOT this number you should inform staff and registration prior to leaving.      Your treatment and evaluation today was focused on identifying and treating potentially emergent conditions based on your presenting signs, symptoms, and history.  The resulting initial clinical impression and treatment plan is not intended to be definitive or a substitute for a full physical examination and evaluation by your primary care provider.  If your symptoms  persist, worsen, or you develop any new or concerning symptoms, you need to be evaluated.      If you received x-rays during your visit, be aware that the final and formal interpretation of those films by a radiologist may occur after your discharge.  If there is a significant discrepancy identified after your discharge, we will contact you at the telephone number provided at registration.      If you received a pelvic exam, you may have cultures pending for sexually transmitted diseases.  Positive cultures are reported to the [place] Department of Health as required by state law.  You should be contacted if you cultures are positive.  We will not contact you if they are negative.  You did NOT receive a PAP smear (the screening test for cervical).  This specific test for women is best performed by your gynecologist or primary care provider when indicated.      If you are over 77 year old, we cannot discuss your personal health information with a parent, spouse, family member, or anyone else without your express consent.  This does not include those who have legitimate access to your records and information to assist in your care under the provisions of HIPAA ([place] and [place]) law, or those to whom you have previously given express written consent to do so, such a legal guardian or Power of [place].      You may have received  medication that may cause you to feel drowsy and/or light headed for several hours.  You may even experience some amnesia of your stay.  You should avoid operating a motor vehicle or performing any activity requiring complete alertness or coordination until you feel fully awake (approximately 24-48 hours).  Avoid alcoholic beverages.  You may also have a dry mouth for several hours.  This is a normal side effect and will disappear as the effects of the medication wear off.      Instructions discussed with patient upon discharge by clinical staff with all questions  answered.  Please call [facility] ([phone]) if any further questions.  Go immediately to the emergency department if any concern or worsening symptoms.      Mariella Saa, PA-C 11/13/2015, 18:55

## 2015-11-13 NOTE — Progress Notes (Signed)
[name]  Date of Service: 11/13/2015    Chief complaint:   Chief Complaint   Patient presents with    Tick  Exposure    Rash     Subjective  [name] is a 65 y.o. female presenting to clinic today c/o rash on the left knee for the last 6 days, worsening daily despite starting doxycycline 5 days ago. The rash is painful, not itchy, no bleeding or discharge. It is worse with time, better with nothing. No measured fever, but chills, nausea, and general malaise for the past 3 days.    ROS: no measured fever, denies headaches, dizziness, chest pain or shortness of breath, denies black or bloody stools, weakness or difficulty with ambulation, +skin rash or lesion.        Allergies   Allergen Reactions    Percocet [Oxycodone-Acetaminophen] Nausea/ Vomiting    Beesting [Hymenoptera Allergenic Extract]      Patient Active Problem List   Diagnosis    Pancytopenia    Left lower quadrant pain    Iron deficiency    Fatigue    Dyspnea    MTHFR mutation (HCC)     Family History   Problem Relation Age of Onset    Congestive Heart Failure Mother     Colon Cancer Mother     Asthma Mother     Diabetes Father     Colon Cancer Father     Asthma Sister     Asthma Brother     No Known Problems Daughter     No Known Problems Maternal Grandmother     No Known Problems Maternal Grandfather     No Known Problems Paternal Grandmother     No Known Problems Paternal Grandfather     No Known Problems Son     No Known Problems Maternal Aunt     No Known Problems Maternal Uncle     No Known Problems Paternal Aunt     No Known Problems Paternal Uncle     No Known Problems Other     Breast Cancer Neg Hx     Cervical Cancer Neg Hx     Liver Cancer Neg Hx     Lung Cancer Neg Hx     Lymphoma Neg Hx     Melanoma Neg Hx     Ovarian Cancer Neg Hx     Pancreatic Cancer Neg Hx     Thyroid Cancer Neg Hx     Uterine Cancer Neg Hx     Cancer Neg Hx     Prostate Cancer Neg Hx     Leukemia Neg Hx             Outpatient Medications Prior to Visit:  Acetaminophen-Caff-Butalbital 50-325-40 mg Oral Capsule Take 1 Cap by mouth Every 4 hours as needed   doxycycline hyclate (VIBRAMYCIN) 100 mg Oral Capsule Take 1 Cap (100 mg total) by mouth Twice daily   eletriptan (RELPAX) 20 mg Oral Tablet Take 1 Tab (20 mg total) by mouth Once, as needed for Migraine for up to 1 dose may repeat in 2 hours if necessary   EPINEPHrine (EPIPEN) 0.3 mg/0.3 mL Injection Auto-Injector 0.3 mL (0.3 mg total) by Intramuscular route Once, as needed for up to 1 dose   esomeprazole magnesium (NEXIUM) 20 mg Oral Capsule, Delayed Release(E.C.) Take 20 mg by mouth Twice a day before meals   famotidine (PEPCID) 20 mg Oral Tablet Take 20 mg by mouth Every evening  Glucosamine HCl 1,500 mg Oral Tablet Take by mouth   lidocaine (XYLOCAINE) 5 % Ointment by Apply Topically route Once per day as needed   LORazepam (ATIVAN) 0.5 mg Oral Tablet Take 1 Tab (0.5 mg total) by mouth Twice per day as needed for Anxiety (Patient not taking: Reported on 11/13/2015)   meloxicam (MOBIC) 15 mg Oral Tablet Take 1 Tab (15 mg total) by mouth Once a day (Patient not taking: Reported on 11/13/2015)     No facility-administered medications prior to visit.     Objective  Vitals: BP 112/62   Pulse 89   Temp 36.8 C (98.3 F) (Oral)    Ht 1.715 m (5' 7.5")   Wt 82.6 kg (182 lb)   SpO2 98%   BMI 28.08 kg/m2  General: no distress  Eyes: Conjunctiva clear., Pupils equal and round.   Lungs: clear to auscultation bilaterally.   Vascular: capillary refill <3 seconds  Extremities: no cyanosis  Skin: nonblanchanble erythema and ecchymotic/petechial lesion 10-15 cm in diameter with central induration and punctum from tick bite at the medial aspect of the right knee.        Assessment/Plan  1. Tick bite        Orders Placed This Encounter    cephalexin (KEFLEX) 500 mg Oral Capsule     Adding keflex to antibiotic regimen, and have recommended that patient follow up with PCP asap for  further evaluation of symptoms. Yogurt or probiotics recommended.  Pt verbalized understanding and agrees with the plan of care. Call or return to clinic prn if these symptoms worsen or fail to improve as anticipated.    Mariella Saa, PA-C  11/13/2015, 18:55    My supervising physician today is Dr. Janith Lima.    I discussed management with the PA before the pt left. I reviewed the  note and agree with the documented findings and plan of care .    Burnett Harry, MD

## 2015-11-14 ENCOUNTER — Encounter (INDEPENDENT_AMBULATORY_CARE_PROVIDER_SITE_OTHER): Payer: Self-pay | Admitting: Physician Assistant

## 2015-11-14 NOTE — Progress Notes (Signed)
Pt seen in urgent care yesterday. Called today to follow up but no answer. Left a message for  pt to call back if there are any questions or concerns.

## 2015-11-15 ENCOUNTER — Ambulatory Visit (INDEPENDENT_AMBULATORY_CARE_PROVIDER_SITE_OTHER): Payer: Medicare Other | Admitting: Family Medicine

## 2015-11-15 ENCOUNTER — Encounter (INDEPENDENT_AMBULATORY_CARE_PROVIDER_SITE_OTHER): Payer: Self-pay | Admitting: Family Medicine

## 2015-11-15 VITALS — BP 110/70 | HR 60 | Temp 97.4°F | Resp 12 | Wt 182.0 lb

## 2015-11-15 DIAGNOSIS — Z6828 Body mass index (BMI) 28.0-28.9, adult: Secondary | ICD-10-CM

## 2015-11-15 DIAGNOSIS — S80862A Insect bite (nonvenomous), left lower leg, initial encounter: Secondary | ICD-10-CM

## 2015-11-15 DIAGNOSIS — W57XXXA Bitten or stung by nonvenomous insect and other nonvenomous arthropods, initial encounter: Secondary | ICD-10-CM

## 2015-11-15 NOTE — Progress Notes (Signed)
BP 110/70   Pulse 60   Temp 36.3 C (97.4 F) (Oral)    Resp 12   Wt 82.6 kg (182 lb)   BMI 28.08 kg/m2  Eulas Post, [place]  11/15/2015, 09:28

## 2015-11-15 NOTE — Progress Notes (Signed)
Outpatient Visit Note (Assment/Plan;Subjective;Objective)     Patient: [name] Provider: [name], MD   Date of Birth: 08-Feb-1951 Date of Visit 11/15/2015     Assessment: Plan:       ICD-10-CM    1. Tick bite  No signs of active cellulitis, advise patient to finish antibiotics and try claritin for itch, monitor W57.XXXA      Problem List Items Addressed This Visit     None      Visit Diagnoses     Tick bite    -  Primary        No orders of the defined types were placed in this encounter.    Current Outpatient Prescriptions   Medication Sig    Acetaminophen-Caff-Butalbital 50-325-40 mg Oral Capsule Take 1 Cap by mouth Every 4 hours as needed    cephalexin (KEFLEX) 500 mg Oral Capsule Take 1 Cap (500 mg total) by mouth Twice daily for 10 days    doxycycline hyclate (VIBRAMYCIN) 100 mg Oral Capsule Take 1 Cap (100 mg total) by mouth Twice daily    eletriptan (RELPAX) 20 mg Oral Tablet Take 1 Tab (20 mg total) by mouth Once, as needed for Migraine for up to 1 dose may repeat in 2 hours if necessary    EPINEPHrine (EPIPEN) 0.3 mg/0.3 mL Injection Auto-Injector 0.3 mL (0.3 mg total) by Intramuscular route Once, as needed for up to 1 dose    esomeprazole magnesium (NEXIUM) 20 mg Oral Capsule, Delayed Release(E.C.) Take 20 mg by mouth Twice a day before meals    famotidine (PEPCID) 20 mg Oral Tablet Take 20 mg by mouth Every evening    Glucosamine HCl 1,500 mg Oral Tablet Take by mouth    lidocaine (XYLOCAINE) 5 % Ointment by Apply Topically route Once per day as needed    LORazepam (ATIVAN) 0.5 mg Oral Tablet Take 1 Tab (0.5 mg total) by mouth Twice per day as needed for Anxiety (Patient not taking: Reported on 11/13/2015)    meloxicam (MOBIC) 15 mg Oral Tablet Take 1 Tab (15 mg total) by mouth Once a day (Patient not taking: Reported on 11/13/2015)     Follow up: prn    Subjective:   CC:   Chief Complaint   Patient presents with    Insect Bite     [name] is a 65 y.o. female here  for Insect Bite  .      1. Tick bite-patient started doxycycline but developed a bumpy rash over the weekend and was started on keflex as well.  Continues to be a little itchy but rash is clearing.  Has had some mild joint pain.  Denies fever.    Objective:   BP 110/70   Pulse 60   Temp 36.3 C (97.4 F) (Oral)    Resp 12   Wt 82.6 kg (182 lb)   BMI 28.08 kg/m2 Body mass index is 28.08 kg/(m^2).    No LMP recorded. Patient is postmenopausal.  GEN: NAD  LUNGS: CTA  B, anteriorly and posteriorly  HRT: RRR, neg murmur  EXT: No edema, left inner leg with petechial rash with clearing, no warmth or induration      [name], MD  [facility]  [address]  [id number]  [place] [address]  Dept: [phone]  Dept Fax: [phone]  Loc: [phone]  Loc Fax: [phone]

## 2015-11-26 ENCOUNTER — Other Ambulatory Visit (INDEPENDENT_AMBULATORY_CARE_PROVIDER_SITE_OTHER): Payer: Self-pay | Admitting: Family Medicine

## 2015-11-27 ENCOUNTER — Other Ambulatory Visit (INDEPENDENT_AMBULATORY_CARE_PROVIDER_SITE_OTHER): Payer: Self-pay | Admitting: Family Medicine

## 2015-11-29 NOTE — Telephone Encounter (Signed)
Meloxicam refill request, already refilled 11/26/15.  Marga Hoots, LPN  [id number], [id number]

## 2015-12-09 ENCOUNTER — Ambulatory Visit: Payer: Medicare Other | Attending: Family Medicine

## 2015-12-09 ENCOUNTER — Ambulatory Visit (INDEPENDENT_AMBULATORY_CARE_PROVIDER_SITE_OTHER): Payer: Medicare Other | Admitting: Family Medicine

## 2015-12-09 ENCOUNTER — Encounter (INDEPENDENT_AMBULATORY_CARE_PROVIDER_SITE_OTHER): Payer: Self-pay | Admitting: Family Medicine

## 2015-12-09 ENCOUNTER — Telehealth (INDEPENDENT_AMBULATORY_CARE_PROVIDER_SITE_OTHER): Payer: Self-pay | Admitting: Family Medicine

## 2015-12-09 VITALS — BP 120/81 | HR 60 | Temp 97.3°F | Resp 12 | Wt 182.0 lb

## 2015-12-09 DIAGNOSIS — R350 Frequency of micturition: Secondary | ICD-10-CM

## 2015-12-09 DIAGNOSIS — K219 Gastro-esophageal reflux disease without esophagitis: Principal | ICD-10-CM

## 2015-12-09 DIAGNOSIS — Z6828 Body mass index (BMI) 28.0-28.9, adult: Secondary | ICD-10-CM

## 2015-12-09 LAB — URINALYSIS, MACROSCOPIC
BILIRUBIN: NEGATIVE mg/dL
BLOOD: NEGATIVE mg/dL
GLUCOSE: NEGATIVE mg/dL
KETONES: NEGATIVE mg/dL
LEUKOCYTES: NEGATIVE WBCs/uL
NITRITE: NEGATIVE
PH: 7 (ref <8.0)
PROTEIN: NEGATIVE mg/dL
SPECIFIC GRAVITY: 1.006 (ref <1.022)
UROBILINOGEN: <2 mg/dL (ref <=2.0)

## 2015-12-09 MED ORDER — RANITIDINE 300 MG TABLET
300.00 mg | ORAL_TABLET | Freq: Every evening | ORAL | 5 refills | Status: DC
Start: 2015-12-09 — End: 2016-01-20

## 2015-12-09 MED ORDER — ESOMEPRAZOLE MAGNESIUM 40 MG CAPSULE,DELAYED RELEASE
40.00 mg | DELAYED_RELEASE_CAPSULE | Freq: Every morning | ORAL | 2 refills | Status: DC
Start: 2015-12-09 — End: 2016-04-12

## 2015-12-09 NOTE — Nursing Note (Signed)
BP 120/81   Pulse 60   Temp 36.3 C (97.3 F) (Oral)    Resp 12   Wt 82.6 kg (182 lb)   BMI 28.08 kg/m2  Eulas Post, CMA  12/09/2015, 08:27

## 2015-12-09 NOTE — Telephone Encounter (Signed)
Urinalysis is normal

## 2015-12-09 NOTE — Telephone Encounter (Signed)
Patient notified of normal results.  Eulas Post, CMA  12/09/2015, 14:52

## 2015-12-09 NOTE — Progress Notes (Signed)
Outpatient Visit Note (Assment/Plan;Subjective;Objective)     Patient: [name] Provider: [name], MD   Date of Birth: 1951/03/13 Date of Visit 12/09/2015      Assessment: Plan:       ICD-10-CM    1. Gastroesophageal reflux disease, esophagitis presence not specified  Advised patient to stop aleve, meloxicam and to take nexium 40 mg each morning and ranitidine 300mg  at bedtime K21.9 esomeprazole magnesium (NEXIUM) 40 mg Oral Capsule, Delayed Release(E.C.)     [facility] to [facility] - Dr. Myrla Halsted   2. Urinary frequency  Patient unable to give a sample, will place outpatient order R35.0 URINALYSIS WITH REFLEX TO CULTURE IF INDICATED [facility] ONLY     CANCELED: POCT Urine Dipstick       Orders Placed This Encounter    URINALYSIS WITH REFLEX TO CULTURE IF INDICATED [facility] ONLY    Refer to [facility] GI - Dr. Myrla Halsted    CANCELED: POCT Urine Dipstick    raNITIdine (ZANTAC) 300 mg Oral Tablet    esomeprazole magnesium (NEXIUM) 40 mg Oral Capsule, Delayed Release(E.C.)     Current Outpatient Prescriptions   Medication Sig    Acetaminophen-Caff-Butalbital 50-325-40 mg Oral Capsule Take 1 Cap by mouth Every 4 hours as needed    eletriptan (RELPAX) 20 mg Oral Tablet Take 1 Tab (20 mg total) by mouth Once, as needed for Migraine for up to 1 dose may repeat in 2 hours if necessary    EPINEPHrine (EPIPEN) 0.3 mg/0.3 mL Injection Auto-Injector 0.3 mL (0.3 mg total) by Intramuscular route Once, as needed for up to 1 dose    esomeprazole magnesium (NEXIUM) 40 mg Oral Capsule, Delayed Release(E.C.) Take 1 Cap (40 mg total) by mouth Every morning before breakfast    Glucosamine HCl 1,500 mg Oral Tablet Take by mouth    lidocaine (XYLOCAINE) 5 % Ointment by Apply Topically route Once per day as needed    LORazepam (ATIVAN) 0.5 mg Oral Tablet Take 1 Tab (0.5 mg total) by mouth Twice per day as needed for Anxiety (Patient not taking: Reported on 11/13/2015)    raNITIdine (ZANTAC) 300 mg Oral Tablet Take 1 Tab  (300 mg total) by mouth Every evening     follow up in prn    [id number]    Subjective:   CC:   Chief Complaint   Patient presents with    Heartburn    Urinary Frequency     [name] is a 65 y.o. female here for GERD.    1. GERD-patient states that she has persistent burning of the throat with heartburn and it has been changing her voice.  She denies abdominal pain.  She has been constipated but denies melena or rectal bleeding.  She has been taking aleve for headaches and using meloxicam that was prescribed by Dr. Hervey Ard.  2. 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[place])     No changes in [facility]    Past Medical History:   Diagnosis Date    Breast lump 2005    Resolved prior to biopsy    Hx of migraines     Iron deficiency anemia     LLQ abdominal pain     Pancytopenia  Past Surgical History:   Procedure Laterality Date    HX BREAST BIOPSY Bilateral     benign     HX CESAREAN SECTION  1993    HX COLONOSCOPY      HX ENDOSCOPIC SINUS SURGERY      HX TONSILLECTOMY  1955         Family History   Problem Relation Age of Onset    Congestive Heart Failure Mother     Colon Cancer Mother     Asthma Mother     Diabetes Father     Colon Cancer Father     Asthma Sister     Asthma Brother     No Known Problems Daughter     No Known Problems Maternal Grandmother     No Known Problems Maternal Grandfather     No Known Problems Paternal Grandmother     No Known Problems Paternal Grandfather     No Known Problems Son     No Known Problems Maternal Aunt     No Known Problems Maternal Uncle     No Known Problems Paternal Aunt     No Known Problems Paternal Uncle     No Known Problems Other     Breast Cancer Neg Hx     Cervical Cancer Neg Hx     Liver Cancer Neg Hx      Lung Cancer Neg Hx     Lymphoma Neg Hx     Melanoma Neg Hx     Ovarian Cancer Neg Hx     Pancreatic Cancer Neg Hx     Thyroid Cancer Neg Hx     Uterine Cancer Neg Hx     Cancer Neg Hx     Prostate Cancer Neg Hx     Leukemia Neg Hx          Social History     Social History    Marital status: Married     Spouse name: N/A    Number of children: N/A    Years of education: N/A     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   Outpatient Medications Prior to Visit   Medication Sig Dispense Refill    Acetaminophen-Caff-Butalbital 50-325-40 mg Oral Capsule Take 1 Cap by mouth Every 4 hours as needed 30 Cap 0    eletriptan (RELPAX) 20 mg Oral Tablet Take 1 Tab (20 mg total) by mouth Once, as needed for Migraine for up to 1 dose may repeat in 2 hours if necessary 9 Tab 3    EPINEPHrine (EPIPEN) 0.3 mg/0.3 mL Injection Auto-Injector 0.3 mL (0.3 mg total) by Intramuscular route Once, as needed for up to 1 dose 2 Each 2    Glucosamine HCl 1,500 mg Oral Tablet Take by mouth  lidocaine (XYLOCAINE) 5 % Ointment by Apply Topically route Once per day as needed      LORazepam (ATIVAN) 0.5 mg Oral Tablet Take 1 Tab (0.5 mg total) by mouth Twice per day as needed for Anxiety (Patient not taking: Reported on 11/13/2015) 30 Tab 1    doxycycline hyclate (VIBRAMYCIN) 100 mg Oral Capsule Take 1 Cap (100 mg total) by mouth Twice daily 20 Cap 0    esomeprazole magnesium (NEXIUM) 20 mg Oral Capsule, Delayed  Release(E.C.) Take 20 mg by mouth Twice a day before meals      famotidine (PEPCID) 20 mg Oral Tablet Take 20 mg by mouth Every evening      meloxicam (MOBIC) 15 mg Oral Tablet TAKE 1 TABLET BY MOUTH ONCE DAILY 30 Tab 3     No facility-administered medications prior to visit.      Objective:     BP 120/81   Pulse 60   Temp 36.3 C (97.3 F) (Oral)    Resp 12   Wt 82.6 kg (182 lb)   BMI 28.08 kg/m2 Body mass index is 28.08 kg/(m^2).    No LMP recorded. Patient is postmenopausal.  GEN: NAD  LUNGS: CTA  B, anteriorly and posteriorly  HRT: RRR, neg murmur  ABD: normal bowel sounds, soft, nontender, nondistended, no hepatosplenomegaly  EXT: No edema    Labs:   No visits with results within 1 Month(s) from this visit.  Latest known visit with results is:    Appointment on 08/20/2015   Component Date Value Ref Range Status    SODIUM 08/20/2015 141  136 - 145 mmol/L Final    POTASSIUM 08/20/2015 4.2  3.4 - 5.1 mmol/L Final    CHLORIDE 08/20/2015 107  101 - 111 mmol/L Final    CO2 TOTAL 08/20/2015 28  22 - 32 mmol/L Final    ANION GAP 08/20/2015 6  3 - 11 mmol/L Final    BUN 08/20/2015 16  6 - 20 mg/dL Final    CREATININE 08/20/2015 0.58  0.44 - 1.00 mg/dL Final    BUN/CREA RATIO 08/20/2015 28* 6 - 22 Final    ESTIMATED GFR 08/20/2015 >60  >60 mL/min/1.41m2 Final    ALBUMIN 08/20/2015 4.1  3.5 - 5.0 g/dL Final    CALCIUM 08/20/2015 9.2  8.8 - 10.2 mg/dL Final    GLUCOSE 08/20/2015 91  70 - 110 mg/dL Final    ALKALINE PHOSPHATASE 08/20/2015 37* 38 - 126 U/L Final    ALT (SGPT) 08/20/2015 24  14 - 54 U/L Final    AST (SGOT) 08/20/2015 29  15 - 41 U/L Final    BILIRUBIN TOTAL 08/20/2015 0.4  0.3 - 1.2 mg/dL Final    PROTEIN TOTAL 08/20/2015 6.6  6.4 - 8.3 g/dL Final    ALBUMIN/GLOBULIN RATIO 08/20/2015 1.6  0.8 - 2.0 Final    WBC 08/20/2015 3.9* 4.0 - 11.0 x103/uL Final    RBC 08/20/2015 4.05  4.00 - 5.10 x106/uL Final    HGB 08/20/2015 12.6  12.0 - 15.5 g/dL Final    HCT 08/20/2015 38.5  36.0 - 45.0  % Final    MCV 08/20/2015 94.9  82.0 - 97.0 fL Final    MCH 08/20/2015 31.0  27.5 - 33.2 pg Final    MCHC 08/20/2015 32.6  32.0 - 36.0 g/dL Final    RDW 08/20/2015 13.4  11.0 - 16.0 % Final    PLATELETS 08/20/2015 142* 150 - 450 x103/uL Final    MPV 08/20/2015 9.7  7.4 - 10.5 fL Final    NEUTROPHIL % 08/20/2015 62  43 - 76 % Final    LYMPHOCYTE % 08/20/2015 26  15 - 43 % Final    MONOCYTE % 08/20/2015 9  5 - 12 % Final    EOSINOPHIL % 08/20/2015 3  0 - 5 % Final    BASOPHIL % 08/20/2015 1  0 - 3 % Final    NEUTROPHIL # 08/20/2015 2.40  1.50 - 6.50 x103/uL Final    LYMPHOCYTE # 08/20/2015 1.00  1.00 - 4.80 x103/uL Final    MONOCYTE # 08/20/2015 0.30  0.20 - 0.90 x103/uL Final    EOSINOPHIL # 08/20/2015 0.10  0.00 - 0.50 x103/uL Final    BASOPHIL # 08/20/2015 0.00  0.00 - 0.10 x103/uL Final         [name], MD  [facility]  [address]  [id number]  [place] [address]  Dept: [phone]  Dept Fax: [phone]  Loc: [phone]  Loc Fax: [phone]

## 2015-12-16 ENCOUNTER — Encounter (INDEPENDENT_AMBULATORY_CARE_PROVIDER_SITE_OTHER): Payer: Medicare Other | Admitting: Family Medicine

## 2015-12-20 ENCOUNTER — Encounter (INDEPENDENT_AMBULATORY_CARE_PROVIDER_SITE_OTHER): Payer: Self-pay | Admitting: Specialist

## 2015-12-20 ENCOUNTER — Ambulatory Visit (INDEPENDENT_AMBULATORY_CARE_PROVIDER_SITE_OTHER): Payer: Medicare Other | Admitting: Specialist

## 2015-12-20 ENCOUNTER — Encounter (INDEPENDENT_AMBULATORY_CARE_PROVIDER_SITE_OTHER): Payer: Medicare Other | Admitting: Specialist

## 2015-12-20 VITALS — BP 102/64 | HR 66 | Ht 67.0 in | Wt 181.0 lb

## 2015-12-20 DIAGNOSIS — Z6828 Body mass index (BMI) 28.0-28.9, adult: Secondary | ICD-10-CM

## 2015-12-20 DIAGNOSIS — G43009 Migraine without aura, not intractable, without status migrainosus: Secondary | ICD-10-CM

## 2015-12-20 MED ORDER — TIZANIDINE 4 MG TABLET
ORAL_TABLET | ORAL | 5 refills | Status: DC
Start: 2015-12-20 — End: 2016-01-20

## 2015-12-20 MED ORDER — ZOLMITRIPTAN 5 MG TABLET
ORAL_TABLET | ORAL | 11 refills | Status: DC
Start: 2015-12-20 — End: 2016-12-25

## 2015-12-20 NOTE — Progress Notes (Signed)
RE:  [name]          01-Jan-1951    Dear [name], MD:    I had the pleasure of seeing [name] in follow-up at the [facility] on 12/20/2015.  Today she reports that her headaches have worsened (her insurance cut her Zomig and she was changed to maxalt).  She is currently taking nothing for prevention and using rizatriptan as needed for her headaches.  Severe Headaches: 12-13 per month  Response to abortive medications: Poor  Side effects to medications: none  She reports that Zomig was very effective but maxalt is not effective    Results of previous testing: n/a    Current Outpatient Prescriptions   Medication Sig Dispense Refill    Acetaminophen-Caff-Butalbital 50-325-40 mg Oral Capsule Take 1 Cap by mouth Every 4 hours as needed (Patient not taking: Reported on 12/20/2015) 30 Cap 0    EPINEPHrine (EPIPEN) 0.3 mg/0.3 mL Injection Auto-Injector 0.3 mL (0.3 mg total) by Intramuscular route Once, as needed for up to 1 dose 2 Each 2    esomeprazole magnesium (NEXIUM) 40 mg Oral Capsule, Delayed Release(E.C.) Take 1 Cap (40 mg total) by mouth Every morning before breakfast 30 Cap 2    Glucosamine HCl 1,500 mg Oral Tablet Take by mouth      lidocaine (XYLOCAINE) 5 % Ointment by Apply Topically route Once per day as needed      LORazepam (ATIVAN) 0.5 mg Oral Tablet Take 1 Tab (0.5 mg total) by mouth Twice per day as needed for Anxiety (Patient not taking: Reported on 11/13/2015) 30 Tab 1    raNITIdine (ZANTAC) 300 mg Oral Tablet Take 1 Tab (300 mg total) by mouth Every evening 30 Tab 5    tiZANidine (ZANAFLEX) 4 mg Oral Tablet 1/2 - 2 q8h prn headache 30 Tab 5    Zolmitriptan (ZOMIG) 5 mg Oral Tablet Take at onset of headache, may repeat in 2 hours if needed.  Max 2/d, max 3d/wk 9 Tab 11     No current facility-administered medications for this visit.         Past medical history, family history, and social history have been reviewed and confirmed.    Sleep:trouble staying  asleep  Mood:Euthymic    Vitals:    12/20/15 1348   BP: 102/64   Pulse: 66   Weight: 82.1 kg (181 lb)   Height: 1.702 m (5\' 7" )     Her exam reveals she is normocephalic and atraumatic.  Mental status exam shows her to be oriented with good memory, attention, knowledge, and language.  Cranial nerve exam reveals equal and reactive pupils with intact conjugate eye movements, symmetric face, and clear speech, and normal hearing to voice.  Gait is steady and coordination is intact to finger-to-nose testing.  All four extremeties have normal strength.      ICD-10-CM    1. Migraine without aura G43.009 Zolmitriptan (ZOMIG) 5 mg Oral Tablet     tiZANidine (ZANAFLEX) 4 mg Oral Tablet    She is going to retry Zomig.  It should be covered as she has tried and failed Imitrex, maxalt, and relpax.  She will use tizanidine as needed.    The patient will follow up with me in 6 months or sooner if needed.    [name], MD 12/20/2015, 14:15    Zella Richer. Shon Baton MD  Director, [facility]  Associate Professor of Neurology  [facility]

## 2016-01-18 ENCOUNTER — Encounter (INDEPENDENT_AMBULATORY_CARE_PROVIDER_SITE_OTHER): Payer: Medicare Other | Admitting: Family Medicine

## 2016-01-20 ENCOUNTER — Encounter (INDEPENDENT_AMBULATORY_CARE_PROVIDER_SITE_OTHER): Payer: Self-pay | Admitting: GASTROENTEROLOGY

## 2016-01-20 ENCOUNTER — Ambulatory Visit (INDEPENDENT_AMBULATORY_CARE_PROVIDER_SITE_OTHER): Payer: Medicare Other | Admitting: GASTROENTEROLOGY

## 2016-01-20 DIAGNOSIS — D509 Iron deficiency anemia, unspecified: Secondary | ICD-10-CM

## 2016-01-20 DIAGNOSIS — Z1159 Encounter for screening for other viral diseases: Secondary | ICD-10-CM

## 2016-01-20 DIAGNOSIS — K219 Gastro-esophageal reflux disease without esophagitis: Principal | ICD-10-CM

## 2016-01-20 NOTE — H&P (Signed)
[facility]  [place] [address]  [phone]    Encounter Date: 01/20/2016     Name: [name]  Age: 65 y.o.  DOB: Jun 11, 1950  Sex: female    Impression:  History of reflux improved on medical treatment  Up-to-date on colonoscopy was done in 2016 at [place] [facility]  History of iron deficiency anemia chronic no previous history of upper endoscopy  Recommendations:  She has 2 reasons to get an upper endoscopy mainly the history of iron deficiency anemia to evaluate for any occult gastric lesions and specifically for any celiac that may have been contributing to this as this is been a chronic problem and also the acid reflux would be a more minor reason to do the EGD even though it is better at this point she is hesitant to go ahead and schedule this she states that she is afraid of how much the insurance will not pay I did explain to her that it is unlikely that she would have neoplasia given the chronic iron deficiency anemia but I cannot assure her of this without doing an endoscopy and she probably needs a small bowel x-ray depending on the results of the endoscopy but at this point she is not ready to schedule any of this she will notify us if she changes her mind further comments pending    Chief Complaint/HPI:  This is a 65 year old female which consultation was requested for reflux this actually better she was treated medically with just Zantac and she almost canceled visit in going through her records she is iron deficient she has been treated for this but has never had an upper endoscopy she is up-to-date on her colonoscopy currently she denies any chest pain or shortness of breath no nausea vomiting no dyspepsia or dysphagia no abdominal pain no melena or hematochezia constipation or diarrhea appetite is good no unintentional weight loss  Chief Complaint   Patient presents with    Heartburn       History:  Family History   Problem  Relation Age of Onset    Congestive Heart Failure Mother     Colon Cancer Mother     Asthma Mother     Diabetes Father     Colon Cancer Father     Asthma Sister     Asthma Brother     No Known Problems Daughter     No Known Problems Maternal Grandmother     No Known Problems Maternal Grandfather     No Known Problems Paternal Grandmother     No Known Problems Paternal Grandfather     No Known Problems Son     No Known Problems Maternal Aunt     No Known Problems Maternal Uncle     No Known Problems Paternal Aunt     No Known Problems Paternal Uncle     No Known Problems Other     Breast Cancer Neg Hx     Cervical Cancer Neg Hx     Liver Cancer Neg Hx     Lung Cancer Neg Hx     Lymphoma Neg Hx     Melanoma Neg Hx     Ovarian Cancer Neg Hx     Pancreatic Cancer Neg Hx     Thyroid Cancer Neg Hx     Uterine Cancer Neg Hx     Cancer Neg Hx     Prostate Cancer Neg Hx  Leukemia Neg Hx          Past Medical History:   Diagnosis Date    Breast lump 2005    Resolved prior to biopsy    Hx of migraines     Iron deficiency anemia     LLQ abdominal pain     Pancytopenia          Past Surgical History:   Procedure Laterality Date    HX BREAST BIOPSY Bilateral     benign     HX CESAREAN SECTION  1993    HX COLONOSCOPY      HX ENDOSCOPIC SINUS SURGERY      HX TONSILLECTOMY  1955         Patient Active Problem List    Diagnosis    MTHFR mutation ([facility])    Dyspnea    Fatigue    Iron deficiency    Left lower quadrant pain    Pancytopenia     History   Smoking Status    Never Smoker   Smokeless Tobacco    Not on file     History   Alcohol Use No     History   Drug Use No     Current Outpatient Prescriptions   Medication Sig    Acetaminophen-Caff-Butalbital 50-325-40 mg Oral Capsule Take 1 Cap by mouth Every 4 hours as needed    EPINEPHrine (EPIPEN) 0.3 mg/0.3 mL Injection Auto-Injector 0.3 mL (0.3 mg total) by Intramuscular route Once, as needed for up to 1 dose    esomeprazole  magnesium (NEXIUM) 40 mg Oral Capsule, Delayed Release(E.C.) Take 1 Cap (40 mg total) by mouth Every morning before breakfast    Glucosamine HCl 1,500 mg Oral Tablet Take by mouth    lidocaine (XYLOCAINE) 5 % Ointment by Apply Topically route Once per day as needed    raNITIdine (ZANTAC) 300 mg Oral Tablet Take 300 mg by mouth Every evening    Zolmitriptan (ZOMIG) 5 mg Oral Tablet Take at onset of headache, may repeat in 2 hours if needed.  Max 2/d, max 3d/wk       Review of Systems:  Normal  HEENT:  No sore throat, no occular pain.  Respiratory:   No cough or SOB.  Cardiovascular: no chest pain or palpitation.  GI: See HPI  GU:  No S/S of dysuria, flank pain or urinary retention.  Neuro: No headaches, dizziness or weakness.  Hematologic: No fever, night sweats or chills.  Rheum: no joint pain.   Skin: no rash no purites no jaundice.  Constitutional: no unintentional weight loss.    Physical Exam  Temp 36.5 C (97.7 F)   Ht 1.702 m (5\' 7" )   Wt 83.4 kg (183 lb 12.8 oz)   BMI 28.79 kg/m2  Body mass index is 28.79 kg/(m^2).  General:  Alert and oriented, no distress.  HEENT: Unremarkable, no lymphadenopathy or JVD  Neck:  Supple.  Pulmonary:  Clear to auscultation bilaterally.  Cardiovascular:  normal S1/S2, no murmur  Gastrointestinal:  Bowel sounds positive soft nontender no masses no organomegaly no ascites  Musculoskeletal:  Bilateral extremities- no cyanosis, clubbing or edema.   Skin:  no rash.  Neurologic:  Grossly non focal.    Risks:  Patient is aware and understands the below risks:  Failure to do workup and have follow up could result in missed cancer.   Risk of EGD or colonoscopy:  Bleeding and infection, 3% chance missed cancer, 15% chance missed polyps, 1  in 5000 chance of perforation which could lead to surgery and lead to permanent disability and/or death.    Labs:    Results for orders placed or performed in visit on 12/09/15 (from the past 2016 hour(s))   URINALYSIS, MACROSCOPIC    Collection  Time: 12/09/15 10:05 AM   Result Value Ref Range    COLOR Straw Light Yellow, Straw, Yellow    APPEARANCE Clear Clear    PH 7.0 <8.0    LEUKOCYTES Negative Negative WBCs/uL    NITRITE Negative Negative    PROTEIN Negative Negative mg/dL    GLUCOSE Negative Negative mg/dL    KETONES Negative Negative mg/dL    UROBILINOGEN < 2.0 <=2.0 mg/dL    BILIRUBIN Negative Negative mg/dL    BLOOD Negative Negative mg/dL    SPECIFIC GRAVITY 1.006 <1.022        Assessment and Plan       ICD-10-CM    1. Gastroesophageal reflux disease, esophagitis presence not specified K21.9 Refer to [facility] GI - Dr. Myrla Halsted     CBC/DIFF (AUTOMATED)     COMPREHENSIVE METABOLIC PANEL, NON-FASTING     HEPATITIS C ANTIBODY - [facility] ONLY     IGA SUBCLASSES - [facility] ONLY     TISSUE TRANSGLUTAMINASE (TTG) ANTIBODY, IGA, SERUM     UPPER ENDOSCOPY   2. Need for hepatitis C screening test Z11.59 CBC/DIFF (AUTOMATED)     COMPREHENSIVE METABOLIC PANEL, NON-FASTING     HEPATITIS C ANTIBODY - [facility] ONLY     IGA SUBCLASSES - [facility] ONLY     TISSUE TRANSGLUTAMINASE (TTG) ANTIBODY, IGA, SERUM     UPPER ENDOSCOPY   3. GERD (gastroesophageal reflux disease) K21.9 CBC/DIFF (AUTOMATED)     COMPREHENSIVE METABOLIC PANEL, NON-FASTING     HEPATITIS C ANTIBODY - [facility] ONLY     IGA SUBCLASSES - [facility] ONLY     TISSUE TRANSGLUTAMINASE (TTG) ANTIBODY, IGA, SERUM     UPPER ENDOSCOPY   4. Iron deficiency anemia D50.9 CBC/DIFF (AUTOMATED)     COMPREHENSIVE METABOLIC PANEL, NON-FASTING     HEPATITIS C ANTIBODY - [facility] ONLY     IGA SUBCLASSES - [facility] ONLY     TISSUE TRANSGLUTAMINASE (TTG) ANTIBODY, IGA, SERUM     UPPER ENDOSCOPY     Return in 6 months (on 07/22/2016).    [place], DO 01/20/2016, 19:17

## 2016-02-15 ENCOUNTER — Ambulatory Visit (INDEPENDENT_AMBULATORY_CARE_PROVIDER_SITE_OTHER): Payer: Medicare Other | Admitting: Family Medicine

## 2016-02-15 ENCOUNTER — Other Ambulatory Visit (INDEPENDENT_AMBULATORY_CARE_PROVIDER_SITE_OTHER): Payer: Self-pay | Admitting: Specialist

## 2016-02-15 VITALS — BP 118/72 | HR 64 | Temp 98.6°F | Ht 66.5 in | Wt 183.8 lb

## 2016-02-15 DIAGNOSIS — R300 Dysuria: Principal | ICD-10-CM

## 2016-02-15 DIAGNOSIS — R109 Unspecified abdominal pain: Secondary | ICD-10-CM

## 2016-02-15 DIAGNOSIS — L609 Nail disorder, unspecified: Secondary | ICD-10-CM

## 2016-02-15 DIAGNOSIS — Z1159 Encounter for screening for other viral diseases: Secondary | ICD-10-CM

## 2016-02-15 DIAGNOSIS — Z23 Encounter for immunization: Secondary | ICD-10-CM

## 2016-02-15 DIAGNOSIS — Z6829 Body mass index (BMI) 29.0-29.9, adult: Secondary | ICD-10-CM

## 2016-02-15 LAB — POCT URINE DIPSTICK
BILIRUBIN: NEGATIVE
BLOOD: NEGATIVE
GLUCOSE: NEGATIVE
KETONE: NEGATIVE
LEUKOCYTES: NEGATIVE
NITRITE: NEGATIVE
PH: 7
PROTEIN: NEGATIVE
SPECIFIC GRAVITY: 1.01
UROBILINOGEN: 0.2

## 2016-02-15 MED ORDER — SULFAMETHOXAZOLE 800 MG-TRIMETHOPRIM 160 MG TABLET
1.00 | ORAL_TABLET | Freq: Two times a day (BID) | ORAL | 0 refills | Status: DC
Start: 2016-02-15 — End: 2016-03-07

## 2016-02-15 NOTE — Nursing Note (Signed)
02/15/16 1400   Urine   QC Urine yes   Time collected 1435   Glucose Negative   Bilirubin Negative   Ketones Negative   Urine Specific Gravity 1.010   Blood (urine) Negative   pH 7.0   Protein Negative   Urobilinogen Normal    Nitrite Negative   Leukocytes Negative   Initials kc

## 2016-02-15 NOTE — Nursing Note (Signed)
Chief Complaint:   Chief Complaint     Medicare Annual             Functional Health Screen  Functional Health Screening:     Patient is under 18:  No   Have you had a recent unexplained weight loss or gain?:  No   Because we are aware of abuse and domestic violence today, we ask all patients: Are you being hurt, hit, or frightened by anyone at your home or in your life?:  No   Do you have any basic needs within your home that are not being met? (such as Food, Shelter, Games developer, Transportation):  No   Patient is under 18 and therefore has no Advance Directives:  No   Patient has:  No Advance   Patient has Advance Directive:  No   Patient offered:  Refused Packet   Screening unable to be completed:  No        BP 118/72  Pulse 64  Temp 37 C (98.6 F)  Ht 1.689 m (5' 6.5")  Wt 83.4 kg (183 lb 12.8 oz)  BMI 29.22 kg/m2  History   Smoking Status   . Never Smoker   Smokeless Tobacco   . Not on file     Patient Health Rating           Depression Screening  PHQ Questionnaire  Little interest or pleasure in doing things.: Not at all  Feeling down, depressed, or hopeless: Not at all  PHQ 2 Total: 0  Allergies:  Allergies   Allergen Reactions   . Percocet [Oxycodone-Acetaminophen] Nausea/ Vomiting   . Beesting [Hymenoptera Allergenic Extract]      Medication History  Reviewed for OTC medication and any new medications, provider will review medication history  Results through Enter/Edit  No results found for this or any previous visit (from the past 24 hour(s)).  POCT Results  Care Team  Patient Care Team:  [name], MD as PCP - General ([place])  Britt Bottom, MD ([facility])    Maye Hides, LPN  2/80/0349, 17:91

## 2016-02-15 NOTE — Progress Notes (Signed)
Medicare Annual Wellness Visit      Name: [name]   Date: 02/15/2016     SUBJECTIVE:   [name] is a 65 y.o. female for presenting for Medicare Wellness exam.   I have reviewed and reconciled the medication list with the patient today.Patient states that she had some burning sensation of her bladder that improved with macrobid at home but she only took 5 days worth then the pain returned.  She has a h/o of needing the urethra dilated and has recently had to get up more at bedtime.  She denies increase in thirst.  She has also noticed a black spot on a toe on her right foot for the past 2 months.  She does not remember an injury and would like to be screened for melenoma.  Denies family history. Last pap smear was 2 years ago and normal and is taking calcium/vitamin D.  I have reviewed and updated as appropriate the past medical, family and social history. 02/15/2016 as summarized below:  Past Medical History:   Diagnosis Date    Breast lump 2005    Resolved prior to biopsy    Hx of migraines     Iron deficiency anemia     LLQ abdominal pain     Pancytopenia      Past Surgical History:   Procedure Laterality Date    Hx breast biopsy Bilateral     Hx cesarean section  1993    Hx colonoscopy      Hx endoscopic sinus surgery      Hx tonsillectomy  1955     Current Outpatient Prescriptions   Medication Sig    Acetaminophen-Caff-Butalbital 50-325-40 mg Oral Capsule Take 1 Cap by mouth Every 4 hours as needed    EPINEPHrine (EPIPEN) 0.3 mg/0.3 mL Injection Auto-Injector 0.3 mL (0.3 mg total) by Intramuscular route Once, as needed for up to 1 dose    esomeprazole magnesium (NEXIUM) 40 mg Oral Capsule, Delayed Release(E.C.) Take 1 Cap (40 mg total) by mouth Every morning before breakfast    Glucosamine HCl 1,500 mg Oral Tablet Take by mouth    lidocaine (XYLOCAINE) 5 % Ointment by Apply Topically route Once per day as needed    raNITIdine (ZANTAC) 300 mg Oral Tablet Take 300 mg by  mouth Every evening    Zolmitriptan (ZOMIG) 5 mg Oral Tablet Take at onset of headache, may repeat in 2 hours if needed.  Max 2/d, max 3d/wk     Family History   Problem Relation Age of Onset    Congestive Heart Failure Mother     Colon Cancer Mother     Asthma Mother     Diabetes Father     Colon Cancer Father     Asthma Sister     Asthma Brother     No Known Problems Daughter     No Known Problems Maternal Grandmother     No Known Problems Maternal Grandfather     No Known Problems Paternal Grandmother     No Known Problems Paternal Grandfather     No Known Problems Son     No Known Problems Maternal Aunt     No Known Problems Maternal Uncle     No Known Problems Paternal Aunt     No Known Problems Paternal Uncle     No Known Problems Other     Breast Cancer Neg Hx     Cervical Cancer Neg Hx  Liver Cancer Neg Hx     Lung Cancer Neg Hx     Lymphoma Neg Hx     Melanoma Neg Hx     Ovarian Cancer Neg Hx     Pancreatic Cancer Neg Hx     Thyroid Cancer Neg Hx     Uterine Cancer Neg Hx     Cancer Neg Hx     Prostate Cancer Neg Hx     Leukemia Neg Hx          Social History     Social History    Marital status: Married     Spouse name: N/A    Number of children: N/A    Years of education: N/A     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     List of Current Health Care Providers   Care Team     PCP     Name Type Specialty Phone Number    [name], MD Physician [facility] [phone]          Care Team     Name Type Specialty Phone Number    Britt Bottom, MD Physician [facility] [phone]                  Health  Maintenance   Topic Date Due    Adult Tdap-Td (1 - Tdap) 06/21/1968    Hepatitis C screening antibody  06/21/1994    Zoster vaccine  06/21/2009    Annual Wellness Exam  06/13/2012    Pneumococcal Adults 65+ (1 of 2 - PCV13) 06/21/2014    Depression Screening  12/29/2015    Influenza Vaccine (1) 02/04/2016    Mammography  01/24/2017    Colonoscopy  02/24/2025    Osteoporosis screening  Completed     Medicare Wellness Assessment   Medicare initial or wellness physical in the last year?: No  Advance Directives (optional)   Does patient have a living will or MPOA: no   Has patient provided [employer] with a copy?: no   Advance directive information given to the patient today?: no (refused)      Activities of Daily Living   Do you need help with dressing, bathing, or walking?: No   Do you need help with shopping, housekeeping, medications, or finances?: No   Do you have rugs in hallways, broken steps, or poor lighting?: No   Do you have grab bars in your bathroom, non-slip strips in your tub, and hand rails on your stairs?: Yes   Urinary Incontinence Screen (Women >=65 only)   Do you ever leak urine when you don't want to?: YES   Cognitive Function Screen (1=Yes, 0=No)   What is you age?: Correct   What is the time to the nearest hour?: Correct   What is the year?: Correct   What is the name of this clinic?: Correct   Can the patient recognize two persons (the doctor, the nurse, home  help, etc.)?: Correct   What is the date of your birth? (day and month sufficient) : Correct   In what year did World War II end?: Correct   Who is the current president of the [place]?: Correct   Count from 20 down to 1?: Correct   What address did I give you earlier?: Correct   Total Score: 10   Hearing Screen   Have you noticed any hearing difficulties?: Yes  After whispering 9-1-6 how many numbers did the patient repeat correctly?: 3   Fall Risk Screen   Do you feel unsteady when standing or walking?: Yes  Do you  worry about falling?: No  Have you fallen in the past year?: Yes  How many times have you fallen?: Once  Were you ever injured from falling?: No  Timed up and go test (in seconds): 9   Vision Screen   Right Eye = 20: 30 (glasses)   Left Eye = 20: 40 (glasses)   Depression Screen   Little interest or pleasure in doing things.: Not at all  Feeling down, depressed, or hopeless: Not at all  PHQ 2 Total: 0        OBJECTIVE:   BP 118/72   Pulse 64   Temp 37 C (98.6 F)   Ht 1.689 m (5' 6.5")   Wt 83.4 kg (183 lb 12.8 oz)   BMI 29.22 kg/m2   Other appropriate exam:Lungs:clear, CV: RRR, s1 and s2, right 3rd toe with small black spot under nail    Health Maintenance Due   Topic Date Due    Adult Tdap-Td (1 - Tdap) 06/21/1968    Hepatitis C screening antibody  06/21/1994    Zoster vaccine  06/21/2009    Annual Wellness Exam  06/13/2012    Pneumococcal Adults 65+ (1 of 2 - PCV13) 06/21/2014    Depression Screening  12/29/2015    Influenza Vaccine (1) 02/04/2016      ASSESSMENT & PLAN:    Identified Risk Factors/ Recommended Actions   BMI addressed: Advised on diet, weight loss, and exercise to reduce their above normal BMI.   Labs ordered    Fall Risk Follow up plan of care: Discussed optimizing home safety    Urinary Incontinence Plan of Care: Referral to urology/urogynecology  Orders Placed This Encounter    POCT Urine Dipstick   will repeat abx treatment with 7 days of bactrim and refer to urology       The patient has been educated about risk factors and recommended preventive care. Written Prevention Plan completed/ updated and given to patient (see After Visit Summary).        [name], MD  [facility]  [address] [place] [address]  [phone]

## 2016-02-15 NOTE — Nursing Note (Signed)
02/15/16 1400   Medicare Wellness Assessment   Medicare initial or wellness physical in the last year? No   Advance Directives   Does patient have a living will or MPOA no   Has patient provided [employer] with a copy? no   Advance directive information given to the patient today? no  (refused)   Activities of Daily Living   Do you need help with dressing, bathing, or walking? No   Do you need help with shopping, housekeeping, medications, or finances? No   Do you have rugs in hallways, broken steps, or poor lighting? No   Do you have grab bars in your bathroom, non-slip strips in your tub, and hand rails on your stairs? Yes   Urinary Incontinence Screen-Women only   Do you ever leak urine when you don't want to? YES   Cognitive Function Screen   What is you age? 1   What is the time to the nearest hour? 1   What is the year? 1   What is the name of this clinic? 1   Can the patient recognize two persons (the doctor, the nurse, home help, etc.)? 1   What is the date of your birth? (day and month sufficient)  1   In what year did World War II end? 1   Who is the current president of the [place]? 1   Count from 20 down to 1? 1   What address did I give you earlier? 1   Total Score 10   Depression Screen   Little interest or pleasure in doing things. 0   Feeling down, depressed, or hopeless 0   PHQ 2 Total 0   Hearing Screen   Have you noticed any hearing difficulties? Yes   After whispering 9-1-6 how many numbers did the patient repeat correctly? 3   Fall Risk Assessment   Do you feel unsteady when standing or walking? Yes   Do you worry about falling? No   Have you fallen in the past year? Yes   How many times have you fallen? Once   Were you ever injured from falling? No   Timed up and go test (in seconds) 9   Vision Screen   Right Eye = 20 30  (glasses)   Left Eye = 20 40  (glasses)

## 2016-02-15 NOTE — Patient Instructions (Signed)
Medicare Preventive Services  Medicare coverage information Recommendation for YOU   Heart Disease and Diabetes   Lipid profile every 5 years Lab Results   Component Value Date    CHOLESTEROL 220 (H) 06/11/2013    HDLCHOL 99 (H) 06/11/2013    LDLCHOL 114 06/11/2013    TRIG 34 (D) 06/11/2013        Diabetes Screening  yearly for those at risk for diabetes, 2 tests per year for those with prediabetes No results found for: GLUCOSEFAST    Diabetes Self Management Training or Medical Nutrition Therapy  for those with diabetes, up to 10 hrs initial training within a year, subsequent years up to 2 hrs of follow up training Optional for those with diabetes     Medical Nutrition Therapy three hours of one-on-one counseling in first year, two hours in subsequent years Optional for those with diabetes, kidney disease   Intensive Behavioral Therapy for Obesity  Face-to-face counseling, first month every week, month 2-6 every other week, month 7-12 every month if continued progress is documented Optional for those with Body Mass Index 30 or higher  Your Body mass index is 29.22 kg/(m^2).   Tobacco Cessation (Quitting) Counseling   two attempts per year, max 4 sessions per attempt, up to 8 per year, for those with tobacco-related health condition Optional for those that use tobacco   Cancer Screening   Colorectal screening   for anyone age 5 to 50 or any age if high risk:   Screening Colonoscopy every 10 yrs if low risk,  more frequent if higher risk  OR   Flexible  Sigmoidoscopy  every 5 yr OR   Fecal Occult Blood Testing yearly OR   Cologuard Stool DNA test once every 3 years OR   CT Colonography every 5 yrs    See your schedule below   Screening Pap Test recommended every 3 years for all women age 10 to 63, or every five years if combined with HPV test (routine screening not needed after total hysterectomy).  Medicare covers every 2 years, up to yearly if high risk.  Screening Pelvic Exam Medicare covers every 2 years,  yearly if high risk or childbearing age with abnormal Pap in last 3 yrs. See your schedule below   Screening Mammogram   Recommended every 1-2 years for women age 38 to 26, and selectively recommended for women between 82-49 based on shared decisions about risk. Covered by Medicare up to every year for women age 37 or older See your schedule below   Lung Cancer Screening  Annual low dose computed tomography (LDCT scan) is recommended for those age 32-77 who smoked 30 pack-years and are current smokers or quit smoking within past 15 years (one pack-year= smoking one PPD for one year), after counseling by your doctor or nurse clinician about the possible benefits or harms See your schedule below   Vaccinations   Pneumococcal Vaccine recommended routinely age 27+ with two separate vaccines one year apart (Prevnar then Pneumovax).  Recommended before age 80 if medical conditions increase risk  Seasonal Influenza Vaccine once every flu season   Hepatitis B Vaccine 3 doses if risk (including anyone with diabetes or liver disease)  Zostavax (shingles) Vaccine ONCE age 4 or older  Diphtheria Tetanus Pertussis Vaccine ONCE as adult, booster every 10 years   There is no immunization history for the selected administration types on file for this patient.   Other Screening   Bone Densitometry   every 24  months for anyone at risk, including postmenopausal       Glaucoma Screening   yearly if in high risk group such as diabetes, family history, African American age 89+ or Hispanic American age 44+      Hepatitis C Screening recommended ONCE for those born between 1945-1965, or high risk for HCV infection     HIV Testing   yearly or up to 3 times in pregnancy     Abdominal Aortic Aneurysm Screening Ultrasound   once with Initial Welcome to Medicare Physical within 12 months of coverage if  35 + with family history of AAA       Your Personalized Schedule for Preventive Tests   Health Maintenance: Pending and Last Completed        Date Due Completion Date    Adult Tdap-Td (1 - Tdap) 06/21/1968 ---    Hepatitis C screening antibody 06/21/1994 ---    Zoster vaccine 06/21/2009 ---    Annual Wellness Exam 06/13/2012 ---    Pneumococcal Adults 65+ (1 of 2 - PCV13) 06/21/2014 ---    Depression Screening 12/29/2015 12/29/2014    Influenza Vaccine (1) 02/04/2016 ---    Mammography 01/24/2017 01/25/2015    Colonoscopy 02/24/2025 02/25/2015

## 2016-02-15 NOTE — Telephone Encounter (Signed)
Review erx

## 2016-02-16 ENCOUNTER — Encounter (INDEPENDENT_AMBULATORY_CARE_PROVIDER_SITE_OTHER): Payer: Self-pay | Admitting: Family Medicine

## 2016-02-23 ENCOUNTER — Telehealth (INDEPENDENT_AMBULATORY_CARE_PROVIDER_SITE_OTHER): Payer: Self-pay | Admitting: GASTROENTEROLOGY

## 2016-02-23 NOTE — Telephone Encounter (Signed)
Called and left patient message to call and schedule her EGD.   [place]

## 2016-03-06 ENCOUNTER — Telehealth (INDEPENDENT_AMBULATORY_CARE_PROVIDER_SITE_OTHER): Payer: Self-pay | Admitting: GASTROENTEROLOGY

## 2016-03-06 NOTE — Telephone Encounter (Signed)
Patient was to call and let the office know when she wanted to do her EGd and where.  Hadn't heard from patient called her and spoke with her and she said she isn't interested in doing it at this time.  SEh

## 2016-03-07 ENCOUNTER — Ambulatory Visit (INDEPENDENT_AMBULATORY_CARE_PROVIDER_SITE_OTHER): Payer: Medicare Other | Admitting: Urology

## 2016-03-07 ENCOUNTER — Encounter (INDEPENDENT_AMBULATORY_CARE_PROVIDER_SITE_OTHER): Payer: Self-pay | Admitting: Urology

## 2016-03-07 VITALS — BP 106/64 | HR 69 | Temp 97.2°F | Ht 67.5 in | Wt 183.0 lb

## 2016-03-07 DIAGNOSIS — Z6828 Body mass index (BMI) 28.0-28.9, adult: Secondary | ICD-10-CM

## 2016-03-07 DIAGNOSIS — R300 Dysuria: Secondary | ICD-10-CM

## 2016-03-07 DIAGNOSIS — N3941 Urge incontinence: Secondary | ICD-10-CM

## 2016-03-07 DIAGNOSIS — R3914 Feeling of incomplete bladder emptying: Secondary | ICD-10-CM

## 2016-03-07 MED ORDER — LIDOCAINE 2 % MUCOSAL JELLY IN APPLICATOR
5.0000 mL | Freq: Once | 0 refills | Status: AC
Start: 2016-03-07 — End: 2016-03-07

## 2016-03-07 MED ORDER — TAMSULOSIN 0.4 MG CAPSULE
0.40 mg | ORAL_CAPSULE | Freq: Every evening | ORAL | 5 refills | Status: DC
Start: 2016-03-07 — End: 2016-05-18

## 2016-03-07 MED ORDER — SULFAMETHOXAZOLE 800 MG-TRIMETHOPRIM 160 MG TABLET
1.00 | ORAL_TABLET | Freq: Two times a day (BID) | ORAL | 0 refills | Status: DC
Start: 2016-03-07 — End: 2016-05-18

## 2016-03-07 NOTE — Nursing Note (Signed)
03/07/16 1400   Urine   Glucose Negative   Bilirubin Negative   Ketones Negative   Urine Specific Gravity 1.010   Blood (urine) Negative   pH 6.0   Protein Negative   Urobilinogen Normal    Nitrite Negative   Leukocytes Negative   Initials jk

## 2016-03-07 NOTE — Nursing Note (Signed)
03/07/16 1500 03/07/16 1550   Medication Administration   Initials JM JM   Medication Name Bactrim DS Lidocaine Jelly   Other Medication --  2%   Medication Dose 800mg /160mg  5 ml   Route of Administration PO --    [facility] # [id number] [phone]   LOT # [id number]   Expiration date 02/03/17 10/03/17   Manufacturer [place] Yes Yes   Patient Supplied No No   Comments: cysto cysto

## 2016-03-07 NOTE — Procedures (Signed)
A 16 French flexible cystoscope was inserted through the urethra into the bladder.  Pan cystourethroscopy was performed.  There was no evidence of urethral stricture, but the urethra did appear to be slightly longer than normal.  Left ureteral orifice was visualized and was seen to efflux clear urine.  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  There is no evidence of any obvious bladder tumors, stones, or diverticula seen.

## 2016-03-07 NOTE — Nursing Note (Signed)
03/07/16 1500   Urine   PVR Volume 161 ml   Initials JM

## 2016-03-07 NOTE — Progress Notes (Signed)
[facility]  [place], [place] [address]    UROLOGY NOTE    [name], Talwar, 65 y.o. female  Date of service: 03/07/2016  Date of Birth:  [birth year]    Chief Complaint:  Urinary frequency and urgency incontinence    Assessment/Plan:  Patient with incomplete bladder emptying with associated severe urinary frequency and urgency incontinence-plan for trial of Flomax and timed voiding and double voiding.  Follow-up in 4-6 weeks, and if symptoms remain refractory to medical therapy may consider trial of InterStim.  Informational brochures pertaining to this were provided to her.    Orders Placed This Encounter    ULTRASOUND MEASURING PVR (AMB ONLY)    Cystoscopy Procedures    Cystoscopy Procedures    POCT URINE DIPSTICK    tamsulosin (FLOMAX) 0.4 mg Oral Capsule, Sust. Release 24 hr    trimethoprim-sulfamethoxazole (BACTRIM DS) 160-800mg  per tablet    lidocaine 2% 2 % Mucous Membrane Jelly in Applicator       HPI/Discussion:  [name] is a 65 y.o., White female who presents with very severe lower urinary tract symptoms.  She reports that she urinates approximately 20 5 times per day, and is up 6-8 times per night to urinate.  She reports urgency incontinence, saying that 3 steps after she gets the urge to urinate she will start leaking.  She denies significant stress incontinence.  She had undergone a urethral opening procedure and subsequently multiple urethral dilations in [place].  She saw a uro gynecologist at [facility] in the 1990s who told her she had mild prolapse but did not recommend any surgical interventions.  She denies any pain with urination or problems with recurrent urinary tract infections.  Her bladder scan showed elevated postvoid residual of 160 cc.  Urinalysis is negative for any signs of infection, and cystoscopy was negative except for nonvisualization of the right ureteral orifice.    Past Medical History:   Diagnosis  Date    Breast lump 2005    Resolved prior to biopsy    Detached retina     Esophageal reflux     Hx of migraines     Iron deficiency anemia     LLQ abdominal pain     Migraine     Osteoporosis     Pancytopenia     Prolapsed uterus        Past Surgical History:   Procedure Laterality Date    HX BREAST BIOPSY Bilateral     benign     HX CESAREAN SECTION  1993    HX COLONOSCOPY      HX ENDOSCOPIC SINUS SURGERY      HX TONSILLECTOMY  1955       Current Outpatient Prescriptions   Medication Sig    Acetaminophen-Caff-Butalbital 50-325-40 mg Oral Capsule Take 1 Cap by mouth Every 4 hours as needed    EPINEPHrine (EPIPEN) 0.3 mg/0.3 mL Injection Auto-Injector 0.3 mL (0.3 mg total) by Intramuscular route Once, as needed for up to 1 dose    esomeprazole magnesium (NEXIUM) 40 mg Oral Capsule, Delayed Release(E.C.) Take 1 Cap (40 mg total) by mouth Every morning before breakfast    Glucosamine HCl 1,500 mg Oral Tablet Take by mouth    lidocaine (XYLOCAINE) 5 % Ointment by Apply Topically route Once per day as needed    lidocaine 2% 2 % Mucous Membrane Jelly in Applicator Apply 5 mL topically One time for 1 dose    raNITIdine (ZANTAC)  300 mg Oral Tablet Take 300 mg by mouth Every evening    tamsulosin (FLOMAX) 0.4 mg Oral Capsule, Sust. Release 24 hr Take 1 Cap (0.4 mg total) by mouth Every evening after dinner    trimethoprim-sulfamethoxazole (BACTRIM DS) 160-800mg  per tablet Take 1 Tab (160 mg total) by mouth Every 12 hours    Zolmitriptan (ZOMIG) 5 mg Oral Tablet Take at onset of headache, may repeat in 2 hours if needed.  Max 2/d, max 3d/wk     Allergies   Allergen Reactions    Percocet [Oxycodone-Acetaminophen] Nausea/ Vomiting    Beesting [Hymenoptera Allergenic Extract]      Family History  Family Medical History     Problem Relation (Age of Onset)    Asthma Mother, Sister, Brother    Colon Cancer Mother, Father    Congestive Heart Failure Mother    Diabetes Father    No Known Problems  Daughter, Maternal Grandmother, Maternal Grandfather, Paternal Grandmother, Paternal Grandfather, Son, Maternal 69, Maternal Uncle, Paternal 30, Paternal Interior and spatial designer, Other              Social History  Social History     Social History    Marital status: Married     Spouse name: N/A    Number of children: N/A    Years of education: N/A     Occupational History    Education officer, 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  Otherwise 12 point review of systems is negative.    EXAM:  Temperature: 36.2 C (97.2 F)  Heart Rate: 69  BP (Non-Invasive): 106/64  General: appears in good health  Eyes: Pupils equal and round.   HENT:Ear canals normal.  Neck: no adenopathy  Lungs: normal percussion bilaterally  Cardiovascular: S1, S2 normal  Abdomen: non-distended  Genito-urinary: normal labia and normal urethral meatus without lesions or hypermobility  Extremities: No cyanosis or edema  Skin: No lesions  Neurologic: Grossly normal  Lymphatics: No lymphadenopathy  Psychiatric: Affect Normal    Labs:  I have reviewed all lab results.  Routine U/A:     Lab Results   Component Value Date    APPEARUR CLEAR 03/10/2010    COLORUR LT. YELLOW 03/10/2010    [place] 1.004 03/10/2010    GLUCOSEUR NEGATIVE 03/10/2010    BILIUR NEGATIVE 03/10/2010    KETOUR NEGATIVE 03/10/2010    BLDUR NEGATIVE 03/10/2010    PH 7.0 02/15/2016    PROTEINUR NEGATIVE 03/10/2010    UURO <2.0 03/10/2010    NITRI NEGATIVE 03/10/2010    LEUKESTUR NEGATIVE 03/10/2010    RBCURC 0-2 03/10/2010    [place] 0-2  03/10/2010         Marthe Patch, MD 03/07/2016, 17:01

## 2016-03-10 ENCOUNTER — Telehealth (INDEPENDENT_AMBULATORY_CARE_PROVIDER_SITE_OTHER): Payer: Self-pay | Admitting: Urology

## 2016-03-10 NOTE — Telephone Encounter (Signed)
Pt called office and left vm with questions regarding the medication prescribed for her at her office visit.  Called pt and LMTCB.  Sandi Mealy, MA  03/10/2016, 15:20

## 2016-04-05 ENCOUNTER — Other Ambulatory Visit (INDEPENDENT_AMBULATORY_CARE_PROVIDER_SITE_OTHER): Payer: Medicare Other | Admitting: Urology

## 2016-04-12 ENCOUNTER — Other Ambulatory Visit (INDEPENDENT_AMBULATORY_CARE_PROVIDER_SITE_OTHER): Payer: Self-pay | Admitting: Family Medicine

## 2016-04-12 NOTE — Telephone Encounter (Signed)
Refill request.    Maye Hides, LPN  [id number], [id number]

## 2016-05-17 ENCOUNTER — Encounter (HOSPITAL_BASED_OUTPATIENT_CLINIC_OR_DEPARTMENT_OTHER): Payer: Self-pay

## 2016-05-17 ENCOUNTER — Ambulatory Visit (HOSPITAL_BASED_OUTPATIENT_CLINIC_OR_DEPARTMENT_OTHER): Payer: Medicare PPO | Admitting: Gastroenterology

## 2016-05-17 ENCOUNTER — Ambulatory Visit (HOSPITAL_BASED_OUTPATIENT_CLINIC_OR_DEPARTMENT_OTHER): Payer: Medicare PPO | Admitting: Anesthesiology

## 2016-05-17 ENCOUNTER — Ambulatory Visit
Admission: RE | Admit: 2016-05-17 | Discharge: 2016-05-17 | Disposition: A | Discharge status: Home / Self Care | Payer: Medicare PPO | Source: Ambulatory Visit | Attending: Gastroenterology | Admitting: Gastroenterology

## 2016-05-17 ENCOUNTER — Encounter (HOSPITAL_BASED_OUTPATIENT_CLINIC_OR_DEPARTMENT_OTHER)
Admission: RE | Disposition: A | Discharge status: Home / Self Care | Payer: Self-pay | Source: Ambulatory Visit | Attending: Gastroenterology

## 2016-05-17 DIAGNOSIS — K3189 Other diseases of stomach and duodenum: Secondary | ICD-10-CM | POA: Insufficient documentation

## 2016-05-17 DIAGNOSIS — K219 Gastro-esophageal reflux disease without esophagitis: Secondary | ICD-10-CM | POA: Insufficient documentation

## 2016-05-17 HISTORY — DX: Polyp of colon: K63.5

## 2016-05-17 HISTORY — PX: EGD: SHX3789

## 2016-05-17 HISTORY — DX: Gastro-esophageal reflux disease without esophagitis: K21.9

## 2016-05-17 SURGERY — DONT USE, USE 1095-ESOPHAGOGASTRODUODENOSCOPY (EGD), DIAGNOSTIC
Anesthesia: Anesthesia MAC / Sedation | Site: Abdomen | Wound class: Clean Contaminated

## 2016-05-17 MED ORDER — SODIUM CHLORIDE 0.9 % IV SOLN
INTRAVENOUS | Status: DC
Start: 2016-05-17 — End: 2016-05-17

## 2016-05-17 MED ORDER — ONDANSETRON 4 MG PO TBDP
4.0000 mg | ORAL_TABLET | ORAL | Status: DC | PRN
Start: 2016-05-17 — End: 2016-05-17

## 2016-05-17 MED ORDER — PEG 3350-KCL-NABCB-NACL-NASULF 236 G PO SOLR
4.0000 L | ORAL | Status: DC | PRN
Start: 2016-05-17 — End: 2016-05-17

## 2016-05-17 MED ORDER — MIDAZOLAM HCL 5 MG/5ML IJ SOLN
INTRAMUSCULAR | Status: DC | PRN
Start: 2016-05-17 — End: 2016-05-17
  Administered 2016-05-17: 2 mg via INTRAVENOUS

## 2016-05-17 MED ORDER — PROPOFOL 200 MG/20ML IV EMUL
INTRAVENOUS | Status: AC
Start: 2016-05-17 — End: ?
  Filled 2016-05-17: qty 20

## 2016-05-17 MED ORDER — LIDOCAINE HCL (PF) 2 % IJ SOLN
INTRAMUSCULAR | Status: DC | PRN
Start: 2016-05-17 — End: 2016-05-17
  Administered 2016-05-17: 60 mg via INTRAVENOUS

## 2016-05-17 MED ORDER — PROPOFOL 200 MG/20ML IV EMUL
INTRAVENOUS | Status: DC | PRN
Start: 2016-05-17 — End: 2016-05-17
  Administered 2016-05-17 (×2): 10 mg via INTRAVENOUS
  Administered 2016-05-17: 20 mg via INTRAVENOUS
  Administered 2016-05-17: 50 mg via INTRAVENOUS

## 2016-05-17 MED ORDER — ONDANSETRON HCL 4 MG/2ML IJ SOLN
4.0000 mg | INTRAMUSCULAR | Status: DC | PRN
Start: 2016-05-17 — End: 2016-05-17

## 2016-05-17 MED ORDER — MIDAZOLAM HCL 5 MG/5ML IJ SOLN
INTRAMUSCULAR | Status: AC
Start: 2016-05-17 — End: ?
  Filled 2016-05-17: qty 5

## 2016-05-17 MED ORDER — LACTATED RINGERS IV SOLN
50.0000 mL/h | INTRAVENOUS | Status: DC
Start: 2016-05-17 — End: 2016-05-17

## 2016-05-17 MED ORDER — LIDOCAINE HCL (PF) 2 % IJ SOLN
INTRAMUSCULAR | Status: AC
Start: 2016-05-17 — End: ?
  Filled 2016-05-17: qty 10

## 2016-05-17 SURGICAL SUPPLY — 18 items
CATH GLD PROBE BICAP 7FX210CM (Supply) IMPLANT
CATH GLD PROBE BICAP 7FX300CM (Supply) IMPLANT
CRE BALLOON 10-12 DILATOR 5835 (Supply) IMPLANT
CRE BALLOON 12-15 DILATOR 5836 (TDC (Tubes, Draines, Catheters))
CRE BALLOON 12-15 DILATOR 5836 (TDC (Tubes, Drains, Catheters)) IMPLANT
CRE BALLOON 15-18 DILATOR 5837 (Supply) IMPLANT
CRE BALLOON 18-20 DILATOR 5838 (Supply) IMPLANT
CRE BALLOON 6-8 DILAT 5833 (Supply) IMPLANT
CRE BALLOON 8-10 DILAT 5834 (Supply) IMPLANT
CRE PYLORIC COL 10-12 DIL 5847 (Supply) IMPLANT
CRE PYLORIC COL 12-15 DIL 5848 (Supply) IMPLANT
CRE PYLORIC COL 15-18 DIL 5849 (Supply) IMPLANT
CRE PYLORIC COL 8-10 DIL 5846 (Supply) IMPLANT
DEVICE STERIFLATE INFLATION (Supply) IMPLANT
DIL CRE 18-20 PYL CLN 5850 (Supply) IMPLANT
FORCEP BIOPSY RAD JAW 1333-40 (Supply) ×2 IMPLANT
MARKER ENDOSCOPIC SPOT (Supply) IMPLANT
NDL INTERJECT SCLERO 25G (Supply) IMPLANT

## 2016-05-17 NOTE — Transfer of Care (Signed)
Anesthesia Transfer of Care Note    Patient: Nicole Sutton    Last vitals:   Vitals:    05/17/16 0904   BP: 143/85   Pulse: 66   Resp: 18   SpO2: 100%       Oxygen: Nasal Cannula     Mental Status:awake    Airway: Natural    Cardiovascular Status:  stable

## 2016-05-17 NOTE — Anesthesia Preprocedure Evaluation (Signed)
Anesthesia Evaluation    AIRWAY    Mallampati: II    TM distance: >3 FB  Neck ROM: full  Mouth Opening:full   CARDIOVASCULAR    cardiovascular exam normal, regular and normal       DENTAL    no notable dental hx     PULMONARY    pulmonary exam normal and clear to auscultation     OTHER FINDINGS              Relevant Problems   No active problems are marked relevant to this note.               Anesthesia Plan    ASA 2     MAC               (GERD. Denies exertional CP/SOB)      intravenous induction   Detailed anesthesia plan: MAC        Post op pain management: per surgeon    informed consent obtained                   Signed by: Glennie Isle Averlee Swartz 05/17/16 8:20 AM

## 2016-05-17 NOTE — Anesthesia Postprocedure Evaluation (Signed)
Anesthesia Post Evaluation    Patient: Nicole Sutton    Procedures performed: Procedure(s):  EGD    Anesthesia type: MAC    Patient location:[facility]    Last vitals:   Vitals:    05/17/16 0940   BP: 112/61   Pulse:    Resp:    SpO2: 100%       Post pain: Patient not complaining of pain, continue current therapy      Mental Status:awake and alert     Respiratory Function: tolerating room air    Cardiovascular: stable    Nausea/Vomiting: patient not complaining of nausea or vomiting    Hydration Status: adequate    Post assessment: no apparent anesthetic complications

## 2016-05-17 NOTE — Discharge Instr - AVS First Page (Signed)
[facility]  Upper Endoscopy Discharge Instructions    Dear Patient,  Thank you for allowing us to be a part of your health care experience.  We realize you may not fully recall your discharge care.  The following information is to guide you.    A.  After you leave the hospital:   1.  Due to the effects of the sedatives, you may feel tired for the remainder of today.   2.  DO NOT DRIVE OR OPERATE HAZARDOUS MACHINERY until tomorrow.   3.  Please rest and drink extra fluids today.  Avoid alcohol today.   4.  Resume your normal diet as tolerated.   5.  A sore throat or hoarse throat is normal.  Gargle with warm salt water or use a throat lozenge.   6.  You may feel bloated and pass air today.   7.  You may resume your normal activities (work) tomorrow.    B.  If you experience any of the following danger signs or symptoms, call your doctor immediately:  After business hours call [phone] for [facility] for physician guidance.   1.  Vomiting blood.   2. Passing  Blood in the stool or a change of stool consistency (black).   3.  Shortness of breath.   4.  New onset of pain, that does not subside or prevents you from normal daily activity.   5.  Redness or swelling at the IV insertion site that continues or worsens over 2-3 days. Initial warm compresses may be helpful.    All of us at the [facility] who supported you today during your procedure wish you a quick and comfortable recovery.  For any questions or concerns about your personal care, contact us at [phone].

## 2016-05-18 ENCOUNTER — Encounter (INDEPENDENT_AMBULATORY_CARE_PROVIDER_SITE_OTHER): Payer: Self-pay | Admitting: Family Medicine

## 2016-05-18 ENCOUNTER — Ambulatory Visit (INDEPENDENT_AMBULATORY_CARE_PROVIDER_SITE_OTHER): Payer: Medicare Other | Admitting: Family Medicine

## 2016-05-18 ENCOUNTER — Encounter (HOSPITAL_BASED_OUTPATIENT_CLINIC_OR_DEPARTMENT_OTHER): Payer: Self-pay | Admitting: Gastroenterology

## 2016-05-18 VITALS — BP 124/78 | HR 56 | Temp 97.5°F | Resp 14 | Wt 186.8 lb

## 2016-05-18 DIAGNOSIS — L989 Disorder of the skin and subcutaneous tissue, unspecified: Principal | ICD-10-CM

## 2016-05-18 DIAGNOSIS — Z6828 Body mass index (BMI) 28.0-28.9, adult: Secondary | ICD-10-CM

## 2016-05-18 NOTE — Nursing Note (Signed)
BP 124/78   Pulse 56   Temp 36.4 C (97.5 F) (Oral)    Resp 14   Wt 84.7 kg (186 lb 12.8 oz)   BMI 28.83 kg/m2  Eulas Post, CMA  05/18/2016, 15:23

## 2016-06-05 NOTE — Progress Notes (Signed)
Outpatient Visit Note (Assment/Plan;Subjective;Objective)     Patient: [name] Provider: [name], MD   Date of Birth: 02/02/1951 Date of Visit 05/18/2016     Assessment: Plan:       ICD-10-CM    1. Skin lesion  Possible basal cell carcinoma, advised patient to call dermatologist for biopsy L98.9      Problem List Items Addressed This Visit     None      Visit Diagnoses     Skin lesion    -  Primary        No orders of the defined types were placed in this encounter.    Current Outpatient Prescriptions   Medication Sig    Acetaminophen-Caff-Butalbital 50-325-40 mg Oral Capsule Take 1 Cap by mouth Every 4 hours as needed    EPINEPHrine (EPIPEN) 0.3 mg/0.3 mL Injection Auto-Injector 0.3 mL (0.3 mg total) by Intramuscular route Once, as needed for up to 1 dose    esomeprazole magnesium (NEXIUM) 40 mg Oral Capsule, Delayed Release(E.C.) TAKE 1 CAPSULE ORALLY EVERY MORNING BEFORE BREAKFAST    Glucosamine HCl 1,500 mg Oral Tablet Take by mouth    lidocaine (XYLOCAINE) 5 % Ointment by Apply Topically route Once per day as needed    raNITIdine (ZANTAC) 300 mg Oral Tablet Take 300 mg by mouth Every evening    Zolmitriptan (ZOMIG) 5 mg Oral Tablet Take at onset of headache, may repeat in 2 hours if needed.  Max 2/d, max 3d/wk     Follow up: prn    Subjective:   CC:   Chief Complaint   Patient presents with    Spots On Face     [name] is a 66 y.o. female here for Spots On Face  Patient states that she has had a lesion on her right cheek for the past 6 weeks.  She has tried an astringent and acne medication but it hasn't improved.  She denies bleeding or itching. has seen a dermatologist in the past for other lesions.    Objective:   BP 124/78   Pulse 56   Temp 36.4 C (97.5 F) (Oral)    Resp 14   Wt 84.7 kg (186 lb 12.8 oz)   BMI 28.83 kg/m2 Body mass index is 28.83 kg/(m^2).    No LMP recorded. Patient is postmenopausal.  GEN: NAD  Face :right cheek:: small, about 2 mm scaly  erythematous papule      [name], MD  [facility]  [address]  [id number]  [place] [address]  Dept: [phone]  Dept Fax: [phone]  Loc: [phone]  Loc Fax: [phone]

## 2016-06-08 ENCOUNTER — Other Ambulatory Visit (INDEPENDENT_AMBULATORY_CARE_PROVIDER_SITE_OTHER): Payer: Self-pay | Admitting: Family Medicine

## 2016-06-08 NOTE — Telephone Encounter (Signed)
Zantac refill request.   Marga Hoots, LPN  [id number], [id number]

## 2016-06-09 ENCOUNTER — Other Ambulatory Visit (INDEPENDENT_AMBULATORY_CARE_PROVIDER_SITE_OTHER): Payer: Self-pay | Admitting: Family Medicine

## 2016-06-09 NOTE — Telephone Encounter (Signed)
Refill sent on 06/08/16.    Maye Hides, LPN  [id number], [id number]

## 2016-06-28 ENCOUNTER — Other Ambulatory Visit (INDEPENDENT_AMBULATORY_CARE_PROVIDER_SITE_OTHER): Payer: Self-pay | Admitting: Family Medicine

## 2016-08-14 ENCOUNTER — Encounter (INDEPENDENT_AMBULATORY_CARE_PROVIDER_SITE_OTHER): Payer: Self-pay | Admitting: Specialist

## 2016-08-14 ENCOUNTER — Ambulatory Visit (INDEPENDENT_AMBULATORY_CARE_PROVIDER_SITE_OTHER): Payer: Medicare Other | Admitting: Specialist

## 2016-08-14 VITALS — BP 122/72 | HR 72 | Ht 67.5 in | Wt 187.8 lb

## 2016-08-14 DIAGNOSIS — G479 Sleep disorder, unspecified: Secondary | ICD-10-CM

## 2016-08-14 DIAGNOSIS — G629 Polyneuropathy, unspecified: Secondary | ICD-10-CM

## 2016-08-14 DIAGNOSIS — Z6828 Body mass index (BMI) 28.0-28.9, adult: Secondary | ICD-10-CM

## 2016-08-14 DIAGNOSIS — G43719 Chronic migraine without aura, intractable, without status migrainosus: Secondary | ICD-10-CM

## 2016-08-14 MED ORDER — DICLOFENAC SODIUM 50 MG TABLET,DELAYED RELEASE
DELAYED_RELEASE_TABLET | ORAL | 3 refills | Status: DC
Start: 2016-08-14 — End: 2016-11-01

## 2016-08-14 NOTE — Progress Notes (Signed)
RE:  [name]          1951/02/23    Dear Nicole Baron, MD:    I had the pleasure of seeing [name] in follow-up at the [facility] on 08/14/2016.  Today she reports that her headaches have not changed.  She is currently taking nothing for prevention and using zolmitriptan and toradol as needed for her headaches.  She gets good exercise.  Taking magnesium and sometimes coq10.  She is having some foot cramps when she is in bed.  Her feet and hands tingle recently.  Severe Headaches: 15 per month or more.  Only 10 headache free days per month.  Response to abortive medications: Fair  Side effects to medications: n/a    Results of previous testing: n/a    Current Outpatient Prescriptions   Medication Sig Dispense Refill   . Acetaminophen-Caff-Butalbital 50-325-40 mg Oral Capsule Take 1 Cap by mouth Every 4 hours as needed 30 Cap 0   . EPINEPHrine (EPIPEN) 0.3 mg/0.3 mL Injection Auto-Injector 0.3 mL (0.3 mg total) by Intramuscular route Once, as needed for up to 1 dose 2 Each 2   . esomeprazole magnesium (NEXIUM) 40 mg Oral Capsule, Delayed Release(E.C.) TAKE 1 CAPSULE ORALLY EVERY MORNING BEFORE BREAKFAST 30 Cap 5   . Glucosamine HCl 1,500 mg Oral Tablet Take by mouth     . lidocaine (XYLOCAINE) 5 % Ointment by Apply Topically route Once per day as needed     . raNITIdine (ZANTAC) 300 mg Oral Tablet Take 300 mg by mouth Every evening     . raNITIdine (ZANTAC) 300 mg Oral Tablet TAKE ONE TABLET BY MOUTH EVERY EVENING 30 Tab 5   . Zolmitriptan (ZOMIG) 5 mg Oral Tablet Take at onset of headache, may repeat in 2 hours if needed.  Max 2/d, max 3d/wk 9 Tab 11     No current facility-administered medications for this visit.         Past medical history, family history, and social history have been reviewed and confirmed.    Sleep:trouble falling asleep and foot cramps  Mood:Euthymic    Vitals:    08/14/16 1158   BP: 122/72   Pulse: 72   SpO2: 98%   Weight: 85.2 kg (187 lb 12.8 oz)   Height:  1.715 m (5' 7.5")     Her exam reveals she is normocephalic and atraumatic.  Mental status exam shows her to be oriented with good memory, attention, knowledge, and language.  Cranial nerve exam reveals equal and reactive pupils with intact conjugate eye movements, symmetric face, and clear speech, and normal hearing to voice.  Gait is steady and coordination is intact to finger-to-nose testing.  All four extremeties have normal strength.      ICD-10-CM    1. 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 Sleep disorder G47.9    3. Neuropathy ([place]) G62.9 NCS/EMG      She is not interested in botox yet, although she qualifies.  She will get emg/ncv when she returns.  Will consider the cgrp abs when available.    The patient will follow up with me in 6 months or sooner if needed.    Nicole Kail, MD 08/14/2016, 12:17    Zella Richer. Shon Baton MD  Director, [facility]  Associate Professor of Neurology  [facility]

## 2016-08-14 NOTE — Addendum Note (Signed)
Addended by: [name] B on: 08/14/2016 12:22 PM     Modules accepted: Orders

## 2016-08-18 ENCOUNTER — Ambulatory Visit: Payer: Medicare Other | Attending: INTERNAL MEDICINE

## 2016-08-18 DIAGNOSIS — D649 Anemia, unspecified: Secondary | ICD-10-CM

## 2016-08-18 DIAGNOSIS — E611 Iron deficiency: Principal | ICD-10-CM | POA: Insufficient documentation

## 2016-08-18 DIAGNOSIS — D469 Myelodysplastic syndrome, unspecified: Secondary | ICD-10-CM | POA: Insufficient documentation

## 2016-08-18 LAB — CBC WITH DIFF
BASOPHIL #: 0 10*3/uL (ref 0.00–0.10)
BASOPHIL #: 0 10*3/uL (ref 0.00–0.10)
BASOPHIL %: 1 % (ref 0–3)
EOSINOPHIL #: 0.2 10*3/uL (ref 0.00–0.50)
EOSINOPHIL %: 5 % (ref 0–5)
HCT: 38.9 % (ref 36.0–45.0)
HGB: 12.3 g/dL (ref 12.0–15.5)
LYMPHOCYTE #: 1.4 10*3/uL (ref 1.00–4.80)
LYMPHOCYTE %: 30 % (ref 15–43)
MCH: 30.7 pg (ref 27.5–33.2)
MCHC: 31.7 g/dL — ABNORMAL LOW (ref 32.0–36.0)
MCHC: 31.7 g/dL — ABNORMAL LOW (ref 32.0–36.0)
MCV: 96.8 fL (ref 82.0–97.0)
MONOCYTE #: 0.5 10*3/uL (ref 0.20–0.90)
MONOCYTE %: 11 % (ref 5–12)
MPV: 9.2 fL (ref 7.4–10.5)
MPV: 9.2 fL (ref 7.4–10.5)
NEUTROPHIL #: 2.5 10*3/uL (ref 1.50–6.50)
NEUTROPHIL %: 54 % (ref 43–76)
PLATELETS: 169 10*3/uL (ref 150–450)
RBC: 4.01 10*6/uL (ref 4.00–5.10)
RDW: 13.7 % (ref 11.0–16.0)
WBC: 4.6 10*3/uL (ref 4.0–11.0)

## 2016-08-18 LAB — TRANSFERRIN SATURATION PANEL
IRON (TRANSFERRIN) SATURATION: 23 % (ref 15–50)
IRON: 75 ug/dL (ref 28–170)
TOTAL IRON BINDING CAPACITY: 331 ug/dL
TOTAL IRON BINDING CAPACITY: 331 ug/dL
TRANSFERRIN: 232 mg/dL (ref 192–382)

## 2016-08-18 LAB — COMPREHENSIVE METABOLIC PROFILE - BMC/JMC ONLY
ALBUMIN/GLOBULIN RATIO: 1.6 (ref 0.8–2.0)
ALBUMIN: 3.9 g/dL (ref 3.5–5.0)
ALKALINE PHOSPHATASE: 45 U/L (ref 38–126)
ALT (SGPT): 20 U/L (ref 14–54)
ANION GAP: 4 mmol/L (ref 3–11)
AST (SGOT): 27 U/L (ref 15–41)
BILIRUBIN TOTAL: 0.6 mg/dL (ref 0.3–1.2)
BUN/CREA RATIO: 51 — ABNORMAL HIGH (ref 6–22)
BUN: 28 mg/dL — ABNORMAL HIGH (ref 6–20)
BUN: 28 mg/dL — ABNORMAL HIGH (ref 6–20)
CALCIUM: 9 mg/dL (ref 8.8–10.2)
CHLORIDE: 108 mmol/L (ref 101–111)
CO2 TOTAL: 28 mmol/L (ref 22–32)
CREATININE: 0.55 mg/dL (ref 0.44–1.00)
ESTIMATED GFR: >60 mL/min/1.73m?2 (ref >60)
GLUCOSE: 89 mg/dL (ref 70–110)
POTASSIUM: 4.3 mmol/L (ref 3.4–5.1)
PROTEIN TOTAL: 6.4 g/dL (ref 6.4–8.3)
SODIUM: 140 mmol/L (ref 136–145)

## 2016-08-18 LAB — FERRITIN: FERRITIN: 23 ng/mL (ref 11–307)

## 2016-10-11 ENCOUNTER — Ambulatory Visit (INDEPENDENT_AMBULATORY_CARE_PROVIDER_SITE_OTHER): Payer: Medicare Other | Admitting: Family Medicine

## 2016-10-11 ENCOUNTER — Encounter (INDEPENDENT_AMBULATORY_CARE_PROVIDER_SITE_OTHER): Payer: Self-pay | Admitting: Family Medicine

## 2016-10-11 VITALS — BP 118/62 | HR 76 | Temp 98.0°F | Resp 16 | Ht 67.5 in | Wt 190.8 lb

## 2016-10-11 DIAGNOSIS — R350 Frequency of micturition: Secondary | ICD-10-CM

## 2016-10-11 DIAGNOSIS — G4762 Sleep related leg cramps: Secondary | ICD-10-CM

## 2016-10-11 DIAGNOSIS — Z91030 Bee allergy status: Secondary | ICD-10-CM

## 2016-10-11 DIAGNOSIS — R519 Headache, unspecified: Secondary | ICD-10-CM

## 2016-10-11 DIAGNOSIS — Z1231 Encounter for screening mammogram for malignant neoplasm of breast: Secondary | ICD-10-CM

## 2016-10-11 DIAGNOSIS — Z91038 Other insect allergy status: Secondary | ICD-10-CM

## 2016-10-11 DIAGNOSIS — Z6829 Body mass index (BMI) 29.0-29.9, adult: Secondary | ICD-10-CM

## 2016-10-11 DIAGNOSIS — R51 Headache: Principal | ICD-10-CM

## 2016-10-11 MED ORDER — TAMSULOSIN 0.4 MG CAPSULE: 0 mg | Cap | Freq: Every evening | ORAL | 0 refills | 0 days | Status: DC

## 2016-10-11 MED ORDER — EPINEPHRINE 0.3 MG/0.3 ML INJECTION, AUTO-INJECTOR
0.3000 mg | AUTO-INJECTOR | Freq: Once | INTRAMUSCULAR | 2 refills | Status: DC | PRN
Start: 2016-10-11 — End: 2018-11-27

## 2016-10-11 NOTE — Nursing Note (Signed)
Chief Complaint:   Form Needs Completed (Excuse from jury duty due to chronic migraines)    Functional Health Screen        Vital Signs  BP 118/62  Pulse 76  Temp 36.7 C (98 F) (Oral)   Resp 16  Ht 1.715 m (5' 7.5")  Wt 86.5 kg (190 lb 12.8 oz)  BMI 29.44 kg/m2  History   Smoking Status   . Never Smoker   Smokeless Tobacco   . Never Used     Allergies  Allergies   Allergen Reactions   . Percocet [Oxycodone-Acetaminophen] Nausea/ Vomiting   . Beesting [Hymenoptera Allergenic Extract]      Medication History  Reviewed for OTC medication and any new medications, provider will review medication history  Care Team  Patient Care Team:  [name], MD as PCP - General ([place])  Britt Bottom, MD ([facility])    Ivory Broad, MA  10/11/2016, 13:42

## 2016-10-12 ENCOUNTER — Ambulatory Visit: Payer: Medicare Other | Attending: Family Medicine

## 2016-10-12 DIAGNOSIS — R519 Headache, unspecified: Secondary | ICD-10-CM

## 2016-10-12 DIAGNOSIS — R51 Headache: Secondary | ICD-10-CM | POA: Insufficient documentation

## 2016-10-12 DIAGNOSIS — G4762 Sleep related leg cramps: Principal | ICD-10-CM | POA: Insufficient documentation

## 2016-10-12 DIAGNOSIS — R350 Frequency of micturition: Secondary | ICD-10-CM

## 2016-10-12 LAB — BASIC METABOLIC PANEL
ANION GAP: 5 mmol/L (ref 3–11)
BUN/CREA RATIO: 29 — ABNORMAL HIGH (ref 6–22)
BUN: 18 mg/dL (ref 6–20)
CALCIUM: 9.1 mg/dL (ref 8.8–10.2)
CHLORIDE: 108 mmol/L (ref 101–111)
CO2 TOTAL: 28 mmol/L (ref 22–32)
CO2 TOTAL: 28 mmol/L (ref 22–32)
CREATININE: 0.62 mg/dL (ref 0.44–1.00)
ESTIMATED GFR: >60 mL/min/1.73m?2 (ref >60)
GLUCOSE: 93 mg/dL (ref 70–110)
POTASSIUM: 4.3 mmol/L (ref 3.4–5.1)
SODIUM: 141 mmol/L (ref 136–145)
SODIUM: 141 mmol/L (ref 136–145)

## 2016-10-12 LAB — THYROID STIMULATING HORMONE (SENSITIVE TSH): TSH: 1.636 u[IU]/mL (ref 0.340–5.330)

## 2016-10-12 LAB — THYROXINE, FREE (FREE T4): THYROXINE (T4), FREE: 0.89 ng/dL (ref 0.61–1.70)

## 2016-10-12 LAB — OSMOLALITY, RANDOM URINE: OSMOLALITY URINE: 396 mosm/kg (ref 50–1200)

## 2016-10-12 LAB — OSMOLALITY: OSMOLALITY, BLOOD: 295 mosm/kg (ref 275–295)

## 2016-10-12 NOTE — Progress Notes (Signed)
Outpatient Visit Note (Assment/Plan;Subjective;Objective)     Patient: [name] Provider: [name], MD   Date of Birth: Jun 08, 1950 Date of Visit 10/11/2016      Assessment: Plan:       ICD-10-CM    1. Frequent headaches  Due to increase in frequency will check labs and screen for DI due to frequent urination  R51 MRI BRAIN WO/W IV CONTRAST     BASIC METABOLIC PANEL     OSMOLALITY     OSMOLALITY, RANDOM URINE   2. Bee sting allergy  Refill request Z91.038 EPINEPHrine 0.3 mg/0.3 mL Injection Auto-Injector   3. Encounter for screening mammogram for breast cancer Z12.31 MAMMO SCREENING BILATERAL W/ TOMO   4. Urinary frequency  Advised patient to try tamsulosin as prescribed to help reduce nocturia if caused by urinary retention R35.0 tamsulosin (FLOMAX) 0.4 mg Oral Capsule, Sust. Release 24 hr     MRI BRAIN WO/W IV CONTRAST     OSMOLALITY     OSMOLALITY, RANDOM URINE   5. Sleep related leg cramps  G47.62 THYROID STIMULATING HORMONE (SENSITIVE TSH)     THYROXINE, FREE (FREE T4)       Orders Placed This Encounter   . MAMMO SCREENING BILATERAL W/ TOMO   . MRI BRAIN WO/W IV CONTRAST   . BASIC METABOLIC PANEL   . THYROID STIMULATING HORMONE (SENSITIVE TSH)   . THYROXINE, FREE (FREE T4)   . OSMOLALITY   . OSMOLALITY, RANDOM URINE   . EPINEPHrine 0.3 mg/0.3 mL Injection Auto-Injector   . tamsulosin (FLOMAX) 0.4 mg Oral Capsule, Sust. Release 24 hr     Current Outpatient Prescriptions   Medication Sig   . Acetaminophen-Caff-Butalbital 50-325-40 mg Oral Capsule Take 1 Cap by mouth Every 4 hours as needed   . diclofenac sodium (VOLTAREN) 50 mg Oral Tablet, Delayed Release (E.C.) 1 q8h prn severe headache   . EPINEPHrine 0.3 mg/0.3 mL Injection Auto-Injector 0.3 mL (0.3 mg total) by Intramuscular route Once, as needed for up to 1 dose   . esomeprazole magnesium (NEXIUM) 40 mg Oral Capsule, Delayed Release(E.C.) TAKE 1 CAPSULE ORALLY EVERY MORNING BEFORE BREAKFAST   . Glucosamine HCl 1,500 mg Oral Tablet Take by mouth    . lidocaine (XYLOCAINE) 5 % Ointment by Apply Topically route Once per day as needed   . raNITIdine (ZANTAC) 300 mg Oral Tablet Take 300 mg by mouth Every evening   . raNITIdine (ZANTAC) 300 mg Oral Tablet TAKE ONE TABLET BY MOUTH EVERY EVENING   . tamsulosin (FLOMAX) 0.4 mg Oral Capsule, Sust. Release 24 hr Take 1 Cap (0.4 mg total) by mouth Every evening after dinner   . Zolmitriptan (ZOMIG) 5 mg Oral Tablet Take at onset of headache, may repeat in 2 hours if needed.  Max 2/d, max 3d/wk     BMI addressed: Advised on diet, weight loss, and exercise to reduce above normal BMI.          follow up in prn    [id number]    Subjective:   CC:   Chief Complaint   Patient presents with   . Form Needs Completed     Excuse from jury duty due to chronic migraines     [name] is a 66 y.o. female here for multiple issues.      1. Migraine headaches-patient states that her headaches have increased in frequency and she is getting up to 15 a month.  She denies that she continues to have lifestyle  stressors since her parents' estate has been settled.  She denies any neurological changes with her headaches.  She continues to take Zomig which after a couple hours of initially the headache improves.  She has a follow-up appointment with Neurology in September.  2. Urinary frequency-patient continues to urinate multiple times during the day and the night.  She did see Urology and evaluation was done.  No abnormalities was seen on the cystoscope she states.  They did however see that after urination she had a pretty full bladder.  She did not try the Flomax because the pharmacist told her it was for men.  3. Leg cramps-patient states that she gets severe leg cramps that draw her toes down at nighttime.  That is another reason she is seeing Neurology in the fall.  4. Sheri duty-patient states she was requested for jury duty but due to the frequent urination and migraines she thinks she would be unable to sit for long periods of  time to fulfill these duties.  Other medical issues include:  Patient Active Problem List   Diagnosis   . Pancytopenia   . Left lower quadrant pain   . Iron deficiency   . Fatigue   . Dyspnea   . 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 Breast lump 2005    Resolved prior to biopsy   . Detached retina    . Esophageal reflux    . Hx of migraines    . Iron deficiency anemia    . LLQ abdominal pain    . Migraine    . Osteoporosis    . Pancytopenia    . Prolapsed uterus          Past Surgical History:   Procedure Laterality Date   . HX BREAST BIOPSY Bilateral     benign    . HX CESAREAN SECTION  1993   . HX COLONOSCOPY     . HX ENDOSCOPIC SINUS SURGERY     . HX TONSILLECTOMY  1955         Family Medical History     Problem Relation (Age of Onset)    Asthma Mother, Sister, Brother    Colon Cancer Mother, Father    Congestive Heart Failure Mother    Diabetes Father    No Known Problems Daughter, Maternal Grandmother, Maternal Grandfather, Paternal 72, Paternal Grandfather, Son, Maternal Aunt, Maternal Uncle, Paternal 6, Paternal Uncle, Other            Social History     Social History   . Marital status: Married     Spouse name: N/A   . Number of children: N/A   . Years of education: N/A     Occupational History   . 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 Smoking status: Never Smoker   . Smokeless tobacco: Never Used   . Alcohol use No   . Drug use: No   . Sexual activity: Yes     Partners: Male     Other Topics Concern   . Not on file     Social History Narrative     Allergies   Allergen Reactions   . Percocet [Oxycodone-Acetaminophen] Nausea/ Vomiting   . Beesting [Hymenoptera Allergenic Extract]      Medications reviewed  Review of Systems: Other  than ROS in the HPI, all other systems were negative.   Outpatient Medications Prior to Visit   Medication Sig Dispense Refill   . Acetaminophen-Caff-Butalbital 50-325-40 mg  Oral Capsule Take 1 Cap by mouth Every 4 hours as needed 30 Cap 0   . diclofenac sodium (VOLTAREN) 50 mg Oral Tablet, Delayed Release (E.C.) 1 q8h prn severe headache 30 Tab 3   . esomeprazole magnesium (NEXIUM) 40 mg Oral Capsule, Delayed Release(E.C.) TAKE 1 CAPSULE ORALLY EVERY MORNING BEFORE BREAKFAST 30 Cap 5   . Glucosamine HCl 1,500 mg Oral Tablet Take by mouth     . lidocaine (XYLOCAINE) 5 % Ointment by Apply Topically route Once per day as needed     . raNITIdine (ZANTAC) 300 mg Oral Tablet Take 300 mg by mouth Every evening     . raNITIdine (ZANTAC) 300 mg Oral Tablet TAKE ONE TABLET BY MOUTH EVERY EVENING 30 Tab 5   . Zolmitriptan (ZOMIG) 5 mg Oral Tablet Take at onset of headache, may repeat in 2 hours if needed.  Max 2/d, max 3d/wk 9 Tab 11   . EPINEPHrine (EPIPEN) 0.3 mg/0.3 mL Injection Auto-Injector 0.3 mL (0.3 mg total) by Intramuscular route Once, as needed for up to 1 dose 2 Each 2     No facility-administered medications prior to visit.      Objective:     BP 118/62  Pulse 76  Temp 36.7 C (98 F) (Oral)   Resp 16  Ht 1.715 m (5' 7.5")  Wt 86.5 kg (190 lb 12.8 oz)  BMI 29.44 kg/m2 Body mass index is 29.44 kg/(m^2).    No LMP recorded. Patient is postmenopausal.  GEN: NAD  LUNGS: CTA  B, anteriorly and posteriorly  HRT: RRR, neg murmur  ABD: normal bowel sounds, soft, nontender, nondistended, no hepatosplenomegaly  EXT: No edema  NEURO:normal gait, no focal deficits noted, FROM of toes and feet    Labs:   No visits with results within 1 Month(s) from this visit.  Latest known visit with results is:    Appointment on 08/18/2016   Component Date Value Ref Range Status   . SODIUM 08/18/2016 140  136 - 145 mmol/L Final   . POTASSIUM 08/18/2016 4.3  3.4 - 5.1 mmol/L Final   . CHLORIDE 08/18/2016 108  101 - 111 mmol/L Final   . CO2 TOTAL 08/18/2016 28  22 - 32 mmol/L Final   . ANION GAP 08/18/2016 4  3 - 11 mmol/L Final   . BUN 08/18/2016 28* 6 - 20 mg/dL Final   . CREATININE 08/18/2016 0.55  0.44  - 1.00 mg/dL Final   . BUN/CREA RATIO 08/18/2016 51* 6 - 22 Final   . ESTIMATED GFR 08/18/2016 >60  >60 mL/min/1.67m^2 Final   . ALBUMIN 08/18/2016 3.9  3.5 - 5.0 g/dL Final   . CALCIUM 08/18/2016 9.0  8.8 - 10.2 mg/dL Final   . GLUCOSE 08/18/2016 89  70 - 110 mg/dL Final   . ALKALINE PHOSPHATASE 08/18/2016 45  38 - 126 U/L Final   . ALT (SGPT) 08/18/2016 20  14 - 54 U/L Final   . AST (SGOT) 08/18/2016 27  15 - 41 U/L Final   . BILIRUBIN TOTAL 08/18/2016 0.6  0.3 - 1.2 mg/dL Final   . PROTEIN TOTAL 08/18/2016 6.4  6.4 - 8.3 g/dL Final   . ALBUMIN/GLOBULIN RATIO 08/18/2016 1.6  0.8 - 2.0 Final   . TOTAL IRON BINDING CAPACITY 08/18/2016 331  ug/dL Final   .  IRON (TRANSFERRIN) SATURATION 08/18/2016 23  15 - 50 % Final   . IRON 08/18/2016 75  28 - 170 ug/dL Final   . TRANSFERRIN 08/18/2016 232  192 - 382 mg/dL Final   . FERRITIN 08/18/2016 23  11 - 307 ng/mL Final   . WBC 08/18/2016 4.6  4.0 - 11.0 x10^3/uL Final   . RBC 08/18/2016 4.01  4.00 - 5.10 x10^6/uL Final   . HGB 08/18/2016 12.3  12.0 - 15.5 g/dL Final   . HCT 08/18/2016 38.9  36.0 - 45.0 % Final   . MCV 08/18/2016 96.8  82.0 - 97.0 fL Final   . MCH 08/18/2016 30.7  27.5 - 33.2 pg Final   . MCHC 08/18/2016 31.7* 32.0 - 36.0 g/dL Final   . RDW 08/18/2016 13.7  11.0 - 16.0 % Final   . PLATELETS 08/18/2016 169  150 - 450 x10^3/uL Final   . MPV 08/18/2016 9.2  7.4 - 10.5 fL Final   . NEUTROPHIL % 08/18/2016 54  43 - 76 % Final   . LYMPHOCYTE % 08/18/2016 30  15 - 43 % Final   . MONOCYTE % 08/18/2016 11  5 - 12 % Final   . EOSINOPHIL % 08/18/2016 5  0 - 5 % Final   . BASOPHIL % 08/18/2016 1  0 - 3 % Final   . NEUTROPHIL # 08/18/2016 2.50  1.50 - 6.50 x10^3/uL Final   . LYMPHOCYTE # 08/18/2016 1.40  1.00 - 4.80 x10^3/uL Final   . MONOCYTE # 08/18/2016 0.50  0.20 - 0.90 x10^3/uL Final   . EOSINOPHIL # 08/18/2016 0.20  0.00 - 0.50 x10^3/uL Final   . BASOPHIL # 08/18/2016 0.00  0.00 - 0.10 x10^3/uL Final         [name], MD  [facility]  [address]  [address]  [place] [address]  Dept: [phone]  Dept Fax: [phone]  Loc: [phone]  Loc Fax: [phone]

## 2016-10-18 ENCOUNTER — Telehealth (INDEPENDENT_AMBULATORY_CARE_PROVIDER_SITE_OTHER): Payer: Self-pay | Admitting: Family Medicine

## 2016-10-18 NOTE — Telephone Encounter (Signed)
error 

## 2016-10-25 ENCOUNTER — Ambulatory Visit (HOSPITAL_BASED_OUTPATIENT_CLINIC_OR_DEPARTMENT_OTHER): Payer: Medicare Other

## 2016-11-01 ENCOUNTER — Encounter (INDEPENDENT_AMBULATORY_CARE_PROVIDER_SITE_OTHER): Payer: Self-pay | Admitting: Family Medicine

## 2016-11-01 ENCOUNTER — Ambulatory Visit (INDEPENDENT_AMBULATORY_CARE_PROVIDER_SITE_OTHER): Payer: Medicare Other | Admitting: Family Medicine

## 2016-11-01 VITALS — BP 113/71 | HR 68 | Temp 98.1°F | Ht 68.0 in | Wt 191.5 lb

## 2016-11-01 DIAGNOSIS — J069 Acute upper respiratory infection, unspecified: Secondary | ICD-10-CM

## 2016-11-01 DIAGNOSIS — Z6829 Body mass index (BMI) 29.0-29.9, adult: Secondary | ICD-10-CM

## 2016-11-01 MED ORDER — ALBUTEROL SULFATE HFA 90 MCG/ACTUATION AEROSOL INHALER
2.0000 | INHALATION_SPRAY | RESPIRATORY_TRACT | 0 refills | Status: DC | PRN
Start: 2016-11-01 — End: 2017-02-19

## 2016-11-01 MED ORDER — BENZONATATE 200 MG CAPSULE: 200 mg | Cap | Freq: Three times a day (TID) | ORAL | 0 refills | 0 days | Status: DC | PRN

## 2016-11-01 NOTE — Nursing Note (Signed)
BP 113/71  Pulse 68  Temp 36.7 C (98.1 F) (Oral)   Ht 1.727 m (5\' 8" )  Wt 86.9 kg (191 lb 8 oz)  SpO2 99%  BMI 29.12 kg/m2  Duke Salvia, RN  11/01/2016, 14:45

## 2016-11-01 NOTE — Progress Notes (Signed)
[facility]  [address] [place] [address]  Dept: [phone]  Dept Fax: [phone]  Loc: [phone]  Murel Wigle  11/01/16    CHIEF COMPLAINT:Cough and Congestion    [name] is a 66 y.o.   presents to the urgent care today complaining of 3-4 day history of cough, congestion, felt hot and sweaty, reports that her lungs hurt to breathe, has shortness of breath and wheezing.  Denies history of asthma.  Denies nausea, vomiting, dizziness, chest pain, chest tightness.  Unsure if she has seasonal allergies and never had them before.  She has not tried any over-the-counter medication for her symptoms.  Allergies as of 11/01/2016 Elta Guadeloupe as Reviewed 11/01/2016   Allergen Reaction Noted   . Percocet [oxycodone-acetaminophen] Nausea/ Vomiting 09/20/2009   . Beesting [hymenoptera allergenic extract]  09/16/2015     PMH Denies hx of asthma /seasonl allergies .  Social TS:[id number] denies smoking.  Review of Systems   Pertinent items are noted in HPI. All pertinent positives and negatives noted in the HPI  Constitutional: No recent illness  Neuro: No dizziness  Eyes: No eye discharge  ENT:+ sore throat, No rhinorrhea, No sinus pains.  Respiratory: +  cough, shortness of breath,  wheezing  Cardiovascular: no chest pain   Muskuloskeletal: No myalgias, No arthralgias.  GI.no abdominal pain,nausea,vomiting, diarrhea.  Skin: no rashes.    Objective:    Blood pressure 113/71, pulse 68, temperature 36.7 C (98.1 F), temperature source Oral, height 1.727 m (5\' 8" ), weight 86.9 kg (191 lb 8 oz), SpO2 99 %.  Pt in NAD,is able to talk in full sentences.  HEENT: mucous membranes moist. Post nasal drip noted,No sinus tenderness bilaterally.no tonsillar enlargement,erythema or exudate ,no lymphadenpathy , bilateral serous effusion in ears without erythema.  Heart: Regular rate and rhythm, no murmur.   Lungs are clear to auscultation  bilaterally,no rales,rhonchi or wheezing noted bilaterally,good air entry bilaterally.  Skin no rashes on exposed areas of skin.  Extremities: No edema, no cyanosis, positive bilateral peripheral pulses.     Assessment:    1.URI mostly viral   Plan:      This is likely a viral illness and should run a benign course in 7-10 days. I have tried to explain the course of a viral illness, and that most illnesses with these symptoms do present similarly but can be any number of viruses causing the symptoms, and none of these illnesses respond to antibiotics..Advised conservative management and symptomatic care, given prescription for Tessalon Perles p.r.n. cough, albuterol inhaler p.r.n. wheezing, advised over-the-counter antihistamines Advised to followup with primary care in 10 days  if symptoms do not get better or earlier if symptoms worsen. Patient agrees with above plan and verbalized understanding. All questions answered.     Portions of this note may be dictated using voice recognition software . Variances in spelling and vocabulary are possible and unintentional. Not all errors are caught/corrected. Please notify the [facility] if any discrepancies are noted or if the meaning of any statement is not clear.  Maura Crandall, MD  11/01/2016, 14:57

## 2016-11-01 NOTE — Patient Instructions (Addendum)
Viral Upper Respiratory Illness with Wheezing (Adult)  You have a viral upper respiratory illness (URI), which is another term for the common cold. When the infection causes a lot of irritation, the air passages can go into spasm. This causes wheezing and shortness of breath.    This illness is contagious during the first few days. It is spread through the air by coughing and sneezing. It may also be spread by direct contact. This could be by touching the sick person and then touching your own eyes, nose, or mouth. Frequent handwashing will decrease the risk.  Most viral illnesses go away within 7 to 10 days with rest and simple home remedies. Sometimes the illness may last for several weeks. Antibiotics will not kill a virus, and they are generally not prescribed for this condition.  Home care   If symptoms are severe, rest at home for the first 2 to 3 days. When you resume activity, don't let yourself get too tired.   Stay away from cigarette smoke.   You may use acetaminophen or ibuprofen to control pain and fever, unless another medicine was prescribed.If you have chronic liver or kidney disease, have ever had a stomach ulcer or gastrointestinal bleeding, or are taking blood-thinning medicines, talk with your healthcare provider before using these medicines. Aspirin should never be given to anyone under 18 years of age who is ill with a viral infection or fever. It may cause severe liver or brain damage.   Your appetite may be poor, so a light diet is fine. Avoid dehydration by drinking 6 to 8 glasses of fluids per day (water, soft drinks, juices, tea, or soup). Extra fluids will help loosen secretions in the nose and lungs.   Over-the-counter cold medicines will not shorten the length of time you're sick, but they may be helpful for the following symptoms: cough, sore throat, and nasal and sinus congestion.Don't use decongestants if you have high blood pressure.  Follow-up care  Follow up with your  healthcare provider, or as advised.  When to seek medical advice  Call your healthcare provider right away if any of these occur:   Cough with lots of colored sputum (mucus)   Severe headache; face, neck, or ear pain   Difficulty swallowing due to throat pain   Fever of 100.34F (38C) or higher, or as directed by your healthcare provider  Call 911  Call 911 if any of these occur:   Chest pain, shortness of breath, worsening wheezing, or difficulty breathing   Coughing up blood   Can't swallow because of throat pain  Date Last Reviewed: 02/15/2014   2000-2017 [employer]. [address], [place] [address]. All rights reserved. This information is not intended as a substitute for professional medical care. Always follow your healthcare professional's instructions.        [facility]  [address], [place]  [place], [place] [address]  Phone: [phone]  Fax: [phone]             Open Daily 8:00am - 8:00pm, except Sundays 12pm-8pm         ~ Closed Thanksgiving and Christmas Day     Attending Caregiver: Maura Crandall, MD      Today's orders:   Orders Placed This Encounter   . Benzonatate (TESSALON) 200 mg Oral Capsule   . albuterol sulfate (PROAIR HFA) 90 mcg/actuation Inhalation HFA Aerosol Inhaler        Prescription(s) E-Rx to:  [facility]    ________________________________________________________________________  Short Term Disability and [facility] does NOT provide assistance with any disability applications.  If you feel your medical condition requires you to be on disability, you will need to follow up with  Your primary care physician or a specialist.  We apologize for any inconvenience.    For Medication Prescribed by [facility]:  As an Urgent Care facility, our clinic does NOT offer prescription refills over the telephone.    If you need more of the medication one of  our medical providers prescribed, you will  Either need to be re-evaluated by [place] or see your primary care physician.    ________________________________________________________________________      It is very important that we have a phone number that is the single best way to contact you in the event that we become aware of important clinical information or concerns after your discharge.  If the phone number you provided at registration is NOT this number you should inform staff and registration prior to leaving.      Your treatment and evaluation today was focused on identifying and treating potentially emergent conditions based on your presenting signs, symptoms, and history.  The resulting initial clinical impression and treatment plan is not intended to be definitive or a substitute for a full physical examination and evaluation by your primary care provider.  If your symptoms persist, worsen, or you develop any new or concerning symptoms, you need to be evaluated.      If you received x-rays during your visit, be aware that the final and formal interpretation of those films by a radiologist may occur after your discharge.  If there is a significant discrepancy identified after your discharge, we will contact you at the telephone number provided at registration.      If you received a pelvic exam, you may have cultures pending for sexually transmitted diseases.  Positive cultures are reported to the [place] Department of Health as required by state law.  You should be contacted if you cultures are positive.  We will not contact you if they are negative.  You did NOT receive a PAP smear (the screening test for cervical).  This specific test for women is best performed by your gynecologist or primary care provider when indicated.      If you are over 85 year old, we cannot discuss your personal health information with a parent, spouse, family member, or anyone else without your express consent.  This does not include  those who have legitimate access to your records and information to assist in your care under the provisions of HIPAA ([place]) law, or those to whom you have previously given express written consent to do so, such a legal guardian or Power of [place].      You may have received medication that may cause you to feel drowsy and/or light headed for several hours.  You may even experience some amnesia of your stay.  You should avoid operating a motor vehicle or performing any activity requiring complete alertness or coordination until you feel fully awake (approximately 24-48 hours).  Avoid alcoholic beverages.  You may also have a dry mouth for several hours.  This is a normal side effect and will disappear as the effects of the medication wear off.      Instructions discussed with patient upon  discharge by clinical staff with all questions answered.  Please call [facility] [phone]) if any further questions.  Go immediately to the emergency department if any concern or worsening symptoms.      Maura Crandall, MD 11/01/2016, 14:56

## 2016-11-02 ENCOUNTER — Ambulatory Visit (HOSPITAL_BASED_OUTPATIENT_CLINIC_OR_DEPARTMENT_OTHER)
Admission: RE | Admit: 2016-11-02 | Discharge: 2016-11-02 | Disposition: A | Discharge status: Home / Self Care | Payer: Medicare Other | Source: Ambulatory Visit | Attending: Family Medicine | Admitting: Family Medicine

## 2016-11-02 ENCOUNTER — Encounter (INDEPENDENT_AMBULATORY_CARE_PROVIDER_SITE_OTHER): Payer: Self-pay

## 2016-11-02 DIAGNOSIS — R350 Frequency of micturition: Secondary | ICD-10-CM | POA: Insufficient documentation

## 2016-11-02 DIAGNOSIS — R9082 White matter disease, unspecified: Secondary | ICD-10-CM | POA: Insufficient documentation

## 2016-11-02 DIAGNOSIS — R51 Headache: Secondary | ICD-10-CM

## 2016-11-02 DIAGNOSIS — R519 Headache, unspecified: Secondary | ICD-10-CM

## 2016-11-02 MED ORDER — GADOTERIDOL 279.3 MG/ML INTRAVENOUS SOLUTION
18.00 mL | INTRAVENOUS | Status: AC
Start: 2016-11-02 — End: 2016-11-02
  Filled 2016-11-02: qty 20

## 2016-11-02 MED ADMIN — gadoteridoL 279.3 mg/mL intravenous solution: INTRAVENOUS | @ 10:00:00

## 2016-11-02 NOTE — Nursing Note (Signed)
Left a courtesy call back message for the patient to return my call should they have concerns regarding their appt at Urgent Care.  ALP

## 2016-11-06 ENCOUNTER — Encounter (INDEPENDENT_AMBULATORY_CARE_PROVIDER_SITE_OTHER): Payer: Self-pay | Admitting: Family Medicine

## 2016-11-06 ENCOUNTER — Ambulatory Visit (INDEPENDENT_AMBULATORY_CARE_PROVIDER_SITE_OTHER): Payer: Medicare Other | Admitting: Family Medicine

## 2016-11-06 VITALS — BP 120/70 | HR 68 | Temp 97.4°F | Resp 16 | Ht 68.0 in | Wt 191.4 lb

## 2016-11-06 DIAGNOSIS — R350 Frequency of micturition: Principal | ICD-10-CM

## 2016-11-06 DIAGNOSIS — Z6829 Body mass index (BMI) 29.0-29.9, adult: Secondary | ICD-10-CM

## 2016-11-06 DIAGNOSIS — G43909 Migraine, unspecified, not intractable, without status migrainosus: Secondary | ICD-10-CM

## 2016-11-06 NOTE — Progress Notes (Signed)
[facility]  [address]  [address]  [place] [address]     Outpatient Visit Note (Assment/Plan;Subjective;Objective)     Name: [name] MRN:  [id number]   Date: 11/06/2016 Age: 66 y.o.     Assessment & Plan:       ICD-10-CM    1. Urinary frequency  No sxs of infection and no improvement with flomax, advise f/u with urology R35.0    2. Migraine without status migrainosus, not intractable, unspecified migraine type  Increase in frequency due to weather, no success with past preventative medications, advise f/u with neurology G43.909      Problem List Items Addressed This Visit     None      Visit Diagnoses     Urinary frequency    -  Primary    Migraine without status migrainosus, not intractable, unspecified migraine type            No orders of the defined types were placed in this encounter.    Current Outpatient Prescriptions   Medication Sig   . albuterol sulfate (PROAIR HFA) 90 mcg/actuation Inhalation HFA Aerosol Inhaler Take 2 Puffs by inhalation Every 4 hours as needed (Patient not taking: Reported on 11/06/2016)   . Benzonatate (TESSALON) 200 mg Oral Capsule Take 1 Cap (200 mg total) by mouth Three times a day as needed for Cough   . EPINEPHrine 0.3 mg/0.3 mL Injection Auto-Injector 0.3 mL (0.3 mg total) by Intramuscular route Once, as needed for up to 1 dose   . MELOXICAM ORAL Take by mouth   . tamsulosin (FLOMAX) 0.4 mg Oral Capsule, Sust. Release 24 hr Take 1 Cap (0.4 mg total) by mouth Every evening after dinner   . Zolmitriptan (ZOMIG) 5 mg Oral Tablet Take at onset of headache, may repeat in 2 hours if needed.  Max 2/d, max 3d/wk               Follow up: prn    Subjective:   CC:   Chief Complaint   Patient presents with   . Headache     [name] is a 66 y.o. female here for Headache  .      1. Migraine-patient states she has had 4 migraines this week due to the change in the weather.  She has some resolution with Zomig but it is taking longer for the  headaches to resolve.  There has been no neurological changes.  She states that she has had no success with taking the preventive medications such as beta blockers, Topamax and Elavil in the past.  She has an appointment with Neurology within the next month.  2. Urinary frequency-patient tried the Flomax since her last appointment and in 1 day number had 23-26 episodes of urination in 1 day.  There has been no abdominal pain, dysuria or hematuria.    Objective:   BP 120/70  Pulse 68  Temp 36.3 C (97.4 F) (Oral)   Resp 16  Ht 1.727 m (5\' 8" )  Wt 86.8 kg (191 lb 6.4 oz)  BMI 29.1 kg/m2 Body mass index is 29.1 kg/(m^2).    No LMP recorded. Patient is postmenopausal.  GEN: NAD  LUNGS: CTA  B, anteriorly and posteriorly  HRT: RRR, neg murmur  EXT: No edema      [name], MD  [facility]  [address]  [address]  [place] [address]  Dept:  [phone]  Dept Fax: [phone]  Loc: [phone]  Loc Fax: [phone]

## 2016-11-06 NOTE — Nursing Note (Signed)
BP 120/70  Pulse 68  Temp 36.3 C (97.4 F) (Oral)   Resp 16  Ht 1.727 m (5\' 8" )  Wt 86.8 kg (191 lb 6.4 oz)  BMI 29.1 kg/m2  Mariana Single, LPN  0/02/4075, 80:88

## 2016-11-27 ENCOUNTER — Emergency Department (HOSPITAL_COMMUNITY): Payer: Medicare PPO

## 2016-11-27 ENCOUNTER — Encounter (HOSPITAL_COMMUNITY): Payer: Self-pay | Admitting: *Deleted

## 2016-11-27 ENCOUNTER — Emergency Department (HOSPITAL_COMMUNITY)
Admission: EM | Admit: 2016-11-27 | Discharge: 2016-11-28 | Disposition: A | Discharge status: Home / Self Care | Payer: Medicare PPO | Attending: Emergency Medicine | Admitting: Emergency Medicine

## 2016-11-27 DIAGNOSIS — K5900 Constipation, unspecified: Secondary | ICD-10-CM | POA: Diagnosis not present

## 2016-11-27 DIAGNOSIS — R1032 Left lower quadrant pain: Secondary | ICD-10-CM

## 2016-11-27 HISTORY — DX: Diverticulitis of intestine, part unspecified, without perforation or abscess without bleeding: K57.92

## 2016-11-27 HISTORY — DX: Migraine, unspecified, not intractable, without status migrainosus: G43.909

## 2016-11-27 LAB — COMPREHENSIVE METABOLIC PANEL
ALK PHOS: 40 U/L (ref 38–126)
ALT: 37 U/L (ref 14–54)
AST: 49 U/L — ABNORMAL HIGH (ref 15–41)
Albumin: 4 g/dL (ref 3.5–5.0)
Anion gap: 5 (ref 5–15)
BILIRUBIN TOTAL: 0.6 mg/dL (ref 0.3–1.2)
BUN: 18 mg/dL (ref 6–20)
CALCIUM: 9.2 mg/dL (ref 8.9–10.3)
CO2: 26 mmol/L (ref 22–32)
CREATININE: 0.67 mg/dL (ref 0.44–1.00)
Chloride: 108 mmol/L (ref 101–111)
Glucose, Bld: 86 mg/dL (ref 65–99)
Potassium: 4.4 mmol/L (ref 3.5–5.1)
Sodium: 139 mmol/L (ref 135–145)
TOTAL PROTEIN: 6.3 g/dL — AB (ref 6.5–8.1)

## 2016-11-27 LAB — URINALYSIS, ROUTINE W REFLEX MICROSCOPIC
Bilirubin Urine: NEGATIVE
Glucose, UA: NEGATIVE mg/dL
HGB URINE DIPSTICK: NEGATIVE
Ketones, ur: 5 mg/dL — AB
LEUKOCYTES UA: NEGATIVE
NITRITE: NEGATIVE
PROTEIN: NEGATIVE mg/dL
Specific Gravity, Urine: 1.008 (ref 1.005–1.030)
pH: 6 (ref 5.0–8.0)

## 2016-11-27 LAB — CBC
HCT: 36.5 % (ref 36.0–46.0)
Hemoglobin: 11.7 g/dL — ABNORMAL LOW (ref 12.0–15.0)
MCH: 30.7 pg (ref 26.0–34.0)
MCHC: 32.1 g/dL (ref 30.0–36.0)
MCV: 95.8 fL (ref 78.0–100.0)
PLATELETS: 166 10*3/uL (ref 150–400)
RBC: 3.81 MIL/uL — ABNORMAL LOW (ref 3.87–5.11)
RDW: 12.9 % (ref 11.5–15.5)
WBC: 6.2 10*3/uL (ref 4.0–10.5)

## 2016-11-27 LAB — LIPASE, BLOOD: Lipase: 23 U/L (ref 11–51)

## 2016-11-27 MED ORDER — MORPHINE SULFATE (PF) 4 MG/ML IV SOLN
4.0000 mg | Freq: Once | INTRAVENOUS | Status: AC
  Administered 2016-11-27: 4 mg via INTRAVENOUS
  Filled 2016-11-27: qty 1

## 2016-11-27 MED ORDER — IOPAMIDOL (ISOVUE-300) INJECTION 61%
INTRAVENOUS | Status: AC
  Administered 2016-11-27: 100 mL via INTRAVENOUS
  Filled 2016-11-27: qty 100

## 2016-11-27 MED ORDER — ONDANSETRON HCL 4 MG/2ML IJ SOLN
4.0000 mg | Freq: Once | INTRAMUSCULAR | Status: AC
  Administered 2016-11-27: 4 mg via INTRAVENOUS
  Filled 2016-11-27: qty 2

## 2016-11-27 MED ORDER — IOPAMIDOL (ISOVUE-300) INJECTION 61%
INTRAVENOUS | Status: AC
  Filled 2016-11-27: qty 100

## 2016-11-27 NOTE — ED Provider Notes (Signed)
[facility]-EMERGENCY DEPT Provider Note   CSN: [id number] Arrival date & time: 11/27/16  1424  History   Chief Complaint Chief Complaint  Patient presents with  . Abdominal Pain   HPI Destiny Hunter is a 66 y.o. female.  Patient reports a 6 day history of LLQ abdominal pain that is dull and throbbing.  She reports that this is constant and is worsened by eating.  She notes that her stomach seems distended when she eats.  She reports abdominal bloating.  She has been following a diverticulitis diet.  She notes she was seen at [facility] about 6 days ago and placed on Cipro 500mg  and Flagyl 500mg .  She reports diarrhea 3 days ago but notes that symptoms did not start with diarrhea.  She reports she has a h/o diverticulitis several years ago that felt similar but she notes that pain seems worse this time.  She cannot even sleep on her left side.  She reports that she has been taking OTC analgesics with little improvement in symptoms.  She reports pain with riding in the car and going over bumps.  She denies nausea, vomiting, blood in stool, diarrhea, constipation, fevers, chills.  She reports good PO tolerance.  Last BM today, it was normal.  She had a c-section in the past.  She still has appendix, gallbladder.   She brings in labs/ abdominal xray from UC appointment.  Labs unremarkable.  Abd film with some retained stool but otherwise no evidence of obstruction.     Past Medical History:  Diagnosis Date  . Diverticulitis   . Migraine     There are no active problems to display for this patient.   Past Surgical History:  Procedure Laterality Date  . CESAREAN SECTION      OB History    No data available       Home Medications    Prior to Admission medications   Not on File    Family History Family History  Problem Relation Age of Onset  . Heart attack Mother   . Colon cancer Mother   . Pancreatic cancer Father   . Colon cancer Father   . Thyroid cancer Sister   . Asthma  Brother     Social History Social History  Substance Use Topics  . Smoking status: Never Smoker  . Smokeless tobacco: Never Used  . Alcohol use No    Allergies   Patient has no known allergies.   Review of Systems Review of Systems  Constitutional: Positive for unexpected weight change (10 lb gain over the last week). Negative for appetite change and fever.  Respiratory: Negative for cough and shortness of breath.   Cardiovascular: Negative for chest pain.  Gastrointestinal: Positive for abdominal distention, abdominal pain and diarrhea (3 days ago that resolved). Negative for blood in stool, constipation, nausea and vomiting.  Genitourinary: Negative for difficulty urinating, dysuria, frequency, hematuria, vaginal bleeding and vaginal discharge.    Physical Exam Updated Vital Signs BP (!) 144/79 (BP Location: Right Arm)   Pulse 67   Temp 98.4 F (36.9 C) (Oral)   Resp 16   Ht 5\' 8"  (1.727 m)   Wt 88.9 kg (196 lb)   SpO2 100%   BMI 29.80 kg/m   Physical Exam  Constitutional: She is oriented to person, place, and time. She appears well-developed and well-nourished. No distress.  Accompanied by her husband  HENT:  Head: Normocephalic and atraumatic.  Eyes: Conjunctivae and EOM are normal. Pupils are equal,  round, and reactive to light. No scleral icterus.  Neck: Normal range of motion. Neck supple.  Cardiovascular: Normal rate, regular rhythm, normal heart sounds and intact distal pulses.   No murmur heard. Pulmonary/Chest: Effort normal and breath sounds normal. No respiratory distress. She has no wheezes.  Abdominal: Soft. Bowel sounds are normal. She exhibits no distension and no mass. There is tenderness (LLQ) in the left lower quadrant. There is no rigidity, no guarding, no tenderness at McBurney's point and negative Murphy's sign.  Negative obturator sign.  Musculoskeletal: Normal range of motion. She exhibits no edema.  Neurological: She is alert and oriented to  person, place, and time.  Skin: Skin is warm. Capillary refill takes less than 2 seconds. She is not diaphoretic.  Psychiatric: She has a normal mood and affect. Her behavior is normal. Judgment and thought content normal.  Nursing note and vitals reviewed.   ED Treatments / Results  Labs (all labs ordered are listed, but only abnormal results are displayed) Labs Reviewed  COMPREHENSIVE METABOLIC PANEL - Abnormal; Notable for the following:       Result Value   Total Protein 6.3 (*)    AST 49 (*)    All other components within normal limits  CBC - Abnormal; Notable for the following:    RBC 3.81 (*)    Hemoglobin 11.7 (*)    All other components within normal limits  LIPASE, BLOOD  URINALYSIS, ROUTINE W REFLEX MICROSCOPIC    EKG  EKG Interpretation None       Radiology No results found.  Procedures Procedures (including critical care time)  Medications Ordered in ED Medications - No data to display  Initial Impression / Assessment and Plan / ED Course  I have reviewed the triage vital signs and the nursing notes.  Pertinent labs & imaging results that were available during my care of the patient were reviewed by me and considered in my medical decision making (see chart for details).    2108: VSS, patient is afebrile.  AST mildly elevated.  No leukocytosis.  2121: Concern for persistent diverticulitis.  CT abd/ pelvis with contrast ordered.  2214: Awaiting CT.  Zofran IV and Morphine IV ordered.  RN at bedside with meds.  Patient stable.  NAD  Final Clinical Impressions(s) / ED Diagnoses   Final diagnoses:  None   Destiny Hunter is a 66 y.o. female that presents with a 6 day h/o LLQ abdominal pain.  Her exam was remarkable for LLQ tenderness.  No exam findings to suggest peritonitis.  Her CMP was remarkable for mild elevation in her AST.  She was given Morphine IV and Zofran in ED.  CT abd/pelvis ordered and was pending at shift change.  Patient's care was  discussed and transferred to Dr Clarene DukeLittle.  New Prescriptions New Prescriptions   No medications on file     Raliegh IpGottschalk, Kellon Chalk M, DO 11/27/16 2249    Little, Ambrose Finlandachel Morgan, MD 11/28/16 [phone]

## 2016-11-27 NOTE — ED Notes (Signed)
Attempted to gain IV access x2, without success.  

## 2016-11-27 NOTE — ED Notes (Signed)
Patient transported to CT 

## 2016-11-27 NOTE — ED Notes (Signed)
IV Team at bedside 

## 2016-11-27 NOTE — ED Triage Notes (Signed)
To ED for eval of llq pain for the past week. Seen at ucc and given flagyl and cipro for pts hx of diverticulitis. Taken for 6 days without relief. Pt states 'even my underwear hurt me'. No vomiting. No diarrhea. Normal stools- no blood.

## 2016-11-27 NOTE — ED Notes (Signed)
IV attempted. Nonsuccesseful

## 2016-11-28 NOTE — Discharge Instructions (Signed)
RETURN TO ER IF ANY FEVER, WORSENING PAIN, VOMITING, OR BLOODY STOOL.

## 2016-11-28 NOTE — ED Notes (Signed)
Pt departed in NAD, refused use of wheelchair.  

## 2016-12-21 ENCOUNTER — Encounter (INDEPENDENT_AMBULATORY_CARE_PROVIDER_SITE_OTHER): Payer: Self-pay | Admitting: Family Medicine

## 2016-12-21 ENCOUNTER — Ambulatory Visit (INDEPENDENT_AMBULATORY_CARE_PROVIDER_SITE_OTHER): Payer: Medicare Other | Admitting: Family Medicine

## 2016-12-21 VITALS — BP 118/76 | HR 62 | Temp 97.5°F | Resp 18 | Ht 68.0 in | Wt 196.6 lb

## 2016-12-21 DIAGNOSIS — Z6829 Body mass index (BMI) 29.0-29.9, adult: Secondary | ICD-10-CM

## 2016-12-21 DIAGNOSIS — K219 Gastro-esophageal reflux disease without esophagitis: Secondary | ICD-10-CM

## 2016-12-21 DIAGNOSIS — K5900 Constipation, unspecified: Secondary | ICD-10-CM

## 2016-12-21 MED ORDER — RANITIDINE 300 MG TABLET
300.00 mg | ORAL_TABLET | Freq: Every evening | ORAL | 3 refills | Status: DC
Start: 2016-12-21 — End: 2017-02-19

## 2016-12-21 NOTE — Progress Notes (Signed)
[facility]  [address]  [place] [address]     Outpatient Visit Note (Assment/Plan;Subjective;Objective)     Name: [name] MRN:  [id number]   Date: 12/21/2016 Age: 66 y.o.     Assessment & Plan:       ICD-10-CM    1. Gastroesophageal reflux disease, esophagitis presence not specified  Due to nocturnal sxs advised a trial of ranitidine at bedtime, discussed change in diet K21.9    2. Constipation, unspecified constipation type  Advised rectal suppository and senokot laxative or mag citrate  Advised patient to hold fiber supplements, discussed diet, check xray in 1 week K59.00 XR ABDOMEN, FLAT AND ERECT     Problem List Items Addressed This Visit     None      Visit Diagnoses     Gastroesophageal reflux disease, esophagitis presence not specified    -  Primary    Constipation, unspecified constipation type        Relevant Orders    XR ABDOMEN, FLAT AND ERECT        Orders Placed This Encounter    XR ABDOMEN, FLAT AND ERECT    raNITIdine (ZANTAC) 300 mg Oral Tablet     Current Outpatient Prescriptions   Medication Sig    albuterol sulfate (PROAIR HFA) 90 mcg/actuation Inhalation HFA Aerosol Inhaler Take 2 Puffs by inhalation Every 4 hours as needed (Patient not taking: Reported on 11/06/2016)    Benzonatate (TESSALON) 200 mg Oral Capsule Take 1 Cap (200 mg total) by mouth Three times a day as needed for Cough (Patient not taking: Reported on 12/21/2016)    EPINEPHrine 0.3 mg/0.3 mL Injection Auto-Injector 0.3 mL (0.3 mg total) by Intramuscular route Once, as needed for up to 1 dose    MELOXICAM ORAL Take by mouth    raNITIdine (ZANTAC) 300 mg Oral Tablet Take 1 Tab (300 mg total) by mouth Every evening    tamsulosin (FLOMAX) 0.4 mg Oral Capsule, Sust. Release 24 hr Take 1 Cap (0.4 mg total) by mouth Every evening after dinner (Patient not taking: Reported on 12/21/2016)    Zolmitriptan (ZOMIG) 5 mg Oral Tablet Take at onset of headache, may repeat in 2 hours if needed.  Max  2/d, max 3d/wk               Follow up: 2 weeks    Subjective:   CC:   Chief Complaint   Patient presents with    Acid Reflux     [name] is a 66 y.o. female here for Acid Reflux   Patient states that she has noticed her throat is sore mostly at nighttime.  She feels this is due to acid reflux.  The symptoms are are better during the day.  Her last colonoscopy and EGD was done a year ago and was normal.  She had been visiting her daughter in [place] last week and developed extensive left lower quadrant abdominal pain.  She went to urgent care twice in the ED once and was diagnosed with extensive constipation.  There was no diverticulitis.  She states 1 month prior that she had been drinking protein drinks regularly for energy and is taking 2 different types of fiber supplements.  Patient also admits to eating diet with lots of bananas, dairy and nuts. She was advised to take MiraLax and states that she broke out in a rash however she had also been placed on ciprofloxacin and Flagyl.  Objective:   BP 118/76   Pulse 62   Temp 36.4 C (97.5 F) (Oral)    Resp 18   Ht 1.727 m (5\' 8" )   Wt 89.2 kg (196 lb 9.6 oz)   BMI 29.89 kg/m2 Body mass index is 29.89 kg/(m^2).    No LMP recorded. Patient is postmenopausal.  GEN: NAD  LUNGS: CTA  B, anteriorly and posteriorly  HRT: RRR, neg murmur  ABD: normal bowel sounds, soft, tender ,LLQ fullness, nondistended, no hepatosplenomegaly  EXT: No edema      [name], MD  [facility]  [address]  [place] [address]  Dept: [phone]  Dept Fax: [phone]  Loc: [phone]  Loc Fax: [phone]

## 2016-12-21 NOTE — Patient Instructions (Signed)
How Acid Reflux Affects Your Throat    Do you have to clear your throat or cough often? Are you hoarse? Do you have trouble swallowing? If you have these or other throat symptoms, you may haveacid reflux. This occurs when stomach acid flows back up and irritates your throat.  Why you have throat symptoms  There are muscles (esophageal sphincters) at both ends of the tube that carries food to your stomach (the esophagus).These muscles relax to let food pass. Then they tighten to keep stomach acid down. When the lower esophageal sphincter(LES)doesn't tighten enough, acid can flow back (reflux) from your stomach into your esophagus. This may cause heartburn. In some cases the upper esophageal sphincter(UES)also doesn't work well. Then acid can travel higher and enter your throat(pharynx).In many cases, this causes throat symptoms.  Common throat symptoms   Need to clear your throat often   Feeling like you're choking   Long-term (chronic) cough   Hoarseness   Trouble swallowing   Feel like you have a lump in your throat   Sour or acid taste   Sore throat that keeps coming back   Date Last Reviewed: 12/04/2014   2000-2017 [employer]. [address] [place] [address]. All rights reserved. This information is not intended as a substitute for professional medical care. Always follow your healthcare professional's instructions.        Tips to Control Acid Reflux    To control acid reflux, you'll need to make some basic diet and lifestyle changes. The simple steps outlined below may be all you'll need to ease discomfort.  Watch what you eat   Avoid fatty foods and spicy foods.   Eat fewer acidic foods, such as citrus and tomato-based foods. These can increase symptoms.   Limit drinking alcohol, caffeine, and fizzy beverages. All increase acid reflux.   Try limiting chocolate, peppermint, and spearmint.These can worsen acid reflux in some people.  Watch when you eat   Avoid lying  down for3hours after eating.   Do not snack before going to bed.  Raise your head  Raising your head and upper body by 4 to 6 incheshelps limit reflux when you're lying down. Put blocks under the head of your bed frame to raise it.  Other changes   Lose weight, if you need to   Don't exercise near bedtime   Avoid tight-fitting clothes   Limit aspirin and ibuprofen   Stop smoking   Date Last Reviewed: 12/04/2014   2000-2017 [employer]. [address], [place] [address]. All rights reserved. This information is not intended as a substitute for professional medical care. Always follow your healthcare professional's instructions.

## 2016-12-21 NOTE — Nursing Note (Signed)
Chief Complaint:   Acid Reflux    Functional Health Screen        Vital Signs  BP 118/76  Pulse 62  Temp 36.4 C (97.5 F) (Oral)   Resp 18  Ht 1.727 m (5\' 8" )  Wt 89.2 kg (196 lb 9.6 oz)  BMI 29.89 kg/m2  History   Smoking Status   . Never Smoker   Smokeless Tobacco   . Never Used     Allergies  Allergies   Allergen Reactions   . Percocet [Oxycodone-Acetaminophen] Nausea/ Vomiting   . Beesting [Hymenoptera Allergenic Extract]      Medication History  Reviewed for OTC medication and any new medications, provider will review medication history  Care Team  Patient Care Team:  [name], MD as PCP - [facility])  Britt Bottom, MD ([facility]-ONCOLOG[facility])  Immunizations - last 24 hours     None        There are no exam notes on file for this visit.  Ivory Broad, MA  12/21/2016, 13:28

## 2016-12-25 ENCOUNTER — Telehealth (INDEPENDENT_AMBULATORY_CARE_PROVIDER_SITE_OTHER): Payer: Self-pay | Admitting: Family Medicine

## 2016-12-25 ENCOUNTER — Other Ambulatory Visit (INDEPENDENT_AMBULATORY_CARE_PROVIDER_SITE_OTHER): Payer: Self-pay | Admitting: Specialist

## 2016-12-25 ENCOUNTER — Ambulatory Visit (HOSPITAL_BASED_OUTPATIENT_CLINIC_OR_DEPARTMENT_OTHER)
Admission: RE | Admit: 2016-12-25 | Discharge: 2016-12-25 | Disposition: A | Discharge status: Home / Self Care | Payer: Medicare Other | Source: Ambulatory Visit | Attending: Family Medicine | Admitting: Family Medicine

## 2016-12-25 DIAGNOSIS — K5900 Constipation, unspecified: Secondary | ICD-10-CM | POA: Insufficient documentation

## 2016-12-25 NOTE — Telephone Encounter (Signed)
Review erx

## 2016-12-25 NOTE — Telephone Encounter (Signed)
Notified pt that xray looked ok with no signs of excessive stool. Patient will try and obtain report of CT that she had in [place].  Ivory Broad, MA  12/25/2016, 13:50

## 2016-12-25 NOTE — Telephone Encounter (Signed)
Xray looked ok.  No signs of excessive stool.  Would like to see CT SCAN report that she had at the other hopsital

## 2017-01-08 ENCOUNTER — Ambulatory Visit (INDEPENDENT_AMBULATORY_CARE_PROVIDER_SITE_OTHER): Payer: Medicare Other | Admitting: Family Medicine

## 2017-01-08 ENCOUNTER — Encounter (INDEPENDENT_AMBULATORY_CARE_PROVIDER_SITE_OTHER): Payer: Self-pay | Admitting: Family Medicine

## 2017-01-08 VITALS — BP 118/64 | HR 74 | Temp 98.3°F | Resp 18 | Ht 68.0 in | Wt 193.0 lb

## 2017-01-08 DIAGNOSIS — K5900 Constipation, unspecified: Secondary | ICD-10-CM

## 2017-01-08 DIAGNOSIS — Z6829 Body mass index (BMI) 29.0-29.9, adult: Secondary | ICD-10-CM

## 2017-01-08 NOTE — Progress Notes (Signed)
[facility] PR[facility], [facility]  [address]  [place] [facility] [id number]     Outpatient Visit Note (Assment/Plan;Subjective;Objective)     Name: [name] MRN:  [id number]   Date: 01/08/2017 Age: 66 y.o.     Assessment & Plan:       ICD-10-CM    1. Constipation, unspecified constipation type  Improved with change in diet and stool softeners , if LLQ tenderness returns will advise a f/u with GI K59.00      Problem List Items Addressed This Visit     None      Visit Diagnoses     Constipation, unspecified constipation type    -  Primary        No orders of the defined types were placed in this encounter.    Current Outpatient Prescriptions   Medication Sig   . albuterol sulfate (PROAIR HFA) 90 mcg/actuation Inhalation HFA Aerosol Inhaler Take 2 Puffs by inhalation Every 4 hours as needed (Patient not taking: Reported on 11/06/2016)   . Benzonatate (TESSALON) 200 mg Oral Capsule Take 1 Cap (200 mg total) by mouth Three times a day as needed for Cough (Patient not taking: Reported on 12/21/2016)   . EPINEPHrine 0.3 mg/0.3 mL Injection Auto-Injector 0.3 mL (0.3 mg total) by Intramuscular route Once, as needed for up to 1 dose   . MELOXICAM ORAL Take by mouth   . raNITIdine (ZANTAC) 300 mg Oral Tablet Take 1 Tab (300 mg total) by mouth Every evening   . tamsulosin (FLOMAX) 0.4 mg Oral Capsule, Sust. Release 24 hr Take 1 Cap (0.4 mg total) by mouth Every evening after dinner (Patient not taking: Reported on 12/21/2016)   . Zolmitriptan (ZOMIG) 5 mg Oral Tablet TAKE AT ONSET OF HEADACHE, MAY REPEAT IN 2 HOURS IF NEEDED. MAX OF 2 PER DAY MAX OF 3 DAYS PER WEEK               Follow up: prn    Subjective:   CC:   Chief Complaint   Patient presents with   . Constipation     [name] is a 66 y.o. female here for Constipation   Patient states that she slowly started having daily bowel movements.  She states that she had no response initially with Senokot or Fleet enemas.  She did start taking over-the-counter  stool softeners and this seems to help.  She denies any blood in her stools or persistent left lower quadrant pain. She has not an upcoming appointment with Urogynecology due to her frequent nocturia.  She she reports having a migraine last week that lasted for several days.  She states she has not tried taking  an anti inflammatory with the zomig.    Objective:   BP 118/64  Pulse 74  Temp 36.8 C (98.3 F) (Oral)   Resp 18  Ht 1.727 m (5\' 8" )  Wt 87.5 kg (193 lb)  BMI 29.35 kg/m2 Body mass index is 29.35 kg/(m^2).    No LMP recorded. Patient is postmenopausal.  GEN: NAD  LUNGS: CTA  B, anteriorly and posteriorly  HRT: RRR, neg murmur  ABD: normal bowel sounds, soft, nontender, nondistended, no hepatosplenomegaly  EXT: No edema      [name], MD  [place], [facility]  [address]  [place] [address]  Dept: [phone]  Dept Fax: [phone]  Loc: [phone]  Loc Fax: [phone]

## 2017-01-10 ENCOUNTER — Other Ambulatory Visit (HOSPITAL_BASED_OUTPATIENT_CLINIC_OR_DEPARTMENT_OTHER): Payer: Self-pay

## 2017-01-12 ENCOUNTER — Other Ambulatory Visit (HOSPITAL_BASED_OUTPATIENT_CLINIC_OR_DEPARTMENT_OTHER): Payer: Self-pay

## 2017-01-12 ENCOUNTER — Encounter (HOSPITAL_BASED_OUTPATIENT_CLINIC_OR_DEPARTMENT_OTHER): Payer: Self-pay

## 2017-01-12 ENCOUNTER — Ambulatory Visit (HOSPITAL_BASED_OUTPATIENT_CLINIC_OR_DEPARTMENT_OTHER)
Admission: RE | Admit: 2017-01-12 | Discharge: 2017-01-12 | Disposition: A | Discharge status: Home / Self Care | Payer: Medicare Other | Source: Ambulatory Visit | Attending: Family Medicine | Admitting: Family Medicine

## 2017-01-12 ENCOUNTER — Other Ambulatory Visit (INDEPENDENT_AMBULATORY_CARE_PROVIDER_SITE_OTHER): Payer: Self-pay | Admitting: Family Medicine

## 2017-01-12 DIAGNOSIS — R928 Other abnormal and inconclusive findings on diagnostic imaging of breast: Secondary | ICD-10-CM

## 2017-01-12 DIAGNOSIS — N6312 Unspecified lump in the right breast, upper inner quadrant: Secondary | ICD-10-CM | POA: Insufficient documentation

## 2017-01-12 DIAGNOSIS — Z1231 Encounter for screening mammogram for malignant neoplasm of breast: Secondary | ICD-10-CM | POA: Insufficient documentation

## 2017-01-15 ENCOUNTER — Telehealth (INDEPENDENT_AMBULATORY_CARE_PROVIDER_SITE_OTHER): Payer: Self-pay | Admitting: Family Medicine

## 2017-01-15 NOTE — Telephone Encounter (Signed)
It is a benign intramammary lymphnode

## 2017-01-15 NOTE — Telephone Encounter (Signed)
Patient asking for ultrasound of breast result, patient is going out of town this week.

## 2017-01-16 NOTE — Telephone Encounter (Signed)
Notified patient of breast US results.  Ivory Broad, MA  01/16/2017, 09:51

## 2017-02-19 ENCOUNTER — Ambulatory Visit
Admission: RE | Admit: 2017-02-19 | Discharge: 2017-02-19 | Disposition: A | Discharge status: Home / Self Care | Payer: Medicare Other | Source: Ambulatory Visit | Attending: Family | Admitting: Family

## 2017-02-19 ENCOUNTER — Encounter (INDEPENDENT_AMBULATORY_CARE_PROVIDER_SITE_OTHER): Payer: Self-pay | Admitting: Family

## 2017-02-19 ENCOUNTER — Encounter (INDEPENDENT_AMBULATORY_CARE_PROVIDER_SITE_OTHER): Payer: Medicare Other | Admitting: Specialist

## 2017-02-19 ENCOUNTER — Ambulatory Visit (HOSPITAL_BASED_OUTPATIENT_CLINIC_OR_DEPARTMENT_OTHER): Payer: Medicare Other | Admitting: Family

## 2017-02-19 VITALS — BP 110/62 | HR 52 | Ht 67.87 in | Wt 192.5 lb

## 2017-02-19 DIAGNOSIS — G479 Sleep disorder, unspecified: Secondary | ICD-10-CM

## 2017-02-19 DIAGNOSIS — Z79899 Other long term (current) drug therapy: Secondary | ICD-10-CM | POA: Insufficient documentation

## 2017-02-19 DIAGNOSIS — G43719 Chronic migraine without aura, intractable, without status migrainosus: Secondary | ICD-10-CM | POA: Insufficient documentation

## 2017-02-19 DIAGNOSIS — Z6828 Body mass index (BMI) 28.0-28.9, adult: Secondary | ICD-10-CM

## 2017-02-19 DIAGNOSIS — G629 Polyneuropathy, unspecified: Secondary | ICD-10-CM | POA: Insufficient documentation

## 2017-02-19 NOTE — Progress Notes (Signed)
RE:  [name]          12-31-1950    Dear Nicole Baron, MD:    I had the pleasure of seeing [name] in follow-up at the [place] on 02/19/2017.  Today she reports that her headaches have not changed.  She is currently taking nothing for prevention and using zolmitriptan as needed for her headaches.  Severe Headaches: these vary, with facial paralysis, nausea.  Total Headaches: 15 per month  Headache Free Days: 15 per month  Headache Description: unilateral in the left frontal area, unilateral in the left temporal area, unilateral in the left occipital area described as throbbing.  Rated as 8/10.  Response to abortive medications: Good - this does not get rid of the headache completely but does make it more tolerable.  Side effects to medications: none  Results of previous testing:     Current Outpatient Prescriptions   Medication Sig Dispense Refill   . EPINEPHrine 0.3 mg/0.3 mL Injection Auto-Injector 0.3 mL (0.3 mg total) by Intramuscular route Once, as needed for up to 1 dose 2 Each 2   . MELOXICAM ORAL Take by mouth     . Zolmitriptan (ZOMIG) 5 mg Oral Tablet TAKE AT ONSET OF HEADACHE, MAY REPEAT IN 2 HOURS IF NEEDED. MAX OF 2 PER DAY MAX OF 3 DAYS PER WEEK 9 Tab 11     No current facility-administered medications for this visit.         Past medical history, family history, and social history have been reviewed and confirmed.    Sleep:trouble staying asleep, she gets up a lot to go to the bathroom.  Mood:Euthymic    Vitals:    02/19/17 1035   BP: 110/62   Pulse: 52   SpO2: 100%   Weight: 87.3 kg (192 lb 7.4 oz)   Height: 1.724 m (5' 7.87")   BMI: 29.43       General: appears in good health  Orientation: Alert and oriented x 3  Memory: Registration, Recall, and Following of commands is normal  Attention: Attention and Concentration are normal  Knowledge: Good  Language: Normal  Speech: Normal    Cranial Nerves:  2-12 Normal   Gait Normal  Coordination Normal     Motor  Normal strength throughout        ICD-10-CM    1. 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  She will consider Aimovig but wants to see what results other patients are having first.  She will continue zomig as needed at this time.     The patient will follow up with me in 4-6 months or sooner if needed.    The patient was seen independently with the co-signing faculty present in clinic.    Jodi Geralds, APRN,FNP-BC 02/19/2017, 11:11  [place]

## 2017-03-31 ENCOUNTER — Encounter (INDEPENDENT_AMBULATORY_CARE_PROVIDER_SITE_OTHER): Payer: Self-pay | Admitting: Family

## 2017-03-31 ENCOUNTER — Ambulatory Visit (INDEPENDENT_AMBULATORY_CARE_PROVIDER_SITE_OTHER): Payer: Medicare Other | Admitting: Family

## 2017-03-31 ENCOUNTER — Other Ambulatory Visit: Payer: Medicare Other | Attending: Family

## 2017-03-31 VITALS — BP 119/82 | HR 87 | Temp 97.8°F | Ht 67.5 in | Wt 187.9 lb

## 2017-03-31 DIAGNOSIS — R309 Painful micturition, unspecified: Secondary | ICD-10-CM

## 2017-03-31 DIAGNOSIS — R319 Hematuria, unspecified: Principal | ICD-10-CM

## 2017-03-31 DIAGNOSIS — N390 Urinary tract infection, site not specified: Principal | ICD-10-CM

## 2017-03-31 DIAGNOSIS — Z6828 Body mass index (BMI) 28.0-28.9, adult: Secondary | ICD-10-CM

## 2017-03-31 MED ORDER — CEPHALEXIN 500 MG CAPSULE: 500 mg | Cap | Freq: Two times a day (BID) | ORAL | 0 refills | 0 days | Status: AC

## 2017-03-31 NOTE — Progress Notes (Signed)
URGENT CARE, [place]  [address] [place] [address]       Name: [name] MRN:  [id number]   Date: 03/31/2017 Age: 66 y.o.       Chief Complaint:    Chief Complaint     Painful Urination     Abdominal Pain               SUBJECTIVE: [name] is a 66 y.o. female who complains of hematuria, malodorous urine, dysuria, hesitancy, urinary frequency and urgency x 3 days. Hx of chronic urge incontinence. Was seen last week at [facility] by a GYNURO specialist. Pt underwent a "bladder test" and then was instructed to start self cathing twice per day. Symptoms started the night after the "bladder testing" prior to the patient starting to self cath.  Denies fever, chills, sweats, abdominal pain, nausea, emesis, diarrhea, cva tenderness, vaginal bleeding, itching,  or discharge. Denies cp or sob. Has taken no otc medication.     ROS:  Constitutional: Denies fevers, chills, or sweats  Cardiovascular: Denies chest pain   Respiratory: Denies shortness of breath  GI: Denies abdominal pain, nausea, vomiting, or diarrhea   GU: Reports dysuria, frequency, hematuria, malodorous urine, hesitancy, and urgency,  hx of chronic incontinence,  denies cva tenderness, vaginal discharge, bleeding, lesions or itching      Past Medical History  Current Outpatient Prescriptions   Medication Sig   . cephalexin (KEFLEX) 500 mg Oral Capsule Take 1 Cap (500 mg total) by mouth Twice daily for 7 days   . EPINEPHrine 0.3 mg/0.3 mL Injection Auto-Injector 0.3 mL (0.3 mg total) by Intramuscular route Once, as needed for up to 1 dose   . MELOXICAM ORAL Take by mouth   . Zolmitriptan (ZOMIG) 5 mg Oral Tablet TAKE AT ONSET OF HEADACHE, MAY REPEAT IN 2 HOURS IF NEEDED. MAX OF 2 PER DAY MAX OF 3 DAYS PER WEEK     Allergies   Allergen Reactions   . Percocet [Oxycodone-Acetaminophen] Nausea/ Vomiting   . Beesting [Hymenoptera Allergenic Extract]    . Procaine  Other Adverse Reaction (Add comment)     Past Medical History:    Diagnosis Date   . Breast lump 2005    Resolved prior to biopsy   . Detached retina    . Esophageal reflux    . Hx of migraines    . Iron deficiency anemia    . LLQ abdominal pain    . Migraine    . Osteoporosis    . Pancytopenia    . Prolapsed uterus          Past Surgical History:   Procedure Laterality Date   . HX BREAST BIOPSY Bilateral     benign    . HX CESAREAN SECTION  1993   . HX COLONOSCOPY     . HX ENDOSCOPIC SINUS SURGERY     . HX TONSILLECTOMY  1955         Family Medical History:     Problem Relation (Age of Onset)    Asthma Mother, Sister, Brother    Colon Cancer Mother, Father    Congestive Heart Failure Mother    Diabetes Father    No Known Problems Daughter, Maternal Grandmother, Maternal Grandfather, Paternal 32, Paternal Grandfather, Son, Maternal Aunt, Maternal Uncle, Paternal 75, Paternal Uncle, Other            Social History     Social History   . Marital  status: Married     Spouse name: N/A   . Number of children: N/A   . Years of education: N/A     Occupational History   . 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 Smoking status: Never Smoker   . Smokeless tobacco: Never Used   . Alcohol use No   . Drug use: No   . Sexual activity: Yes     Partners: Male     Other Topics Concern   . Not on file     Social History Narrative       OBJECTIVE:    height is 1.715 m (5' 7.5") and weight is 85.2 kg (187 lb 14.4 oz). Her oral temperature is 36.6 C (97.8 F). Her blood pressure is 119/82 and her pulse is 87. Her oxygen saturation is 98%.     Physical Exam:   General: Alert and oriented x 3, no apparent acute distress, pleasant, vss  Cardiovascular: Normal rate and regular rhythm, S1, S2  Lungs: Clear to auscultation bilaterally  Abdomen: Soft, non-tender, no rebound or guarding, no CVA tenderness   Extremities: No peripheral edema, radial pulses regular  Gu: Urine dip stick positive, no cva tenderness,  Skin: Warm and dry, no rash    Urine Dip  Results:  Glucose: Negative  Bilirubin: (!) Small  Ketones: Large (80 mg/dl)  Urine Specific Gravity: 1.025  Blood (urine): Large (3+)  Protein: (!) 3+ (300mg /dL)  Urobilinogen: 1.0 mg/dL  Nitrite: Negative  Leukocytes: (!) Large    ASSESSMENT and PLAN:  1. Urinary tract infection with hematuria, site unspecified  Push fluids  Reviewed proper hygiene to prevent urinary tract infections  Reviewed proper hygiene for self cathing  Complete all antbx as prescribed  Will contact the patient after sensitivity review if antbx needs adjusted  Go to the emergency room if symptoms worsen  Contact urologist regarding this current illness, pt agrees to call Monday  - cephalexin (KEFLEX) 500 mg Oral Capsule; Take 1 Cap (500 mg total) by mouth Twice daily for 7 days  Dispense: 14 Cap; Refill: 0    2. Painful urination  - POCT URINE DIPSTICK  - URINE CULTURE; Future      Follow up: PRN or if symptoms fail to improve or worsen.  See your primary care provider, return to urgent care or be seen at the emergency room if symptoms worsen. Contact urologist regarding current illness, pt agrees.     Louie Casa, NP-C    Supervising Physician: Dr. Lazaro Arms    Portions of this note may be dictated using voice recognition software.   Variances in spelling and vocabulary are possible and unintentional. Not all errors are caught/corrected. Please notify the [facility] if any discrepancies are noted or if the meaning of any statement is not clear.

## 2017-03-31 NOTE — Nursing Note (Signed)
BP 119/82  Pulse 87  Temp 36.6 C (97.8 F) (Oral)   Ht 1.715 m (5' 7.5")  Wt 85.2 kg (187 lb 14.4 oz)  SpO2 98%  BMI 28.99 kg/m2  Isidoro Donning, RTR  03/31/2017, 16:54

## 2017-03-31 NOTE — Patient Instructions (Addendum)
[address], [place]  [place] [address]  Phone: [phone]  Fax: [phone]           Open Daily 8:00am - 8:00pm, except Sundays 12pm-8pm         ~ Closed Thanksgiving and Christmas Day     Attending Caregiver: Danae Orleans, NP    Today's orders:   Orders Placed This Encounter   . URINE CULTURE   . POCT URINE DIPSTICK   . cephalexin (KEFLEX) 500 mg Oral Capsule        Prescription(s) E-Rx to:  [place], [address].    ________________________________________________________________________  Short Term Disability and [facility] does NOT provide assistance with any disability applications.  If you feel your medical condition requires you to be on disability, you will need to follow up with  Your primary care physician or a specialist.  We apologize for any inconvenience.    For Medication Prescribed by [facility]:  As an Urgent Care facility, our clinic does NOT offer prescription refills over the telephone.    If you need more of the medication one of our medical providers prescribed, you will  Either need to be re-evaluated by [place] or see your primary care physician.    ________________________________________________________________________      It is very important that we have a phone number that is the single best way to contact you in the event that we become aware of important clinical information or concerns after your discharge.  If the phone number you provided at registration is NOT this number you should inform staff and registration prior to leaving.      Your treatment and evaluation today was focused on identifying and treating potentially emergent conditions based on your presenting signs, symptoms, and history.  The resulting initial clinical impression and treatment plan is not intended to be definitive or a substitute for a full physical examination and evaluation by your  primary care provider.  If your symptoms persist, worsen, or you develop any new or concerning symptoms, you need to be evaluated.      If you received x-rays during your visit, be aware that the final and formal interpretation of those films by a radiologist may occur after your discharge.  If there is a significant discrepancy identified after your discharge, we will contact you at the telephone number provided at registration.      If you received a pelvic exam, you may have cultures pending for sexually transmitted diseases.  Positive cultures are reported to the [place] Department of Health as required by state law.  You should be contacted if you cultures are positive.  We will not contact you if they are negative.  You did NOT receive a PAP smear (the screening test for cervical).  This specific test for women is best performed by your gynecologist or primary care provider when indicated.      If you are over 68 year old, we cannot discuss your personal health information with a parent, spouse, family member, or anyone else without your express consent.  This does not include those who have legitimate access to your records and information to assist in your care under the provisions of HIPAA ([place] and [place]) law, or those to whom you have previously given express written consent to do so, such a legal guardian or Power of  Attorney.      You may have received medication that may cause you to feel drowsy and/or light headed for several hours.  You may even experience some amnesia of your stay.  You should avoid operating a motor vehicle or performing any activity requiring complete alertness or coordination until you feel fully awake (approximately 24-48 hours).  Avoid alcoholic beverages.  You may also have a dry mouth for several hours.  This is a normal side effect and will disappear as the effects of the medication wear off.      Instructions discussed with patient upon  discharge by clinical staff with all questions answered.  Please call [facility] [phone] if any further questions.  Go immediately to the emergency department if any concern or worsening symptoms.    Danae Orleans, NP 03/31/2017, 17:23

## 2017-03-31 NOTE — Nursing Note (Signed)
03/31/17 1705   Urine   Glucose Negative   Bilirubin (!) 1+   Ketones Large (80 mg/dl)   Urine Specific Gravity 1.025   Blood (urine) Large (3+)   pH 7.0   Protein (!) 3+ (300mg /dL)   Urobilinogen 1.0 mg/dL   Nitrite Negative   Leukocytes (!) 3+   Initials RS

## 2017-04-01 ENCOUNTER — Encounter (INDEPENDENT_AMBULATORY_CARE_PROVIDER_SITE_OTHER): Payer: Self-pay

## 2017-04-01 NOTE — Progress Notes (Signed)
Preliminary - follow  Rx: Keflex

## 2017-04-01 NOTE — Nursing Note (Signed)
Attempted to call pt to follow up. No answer, left message.         Isidoro Donning, RTR  04/01/2017, 16:22

## 2017-04-02 NOTE — Progress Notes (Signed)
Attached is a copy of the urine culture completed in urgent care.   Culture resulted E-coli.  The antibiotic prescribed covered the infection.  If you are still having symptoms please see your urologist.

## 2017-04-12 ENCOUNTER — Other Ambulatory Visit (HOSPITAL_BASED_OUTPATIENT_CLINIC_OR_DEPARTMENT_OTHER): Payer: Self-pay | Admitting: Orthopaedic Surgery

## 2017-04-12 DIAGNOSIS — M7541 Impingement syndrome of right shoulder: Secondary | ICD-10-CM

## 2017-04-12 DIAGNOSIS — G8929 Other chronic pain: Principal | ICD-10-CM

## 2017-04-12 DIAGNOSIS — M533 Sacrococcygeal disorders, not elsewhere classified: Secondary | ICD-10-CM

## 2017-04-21 ENCOUNTER — Other Ambulatory Visit: Payer: Self-pay

## 2017-04-23 ENCOUNTER — Ambulatory Visit (HOSPITAL_BASED_OUTPATIENT_CLINIC_OR_DEPARTMENT_OTHER): Payer: Self-pay

## 2017-04-24 ENCOUNTER — Ambulatory Visit (HOSPITAL_BASED_OUTPATIENT_CLINIC_OR_DEPARTMENT_OTHER): Payer: Medicare Other

## 2017-04-30 ENCOUNTER — Ambulatory Visit (HOSPITAL_BASED_OUTPATIENT_CLINIC_OR_DEPARTMENT_OTHER)
Admission: RE | Admit: 2017-04-30 | Discharge: 2017-04-30 | Disposition: A | Discharge status: Still In Hospital | Payer: Medicare Other | Source: Ambulatory Visit

## 2017-04-30 DIAGNOSIS — M7541 Impingement syndrome of right shoulder: Secondary | ICD-10-CM | POA: Insufficient documentation

## 2017-04-30 DIAGNOSIS — M533 Sacrococcygeal disorders, not elsewhere classified: Secondary | ICD-10-CM | POA: Insufficient documentation

## 2017-04-30 DIAGNOSIS — G8929 Other chronic pain: Secondary | ICD-10-CM | POA: Insufficient documentation

## 2017-04-30 NOTE — PT Treatment (Signed)
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 2014, patient had a fall and broke her right elbow and at the same time hurt her knees and back.  Right shoulder pain complaints at least for the past year.  Pt recently had an injection at right shoulder and knee by Dr Hervey Ard. The injection helped her knee, but not her shoulder.   She is having trouble moving in bed due to placing weight through UE.  She is left handed.   F/U with MD 05/07/17.  She is having difficulty with ADL's, ambulation, driving, transfers.  Intense cramping at B calves.    Objective:     Therex/HEP as follows for sacroiliitis and right shoulder impingement:    x25 min,    x5-10 reps  UBE--add  Ladder climb--add  B shoulder flexion stretch with hands on counter  B Codmans circles:   2#  Seated inferior GH mob of right shoulder with UT stretch  Seated inferior GH mob of right shoulder with UT stretch--look toward armpit  Scapular retractions  Standing GSC stretch (try slant board)  ITB stretch in doorframe--add  Pulleys--add  Seated B piriformis stretch--add    Supine B shoulder flexion stretch with cane--add    Supine right shoulder ER stretch with cane--add     **Patient given written copy of all ex's with pics for HEP  Assessment:     She demonstrated all ex's appropriately with pain at right shoulder with most ex's.   Poor right shoulder mobility noted in all planes with radicular complaints that may need to be evaluated for c-spine involvement.   Discussed importance of compliance with HEP.   She may benefit from further PT to address ROM/flexibility and  strength deficits along with pain/radicular complaints, muscular tightness/tenderness for increased ease of ADL's.  Plan:     Con't with POC 3x/week x6 weeks to address above noted deficits for increased ease of ADL's.       Total Timed Treatment Minutes:    25  Total Treatment Time:     66 (eval, therex)

## 2017-04-30 NOTE — PT Evaluation (Signed)
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  2014, patient had a fall and broke her right elbow and at the same time hurt her knees and back.  Right shoulder pain complaints at least for the past year.  Pt recently had an injection at right shoulder and knee by Dr Hervey Ard. The injection helped her knee, but not her shoulder.   She is having trouble moving in bed due to placing weight through UE.  She is left handed.   F/U with MD 05/07/17.  She is having difficulty with ADL's, ambulation, driving, transfers.  Intense cramping at B calves.    PMH:     Arthritis, HA's/migraines, congential issues with bladder, back pain, osteoporosis, urge incontinence  Social:     Married.    Retired.  Imaging Studies Performed:     X-ray of knee:   + arthritis  Medications:     Meloxicam, Aleve,  Migraine med, Vitamins-multiple  ROM:     Trunk/L-spine:   Flexion=minimally limited,  Extension=moderately limited with LBP,  B rot=minimally limited,   B SB=moderately limited with LBP.  Right shoulder:    Flexion=81,    Extension=56    Abduction=94,   IR=T10    ER=T1  Left shoulder:   Flexion=137,    Extension=65    Abduction=133   IR=T8,    ER=T2  Strength:     Right LE grossly 4/5  Left LE grossly 4 to 4+/5  RUE shoulder grossly 4-/5  LUE shoulder grossly 4 to 4+/5  Pain:  6/10--right shoulder and lateral hip  Better:   Meloxicam slightly,   Worse:  General use of right shoulder for ADL's and LBP with some ADL's.                                   Girth:     Right knee swelling at lateral knee--occasional   Sensation:     Numbness/tingling at B feet.  Gait:     She is ambulating without AD on all surfaces for community distances with some LBP for longer distance ambulation.   Posture:     FH, bilateral rounded shoulders, flexed posture.  In standing: right iliac crest and PSIS slightly higher than left in standing.   Palpation:     Tender to palpation at B SI joint (right > left).  Muscular tightness noted at B lumbar paraspinals (right > left).    Tender at right anterior GH joint at [facility] joint and along biceps tendon in bicipital groove.   Other:     Tight B piriformis, HS, ITB and GSC musculature.  Decreased sacral P/A mobility with 50% restriction noted.  Positive right shoulder impingement signs and symptoms.   RUE radicular complaints with  traction of RUE and cervical distraction.    Assessment:  66 y/o female with chronic history of LBP with sacroiliitis and right shoulder impingement signs and symptoms.  2014 patient had a fall injuring her knee and fracturing her elbow.   She feels that some of her current symptoms may be coming from this accident.   Patient may benefit from further PT to address the following problems:   Restricted ROM/flexibility   Decreased strength   Decreased functional activities   Loss of endurance   Pain limiting function   Right shoulder impingement signs and symptoms    Decreased sacral mobility with tight musculature as noted above   Radicular complaints    STGs:  (2 weeks):     1.  Patient will be independent with HEP for ROM/flexibility and strengthening for increased ease of ADLs.     LTGs:  (6 weeks):  Pt will:  1.  Increase trunk/L-spine ROM to minimal to no limitations in all planes for increased ease of ADL's and overall functional mobility.  2.  Improved sacral P/A mobility to 0-25% limitation for increased ease of ADL's and overall functional mobility.  3.  Decrease c/o pain  at worst to 3/10 for increased ease of ADL's and overall functional mobility.   4.  Increase BLE strength to 4 to 4+/5 for increased ease of ambulation/functional mobility.   5.  Increase right shoulder ROM to at least equal left for increased ease of ADL's.   6.  Increase right shoulder strength to at least equal left for increased ease of ADL's.   7.  Decrease impingement signs and symptoms for increased ease of ADL's.     Treatment may include:   Flexibility/ROM and strengthening exercises, HEP   Therapeutic exercise   Joint mobilization   Manual therapy   Manual traction   Mechanical traction   Electrical stimulation   Ultrasound   Moist Heat    Ice    Patient will be seen 3x/week x6 weeks to work on goals stated above.  The patient has been advised of his/her PT diagnosis, goals and POC.   Yes  The patient/family has given verbal consent for evaluation and treatment.   Yes    Total Evaluation Time:  41  Total Timed Treatment Minutes:  25  Total Treatment Time:  66 (eval, therex)      Lucille Passy, MPT          ____________________________________________  Physician Signature                                       Date

## 2017-05-01 ENCOUNTER — Ambulatory Visit (HOSPITAL_BASED_OUTPATIENT_CLINIC_OR_DEPARTMENT_OTHER): Payer: Self-pay

## 2017-05-02 ENCOUNTER — Ambulatory Visit (HOSPITAL_BASED_OUTPATIENT_CLINIC_OR_DEPARTMENT_OTHER)
Admission: RE | Admit: 2017-05-02 | Discharge: 2017-05-02 | Disposition: A | Discharge status: Still In Hospital | Payer: Medicare Other | Source: Ambulatory Visit

## 2017-05-02 NOTE — PT Treatment (Signed)
[facility] Physical Therapy  [address],   [place],  [place]  [address]  ([phone]   [phone]    Physical Therapy Treatment Note      Patient Name:  [name]  DOB:  1950-11-20    Visit:   2/18  Date of Surgery:    Next appointment with physician:   05/07/17    Subjective:     "It hurts so bad.   I tried the ex's and then tried to mop the floor and I couldn't do it with my right arm.   I had to use the left side.   It is so painful and sore."     Objective:     Therex/HEP as follows for sacroiliitis and right shoulder impingement:    x54  min,    x5-10 reps  UBE:  2 min frwd/2 min bkwd;   0 watts  Ladder climb--AAROM  B shoulder flexion stretch with hands on counter  B Codmans circles:   2#  Seated inferior GH mob of right shoulder with UT stretch  Seated inferior GH mob of right shoulder with UT stretch--look toward armpit  Scapular retractions  Standing GSC stretch (slant board)   2x30"  ITB stretch in doorframe/counter  Pulleys--flexion x2 min  Seated B piriformis stretch    Supine B shoulder flexion stretch with cane--1#= 121    Supine right shoulder ER stretch with cane--1# =66    Hooklying lumbar rotation     Right shoulder GH mobs:   A/P,   P/A and inferior--Grade ii  Right shoulder PROM:   Flexion=153,  Scaption=145,  IR=70,  ER=67    CP to right shoulder x10 min    **Patient given written copy of new ex's with pics for HEP  Assessment:     Patient arrived reporting increased c/o right shoulder pain with therex and use of shoulder with ADL's.  Several new ex's added to POC for ROM.   She will add these to HEP.  She demonstrated all ex's appropriately with pain at right shoulder with most ex's.  However, following mobilizations and PROM--she demonstrated improved right shoulder flexion to 153 AAROM.    She con't to report some radicular complaints at right UE and she is tender over distal clavicle with lump noted.    Discussed  importance of compliance with HEP.   She may benefit from further PT to address ROM/flexibility and strength deficits along with pain/radicular complaints, muscular tightness/tenderness for increased ease of ADL's.  Plan:     Con't with POC 3x/week x6 weeks to address above noted deficits for increased ease of ADL's.       Total Timed Treatment Minutes:    54  Total Treatment Time:     64 (therex, ice)

## 2017-05-03 ENCOUNTER — Ambulatory Visit (HOSPITAL_BASED_OUTPATIENT_CLINIC_OR_DEPARTMENT_OTHER): Payer: Self-pay

## 2017-05-03 ENCOUNTER — Ambulatory Visit (HOSPITAL_BASED_OUTPATIENT_CLINIC_OR_DEPARTMENT_OTHER)
Admission: RE | Admit: 2017-05-03 | Discharge: 2017-05-03 | Disposition: A | Discharge status: Still In Hospital | Payer: Medicare Other | Source: Ambulatory Visit

## 2017-05-03 NOTE — PT Treatment (Addendum)
[facility] Physical Therapy  [address]   [place],  [place]  [address]  ([phone]   [phone]    Physical Therapy Treatment Note      Patient Name:  [name]  DOB:  07/13/1950    Visit:   3/18  Date of Surgery:    Next appointment with physician:   05/07/17    Subjective:      "I'm feeling much better and not hurting as bad."  PSR=2/10.m  Objective:     Therex/HEP as follows for sacroiliitis and right shoulder impingement:    x50  min,    x10 reps  UBE:  2 min frwd/2 min bkwd;   0 watts  Ladder climb--AAROM  B shoulder flexion stretch with hands on counter  B Codmans circles:   2#  Seated inferior GH mob of right shoulder with UT stretch  Seated inferior GH mob of right shoulder with UT stretch--look toward armpit  Doorway ER stretch  Doorway flexion stretch   Scapular retractions with yellow t-band   Standing GSC stretch (slant board)   2x30"  ITB stretch in doorframe/counter  Pulleys--flexion x2 min =140  Seated B piriformis stretch    Supine B shoulder flexion stretch with cane--1#= 130    Supine right shoulder ER stretch with cane--1# =75    Supine B ER stretch with hands behind head    Hooklying lumbar rotation     Right shoulder GH mobs:   A/P,   P/A and inferior--Grade ii  Right shoulder PROM:   Flexion=158,  Scaption=145,  IR=85,  ER=81    CP to right shoulder x10 min--Pt will do at home    Assessment:      Patient arrived reporting that right shoulder was feeling much better with decreased pain and improved ROM.  Reviewed all ex's for HEP including new ex's that were added last session.   Scapular retractions performed with yellow t-band.   She demonstrated all ex's appropriately with pain at right shoulder with most ex's.  However, following mobilizations and PROM--she demonstrated improved right shoulder ROM once again in all planes.   Pain con't to be noted at distal clavicle/AC joint.   She con't to report some radicular  complaints at right UE and she is tender over distal clavicle with lump noted.    Discussed importance of compliance with HEP.   She may benefit from further PT to address ROM/flexibility and strength deficits along with pain/radicular complaints, muscular tightness/tenderness for increased ease of ADL's.  Plan:     Con't with POC 3x/week x6 weeks to address above noted deficits for increased ease of ADL's.       Total Timed Treatment Minutes:    50  Total Treatment Time:     50 (therex)

## 2017-05-09 ENCOUNTER — Ambulatory Visit (HOSPITAL_BASED_OUTPATIENT_CLINIC_OR_DEPARTMENT_OTHER)
Admission: RE | Admit: 2017-05-09 | Discharge: 2017-05-09 | Disposition: A | Discharge status: Still In Hospital | Payer: Medicare Other | Source: Ambulatory Visit

## 2017-05-09 NOTE — PT Treatment (Signed)
[facility] Physical Therapy  [address],   [place],  [place]  [address]  ([phone]   [phone]    Physical Therapy Treatment Note      Patient Name:  [name]  DOB:  01-18-1951    Visit:   4/18  Date of Surgery:    Next appointment with physician:   05/07/17    Subjective:      "I'm really hurting today.   I tried to help move some furniture at home and the Dr lifted my arm aggressively overhead and that really got my shoulder."   PSR=8/10.   "The Dr has ordered a MRI of my shoulder b/c of that bump.   He thinks it may be a bone cyst."   Objective:     Therex/HEP as follows for sacroiliitis and right shoulder impingement:    x45  min,    x10 reps  UBE:  2 min frwd/2 min bkwd;   0 watts  Ladder climb--AAROM  B shoulder flexion stretch with hands on counter  B Codmans circles:   2#  Seated inferior GH mob of right shoulder with UT stretch  Seated inferior GH mob of right shoulder with UT stretch--look toward armpit  Doorway ER stretch  Doorway flexion stretch   Scapular retractions with yellow t-band   Standing GSC stretch (slant board)   2x30"  ITB stretch in doorframe/counter  Pulleys--flexion x2 min =140  B shoulder extension with cane,  2#  B abd/add of shoulder with cane,  2#  IR stretch with strap  Seated B piriformis stretch    Supine B shoulder flexion stretch with cane--1#= 130    Supine right shoulder ER stretch with cane--1# =75    Supine B ER stretch with hands behind head    Hooklying lumbar rotation     Right shoulder GH mobs:   A/P,   P/A and inferior--Grade ii  Right shoulder PROM:   Flexion=132,  Scaption=145,  IR=85,  ER=81    CP to right shoulder x10 min--Pt will do at home    Assessment:      Patient arrived reporting a significant increase in right shoulder pain from above noted activities.  She demonstrated all ex's appropriately with pain at right shoulder with most ex's along with decreased ROM.  PROM and mobs were  also limited and painful in all planes with decreased motion noted today when compared to last session.   Pain con't to be noted at distal clavicle/AC joint and this is going to be evaluated through MRI on 05/23/17.   She con't to report some radicular complaints at right UE and she is tender over distal clavicle with lump noted.    Discussed importance of compliance with HEP.   She may benefit from further PT to address ROM/flexibility and strength deficits along with pain/radicular complaints, muscular tightness/tenderness for increased ease of ADL's.  Plan:     Con't with POC 3x/week x6 weeks to address above noted deficits for increased ease of ADL's.       Total Timed Treatment Minutes:    45  Total Treatment Time:     45 (therex)

## 2017-05-11 ENCOUNTER — Ambulatory Visit (HOSPITAL_BASED_OUTPATIENT_CLINIC_OR_DEPARTMENT_OTHER)
Admission: RE | Admit: 2017-05-11 | Discharge: 2017-05-11 | Disposition: A | Discharge status: Still In Hospital | Payer: Medicare Other | Source: Ambulatory Visit

## 2017-05-11 NOTE — PT Treatment (Signed)
[facility] Physical Therapy  [address],   [place],  [place]  [address]  ([phone]   [phone]    Physical Therapy Treatment Note      Patient Name:  [name]  DOB:  [birth year]    Visit:   5/18  Date of Surgery:    Next appointment with physician:   05/07/17    Subjective:      "I need to leave a little early today.   I'm still pretty sore at my shoulder.   My low back and hip are still bothering me as well."   "The shoulder pain still wakes me up when I push up and turn over in bed."     Objective:     Therex/HEP as follows for sacroiliitis and right shoulder impingement:    x39  min,    x10 reps  UBE:  2 min frwd/2 min bkwd;   0 watts  Ladder climb--AAROM  B shoulder flexion stretch with hands on counter  B Codmans circles:   2#  Seated inferior GH mob of right shoulder with UT stretch  Seated inferior GH mob of right shoulder with UT stretch--look toward armpit  Doorway ER stretch  Doorway flexion stretch   IR/ER holding onto Nautilus and roll body inward/outward  Scapular retractions with yellow t-band   Standing GSC stretch (slant board)   2x30"  ITB stretch in doorframe/counter--HOLD  Pulleys--flexion x2 min =125  B shoulder extension with cane,  2#  B abd/add of shoulder with cane,  2#  IR stretch with strap  Seated B piriformis stretch    Supine B shoulder flexion stretch with cane--1#= 130    Supine right shoulder ER stretch with cane--1# =75    Supine B ER stretch with hands behind head--HOLD    Hooklying lumbar rotation     Right shoulder GH mobs:   A/P,   P/A and inferior--Grade ii  Right shoulder PROM:   Flexion=135,  Scaption=145,  IR=85,  ER=83    CP to right shoulder x10 min--Pt will do at home    Assessment:      Patient arrived reporting increased right shoulder pain once again.  She demonstrated all ex's appropriately, but with pain at right shoulder during most ex's.  PROM and mobs were also limited and painful in all  planes with motion noted to be about the same as last session with slight improvement during flexion and ER.  Pain con't to be noted at distal clavicle/AC joint and this is going to be evaluated through MRI on 05/23/17.   She con't to report some radicular complaints at right UE and she is tender over distal clavicle with lump noted.    Discussed importance of compliance with HEP.   She may benefit from further PT to address ROM/flexibility and strength deficits along with pain/radicular complaints, muscular tightness/tenderness for increased ease of ADL's.  Plan:     Con't with POC 3x/week x6 weeks to address above noted deficits for increased ease of ADL's.       Total Timed Treatment Minutes:    39  Total Treatment Time:     39 (therex)

## 2017-05-14 ENCOUNTER — Ambulatory Visit (HOSPITAL_BASED_OUTPATIENT_CLINIC_OR_DEPARTMENT_OTHER)
Admission: RE | Admit: 2017-05-14 | Discharge: 2017-05-14 | Disposition: A | Discharge status: Still In Hospital | Payer: Medicare Other | Source: Ambulatory Visit

## 2017-05-14 NOTE — PT Treatment (Signed)
[facility] Physical Therapy  [address]   [place],  [place]  [address]  ([phone]   [phone]    Physical Therapy Treatment Note      Patient Name:  [name]  DOB:  1950-07-20    Visit:   5/18  Date of Surgery:    Next appointment with physician:   05/07/17    Subjective:      "I think it's a little better."    PSR:  3/10.   "I took it easy over the weekend."   Objective:     Therex/HEP as follows for sacroiliitis and right shoulder impingement:    x40  min,    x10 reps  UBE:  2 min frwd/2 min bkwd;   0 watts  Ladder climb--AAROM  B shoulder flexion stretch with hands on counter  B Codmans circles:   2#  Seated inferior GH mob of right shoulder with UT stretch  Seated inferior GH mob of right shoulder with UT stretch--look toward armpit  Doorway ER stretch  Doorway flexion stretch   IR/ER holding onto Nautilus and roll body inward/outward  Scapular retractions with yellow t-band   Standing GSC stretch (slant board)   2x30"  ITB stretch in doorframe/counter--HOLD  Pulleys--flexion x2 min =135  B shoulder extension with cane,  2#  B abd/add of shoulder with cane,  2#  IR stretch with strap  Seated B piriformis stretch    Supine B shoulder flexion stretch with cane--1#= 130    Supine right shoulder ER stretch with cane--1# =75    Supine B ER stretch with hands behind head    Hooklying lumbar rotation     Right shoulder GH mobs:   A/P,   P/A and inferior--Grade ii  Right shoulder PROM:   Flexion=141,  Scaption=145,  IR=85,  ER=83    CP to right shoulder x10 min--Pt will do at home    Assessment:      Patient arrived reporting less pain today with slightly improved ROM.   She demonstrated all ex's appropriately, but with pain at right shoulder during most ex's--although increased ROM noted.Marland Kitchen  PROM and mobs were limited and painful--although improved when compared to last session.   Pain con't to be noted at distal clavicle/AC joint and this  is going to be evaluated through MRI on 05/15/17.  Very slow progress noted overall with shoulder motion and strength.  Back complaints con't to be noted with stretching due to tightness. .    Discussed importance of compliance with HEP.   She may benefit from further PT to address ROM/flexibility and strength deficits along with pain/radicular complaints, muscular tightness/tenderness for increased ease of ADL's.  Plan:     Con't with POC 3x/week x6 weeks to address above noted deficits for increased ease of ADL's.       Total Timed Treatment Minutes:    40  Total Treatment Time:     40 (therex)

## 2017-05-16 ENCOUNTER — Ambulatory Visit (HOSPITAL_BASED_OUTPATIENT_CLINIC_OR_DEPARTMENT_OTHER)
Admission: RE | Admit: 2017-05-16 | Discharge: 2017-05-16 | Disposition: A | Discharge status: Still In Hospital | Payer: Medicare Other | Source: Ambulatory Visit

## 2017-05-16 NOTE — PT Treatment (Signed)
[facility] Physical Therapy  [address]   [place],  [place]  [address]  ([phone]   [phone]    Physical Therapy Treatment Note      Patient Name:  [name]  DOB:  1950-09-12    Visit:   6/18  Date of Surgery:    Next appointment with physician:   05/07/17     Subjective:      "I used my arm a little more yesterday and I did a little lifting--nothing heavy and it got me."   "It's really sore again today.   I go for my MRI today."   Objective:     Therex/HEP as follows for sacroiliitis and right shoulder impingement:    x45  min,    x10 reps  UBE:  2 min frwd/2 min bkwd;   0 watts  Ladder climb--AAROM  B shoulder flexion stretch with hands on counter  B Codmans circles:   2#  Seated inferior GH mob of right shoulder with UT stretch  Seated inferior GH mob of right shoulder with UT stretch--look toward armpit  Doorway ER stretch  Doorway flexion stretch   IR/ER holding onto Nautilus and roll body inward/outward  Scapular retractions and shoulder extension with yellow t-band   IR/Er with yellow t-band   Standing GSC stretch (slant board)   2x30"  ITB stretch in doorframe/counter--HOLD  Pulleys--flexion x2 min =135  B shoulder extension with cane,  2#  B abd/add of shoulder with cane,  2#  IR stretch with strap  Seated B piriformis stretch    Supine B shoulder flexion stretch with cane--1#= 130    Supine right shoulder ER stretch with cane--1# =75    Supine B ER stretch with hands behind head    Hooklying lumbar rotation     Right shoulder GH mobs:   A/P,   P/A and inferior--Grade ii  Right shoulder PROM:   Flexion=145,  Scaption=145,  IR=85,  ER=83    CP to right shoulder x10 min--Pt will do at home    Assessment:      Patient arrived reporting a slight increase in pain complaints secondary to increased use of shoulder last night.   Several new ex's added to POC with t-band and arms behind head for ER stretch.   She demonstrated all ex's  appropriately, but with pain at right shoulder during most ex's once again.  PROM and mobs were limited and painful--although slightly improved when compared to last session.   Pain con't to be noted at distal clavicle/AC joint and this is going to be evaluated through MRI today.  Very slow progress noted overall with shoulder motion and strength.   Minimal reports of back complaints today.  Discussed importance of compliance with HEP.   She may benefit from further PT to address ROM/flexibility and strength deficits along with pain/radicular complaints, muscular tightness/tenderness for increased ease of ADL's.  Plan:     Con't with POC 3x/week x6 weeks to address above noted deficits for increased ease of ADL's.       Total Timed Treatment Minutes:    45  Total Treatment Time:     45 (therex)

## 2017-05-18 ENCOUNTER — Ambulatory Visit (HOSPITAL_BASED_OUTPATIENT_CLINIC_OR_DEPARTMENT_OTHER)
Admission: RE | Admit: 2017-05-18 | Discharge: 2017-05-18 | Disposition: A | Discharge status: Still In Hospital | Payer: Medicare Other | Source: Ambulatory Visit

## 2017-05-18 NOTE — PT Treatment (Signed)
[facility] Physical Therapy  [address],   [place],  [place]  [address]  ([phone]   [phone]    Physical Therapy Treatment Note      Patient Name:  [name]  DOB:  06/15/1950    Visit:   7/18  Date of Surgery:    Next appointment with physician:   05/07/17     Subjective:      "I had the MRI yesterday.    My shoulder feels sore.   My legs are still cramping though and it keeps me awake at night.   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."   Objective:     Therex/HEP as follows for sacroiliitis and right shoulder impingement:    x49  min,    x10 reps  UBE:  2 min frwd/2 min bkwd;   0 watts  Ladder climb--AAROM  B shoulder flexion stretch with hands on counter  B Codmans circles:   2#  Seated inferior GH mob of right shoulder with UT stretch  Seated inferior GH mob of right shoulder with UT stretch--look toward armpit  Doorway ER stretch  Doorway flexion stretch   IR/ER holding onto Nautilus and roll body inward/outward  Scapular retractions and shoulder extension with yellow t-band   IR/Er with yellow t-band   Standing GSC stretch (slant board)   2x30"  ITB stretch in doorframe/counter--HOLD  Pulleys--flexion x2 min =135  B shoulder extension with cane,  2#  B abd/add of shoulder with cane,  2#  IR stretch with strap  Seated B piriformis stretch    Supine B shoulder flexion stretch with cane--1#= 155    Supine right shoulder ER stretch with cane--1# =75    Supine B ER stretch with hands behind head    Hooklying lumbar rotation     Right shoulder GH mobs:   A/P,   P/A and inferior--Grade ii  Right shoulder PROM:   Flexion=145,  Scaption=145,  IR=85,  ER=80    CP to right shoulder x10 min--Pt will do at home    Assessment:      Patient arrived reporting decreased c/o pain today as well as improved ROM.  However, she reports that she has been resting shoulder at home.    She demonstrated all ex's appropriately with minimal pain at right shoulder during end ranges.  PROM and mobs were much improved since last session.  She will con't to rest shoulder with no lifting due to inflammation and risk of flaring symptoms at this time.  MRI did not note anything about bump at medial clavicle.   Improved shoulder motion and strength today as well as decreased pain.   Minimal reports of back complaints today.  Discussed importance of compliance with HEP.   She may benefit from further PT to address ROM/flexibility and strength deficits along with pain/radicular complaints, muscular tightness/tenderness for increased ease of ADL's.  Plan:     Con't with POC 3x/week x6 weeks to address above noted deficits for increased ease of ADL's.       Total Timed Treatment Minutes:    49  Total Treatment Time:     49 (therex)

## 2017-05-21 ENCOUNTER — Ambulatory Visit (HOSPITAL_BASED_OUTPATIENT_CLINIC_OR_DEPARTMENT_OTHER)
Admission: RE | Admit: 2017-05-21 | Discharge: 2017-05-21 | Disposition: A | Discharge status: Still In Hospital | Payer: Medicare Other | Source: Ambulatory Visit

## 2017-05-21 NOTE — PT Treatment (Signed)
[facility]  [address],   [place],  [place]  [address]  ([phone]   [phone]    Physical Therapy Treatment Note      Patient Name:  [name]  DOB:  03/07/1951    Visit:   9/18  Date of Surgery:    Next appointment with physician:   05/07/17     Subjective:      "I'm feeling better, but I lifted up a case of soda this morning with this arm and I felt it immediately."     Objective:     Therex/HEP as follows for sacroiliitis and right shoulder impingement:    x41  min,    x10 reps  UBE:  2 min frwd/2 min bkwd;   0 watts  Ladder climb--AAROM  B shoulder flexion stretch with hands on counter  B Codmans circles:   2#  Seated inferior GH mob of right shoulder with UT stretch  Seated inferior GH mob of right shoulder with UT stretch--look toward armpit  Doorway ER stretch  Doorway flexion stretch   IR/ER holding onto Nautilus and roll body inward/outward  Scapular retractions and shoulder extension with orange t-band   IR/Er with yellow t-band   Standing GSC stretch (slant board)   2x30"  ITB stretch in doorframe/counter--HOLD  Pulleys--flexion x2 min =150  B shoulder extension with cane,  2#  B abd/add of shoulder with cane,  2#  IR stretch with strap  Seated B piriformis stretch    Supine B shoulder flexion stretch with cane--2#= 145    Supine right shoulder ER stretch with cane--2# =80    Supine B ER stretch with hands behind head    Hooklying lumbar rotation     Right shoulder GH mobs:   A/P,   P/A and inferior--Grade ii  Right shoulder PROM:   Flexion=155,  Scaption=150,  IR=88,  ER=83    CP to right shoulder x10 min    Assessment:      Patient arrived reporting less pain today as well as improved ROM.  However, she did flare it up slightly this morning when she picked up a case of soda.   She demonstrated all ex's appropriately with minimal pain at right shoulder during end ranges.  PROM and mobs were improved once again  with slight increase in PROM again as well as improved motion with pulleys.  She will con't to rest shoulder with no lifting due to inflammation and risk of flaring symptoms at this time.  Slow progress con't with ROM and strength with all therex.   She was given yellow t-band and copy of new ex's for HEP.  Minimal reports of back complaints today.  Discussed importance of compliance with HEP.   She may benefit from further PT to address ROM/flexibility and strength deficits along with pain/radicular complaints, muscular tightness/tenderness for increased ease of ADL's.  Plan:     Con't with POC 3x/week x6 weeks to address above noted deficits for increased ease of ADL's.       Total Timed Treatment Minutes:    41  Total Treatment Time:     51 (therex, ice)

## 2017-05-23 ENCOUNTER — Ambulatory Visit (HOSPITAL_BASED_OUTPATIENT_CLINIC_OR_DEPARTMENT_OTHER)
Admission: RE | Admit: 2017-05-23 | Discharge: 2017-05-23 | Disposition: A | Discharge status: Home / Self Care | Payer: Medicare Other | Source: Ambulatory Visit | Attending: Orthopaedic Surgery | Admitting: Orthopaedic Surgery

## 2017-05-23 NOTE — PT Treatment (Signed)
[facility]  [address]   [place],  [place]  [address]  ([phone]   [phone]    Physical Therapy Treatment Note      Patient Name:  [name]  DOB:  [birth year]    Visit:   10/18  Date of Surgery:    Next appointment with physician:   05/23/17     Subjective:      PSR:  6/10.   "It's sore this morning.   I did some baking and lifting and that flared it up."    Patient will f/u with MD today.  Objective:     RE measurements as follows:  Independent HEP  ROM:  Trunk/L-spine: Flexion=minimally limited, Extension=moderately limited with some LBP, B rot=minimally limited, B SB=moderately limited  Right shoulder AROM:    Flexion=131,    Extension=61    Abduction=127,   IR=T9   ER=T1  Right shoulder PROM:  Right shoulder PROM:   Flexion=155,  Scaption=150,  IR=88,  ER=83--taken 05/21/17  Left shoulder:   Flexion=137,    Extension=65    Abduction=133   IR=T8,    ER=T2  Strength:   Right LE grossly 4/5  Left LE grossly 4 to 4+/5  RUE shoulder grossly 4/5  LUE shoulder grossly 4 to 4+/5  Pain:  6/10--right shoulder and lateral hip      Therex/HEP as follows for sacroiliitis and right shoulder impingement:    x41  min,    x10 reps  UBE:  2 min frwd/2 min bkwd;   0 watts  Ladder climb--AAROM  B shoulder flexion stretch with hands on counter  B Codmans circles:   2#  Seated inferior GH mob of right shoulder with UT stretch  Seated inferior GH mob of right shoulder with UT stretch--look toward armpit  Doorway ER stretch  Doorway flexion stretch   IR/ER holding onto Nautilus and roll body inward/outward  Scapular retractions and shoulder extension with orange t-band   IR/Er with yellow t-band   Standing GSC stretch (slant board)   2x30"  ITB stretch in doorframe/counter--HOLD  Pulleys--flexion x2 min =125  B shoulder extension with cane,  2#  B abd/add of shoulder with cane,  2#  IR stretch with strap  Seated B piriformis  stretch    Supine B shoulder flexion stretch with cane--2#= 145    Supine right shoulder ER stretch with cane--2# =80    Supine B ER stretch with hands behind head    Hooklying lumbar rotation     Right shoulder GH mobs:   A/P,   P/A and inferior--Grade ii--HOLD  Right shoulder PROM:   Flexion=155,  Scaption=150,  IR=88,  ER=83--taken 05/21/17    CP to right shoulder x10 min--Pt will perform at home    Assessment:      Patient arrived reporting increased pain at shoulder as she did some baking yesterday.  She con't to report that the only time that she is pain free is if she doesn't use her shoulder at all.   Minimal activity will flare up pain complaints and she will lose ROM.  Lump con't to be noted at lateral clavicle that is painful.   Reviewed all ex's for HEP as she will be OOT for the next 2 months.   She demonstrated all ex's appropriately with increased pain at right shoulder during motion and end ranges.  PROM and mobs were held today  secondary to increased c/o pain.  Significant loss of motion when compared to last session; however, increase ROM noted when compared to IE.   She has a written copy of all ex's with pics for HEP.   Discussed importance of compliance with HEP since she will be away, but also to limit lifting as this flares up symptoms the most.   She will f/u with MD today to discuss MRI.    Goals:   STG met.    No LTG's met.   Plan:     D/C to HEP as patient will be OOT for the next 2 months.   She will need a new order to continue with therapy when she returns.        Total Timed Treatment Minutes:    41  Total Treatment Time:     41 (therex)

## 2017-05-25 ENCOUNTER — Ambulatory Visit (HOSPITAL_BASED_OUTPATIENT_CLINIC_OR_DEPARTMENT_OTHER): Payer: Medicare Other

## 2017-06-06 ENCOUNTER — Encounter (INDEPENDENT_AMBULATORY_CARE_PROVIDER_SITE_OTHER): Payer: Self-pay | Admitting: Specialist

## 2017-06-06 NOTE — Telephone Encounter (Signed)
Mailed to pt  Unable to place in [place]

## 2017-06-06 NOTE — Telephone Encounter (Signed)
Regarding: Test Results Question  Contact: [phone]  ----- Message from [place], Generic sent at 06/06/2017 11:47 AM EST -----    You sent me for an EMG at [facility] on 02/19/17 due to the locking problems I was having in my legs and feet. I went but I have been unable to access the results.  The [facility] indicates that they were sent to you but they don't appear in the chart that I can access.  Can you share these results with me as soon as possible?    Thank you,    [name]  [email or web address]  [phone]  [address], [place]  [address]

## 2017-06-06 NOTE — Telephone Encounter (Signed)
Regarding: Test Results Question  Contact: [phone]  ----- Message from [name], Generic sent at 06/06/2017 11:47 AM EST -----    You sent me for an EMG at  [facility] on 02/19/17 due to the locking problems I was having in my legs and feet. I went but I have been unable to access the results.  The [facility] indicates that they were sent to you but they don't appear in the chart that I can access.  Can you share these results with me as soon as possible?    Thank you,    [name]  [email or web address]  [phone]  [address], [place]  [address]

## 2017-08-20 ENCOUNTER — Encounter (INDEPENDENT_AMBULATORY_CARE_PROVIDER_SITE_OTHER): Payer: Self-pay | Admitting: Specialist

## 2017-11-13 ENCOUNTER — Ambulatory Visit (INDEPENDENT_AMBULATORY_CARE_PROVIDER_SITE_OTHER): Payer: Medicare Other | Admitting: Physician Assistant

## 2017-11-13 ENCOUNTER — Encounter (INDEPENDENT_AMBULATORY_CARE_PROVIDER_SITE_OTHER): Payer: Self-pay | Admitting: Physician Assistant

## 2017-11-13 VITALS — BP 106/71 | HR 80 | Temp 97.9°F | Wt 167.8 lb

## 2017-11-13 DIAGNOSIS — J329 Chronic sinusitis, unspecified: Secondary | ICD-10-CM

## 2017-11-13 MED ORDER — AMOXICILLIN 875 MG-POTASSIUM CLAVULANATE 125 MG TABLET: 1 | Tab | Freq: Two times a day (BID) | ORAL | 0 refills | 0 days | Status: DC

## 2017-11-13 MED ORDER — BENZONATATE 200 MG CAPSULE: 200 mg | Cap | Freq: Three times a day (TID) | ORAL | 0 refills | 0 days | Status: DC | PRN

## 2017-11-13 NOTE — Addendum Note (Signed)
Addended by: Beverely Low on: 11/13/2017 03:08 PM     Modules accepted: Orders

## 2017-11-13 NOTE — Progress Notes (Signed)
URGENT CARE, [place]  [place] [address]       Name: Charma Mocarski MRN:  [id number]   Date: 11/13/2017 Age: 67 y.o.       SUBJECTIVE:   Dietra Stokely is a 67 y.o. female who complains of sinus pain, nasal congestion, thick and dark sinus drainage for 2 weeks. Pt states she has tried mucinex for the past two weeks without relief. She denies a history of fevers, chest pain, dizziness, nausea and vomiting and denies a history of asthma.     ROS: no fever, +sinus headaches, no wheezing but some shortness of breath, no weakness or difficulty with ambulation, no skin rash or lesion      Social History     Tobacco Use   . Smoking status: Never Smoker   . Smokeless tobacco: Never Used   Substance Use Topics   . Alcohol use: No   . Drug use: No     Allergy History as of 11/13/17     OXYCODONE-ACETAMINOPHEN       Noted Status Severity Type Reaction    09/20/09 1055 [place], [place] 09/20/09 Active High Side Effect Nausea/ Vomiting          HYMENOPTERA ALLERGENIC EXTRACT       Noted Status Severity Type Reaction    09/16/15 1502 Eulas Post, [place] 09/16/15 Active             PROCAINE       Noted Status Severity Type Reaction    03/31/17 1653 Isidoro Donning, RTR 11/05/13 Active    Other Adverse Reaction (Add comment)          POLYETHYLENE GLYCOL 3350       Noted Status Severity Type Reaction    11/13/17 1443 Raeford Razor, CMA 06/05/16 Active High  Hives/ Urticaria          POLYETHYLENE GLYCOL       Noted Status Severity Type Reaction    11/13/17 1443 Raeford Razor, CMA 06/05/16 Active High  Hives/ Urticaria          VENOM-WASP       Noted Status Severity Type Reaction    11/13/17 1443 Raeford Razor, [place] 09/16/15 Active                 Family Medical History:     Problem Relation (Age of Onset)    Asthma Mother, Sister, Brother    Colon Cancer Mother, Father    Congestive Heart Failure Mother    Diabetes Father    No Known Problems Daughter, Maternal Grandmother, Maternal  Grandfather, Paternal 24, Paternal 30, Son, Maternal Aunt, Maternal Uncle, Paternal 11, Paternal Uncle, Other            Past Medical History:   Diagnosis Date   . Breast lump 2005    Resolved prior to biopsy   . Detached retina    . Esophageal reflux    . Hx of migraines    . Iron deficiency anemia    . LLQ abdominal pain    . Migraine    . Osteoporosis    . Pancytopenia    . Prolapsed uterus          Patient Active Problem List   Diagnosis   . Pancytopenia   . Left lower quadrant pain   . Iron deficiency   . Fatigue   . Dyspnea   . 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HCC)  Outpatient Medications Prior to Visit:  calcium-vits D3-C-K2-minerals 166.75 mg- 166.75 unit Oral Capsule calcium   coenzyme Q10 300 mg Oral Capsule Take 300 mg by mouth   diclofenac sodium (VOLTAREN) 50 mg Oral Tablet, Delayed Release (E.C.)    DOCOSAHEXANOIC ACID ORAL Take 1,000 mg by mouth   EPINEPHrine 0.3 mg/0.3 mL Injection Auto-Injector 0.3 mL (0.3 mg total) by Intramuscular route Once, as needed for up to 1 dose   Glucosamine HCl 1,500 mg Oral Tablet Take by mouth   lidocaine (XYLOCAINE) 5 % Ointment Apply topically   Magnesium 30 mg Oral Tablet magnesium   MELOXICAM ORAL Take by mouth   omega-3s/dha/epa/fish oil/D3 (VITAMIN-D + OMEGA-3 ORAL) Vitamin D   Zolmitriptan (ZOMIG) 5 mg Oral Tablet TAKE AT ONSET OF HEADACHE, MAY REPEAT IN 2 HOURS IF NEEDED. MAX OF 2 PER DAY MAX OF 3 DAYS PER WEEK     No facility-administered medications prior to visit.      OBJECTIVE:  BP 106/71   Pulse 80   Temp 36.6 C (97.9 F) (Oral)   Wt 76.1 kg (167 lb 12.8 oz)   SpO2 98%   BMI 25.89 kg/m     She appears well, vital signs are as noted.   Ears: normal TM's and external ear canals AU.    Throat and pharynx mild erythema, no tonsillar hypertrophy, no exudates.   Neck supple. No adenopathy in the neck. Nose clear mucus present in both nares. Maxillary sinuses +TTP L>R   The chest is clear, without wheezes or rales   Heart: regular rate and  rhythm  Skin: Warm and dry    ASSESSMENT:   No diagnosis found.    PLAN:  Augmentin, tessalon per epic orders. Discussed good handwashing, may use tylenol for fever or pain Q4-6hrs.  Symptomatic therapy suggested: push fluids, rest, use acetaminophen, ibuprofen, antihistamine-decongestant of choice, cough suppressant of choice prn and ROV prn if symptoms persist or worsen. Pt verbalized understanding and agrees with the plan of care. Advised pt to call or return to clinic prn if these symptoms worsen or fail to improve as anticipated.    Mariella Saa, PA-C 11/13/2017 15:05    My collaborating physician today is Dr. Donneta Romberg.

## 2017-11-13 NOTE — Patient Instructions (Signed)
URGENT CARE, [place]  [place] [address]  Phone: [phone]  Fax: [phone]           Open Daily 8:00am - 8:00pm, except Sundays 12pm-8pm         ~ Closed Thanksgiving and Christmas Day     Attending Caregiver: Mariella Saa, PA-C    Today's orders: No orders of the defined types were placed in this encounter.       Prescription(s) E-Rx to:  [place], [address]    ________________________________________________________________________  Short Term Disability and [facility] does NOT provide assistance with any disability applications.  If you feel your medical condition requires you to be on disability, you will need to follow up with  Your primary care physician or a specialist.  We apologize for any inconvenience.    For Medication Prescribed by [facility]:  As an Urgent Care facility, our clinic does NOT offer prescription refills over the telephone.    If you need more of the medication one of our medical providers prescribed, you will  Either need to be re-evaluated by [place] or see your primary care physician.    ________________________________________________________________________      It is very important that we have a phone number that is the single best way to contact you in the event that we become aware of important clinical information or concerns after your discharge.  If the phone number you provided at registration is NOT this number you should inform staff and registration prior to leaving.      Your treatment and evaluation today was focused on identifying and treating potentially emergent conditions based on your presenting signs, symptoms, and history.  The resulting initial clinical impression and treatment plan is not intended to be definitive or a substitute for a full physical examination and evaluation by your primary care provider.  If your symptoms persist,  worsen, or you develop any new or concerning symptoms, you need to be evaluated.      If you received x-rays during your visit, be aware that the final and formal interpretation of those films by a radiologist may occur after your discharge.  If there is a significant discrepancy identified after your discharge, we will contact you at the telephone number provided at registration.      If you received a pelvic exam, you may have cultures pending for sexually transmitted diseases.  Positive cultures are reported to the [place] Department of Health as required by state law.  You should be contacted if you cultures are positive.  We will not contact you if they are negative.  You did NOT receive a PAP smear (the screening test for cervical).  This specific test for women is best performed by your gynecologist or primary care provider when indicated.      If you are over 60 year old, we cannot discuss your personal health information with a parent, spouse, family member, or anyone else without your express consent.  This does not include those who have legitimate access to your records and information to assist in your care under the provisions of HIPAA ([place]) law, or those to whom you have previously given express written consent to do so, such a legal guardian or Power of [place].      You may have received medication that may cause you to  feel drowsy and/or light headed for several hours.  You may even experience some amnesia of your stay.  You should avoid operating a motor vehicle or performing any activity requiring complete alertness or coordination until you feel fully awake (approximately 24-48 hours).  Avoid alcoholic beverages.  You may also have a dry mouth for several hours.  This is a normal side effect and will disappear as the effects of the medication wear off.      Instructions discussed with patient upon discharge by clinical staff with all questions  answered.  Please call [facility] [phone] if any further questions.  Go immediately to the emergency department if any concern or worsening symptoms.    Mariella Saa, PA-C 11/13/2017, 14:54

## 2017-11-13 NOTE — Nursing Note (Signed)
BP 106/71   Pulse 80   Temp 36.6 C (97.9 F) (Oral)   Wt 76.1 kg (167 lb 12.8 oz)   SpO2 98%   BMI 25.89 kg/m     Raeford Razor, CMA  11/13/2017, 14:43

## 2017-12-06 ENCOUNTER — Ambulatory Visit (INDEPENDENT_AMBULATORY_CARE_PROVIDER_SITE_OTHER): Payer: Medicare Other | Admitting: Family Medicine

## 2017-12-06 ENCOUNTER — Encounter (INDEPENDENT_AMBULATORY_CARE_PROVIDER_SITE_OTHER): Payer: Self-pay | Admitting: Family Medicine

## 2017-12-06 VITALS — BP 108/66 | HR 64 | Temp 98.8°F | Ht 68.0 in | Wt 165.6 lb

## 2017-12-06 DIAGNOSIS — J0100 Acute maxillary sinusitis, unspecified: Secondary | ICD-10-CM

## 2017-12-06 DIAGNOSIS — R059 Cough, unspecified: Secondary | ICD-10-CM

## 2017-12-06 DIAGNOSIS — R05 Cough: Secondary | ICD-10-CM

## 2017-12-06 DIAGNOSIS — J0110 Acute frontal sinusitis, unspecified: Secondary | ICD-10-CM

## 2017-12-06 MED ORDER — FLUTICASONE PROPIONATE 50 MCG/ACTUATION NASAL SPRAY,SUSPENSION
1.0000 | Freq: Every day | NASAL | 0 refills | Status: DC
Start: 2017-12-06 — End: 2019-11-07

## 2017-12-06 MED ORDER — AMOXICILLIN 875 MG-POTASSIUM CLAVULANATE 125 MG TABLET: 1 | Tab | Freq: Two times a day (BID) | ORAL | 0 refills | 0 days | Status: AC

## 2017-12-06 MED ORDER — METHYLPREDNISOLONE 4 MG TABLETS IN A DOSE PACK
ORAL_TABLET | ORAL | 0 refills | Status: DC
Start: 2017-12-06 — End: 2018-01-09

## 2017-12-06 NOTE — Progress Notes (Signed)
[place], [place]  [address] [place] [address]  Dept: [phone]  Dept Fax: [phone]  Loc: [phone]  [place] Fax: [phone]     Patient Name: [name]  Date of Service: 12/06/17  Date of Birth: [birth year]    Chief Complaint: Cough (x1 week) and Nasal Congestion (x1 week)      HPI: [name] is a 67 y.o. female who presents today with sinus pressure, sinus congestion, postnasal drainage, productive cough with occasional exertional shortness of breath and occasional exertion wheezing for the last 1 week.  She was seen here last month and was prescribed Augmentin for sinus infection and she started feeling better after the use of Augmentin and after 1 week of finishing Augmentin she started having symptoms again and within the last 1 week the got worse.  Now she has lost her voice because of excessive coughing.  She denies any fever or chills but does report loss of energy and appetite.    History:  Vital signs and history as obtained by clinical staff.  Past Medical History:   Diagnosis Date   . Breast lump 2005    Resolved prior to biopsy   . Detached retina    . Esophageal reflux    . Hx of migraines    . Iron deficiency anemia    . LLQ abdominal pain    . Migraine    . Osteoporosis    . Pancytopenia    . Prolapsed uterus          Past Surgical History:   Procedure Laterality Date   . HX BREAST BIOPSY Bilateral     benign    . HX CESAREAN SECTION  1993   . HX COLONOSCOPY     . HX ENDOSCOPIC SINUS SURGERY     . HX TONSILLECTOMY  1955         Family Medical History:     Problem Relation (Age of Onset)    Asthma Mother, Sister, Brother    Colon Cancer Mother, Father    Congestive Heart Failure Mother    Diabetes Father    No Known Problems Daughter, Maternal Grandmother, Maternal Grandfather, Paternal 66, Paternal Grandfather, Son, Maternal Aunt, Maternal Uncle, Paternal 26, Paternal Uncle, Other            Social History         Socioeconomic History   . Marital status: Married     Spouse name: Not on file   . Number of children: Not on file   . Years of education: Not on file   . Highest education level: Not on file   Occupational History   . 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 Smoking status: Never Smoker   . Smokeless tobacco: Never Used   Substance and Sexual Activity   . Alcohol use: No   . Drug use: No   . Sexual activity: Yes     Partners: Male   Other Topics Concern     Expanded Substance History     Additional history       Allergies:  Allergies   Allergen Reactions   . Percocet [Oxycodone-Acetaminophen] Nausea/ Vomiting   . Polyethylene Glycol Hives/ Urticaria   . Polyethylene Glycol 3350 Hives/ Urticaria   . Beesting [Hymenoptera Allergenic Extract]    . Procaine  Other  Adverse Reaction (Add comment)   . Venom-Wasp      Problem List:  Patient Active Problem List    Diagnosis   . 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HCC)   . Dyspnea   . Fatigue   . Iron deficiency   . Left lower quadrant pain   . Pancytopenia     Medication:  Outpatient Encounter Medications as of 12/06/2017   Medication Sig Dispense Refill   . amoxicillin-pot clavulanate (AUGMENTIN) 875-125 mg Oral Tablet Take 1 Tab by mouth Every 12 hours for 14 days 28 Tab 0   . Benzonatate (TESSALON) 200 mg Oral Capsule Take 1 Cap (200 mg total) by mouth Three times a day as needed for Cough 30 Cap 0   . calcium-vits D3-C-K2-minerals 166.75 mg- 166.75 unit Oral Capsule calcium     . coenzyme Q10 300 mg Oral Capsule Take 300 mg by mouth     . diclofenac sodium (VOLTAREN) 50 mg Oral Tablet, Delayed Release (E.C.)      . DOCOSAHEXANOIC ACID ORAL Take 1,000 mg by mouth     . EPINEPHrine 0.3 mg/0.3 mL Injection Auto-Injector 0.3 mL (0.3 mg total) by Intramuscular route Once, as needed for up to 1 dose 2 Each 2   . fluticasone propionate (FLONASE) 50 mcg/actuation Nasal Spray, Suspension 1 Spray by Each Nostril route Once a day 16 g 0    . Glucosamine HCl 1,500 mg Oral Tablet Take by mouth     . lidocaine (XYLOCAINE) 5 % Ointment Apply topically     . Magnesium 30 mg Oral Tablet magnesium     . MELOXICAM ORAL Take by mouth     . Methylprednisolone (MEDROL DOSEPACK) 4 mg Oral Tablets, Dose Pack Take as instructed. 21 Tab 0   . omega-3s/dha/epa/fish oil/D3 (VITAMIN-D + OMEGA-3 ORAL) Vitamin D     . Zolmitriptan (ZOMIG) 5 mg Oral Tablet TAKE AT ONSET OF HEADACHE, MAY REPEAT IN 2 HOURS IF NEEDED. MAX OF 2 PER DAY MAX OF 3 DAYS PER WEEK 9 Tab 11   . [DISCONTINUED] amoxicillin-pot clavulanate (AUGMENTIN) 875-125 mg Oral Tablet Take 1 Tab by mouth Every 12 hours (Patient not taking: Reported on 12/06/2017) 20 Tab 0     No facility-administered encounter medications on file as of 12/06/2017.        Review of Systems:  Pertinent items are noted in HPI. All pertinent positives and negatives noted in the HPI  Constitutional: No recent illness, no fever, no chills, no night sweats, no weight loss, + loss of appetite, + fatigue.  ENT: + sore throat. + rhinorrhea, + sinus pains, + nasal congestion.  Respiratory: + cough, + exertional shortness of breath, + exertional wheezing, No hemoptysis.  Cardiovascular: no chest pain  No palpitation, No exertional dyspnea  Skin: no rashes.    Exam:  General Vitals: BP 108/66   Pulse 64   Temp 37.1 C (98.8 F) (Oral)   Ht 1.727 m (5\' 8" )   Wt 75.1 kg (165 lb 9.6 oz)   SpO2 97%   BMI 25.18 kg/m       General: acutely ill and mild distress  Eyes: Conjunctivae/corneas clear, PERRLA, EOM's intact.   Head - normocephalic, atraumatic.  Neck- supple  HENT:Left TM and canal normal. , Right TM and canal normal. , Nose without erythema. , Mouth mucous membranes moist , Pharynx without injection or exudate, +maxillary and frontal sinus tenderness. , No oral lesions.   Lungs: clear to auscultation bilaterally. No rales no rhonchi  no wheezing  Cardiovascular: Heart regular rate and rhythm, S1, S2 normal, no murmur, click, rub or  gallop  Neurologic: alert and oriented x3, CN 2-12 grossly intact, B/L upper and lower extremity muscle strength-5/5     Assessment and Plan:    ICD-10-CM    1. Acute non-recurrent frontal sinusitis J01.10    2. Acute non-recurrent maxillary sinusitis J01.00    3. Cough R05      Medication Orders   Medications   . amoxicillin-pot clavulanate (AUGMENTIN) 875-125 mg Oral Tablet     Sig: Take 1 Tab by mouth Every 12 hours for 14 days     Dispense:  28 Tab     Refill:  0   . Methylprednisolone (MEDROL DOSEPACK) 4 mg Oral Tablets, Dose Pack     Sig: Take as instructed.     Dispense:  21 Tab     Refill:  0   . fluticasone propionate (FLONASE) 50 mcg/actuation Nasal Spray, Suspension     Sig: 1 Spray by Each Nostril route Once a day     Dispense:  16 g     Refill:  0    Acute maxillary frontal sinusitis-possibility of recurrence.  The patient states that she sometimes needs longer duration of treatment of Augmentin, I offered her a trial of doxycycline but she would like to week treatment with Augmentin S refraining a new antibiotic.  I also advised her to start Flonase and over-the-counter antihistamine medication.  Because she has shortness of breath on exertion along with wheezing, there is possibility of early bronchitis so will add Medrol Dosepak.    Antibiotics as above, with warnings including allergic reaction, and C. Difficile, both of which can be severe. Patient advised these type of reactions are  unpredictable. Yogurt/probiotics prophylaxis discussed. Pt verbalised understanding to th above plan & verbalised understanding.All questions answered completely.    Plan was discussed and patient/parent verbalized understanding.  If symptoms are not improving within the next couple of days,  advised patient/parent  to followup with primary care or return to the Urgent Care for further evaluation.  Go to Emergency Department immediately for further work up if worsening symptoms or other medical concerns.    Jodean Lima, MD  12/06/2017, 08:19

## 2017-12-06 NOTE — Nursing Note (Signed)
BP 108/66   Pulse 64   Temp 37.1 C (98.8 F) (Oral)   Ht 1.727 m (5\' 8" )   Wt 75.1 kg (165 lb 9.6 oz)   SpO2 97%   BMI 25.18 kg/m     Carloyn Jaeger, RN  12/06/2017, 08:13

## 2017-12-06 NOTE — Patient Instructions (Signed)
URGENT CARE, [place]  [place] [address]  Phone: [phone]  Fax: [phone]           Open Daily 8:00am - 8:00pm, except Sundays 12pm-8pm         ~ Closed Thanksgiving and Christmas Day     Attending Caregiver: Jodean Lima, MD    Today's orders: No orders of the defined types were placed in this encounter.       Prescription(s) E-Rx to:  [place], [address].    ________________________________________________________________________  Short Term Disability and [facility] does NOT provide assistance with any disability applications.  If you feel your medical condition requires you to be on disability, you will need to follow up with  Your primary care physician or a specialist.  We apologize for any inconvenience.    For Medication Prescribed by [facility]:  As an Urgent Care facility, our clinic does NOT offer prescription refills over the telephone.    If you need more of the medication one of our medical providers prescribed, you will  Either need to be re-evaluated by [place] or see your primary care physician.    ________________________________________________________________________      It is very important that we have a phone number that is the single best way to contact you in the event that we become aware of important clinical information or concerns after your discharge.  If the phone number you provided at registration is NOT this number you should inform staff and registration prior to leaving.      Your treatment and evaluation today was focused on identifying and treating potentially emergent conditions based on your presenting signs, symptoms, and history.  The resulting initial clinical impression and treatment plan is not intended to be definitive or a substitute for a full physical examination and evaluation by your primary care provider.  If your symptoms persist, worsen,  or you develop any new or concerning symptoms, you need to be evaluated.      If you received x-rays during your visit, be aware that the final and formal interpretation of those films by a radiologist may occur after your discharge.  If there is a significant discrepancy identified after your discharge, we will contact you at the telephone number provided at registration.      If you received a pelvic exam, you may have cultures pending for sexually transmitted diseases.  Positive cultures are reported to the [place] Department of Health as required by state law.  You should be contacted if you cultures are positive.  We will not contact you if they are negative.  You did NOT receive a PAP smear (the screening test for cervical).  This specific test for women is best performed by your gynecologist or primary care provider when indicated.      If you are over 32 year old, we cannot discuss your personal health information with a parent, spouse, family member, or anyone else without your express consent.  This does not include those who have legitimate access to your records and information to assist in your care under the provisions of HIPAA ([place]) law, or those to whom you have previously given express written consent to do so, such a legal guardian or Power of [place].      You may have received medication that may cause you to feel  drowsy and/or light headed for several hours.  You may even experience some amnesia of your stay.  You should avoid operating a motor vehicle or performing any activity requiring complete alertness or coordination until you feel fully awake (approximately 24-48 hours).  Avoid alcoholic beverages.  You may also have a dry mouth for several hours.  This is a normal side effect and will disappear as the effects of the medication wear off.      Instructions discussed with patient upon discharge by clinical staff with all questions answered.   Please call [facility] [phone] if any further questions.  Go immediately to the emergency department if any concern or worsening symptoms.    Jodean Lima, MD 12/06/2017, 08:19

## 2017-12-08 ENCOUNTER — Telehealth (INDEPENDENT_AMBULATORY_CARE_PROVIDER_SITE_OTHER): Payer: Self-pay | Admitting: Family Medicine

## 2017-12-08 NOTE — Telephone Encounter (Signed)
Pt recently seen in urgent care. Called today to follow up but no answer. Left a message for the  pt to call back if there are any questions or concerns.

## 2017-12-26 ENCOUNTER — Encounter (INDEPENDENT_AMBULATORY_CARE_PROVIDER_SITE_OTHER): Payer: Self-pay

## 2018-01-09 ENCOUNTER — Ambulatory Visit (INDEPENDENT_AMBULATORY_CARE_PROVIDER_SITE_OTHER): Payer: Medicare Other | Admitting: Family Medicine

## 2018-01-09 ENCOUNTER — Encounter (INDEPENDENT_AMBULATORY_CARE_PROVIDER_SITE_OTHER): Payer: Self-pay | Admitting: Family Medicine

## 2018-01-09 ENCOUNTER — Other Ambulatory Visit: Payer: Medicare Other | Attending: Family Medicine

## 2018-01-09 VITALS — BP 115/69 | HR 79 | Temp 98.1°F | Ht 68.0 in | Wt 169.6 lb

## 2018-01-09 DIAGNOSIS — R309 Painful micturition, unspecified: Secondary | ICD-10-CM | POA: Insufficient documentation

## 2018-01-09 MED ORDER — PHENAZOPYRIDINE 200 MG TABLET: 200 mg | Tab | Freq: Three times a day (TID) | ORAL | 0 refills | 0 days | Status: DC | PRN

## 2018-01-09 MED ORDER — SULFAMETHOXAZOLE 800 MG-TRIMETHOPRIM 160 MG TABLET: 1 | Tab | Freq: Two times a day (BID) | ORAL | 0 refills | 0 days | Status: AC

## 2018-01-09 NOTE — Nursing Note (Signed)
01/09/18 1100   Urine   QC Urine yes   Time collected 1125   Glucose Negative   Bilirubin Negative   Ketones Negative   Urine Specific Gravity 1.010   Blood (urine) Negative   pH 7.0   Protein Negative   Urobilinogen 0.2mg /dL (Normal)   Nitrite Negative   Leukocytes Negative   Initials HS

## 2018-01-09 NOTE — Nursing Note (Signed)
BP 115/69   Pulse 79   Temp 36.7 C (98.1 F) (Oral)   Ht 1.727 m (5\' 8" )   Wt 76.9 kg (169 lb 9.6 oz)   SpO2 98%   BMI 25.79 kg/m   Duke Salvia, RN  01/09/2018, 11:27

## 2018-01-09 NOTE — Patient Instructions (Signed)
Dysuria     Painful urination (dysuria) is often caused by a problem in the urinary tract.   Dysuria is pain felt during urination. It is often described as a burning. Learn more about this problem and how it can be treated.  What causes dysuria?  Possible causes include:   Infection with a bacteria or virus such as a urinary tract infection (UTI or a sexually transmittedinfection (STI)   Sensitivity or allergy to chemicals such as those found in lotions and other products   Prostate or bladder problems   Radiation therapy to the pelvic area  How is dysuria diagnosed?  Your healthcare provider will examine you. He or she will ask about your symptoms and health. After talking with you and doing a physical exam, your healthcare provider may know what is causing your dysuria. He or she will usually request a sample of your urine. Tests of your urine, or a "urinalysis," are done. A urinalysis may include:   Looking at the urine sample (visual exam)   Checking for substances (chemical exam)   Looking at a small amount under a microscope (microscopic exam)  Some parts of the urinalysis may be done in the provider's office and some in a lab. And, the urine sample may be checked for bacteria and yeast (urine culture).Your healthcare provider will tell you more about these tests if they are needed.  How is dysuria treated?  Treatment depends on the cause. If you have a bacterial infection, you may need antibiotics. You may be given medicines to make it easier for you to urinate and help relieve pain. Your healthcare provider can tell you more about your treatment options. Untreated, symptoms may get worse.  When to call your healthcare provider  Call the healthcare provider right away if you have any of the following:   Fever of100.15F(38C) or higher   No improvement after three days of treatment   Trouble urinating because of pain   New or increased discharge from the vagina or penis   Rash or joint  pain   Increased back or abdominal pain   Enlarged painful lymph nodes (lumps) in the groinTh   Date Last Reviewed: 06/06/2015   2000-2019 [employer]. [address], [place] [address]. All rights reserved. This information is not intended as a substitute for professional medical care. Always follow your healthcare professional's instructions.        [facility]  [address] [place]  [place], [place] [address]  Phone: [phone]  Fax: [phone]             Open Daily 8:00am - 8:00pm, except Sundays 12pm-8pm         ~ Closed Thanksgiving and Christmas Day     Attending Caregiver: Nicole Crandall, MD      Today's orders:   Orders Placed This Encounter   . POCT URINE DIPSTICK   . trimethoprim-sulfamethoxazole (BACTRIM DS) 160-800mg  per tablet        Prescription(s) E-Rx to:  [place], [place]    ________________________________________________________________________  Short Term Disability and [facility] does NOT provide assistance with any disability applications.  If you feel your medical condition requires you to be on disability, you will need to follow up with  Your primary care physician or a specialist.  We apologize for any inconvenience.    For Medication Prescribed  by [facility]:  As an Urgent Care facility, our clinic does NOT offer prescription refills over the telephone.    If you need more of the medication one of our medical providers prescribed, you will  Either need to be re-evaluated by [place] or see your primary care physician.    ________________________________________________________________________      It is very important that we have a phone number that is the single best way to contact you in the event that we become aware of important clinical information or concerns after your discharge.  If the phone number you provided at registration is NOT this number you  should inform staff and registration prior to leaving.      Your treatment and evaluation today was focused on identifying and treating potentially emergent conditions based on your presenting signs, symptoms, and history.  The resulting initial clinical impression and treatment plan is not intended to be definitive or a substitute for a full physical examination and evaluation by your primary care provider.  If your symptoms persist, worsen, or you develop any new or concerning symptoms, you need to be evaluated.      If you received x-rays during your visit, be aware that the final and formal interpretation of those films by a radiologist may occur after your discharge.  If there is a significant discrepancy identified after your discharge, we will contact you at the telephone number provided at registration.      If you received a pelvic exam, you may have cultures pending for sexually transmitted diseases.  Positive cultures are reported to the [place] Department of Health as required by state law.  You should be contacted if you cultures are positive.  We will not contact you if they are negative.  You did NOT receive a PAP smear (the screening test for cervical).  This specific test for women is best performed by your gynecologist or primary care provider when indicated.      If you are over 61 year old, we cannot discuss your personal health information with a parent, spouse, family member, or anyone else without your express consent.  This does not include those who have legitimate access to your records and information to assist in your care under the provisions of HIPAA ([place]) law, or those to whom you have previously given express written consent to do so, such a legal guardian or Power of [place].      You may have received medication that may cause you to feel drowsy and/or light headed for several hours.  You may even experience some amnesia of your stay.  You  should avoid operating a motor vehicle or performing any activity requiring complete alertness or coordination until you feel fully awake (approximately 24-48 hours).  Avoid alcoholic beverages.  You may also have a dry mouth for several hours.  This is a normal side effect and will disappear as the effects of the medication wear off.      Instructions discussed with patient upon discharge by clinical staff with all questions answered.  Please call [facility] ([phone]) if any further questions.  Go immediately to the emergency department if any concern or worsening symptoms.      Nicole Crandall, MD 01/09/2018, 11:42

## 2018-01-09 NOTE — Progress Notes (Signed)
[facility]  [address] [place] [address]  Dept: [phone]    Patient name .[name]  Date of birth:12/13/1950    Date of service:01/09/18    CHIEF COMPLAINT:  Chief Complaint   Patient presents with   . Urinary Pain       SUBJECTIVE: Nicole Bucy Steptoeis a 67 y.o. female presents to the urgent care today complaining of one-week history of dysuria and frequency of urination, back pain.  Reports that she is prone for or urinary tract infections since the birth of her son and has had urinary incontinence and wears diapers , as she has gotten older the frequent UTI has become more frequent.  Denies fever, chills, nausea, vomiting, hematuria, vaginal discharge.  She had a leftover prescription of Bactrim which she had taken last night which did not seem to help.  Reports that she has been hydrating well.  His    Past medical history: No hx of repeated UTI,no hx of DM.  Social history: No exposure to passive smoking.     Review of Systems   Pertinent items are noted in HPI. All pertinent positives and negatives noted in the HPI  Constitutional: No recent illness, no fever, no chills.  Neuro: No dizziness.  Muskuloskeletal: No myalgias, No arthralgias.  GI.no abdominal pain,nausea,vomiting,diarrhea.  GU. + dysuria,frequency of urination.  Skin: no rashes.    OBJECTIVE:  Vitals:    01/09/18 1124   BP: 115/69   Pulse: 79   Temp: 36.7 C (98.1 F)   TempSrc: Oral   SpO2: 98%   Weight: 76.9 kg (169 lb 9.6 oz)   Height: 1.727 m (5\' 8" )   BMI: 25.84     Appears well, in no apparent distress.  The abdomen is soft without tenderness, guarding, mass, rebound or organomegaly. No CVA tenderness or inguinal adenopathy noted.  HEENT:  Mucous membranes moist.  Heart :  Regular rate and rhythm, no murmur.   Skin :no rashes on exposed areas of skin.  CNS: A & O x 3.  Extremities:  No edema, no cyanosis, positive bilateral peripheral pulses.          LABS:   Urine Dip Results:   Time collected: 1125  Glucose: Negative  Bilirubin: Negative  Ketones: Negative  Urine Specific Gravity: 1.010  Blood (urine): Negative  Protein: Negative  Urobilinogen: 0.2mg /dL (Normal)  Nitrite: Negative  Leukocytes: Negative      Assessment and plan:   Dysuria.  Discussed with patient that her UA was negative today, Given a prescription for , pyridium with precautions that it may get discolor urine, plenty of p.o. hydration, will obtain urine culture sensitivity and if resistant to the current antibiotic .Antibiotics as above, with warnings including allergic reaction, and C. Difficile, both of which can be severe. Patient advised these type of reactions are  unpredictable. Yogurt/probiotics prophylaxis discussed. Patient agrees with the above plan and verbalized understanding.will inform patient and switch antibiotics.patient agree with the above plan and verbalized understanding.  All questions answered      Portions of this note may be dictated using voice recognition software . Variances in spelling and vocabulary are possible and unintentional. Not all errors are caught/corrected. Please notify the [facility] if any discrepancies are noted or if the meaning of any statement is not clear.  Maura Crandall, MD  01/09/2018, 11:41

## 2018-01-10 LAB — URINE CULTURE: URINE CULTURE: NO GROWTH

## 2018-01-10 NOTE — Progress Notes (Signed)
[name]  Your  urine  culture was negative,follow up with your doctor if symptoms not improved.

## 2018-01-11 ENCOUNTER — Encounter (INDEPENDENT_AMBULATORY_CARE_PROVIDER_SITE_OTHER): Payer: Self-pay

## 2018-01-11 NOTE — Nursing Note (Signed)
Left a courtesy call back message for patient to return my call should there be any questions or concerns regarding their recent visit to urgent care.   [name] RTR

## 2018-01-25 ENCOUNTER — Ambulatory Visit (HOSPITAL_BASED_OUTPATIENT_CLINIC_OR_DEPARTMENT_OTHER): Payer: Medicare Other | Attending: Family Medicine

## 2018-01-25 ENCOUNTER — Ambulatory Visit: Payer: Medicare Other | Attending: Family Medicine

## 2018-01-25 ENCOUNTER — Ambulatory Visit (INDEPENDENT_AMBULATORY_CARE_PROVIDER_SITE_OTHER): Payer: Medicare Other | Admitting: Family Medicine

## 2018-01-25 ENCOUNTER — Encounter (INDEPENDENT_AMBULATORY_CARE_PROVIDER_SITE_OTHER): Payer: Self-pay | Admitting: Family Medicine

## 2018-01-25 VITALS — BP 104/72 | HR 68 | Temp 97.4°F | Resp 18 | Ht 68.0 in | Wt 166.4 lb

## 2018-01-25 DIAGNOSIS — Z9109 Other allergy status, other than to drugs and biological substances: Secondary | ICD-10-CM

## 2018-01-25 DIAGNOSIS — K529 Noninfective gastroenteritis and colitis, unspecified: Secondary | ICD-10-CM | POA: Insufficient documentation

## 2018-01-25 DIAGNOSIS — Z1239 Encounter for other screening for malignant neoplasm of breast: Secondary | ICD-10-CM

## 2018-01-25 LAB — CBC W/AUTO DIFF
BASOPHIL #: 0 x10ˆ3/uL (ref 0.00–0.10)
BASOPHIL %: 1 % (ref 0–3)
BASOPHIL %: 1 % (ref 0–3)
EOSINOPHIL #: 0.1 x10ˆ3/uL (ref 0.00–0.50)
EOSINOPHIL %: 4 % (ref 0–5)
HCT: 35.8 % — ABNORMAL LOW (ref 36.0–45.0)
HGB: 11.8 g/dL — ABNORMAL LOW (ref 12.0–15.5)
LYMPHOCYTE #: 1.2 x10ˆ3/uL (ref 1.00–4.80)
LYMPHOCYTE %: 29 % (ref 15–43)
MCH: 32.9 pg (ref 27.5–33.2)
MCHC: 32.9 g/dL (ref 32.0–36.0)
MCV: 100.1 fL — ABNORMAL HIGH (ref 82.0–97.0)
MONOCYTE #: 0.4 x10ˆ3/uL (ref 0.20–0.90)
MONOCYTE %: 10 % (ref 5–12)
MPV: 9 fL (ref 7.4–10.5)
NEUTROPHIL #: 2.3 x10ˆ3/uL (ref 1.50–6.50)
NEUTROPHIL %: 56 % (ref 43–76)
PLATELETS: 178 x10ˆ3/uL (ref 150–450)
RBC: 3.57 x10ˆ6/uL — ABNORMAL LOW (ref 4.00–5.10)
RDW: 13.2 % (ref 11.0–16.0)
WBC: 4.2 x10ˆ3/uL (ref 4.0–11.0)

## 2018-01-25 LAB — COMPREHENSIVE METABOLIC PROFILE - BMC/JMC ONLY
ALBUMIN/GLOBULIN RATIO: 1.8 (ref 0.8–2.0)
ALBUMIN: 3.8 g/dL (ref 3.5–5.0)
ALKALINE PHOSPHATASE: 39 U/L (ref 38–126)
ALT (SGPT): 56 U/L — ABNORMAL HIGH (ref 14–54)
ANION GAP: 6 mmol/L (ref 3–11)
ANION GAP: 6 mmol/L (ref 3–11)
AST (SGOT): 23 U/L (ref 15–41)
BILIRUBIN TOTAL: 0.6 mg/dL (ref 0.3–1.2)
BUN/CREA RATIO: 37 — ABNORMAL HIGH (ref 6–22)
BUN: 21 mg/dL — ABNORMAL HIGH (ref 6–20)
CALCIUM: 9.1 mg/dL (ref 8.8–10.2)
CHLORIDE: 108 mmol/L (ref 101–111)
CO2 TOTAL: 25 mmol/L (ref 22–32)
CREATININE: 0.57 mg/dL (ref 0.44–1.00)
ESTIMATED GFR: >60 mL/min/1.73mˆ2 (ref >60)
GLUCOSE: 98 mg/dL (ref 70–110)
GLUCOSE: 98 mg/dL (ref 70–110)
POTASSIUM: 3.7 mmol/L (ref 3.4–5.1)
PROTEIN TOTAL: 5.9 g/dL — ABNORMAL LOW (ref 6.4–8.3)
PROTEIN TOTAL: 5.9 g/dL — ABNORMAL LOW (ref 6.4–8.3)
SODIUM: 139 mmol/L (ref 136–145)

## 2018-01-25 MED ORDER — FLUTICASONE PROPIONATE 50 MCG/ACTUATION NASAL SPRAY,SUSPENSION
1.0000 | Freq: Every day | NASAL | 5 refills | Status: DC
Start: 2018-01-25 — End: 2018-11-27

## 2018-01-25 NOTE — Nursing Note (Signed)
Chief Complaint:   Chief Complaint            Diarrhea         Functional Health Screen        Vital Signs  BP 104/72   Pulse 68   Temp 36.3 C (97.4 F) (Oral)   Resp 18   Ht 1.727 m (5\' 8" )   Wt 75.5 kg (166 lb 6.4 oz)   BMI 25.30 kg/m       Social History     Tobacco Use   Smoking Status Never Smoker   Smokeless Tobacco Never Used     Allergies  Allergies   Allergen Reactions   . Percocet [Oxycodone-Acetaminophen] Nausea/ Vomiting   . Polyethylene Glycol Hives/ Urticaria   . Polyethylene Glycol 3350 Hives/ Urticaria   . Beesting [Hymenoptera Allergenic Extract]    . Procaine  Other Adverse Reaction (Add comment)   . Venom-Wasp      Medication History  Reviewed for OTC medication and any new medications, provider will review medication history  Care Team  Patient Care Team:  [name], MD as PCP - General ([place])  Britt Bottom, MD ([facility])  Immunizations - last 24 hours     None        [place], [place]  01/25/2018, 14:55

## 2018-01-26 LAB — GI PANEL BY BIOFIRE FILM ARRAY (BMC/BRX/JMC/JAX)
ADENOVIRUS F 40/41: NOT DETECTED
ASTROVIRUS: NOT DETECTED
CAMPYLOBACTER: NOT DETECTED
CLOSTRIDIUM DIFFICILE TOXIN A/B: NOT DETECTED
CRYPTOSPORIDIUM: NOT DETECTED
CYCLOSPORA CAYETANENSIS: NOT DETECTED
ENTAMOEBA HISTOLYTICA: NOT DETECTED
ENTEROAGGREGATIVE E. COLI (EAEC): NOT DETECTED
ENTEROPATHOGENIC E COLI (EPEC): NOT DETECTED
ENTEROTOXIGENIC E COLI (ETEC) LT/ST: NOT DETECTED
GIARDIA LAMBLIA: NOT DETECTED
NOROVIRUS GI/GII: NOT DETECTED
PLESIOMONAS SHIGELLOIDES: NOT DETECTED
ROTAVIRUS A: NOT DETECTED
SALMONELLA: NOT DETECTED
SAPOVIRUS: NOT DETECTED
SHIGA-LIKE TOXIN-PRODUCING E COLI (STEC) STX1/STX2: NOT DETECTED
SHIGELLA/ENTEROINVASIVE E COLI (EIEC): NOT DETECTED
VIBRIO CHOLERAE: NOT DETECTED
VIBRIO: NOT DETECTED
YERSINIA ENTEROCOLITICA: NOT DETECTED

## 2018-01-26 LAB — GI PANEL BY BIOFIRE FILM ARRAY
ASTROVIRUS: NOT DETECTED
ROTAVIRUS A: NOT DETECTED

## 2018-01-28 ENCOUNTER — Telehealth (INDEPENDENT_AMBULATORY_CARE_PROVIDER_SITE_OTHER): Payer: Self-pay | Admitting: Family Medicine

## 2018-01-28 NOTE — Telephone Encounter (Signed)
Followed up on acute visit from 01/25/2018. Patient says she is feeling better. No questions or concerns at this time.  Ivory Broad, MA  01/28/2018, 14:21

## 2018-01-29 ENCOUNTER — Telehealth (INDEPENDENT_AMBULATORY_CARE_PROVIDER_SITE_OTHER): Payer: Self-pay | Admitting: Family Medicine

## 2018-01-29 DIAGNOSIS — D539 Nutritional anemia, unspecified: Secondary | ICD-10-CM

## 2018-01-29 NOTE — Telephone Encounter (Signed)
Her labs showed some mild anemia and due to her recent diet I would like to repeat her vit B12 and folate levels. Stool test was negative for infection.

## 2018-01-29 NOTE — Telephone Encounter (Signed)
Patient notified.  Ivory Broad, MA  01/29/2018, 14:39

## 2018-02-07 ENCOUNTER — Ambulatory Visit: Payer: Medicare Other | Attending: Family Medicine

## 2018-02-07 DIAGNOSIS — D539 Nutritional anemia, unspecified: Principal | ICD-10-CM | POA: Insufficient documentation

## 2018-02-08 LAB — VITAMIN B12: VITAMIN B 12: 434 pg/mL (ref 180–914)

## 2018-02-08 LAB — FOLATE: FOLATE: >23 ng/mL (ref >=4.5)

## 2018-02-11 ENCOUNTER — Ambulatory Visit (INDEPENDENT_AMBULATORY_CARE_PROVIDER_SITE_OTHER): Payer: Medicare Other | Admitting: Family Medicine

## 2018-02-11 ENCOUNTER — Encounter (INDEPENDENT_AMBULATORY_CARE_PROVIDER_SITE_OTHER): Payer: Self-pay | Admitting: Family Medicine

## 2018-02-11 VITALS — BP 118/72 | HR 76 | Temp 98.1°F | Resp 18 | Ht 68.0 in | Wt 165.0 lb

## 2018-02-11 DIAGNOSIS — R197 Diarrhea, unspecified: Secondary | ICD-10-CM

## 2018-02-11 MED ORDER — RIFAXIMIN 550 MG TABLET
550.0000 mg | ORAL_TABLET | Freq: Two times a day (BID) | ORAL | 0 refills | Status: DC
Start: 2018-02-11 — End: 2018-11-27

## 2018-02-11 NOTE — Nursing Note (Signed)
Chief Complaint:   Chief Complaint            Diarrhea         Functional Health Screen        Vital Signs  BP 118/72   Pulse 76   Temp 36.7 C (98.1 F) (Oral)   Resp 18   Ht 1.727 m (5\' 8" )   Wt 74.8 kg (165 lb)   BMI 25.09 kg/m       Social History     Tobacco Use   Smoking Status Never Smoker   Smokeless Tobacco Never Used     Allergies  Allergies   Allergen Reactions   . Percocet [Oxycodone-Acetaminophen] Nausea/ Vomiting   . Polyethylene Glycol Hives/ Urticaria   . Polyethylene Glycol 3350 Hives/ Urticaria   . Beesting [Hymenoptera Allergenic Extract]    . Procaine  Other Adverse Reaction (Add comment)   . Venom-Wasp      Medication History  Reviewed for OTC medication and any new medications, provider will review medication history  Care Team  Patient Care Team:  [name], MD as PCP - General ([place])  Britt Bottom, MD ([facility])  Immunizations - last 24 hours     None        Ivory Broad, [place]  02/11/2018, 14:27

## 2018-02-12 ENCOUNTER — Ambulatory Visit (INDEPENDENT_AMBULATORY_CARE_PROVIDER_SITE_OTHER): Payer: Medicare Other | Admitting: Family Medicine

## 2018-02-16 ENCOUNTER — Ambulatory Visit: Payer: Medicare Other

## 2018-02-17 NOTE — Progress Notes (Signed)
[name], MOB1  [address]  [facility] [address]     Outpatient Visit Note (Assment/Plan;Subjective;Objective)     Name: [name] MRN:  [id number]   Date: 01/25/2018 Age: 67 y.o.     Assessment & Plan:       ICD-10-CM    1. Chronic diarrhea  Will check labs and stool study K52.9 GI PANEL BY BIOFIRE FILM ARRAY     COMPREHENSIVE METABOLIC PROFILE - [facility] ONLY     CBC W/AUTO DIFF - [facility] ONLY   2. Breast cancer screening Z12.31 MAMMO SCREENING BILATERAL W/ TOMO   3. Environmental allergies   Refill request Z91.09 fluticasone propionate (FLONASE) 50 mcg/actuation Nasal Spray, Suspension     Problem List Items Addressed This Visit     None      Visit Diagnoses     Chronic diarrhea    -  Primary    Relevant Orders    GI PANEL BY BIOFIRE FILM ARRAY (Completed)    COMPREHENSIVE METABOLIC PROFILE - [facility] ONLY (Completed)    CBC W/AUTO DIFF - [facility] ONLY (Completed)    Breast cancer screening        Relevant Orders    MAMMO SCREENING BILATERAL W/ TOMO    Environmental allergies        Relevant Medications    fluticasone propionate (FLONASE) 50 mcg/actuation Nasal Spray, Suspension        Orders Placed This Encounter   . GI PANEL BY BIOFIRE FILM ARRAY   . MAMMO SCREENING BILATERAL W/ TOMO   . COMPREHENSIVE METABOLIC PROFILE - [facility] ONLY   . CBC W/AUTO DIFF - [facility] ONLY   . fluticasone propionate (FLONASE) 50 mcg/actuation Nasal Spray, Suspension     Current Outpatient Medications   Medication Sig   . calcium-vits D3-C-K2-minerals 166.75 mg- 166.75 unit Oral Capsule calcium   . coenzyme Q10 300 mg Oral Capsule Take 300 mg by mouth   . diclofenac sodium (VOLTAREN) 50 mg Oral Tablet, Delayed Release (E.C.)    . EPINEPHrine 0.3 mg/0.3 mL Injection Auto-Injector 0.3 mL (0.3 mg total) by Intramuscular route Once, as needed for up to 1 dose   . fluticasone propionate (FLONASE) 50 mcg/actuation Nasal Spray, Suspension 1 Spray by Each Nostril route Once a day   . fluticasone propionate (FLONASE) 50  mcg/actuation Nasal Spray, Suspension 1 Spray by Each Nostril route Once a day   . galcanezumab-gnlm (EMGALITY SYRINGE SUBQ) by Subcutaneous route   . Glucosamine HCl 1,500 mg Oral Tablet Take by mouth   . lidocaine (XYLOCAINE) 5 % Ointment Apply topically   . Magnesium 30 mg Oral Tablet magnesium   . MELOXICAM ORAL Take by mouth   . omega-3s/dha/epa/fish oil/D3 (VITAMIN-D + OMEGA-3 ORAL) Vitamin D   . phenazopyridine (PYRIDIUM) 200 mg Oral Tablet Take 1 Tab (200 mg total) by mouth Three times a day as needed for Pain   . rifAXIMin (XIFAXAN) 550 mg Oral Tablet Take 1 Tab (550 mg total) by mouth Twice daily   . Zolmitriptan (ZOMIG) 5 mg Oral Tablet TAKE AT ONSET OF HEADACHE, MAY REPEAT IN 2 HOURS IF NEEDED. MAX OF 2 PER DAY MAX OF 3 DAYS PER WEEK             Follow up: prn-1 month    Subjective:   CC:   Chief Complaint   Patient presents with   . Diarrhea     [name] is a 67 y.o. female here for  Diarrhea  .      1. Diarrhea-Patient states for the past 3 months she has had frequent liquid stool resulting in incontinence at times.  She reports being on the keto diet as well as two courses of abx this summer.  She denies blood in the stools,abdominal pain or gas.  States that she thinks it is due to her diet.  Her last colonoscopy showed polyps.  She has had no recent alternating problems with constipation.    Objective:   BP 104/72   Pulse 68   Temp 36.3 C (97.4 F) (Oral)   Resp 18   Ht 1.727 m (5\' 8" )   Wt 75.5 kg (166 lb 6.4 oz)   BMI 25.30 kg/m      Body mass index is 25.3 kg/m.    No LMP recorded. Patient is postmenopausal.  GEN: NAD  LUNGS: CTA  B, anteriorly and posteriorly  HRT: RRR, neg murmur  ABD: normal bowel sounds, soft, nontender, nondistended, no hepatosplenomegaly  EXT: No edema      [name], MD  [place], MOB1  [address]  [place] [address]  Dept: [phone]  Dept Fax: [phone]  Loc: [phone]  Loc Fax:  [phone]

## 2018-02-24 NOTE — Progress Notes (Signed)
[facility]  [address]  [place] [address]     Outpatient Visit Note (Assment/Plan;Subjective;Objective)     Name: [name] MRN:  [id number]   Date: 02/11/2018 Age: 67 y.o.     Assessment & Plan:       ICD-10-CM    1. Diarrhea, unspecified type  Due to h/o past constipation question an IBS component.  Also advise adding complex carbs back to diet.  Check RUQ [place] for GBD.   R19.7 rifAXIMin (XIFAXAN) 550 mg Oral Tablet     US ABDOMEN RIGHT UPPER QUADRANT     Problem List Items Addressed This Visit     None      Visit Diagnoses     Diarrhea, unspecified type    -  Primary    Relevant Medications    rifAXIMin (XIFAXAN) 550 mg Oral Tablet    Other Relevant Orders    US ABDOMEN RIGHT UPPER QUADRANT        Orders Placed This Encounter   . US ABDOMEN RIGHT UPPER QUADRANT   . rifAXIMin (XIFAXAN) 550 mg Oral Tablet     Current Outpatient Medications   Medication Sig   . calcium-vits D3-C-K2-minerals 166.75 mg- 166.75 unit Oral Capsule calcium   . coenzyme Q10 300 mg Oral Capsule Take 300 mg by mouth   . diclofenac sodium (VOLTAREN) 50 mg Oral Tablet, Delayed Release (E.C.)    . EPINEPHrine 0.3 mg/0.3 mL Injection Auto-Injector 0.3 mL (0.3 mg total) by Intramuscular route Once, as needed for up to 1 dose   . fluticasone propionate (FLONASE) 50 mcg/actuation Nasal Spray, Suspension 1 Spray by Each Nostril route Once a day   . fluticasone propionate (FLONASE) 50 mcg/actuation Nasal Spray, Suspension 1 Spray by Each Nostril route Once a day   . galcanezumab-gnlm (EMGALITY SYRINGE SUBQ) by Subcutaneous route   . Glucosamine HCl 1,500 mg Oral Tablet Take by mouth   . lidocaine (XYLOCAINE) 5 % Ointment Apply topically   . Magnesium 30 mg Oral Tablet magnesium   . MELOXICAM ORAL Take by mouth   . omega-3s/dha/epa/fish oil/D3 (VITAMIN-D + OMEGA-3 ORAL) Vitamin D   . phenazopyridine (PYRIDIUM) 200 mg Oral Tablet Take 1 Tab (200 mg total) by mouth Three times a day as needed for Pain   . rifAXIMin  (XIFAXAN) 550 mg Oral Tablet Take 1 Tab (550 mg total) by mouth Twice daily   . Zolmitriptan (ZOMIG) 5 mg Oral Tablet TAKE AT ONSET OF HEADACHE, MAY REPEAT IN 2 HOURS IF NEEDED. MAX OF 2 PER DAY MAX OF 3 DAYS PER WEEK             Follow up: prn    Subjective:   CC:   Chief Complaint   Patient presents with   . Diarrhea     [name] is a 67 y.o. female here for Diarrhea  .    Patient continues to have frequent diarrhea and feels that it is worse. She denies nausea or vomiting. Thinks it maybe due to the keto diet that she has been eating.  She denies abdominal pain or bloody stools    Objective:   BP 118/72   Pulse 76   Temp 36.7 C (98.1 F) (Oral)   Resp 18   Ht 1.727 m (5\' 8" )   Wt 74.8 kg (165 lb)   BMI 25.09 kg/m      Body mass index is 25.09 kg/m.    No LMP recorded.  Patient is postmenopausal.  GEN: NAD  LUNGS: CTA  B, anteriorly and posteriorly  HRT: RRR, neg murmur  ABD: normal bowel sounds, soft, nontender, nondistended, no hepatosplenomegaly  EXT: No edema      [name], MD  [facility], [facility] [address]  [place]  [place] [address]  Dept: [phone]  Dept Fax: [phone]  Loc: [phone]  Loc Fax: [phone]

## 2018-02-26 ENCOUNTER — Ambulatory Visit
Admission: RE | Admit: 2018-02-26 | Discharge: 2018-02-26 | Disposition: A | Discharge status: Home / Self Care | Payer: Medicare Other | Source: Ambulatory Visit | Attending: Family Medicine | Admitting: Family Medicine

## 2018-02-26 DIAGNOSIS — R197 Diarrhea, unspecified: Secondary | ICD-10-CM | POA: Insufficient documentation

## 2018-02-28 ENCOUNTER — Encounter (HOSPITAL_BASED_OUTPATIENT_CLINIC_OR_DEPARTMENT_OTHER): Payer: Self-pay

## 2018-02-28 ENCOUNTER — Ambulatory Visit (HOSPITAL_BASED_OUTPATIENT_CLINIC_OR_DEPARTMENT_OTHER)
Admission: RE | Admit: 2018-02-28 | Discharge: 2018-02-28 | Disposition: A | Discharge status: Home / Self Care | Payer: Medicare Other | Source: Ambulatory Visit | Attending: Family Medicine | Admitting: Family Medicine

## 2018-02-28 DIAGNOSIS — Z1239 Encounter for other screening for malignant neoplasm of breast: Secondary | ICD-10-CM

## 2018-02-28 DIAGNOSIS — Z1231 Encounter for screening mammogram for malignant neoplasm of breast: Secondary | ICD-10-CM | POA: Insufficient documentation

## 2018-03-01 ENCOUNTER — Encounter (INDEPENDENT_AMBULATORY_CARE_PROVIDER_SITE_OTHER): Payer: Self-pay | Admitting: Family Medicine

## 2018-03-01 ENCOUNTER — Ambulatory Visit (INDEPENDENT_AMBULATORY_CARE_PROVIDER_SITE_OTHER): Payer: Medicare Other | Admitting: Family Medicine

## 2018-03-01 ENCOUNTER — Other Ambulatory Visit: Payer: Medicare Other | Attending: Family Medicine

## 2018-03-01 VITALS — BP 133/82 | HR 82 | Temp 98.1°F | Ht 68.0 in | Wt 165.0 lb

## 2018-03-01 DIAGNOSIS — N3001 Acute cystitis with hematuria: Secondary | ICD-10-CM | POA: Insufficient documentation

## 2018-03-01 DIAGNOSIS — R300 Dysuria: Secondary | ICD-10-CM

## 2018-03-01 MED ORDER — CEPHALEXIN 500 MG CAPSULE: 500 mg | Cap | Freq: Two times a day (BID) | ORAL | 0 refills | 0 days | Status: AC

## 2018-03-01 NOTE — Patient Instructions (Addendum)
[address] [place]  [address]  [place] [address]  Phone: [phone]  Fax: [phone]           Open Daily 8:00am - 8:00pm, except Sundays 12pm-8pm         ~ Closed Thanksgiving and Christmas Day     Attending Caregiver: Valentina Shaggy, MD    Today's orders:   Orders Placed This Encounter   . CANCELED: URINE CULTURE   . URINE CULTURE   . POCT URINE DIPSTICK   . cephalexin (KEFLEX) 500 mg Oral Capsule        Prescription(s) E-Rx to:  [facility] [address].    ________________________________________________________________________  Short Term Disability and [facility] does NOT provide assistance with any disability applications.  If you feel your medical condition requires you to be on disability, you will need to follow up with  Your primary care physician or a specialist.  We apologize for any inconvenience.    For Medication Prescribed by [facility]:  As an Urgent Care facility, our clinic does NOT offer prescription refills over the telephone.    If you need more of the medication one of our medical providers prescribed, you will  Either need to be re-evaluated by [place] or see your primary care physician.    ________________________________________________________________________      It is very important that we have a phone number that is the single best way to contact you in the event that we become aware of important clinical information or concerns after your discharge.  If the phone number you provided at registration is NOT this number you should inform staff and registration prior to leaving.      Your treatment and evaluation today was focused on identifying and treating potentially emergent conditions based on your presenting signs, symptoms, and history.  The resulting initial clinical impression and treatment plan is not intended to be definitive or a substitute for a full physical examination  and evaluation by your primary care provider.  If your symptoms persist, worsen, or you develop any new or concerning symptoms, you need to be evaluated.      If you received x-rays during your visit, be aware that the final and formal interpretation of those films by a radiologist may occur after your discharge.  If there is a significant discrepancy identified after your discharge, we will contact you at the telephone number provided at registration.      If you received a pelvic exam, you may have cultures pending for sexually transmitted diseases.  Positive cultures are reported to the [place] Department of Health as required by state law.  You should be contacted if you cultures are positive.  We will not contact you if they are negative.  You did NOT receive a PAP smear (the screening test for cervical).  This specific test for women is best performed by your gynecologist or primary care provider when indicated.      If you are over 40 year old, we cannot discuss your personal health information with a parent, spouse, family member, or anyone else without your express consent.  This does not include those who have legitimate access to your records and information to assist in your care under the provisions of HIPAA ([place]) law, or those to whom you have previously given express written consent to do so, such a  legal guardian or Power of [place].      You may have received medication that may cause you to feel drowsy and/or light headed for several hours.  You may even experience some amnesia of your stay.  You should avoid operating a motor vehicle or performing any activity requiring complete alertness or coordination until you feel fully awake (approximately 24-48 hours).  Avoid alcoholic beverages.  You may also have a dry mouth for several hours.  This is a normal side effect and will disappear as the effects of the medication wear off.      Instructions discussed  with patient upon discharge by clinical staff with all questions answered.  Please call [facility] [phone] if any further questions.  Go immediately to the emergency department if any concern or worsening symptoms.    Valentina Shaggy, MD 03/01/2018, 10:56

## 2018-03-01 NOTE — Progress Notes (Signed)
[address] [place]  [address]  [place] [address]       Name: [name] MRN:  [id number]   Date: 03/01/2018 Age: 67 y.o.     SUBJECTIVE: [name] is a 67 y.o. female who complains of urinary frequency, urgency and dysuria x 3 days, pain feels like burning, without fever, chills, or abnormal vaginal discharge but pt has noticed blood on the toilet paper when she wipes. Pt has a h/o chronic diarrhea and wears a pad for incontinence, pt feels that this is contributing to her h/o UTIs.     ROS: no fevers, denies headaches, feels that she has some wheezing sometimes but does not have shortness of breath, denies vomiting but has chronic diarrhea, no weakness or difficulty with ambulation, denies skin rash or lesion    Social History     Socioeconomic History   . Marital status: Married     Spouse name: Not on file   . Number of children: Not on file   . Years of education: Not on file   . Highest education level: Not on file   Occupational History   . 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 Smoking status: Never Smoker   . Smokeless tobacco: Never Used   Substance and Sexual Activity   . Alcohol use: No   . Drug use: No   . Sexual activity: Yes     Partners: Male   Other Topics Concern     Allergy History as of 03/01/18     OXYCODONE-ACETAMINOPHEN       Noted Status Severity Type Reaction    09/20/09 1055 Sowers, [place] 09/20/09 Active High Side Effect Nausea/ Vomiting          HYMENOPTERA ALLERGENIC EXTRACT       Noted Status Severity Type Reaction    09/16/15 1502 Eulas Post, MA 09/16/15 Active             PROCAINE       Noted Status Severity Type Reaction    03/31/17 1653 Isidoro Donning, RTR 11/05/13 Active    Other Adverse Reaction (Add comment)          POLYETHYLENE GLYCOL 3350       Noted Status Severity Type Reaction    11/13/17 1443 Raeford Razor, CMA 06/05/16 Active High  Hives/ Urticaria           POLYETHYLENE GLYCOL       Noted Status Severity Type Reaction    11/13/17 1443 Raeford Razor, CMA 06/05/16 Active High  Hives/ Urticaria          VENOM-WASP       Noted Status Severity Type Reaction    11/13/17 1443 Raeford Razor, [place] 09/16/15 Active                 Patient Active Problem List   Diagnosis   . Pancytopenia   . Left lower quadrant pain   . Iron deficiency   . Fatigue   . Dyspnea   . 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[facility])     Family Medical History:     Problem Relation (Age of Onset)    Asthma Mother, Sister, Brother    Colon Cancer Mother, Father    Congestive Heart Failure Mother    Diabetes Father    No Known Problems Daughter, Maternal Grandmother, Maternal Grandfather, Paternal  Grandmother, Paternal Grandfather, Son, Maternal Aunt, Maternal Uncle, Paternal 72, Paternal Uncle, Other          Outpatient Medications Prior to Visit:  calcium-vits D3-C-K2-minerals 166.75 mg- 166.75 unit Oral Capsule calcium   coenzyme Q10 300 mg Oral Capsule Take 300 mg by mouth   diclofenac sodium (VOLTAREN) 50 mg Oral Tablet, Delayed Release (E.C.)    EPINEPHrine 0.3 mg/0.3 mL Injection Auto-Injector 0.3 mL (0.3 mg total) by Intramuscular route Once, as needed for up to 1 dose   fluticasone propionate (FLONASE) 50 mcg/actuation Nasal Spray, Suspension 1 Spray by Each Nostril route Once a day   fluticasone propionate (FLONASE) 50 mcg/actuation Nasal Spray, Suspension 1 Spray by Each Nostril route Once a day   galcanezumab-gnlm (EMGALITY SYRINGE SUBQ) by Subcutaneous route   Glucosamine HCl 1,500 mg Oral Tablet Take by mouth   lidocaine (XYLOCAINE) 5 % Ointment Apply topically   Magnesium 30 mg Oral Tablet magnesium   MELOXICAM ORAL Take by mouth   omega-3s/dha/epa/fish oil/D3 (VITAMIN-D + OMEGA-3 ORAL) Vitamin D   phenazopyridine (PYRIDIUM) 200 mg Oral Tablet Take 1 Tab (200 mg total) by mouth Three times a day as needed for Pain   rifAXIMin (XIFAXAN) 550 mg Oral Tablet Take 1 Tab (550 mg total) by mouth Twice daily      Zolmitriptan (ZOMIG) 5 mg Oral Tablet TAKE AT ONSET OF HEADACHE, MAY REPEAT IN 2 HOURS IF NEEDED. MAX OF 2 PER DAY MAX OF 3 DAYS PER WEEK     No facility-administered medications prior to visit.     OBJECTIVE: BP 133/82   Pulse 82   Temp 36.7 C (98.1 F) (Oral)   Ht 1.727 m (5\' 8" )   Wt 74.8 kg (165 lb)   SpO2 98%   BMI 25.09 kg/m     Appears well, in no apparent distress.   General: appears in good health, appears stated age and no distress  Eyes: Conjunctiva clear., Pupils equal and round.   HENT:ENT without erythema or injection, mucous membranes moist.  Lungs: clear to auscultation bilaterally.   Cardiovascular:    Heart regular rate and rhythm  Abdomen: hyperactive BS, soft, no palpable masses, mild LLQ tenderness. No significant CVA tenderness noted.   Skin: Skin warm and dry    Urine dipstick shows positive for large RBCs, positive for small leukocytes and positive for ketones.        ASSESSMENT:    ICD-10-CM    1. Acute cystitis with hematuria N30.01 URINE CULTURE     cephalexin (KEFLEX) 500 mg Oral Capsule   2. Dysuria R30.0 POCT URINE DIPSTICK       PLAN: Keflex BID x 7  Days as per orders - Urine Sent for culture, also push water intake, may use Pyridium OTC prn. Pt plans to f/u with Urology and her OB/GYN in the near future. Pt verbalized understanding and agrees with the plan of care. Call or return to clinic prn if these symptoms worsen or fail to improve as anticipated.

## 2018-03-01 NOTE — Nursing Note (Signed)
03/01/18 1000   Urine   QC Urine yes   Time collected 1021   Glucose Negative   Bilirubin Negative   Ketones Small (15 mg/dl)   Urine Specific Gravity 1.020   Blood (urine) Large (3+)   pH 6.0   Protein Negative   Urobilinogen 0.2mg /dL (Normal)   Nitrite Negative   Leukocytes (!) 1+   Initials hs

## 2018-03-01 NOTE — Nursing Note (Signed)
BP 133/82   Pulse 82   Temp 36.7 C (98.1 F) (Oral)   Ht 1.727 m (5\' 8" )   Wt 74.8 kg (165 lb)   SpO2 98%   BMI 25.09 kg/m     [facility] [place], [place]  03/01/2018, 10:00

## 2018-03-02 NOTE — Progress Notes (Signed)
Preliminary result will await final results to inform patient, sensitivity in progress.  Patient is on cephalexin .

## 2018-03-03 ENCOUNTER — Encounter (INDEPENDENT_AMBULATORY_CARE_PROVIDER_SITE_OTHER): Payer: Self-pay

## 2018-03-03 LAB — URINE CULTURE: URINE CULTURE: 100000 — AB

## 2018-03-03 NOTE — Progress Notes (Signed)
Your culture is growing a bacteria that is sensitive to the antibiotic you are taking.  No change in management.  If you are not improving follow-up with your PCP.    Doran Clay, MD  03/03/2018, 19:09

## 2018-03-03 NOTE — Nursing Note (Signed)
Left a courtesy call back message for the patient to return my call should they have concerns regarding their appt at Urgent Care.  ALP

## 2018-03-05 ENCOUNTER — Telehealth (INDEPENDENT_AMBULATORY_CARE_PROVIDER_SITE_OTHER): Payer: Self-pay | Admitting: Family Medicine

## 2018-03-05 NOTE — Telephone Encounter (Signed)
Spoke with patient regarding urine culture results. Patient stated understanding of results. Carloyn Jaeger, RN  03/05/2018, 13:37

## 2018-03-12 ENCOUNTER — Telehealth (INDEPENDENT_AMBULATORY_CARE_PROVIDER_SITE_OTHER): Payer: Self-pay

## 2018-03-12 NOTE — Telephone Encounter (Signed)
Please sign and fill out Provider reconsideration form on your desk.  Sherin Quarry, MA

## 2018-03-13 ENCOUNTER — Ambulatory Visit (HOSPITAL_BASED_OUTPATIENT_CLINIC_OR_DEPARTMENT_OTHER): Payer: Medicare PPO | Admitting: Anesthesiology

## 2018-03-13 ENCOUNTER — Ambulatory Visit
Admission: RE | Admit: 2018-03-13 | Discharge: 2018-03-13 | Disposition: A | Discharge status: Home / Self Care | Payer: Medicare PPO | Source: Ambulatory Visit | Attending: Gastroenterology | Admitting: Gastroenterology

## 2018-03-13 ENCOUNTER — Encounter (HOSPITAL_BASED_OUTPATIENT_CLINIC_OR_DEPARTMENT_OTHER): Payer: Self-pay

## 2018-03-13 ENCOUNTER — Encounter (HOSPITAL_BASED_OUTPATIENT_CLINIC_OR_DEPARTMENT_OTHER)
Admission: RE | Disposition: A | Discharge status: Home / Self Care | Payer: Self-pay | Source: Ambulatory Visit | Attending: Gastroenterology

## 2018-03-13 DIAGNOSIS — K648 Other hemorrhoids: Secondary | ICD-10-CM | POA: Insufficient documentation

## 2018-03-13 DIAGNOSIS — K219 Gastro-esophageal reflux disease without esophagitis: Secondary | ICD-10-CM | POA: Insufficient documentation

## 2018-03-13 DIAGNOSIS — R197 Diarrhea, unspecified: Secondary | ICD-10-CM | POA: Insufficient documentation

## 2018-03-13 DIAGNOSIS — J40 Bronchitis, not specified as acute or chronic: Secondary | ICD-10-CM | POA: Insufficient documentation

## 2018-03-13 HISTORY — PX: COLONOSCOPY: SHX174

## 2018-03-13 HISTORY — DX: Diarrhea, unspecified: R19.7

## 2018-03-13 SURGERY — DONT USE, USE 1094-COLONOSCOPY, DIAGNOSTIC (SCREENING)
Anesthesia: Anesthesia MAC / Sedation | Site: Anus | Wound class: Clean Contaminated

## 2018-03-13 MED ORDER — ONDANSETRON HCL 4 MG/2ML IJ SOLN
4.0000 mg | INTRAMUSCULAR | Status: DC | PRN
Start: 2018-03-13 — End: 2018-03-13

## 2018-03-13 MED ORDER — SODIUM CHLORIDE 0.9 % IV SOLN
INTRAVENOUS | Status: DC
Start: 2018-03-13 — End: 2018-03-13

## 2018-03-13 MED ORDER — ONDANSETRON 4 MG PO TBDP
4.0000 mg | ORAL_TABLET | ORAL | Status: DC | PRN
Start: 2018-03-13 — End: 2018-03-13

## 2018-03-13 MED ORDER — PROPOFOL 200 MG/20ML IV EMUL
INTRAVENOUS | Status: AC
Start: 2018-03-13 — End: ?
  Filled 2018-03-13: qty 20

## 2018-03-13 MED ORDER — DEXTROSE 10 % IV BOLUS
125.00 mL | Freq: Once | INTRAVENOUS | Status: DC
Start: 2018-03-13 — End: 2018-03-13

## 2018-03-13 MED ORDER — PROPOFOL 200 MG/20ML IV EMUL
INTRAVENOUS | Status: DC | PRN
Start: 2018-03-13 — End: 2018-03-13
  Administered 2018-03-13: 20 mg via INTRAVENOUS
  Administered 2018-03-13: 80 mg via INTRAVENOUS
  Administered 2018-03-13 (×5): 50 mg via INTRAVENOUS

## 2018-03-13 MED ORDER — LIDOCAINE HCL (PF) 2 % IJ SOLN
INTRAMUSCULAR | Status: DC | PRN
Start: 2018-03-13 — End: 2018-03-13
  Administered 2018-03-13: 2 mL via INTRAVENOUS

## 2018-03-13 SURGICAL SUPPLY — 31 items
BRUSH CLEANING COMBINATION (Supply) ×1
BRUSH HEDGEHOG DUAL END (Supply) ×1 IMPLANT
CATH GLD PROBE BICAP 7FX210CM (Supply) IMPLANT
CATH GLD PROBE BICAP 7FX300CM (Supply) IMPLANT
CATH GOLD PROBE 10FR (Supply) IMPLANT
CATH GOLD PROBE INJECTION 10F (Supply) IMPLANT
CLEANER ENZYMATIC BEDSIDE (Supply) ×2 IMPLANT
CLIP RESOLUTION 360 (Supply) IMPLANT
CONNECTOR QUICK PORT (Supply) ×2 IMPLANT
CRE PYLORIC COL 10-12 DIL 5847 (Supply) IMPLANT
CRE PYLORIC COL 12-15 DIL 5848 (Supply) IMPLANT
CRE PYLORIC COL 15-18 DIL 5849 (Supply) IMPLANT
CRE PYLORIC COL 8-10 DIL 5846 (Supply) IMPLANT
DEVICE STERIFLATE INFLATION (Supply) IMPLANT
DIL CRE 18-20 PYL CLN 5850 (Supply) IMPLANT
ENDOCUFF VISION LG GREEN 11.2 (Supply) IMPLANT
FORCEP BIOPSY HOT RADIAL JAW 4 (Supply) IMPLANT
FORCEP BIOPSY RAD JAW 1333-40 (Supply) ×2 IMPLANT
FORCEP RAD JAW 3 PED W/NDL (Supply) IMPLANT
FORCEP RADIAL JAW JUMBO 240CM (Supply) IMPLANT
MARKER ENDOSCOPIC SPOT (Supply) IMPLANT
NDL INTERJECT SCLERO 25G (Supply) IMPLANT
PAD CLINCH ENDO TRASPORT (Supply) ×2 IMPLANT
PROBE SIDE FIRE (Supply) IMPLANT
PROBE STRAIGHT FIRE APC (Supply) IMPLANT
SNARE ENDO CAP2 RND 10MM LOOP (Supply) IMPLANT
SNARE LARGE CAPTIV OVAL 6131 (Supply) IMPLANT
SNARE ROTATE SM OVAL MED STFF (Supply) IMPLANT
SNARE SMALL CAPTIV 6230 (Supply) IMPLANT
VALVE DISP CLEANING BIOGUARD (Supply) ×2 IMPLANT
VALVE OLYMPUS DISP A/W/S/ BIO (Supply) ×2 IMPLANT

## 2018-03-13 NOTE — Anesthesia Preprocedure Evaluation (Signed)
Anesthesia Evaluation    AIRWAY    Mallampati: II    TM distance: >3 FB  Neck ROM: full  Mouth Opening:full   CARDIOVASCULAR    cardiovascular exam normal, regular and normal       DENTAL    no notable dental hx     PULMONARY    pulmonary exam normal and clear to auscultation     OTHER FINDINGS                    PSS Anesthesia Comments: gerd bronchitis        Anesthesia Plan    ASA 2     MAC               (Risks discussed including but not limited to oral trauma and aspiration pneumonia.  Questions answered.  Pt/guardian understands and agrees to proceed)      Detailed anesthesia plan: MAC                  pertinent labs reviewed             Signed by: Darrell Jewel 03/13/18 1:59 PM

## 2018-03-13 NOTE — Discharge Instr - AVS First Page (Signed)
Endoscopy Discharge Instructions    After you leave the hospital:  1. Due to the effects of the sedatives, you may feel tired for the remainder of today.   2. We recommend you go home after discharge  3. It is advisable to have supervision or access to seek help for a few hours after discharge  4. Do not drive, operate machinery, or sign important documents until tomorrow.  5. Please rest and drink extra fluids today.   6.  Avoid alcohol today.  7. Start with light easily tolerated foods advance to a regular diet as tolerated.  8. Resume your normal activities / work tomorrow    When to seek help -     Nausea / Vomiting: - try small amounts of bland foods like crackers or toast, & liquids such as soda, electrolyte replacement drinks or ice chips. Call for:  . New onset or ongoing nausea and vomiting   . The symptoms worsen - cold skin, confusion, blood or unexplainable black appearing vomit  Bleeding:  . Passing of blood or clots  or a change in stool consistency and or black in color  . Vomiting or spitting up blood  Infection:  . Redness or swelling at the IV site that continues to worsen. Initial warm compress may help.  . Fever  Pain / other:  . New onset of pain that does not subside over time or prevents you from normal activity or eating  . Shortness of breath or breathing trouble  . Weakness, feeling faint, passing out  . Swollen or distended abdomen    During business hours call your physician's office.   After-hours call [facility] [phone] and a physician will be contacted

## 2018-03-13 NOTE — Anesthesia Postprocedure Evaluation (Signed)
Anesthesia Post Evaluation    Patient: Nicole Sutton    Procedure(s):  COLONOSCOPY    Anesthesia type: MAC    Last Vitals:BP: 137/83   Heart Rate: 78       Resp Rate: (!) 24   SpO2: 100%   Anesthesia Post Evaluation:     Patient Evaluated: bedside  Patient Participation: complete - patient participated  Level of Consciousness: awake and alert  Pain Score: 0  Pain Management: adequate    Airway Patency: patent    Anesthetic complications: No      PONV Status: none    Cardiovascular status: acceptable  Respiratory status: acceptable  Hydration status: acceptable        Signed by: Darrell Jewel, 03/13/2018 3:02 PM

## 2018-03-13 NOTE — Transfer of Care (Signed)
Anesthesia Transfer of Care Note    Patient: Nicole Sutton    Last vitals:   Vitals:    03/13/18 1458   BP: 137/83   Pulse: 78   Resp: (!) 24   SpO2: 100%       Oxygen: Room Air     Mental Status:sedated    Airway: Natural    Cardiovascular Status:  stable

## 2018-03-14 ENCOUNTER — Encounter (HOSPITAL_BASED_OUTPATIENT_CLINIC_OR_DEPARTMENT_OTHER): Payer: Self-pay | Admitting: Gastroenterology

## 2018-03-15 NOTE — Telephone Encounter (Signed)
Is there another medication you would like to try pt on. [name] has been denied from the insurance.  Sherin Quarry, MA

## 2018-03-17 ENCOUNTER — Encounter (INDEPENDENT_AMBULATORY_CARE_PROVIDER_SITE_OTHER): Payer: Self-pay

## 2018-03-17 ENCOUNTER — Ambulatory Visit (INDEPENDENT_AMBULATORY_CARE_PROVIDER_SITE_OTHER): Payer: Medicare Other

## 2018-03-17 ENCOUNTER — Other Ambulatory Visit: Payer: Medicare Other

## 2018-03-17 VITALS — BP 151/87 | HR 72 | Temp 97.6°F | Resp 12 | Ht 68.0 in | Wt 168.2 lb

## 2018-03-17 DIAGNOSIS — N3001 Acute cystitis with hematuria: Secondary | ICD-10-CM | POA: Insufficient documentation

## 2018-03-17 DIAGNOSIS — R350 Frequency of micturition: Secondary | ICD-10-CM

## 2018-03-17 MED ORDER — PHENAZOPYRIDINE 100 MG TABLET
100.00 mg | ORAL_TABLET | Freq: Three times a day (TID) | ORAL | 0 refills | Status: DC | PRN
Start: 2018-03-17 — End: 2019-10-23

## 2018-03-17 MED ORDER — CEPHALEXIN 500 MG CAPSULE
500.00 mg | ORAL_CAPSULE | Freq: Two times a day (BID) | ORAL | 0 refills | Status: AC
Start: 2018-03-17 — End: 2018-03-24

## 2018-03-17 NOTE — Nursing Note (Signed)
03/17/18 1500   Urine test  (Siemens Multistix 10 SG)   Time collected 1528   Color Yellow   Clarity Cloudy   Glucose Negative   Bilirubin Negative   Ketones Trace (5 mg/dl)   Urine Specific Gravity 1.010   Blood (urine) Large (3+)   pH 6.0   Protein Negative   Urobilinogen 0.2mg /dL (Normal)   Nitrite Negative   Leukocytes (!) 2+   Initials rrc

## 2018-03-17 NOTE — Progress Notes (Signed)
[place] [address] [place]  [place] [address]  [phone]        ID: [name] , 67 y.o., female  DOB: January 24, 1951   Date of Service: 03/17/2018       Chief complaint:   Uti's        SUBJECTIVE:    [name] is a 67 y.o. female who presents to the Urgent Care on 03/17/2018 complaining of cramping abdominal pain and blood on the toilet paper after urinating along with chills when urinating.  She is concerned she might have a urinary tract infection.  She states that all these symptoms started yesterday.  The patient states that she has not usually gotten UTIs except over the past couple of months and that she recently had a urinary tract infection was treated with Keflex back on September 27th.  She states her symptoms resolved until her current symptoms started yesterday.  She states she is going to follow up with a urologist down at [facility] for further evaluation, management and workup for her recent UTIs.  She denies any fevers but does complain of some chills, fatigue and generalized malaise.  She does complain of some mild lower back pain with her symptoms.  She denies any vomiting, diarrhea or other symptoms at this time.  She denies any allergies to antibiotics.      Review of Systems:  Constitutional: Negative for fevers, chills, fatigue and generalized malaise.  Respiratory: Negative for difficulty breathing/SOB  Cardiovascular: Negative for chest pain or chest pressure/discomfort   Gastrointestinal: Negative for vomiting, diarrhea, constipation and abdominal pain  Genitourinary:  See HPI.  GYN: No LMP recorded. Patient is postmenopausal.   Integument/breast: negative for rash  Musculoskeletal: Negative for myalgias and arthralgias    Medication list:  Current Outpatient Medications   Medication Sig   . calcium-vits D3-C-K2-minerals 166.75 mg- 166.75 unit Oral Capsule calcium   . coenzyme Q10 300 mg Oral Capsule Take 300 mg by  mouth   . diclofenac sodium (VOLTAREN) 50 mg Oral Tablet, Delayed Release (E.C.)    . EPINEPHrine 0.3 mg/0.3 mL Injection Auto-Injector 0.3 mL (0.3 mg total) by Intramuscular route Once, as needed for up to 1 dose   . fluticasone propionate (FLONASE) 50 mcg/actuation Nasal Spray, Suspension 1 Spray by Each Nostril route Once a day   . fluticasone propionate (FLONASE) 50 mcg/actuation Nasal Spray, Suspension 1 Spray by Each Nostril route Once a day   . galcanezumab-gnlm (EMGALITY SYRINGE SUBQ) by Subcutaneous route   . Glucosamine HCl 1,500 mg Oral Tablet Take by mouth   . lidocaine (XYLOCAINE) 5 % Ointment Apply topically   . Magnesium 30 mg Oral Tablet magnesium   . MELOXICAM ORAL Take by mouth   . omega-3s/dha/epa/fish oil/D3 (VITAMIN-D + OMEGA-3 ORAL) Vitamin D   . phenazopyridine (PYRIDIUM) 200 mg Oral Tablet Take 1 Tab (200 mg total) by mouth Three times a day as needed for Pain   . rifAXIMin (XIFAXAN) 550 mg Oral Tablet Take 1 Tab (550 mg total) by mouth Twice daily   . Zolmitriptan (ZOMIG) 5 mg Oral Tablet TAKE AT ONSET OF HEADACHE, MAY REPEAT IN 2 HOURS IF NEEDED. MAX OF 2 PER DAY MAX OF 3 DAYS PER WEEK       Allergy list:  Allergies   Allergen Reactions   . Percocet [Oxycodone-Acetaminophen] Nausea/ Vomiting   . Polyethylene Glycol Hives/ Urticaria   . Polyethylene Glycol 3350 Hives/  Urticaria   . Beesting [Hymenoptera Allergenic Extract]    . Procaine  Other Adverse Reaction (Add comment)   . Venom-Wasp        Past medical history:  Past Medical History:   Diagnosis Date   . Breast lump 2005    Resolved prior to biopsy   . Detached retina    . Esophageal reflux    . Hx of migraines    . Iron deficiency anemia    . LLQ abdominal pain    . Migraine    . Osteoporosis    . Pancytopenia    . Prolapsed uterus            Past surgical history  Past Surgical History:   Procedure Laterality Date   . HX BREAST BIOPSY Bilateral     benign    . HX CESAREAN SECTION  1993   . HX COLONOSCOPY     . HX ENDOSCOPIC SINUS  SURGERY     . HX TONSILLECTOMY  1955           Social history:  Social History     Socioeconomic History   . Marital status: Married     Spouse name: Not on file   . Number of children: Not on file   . Years of education: Not on file   . Highest education level: Not on file   Occupational History   . 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 Financial resource strain: Not on file   . Food insecurity:     Worry: Not on file     Inability: Not on file   . Transportation needs:     Medical: Not on file     Non-medical: Not on file   Tobacco Use   . Smoking status: Never Smoker   . Smokeless tobacco: Never Used   Substance and Sexual Activity   . Alcohol use: No   . Drug use: No   . Sexual activity: Yes     Partners: Male   Lifestyle   . Physical activity:     Days per week: Not on file     Minutes per session: Not on file   . Stress: Not on file   Relationships   . Social connections:     Talks on phone: Not on file     Gets together: Not on file     Attends religious service: Not on file     Active member of club or organization: Not on file     Attends meetings of clubs or organizations: Not on file     Relationship status: Not on file   . Intimate partner violence:     Fear of current or ex partner: Not on file     Emotionally abused: Not on file     Physically abused: Not on file     Forced sexual activity: Not on file   Other Topics Concern   . Abuse/Domestic Violence Not Asked   . Breast Self Exam Not Asked   . Caffeine Concern Not Asked   . Calcium intake adequate Not Asked   . Computer Use Not Asked   . Drives Not Asked   . Exercise Concern Not Asked   . Helmet Use Not Asked   . Seat Belt Not Asked   . Special Diet Not Asked   . Sunscreen  used Not Asked   . Uses Cane Not Asked   . Uses walker Not Asked   . Uses wheelchair Not Asked   . Right hand dominant Not Asked   . Left hand dominant Not Asked   . Ambidextrous Not Asked   . Shift Work Not Asked   .  Unusual Sleep-Wake Schedule Not Asked   Social History Narrative   . Not on file       Family history:  Family Medical History:     Problem Relation (Age of Onset)    Asthma Mother, Sister, Brother    Colon Cancer Mother, Father    Congestive Heart Failure Mother    Diabetes Father    No Known Problems Daughter, Maternal Grandmother, Maternal Grandfather, Paternal Grandmother, Paternal Grandfather, Son, Maternal Aunt, Maternal Uncle, Paternal Aunt, Paternal Uncle, Other                OBJECTIVE:    BP (!) 151/87   Pulse 72   Temp 36.4 C (97.6 F) (Oral)   Resp 12   Ht 1.727 m (5\' 8" )   Wt 76.3 kg (168 lb 3.2 oz)   SpO2 97%   BMI 25.57 kg/m      (vitals obtained by nursing staff and reviewed by myself)    General: Patient appears in good health and stated age. Patient is in no respiratory distress and appears comfortable.  Eyes: Conjunctiva clear. Pupils equal and round. Sclera non-icteric.   HENT:   Mucous membranes moist.   Neck:  Neck is supple with no meningeal signs.  Lungs:  Thoracic excursion is normal without accessory muscle use.  Cardiovascular:  Pulse regular rate and rhythm.  Musculoskeletal:  Normal muscle mass with no gross deformity.  The patient does not have left CVA tenderness.  The patient does not have right CVA tenderness.    Skin: Skin color, texture, turgor normal. No rashes or lesions  Psychiatric: AOx3.  Patient with normal affect, normal speech pattern and normal thought content.  Patient with apparent normal judgment.      ASSESSMENT:    Encounter Diagnoses   Name Primary?   . Acute cystitis with hematuria Yes   . Frequent urination        [name] is a 67 y.o. female who presented to the Urgent Care on 03/17/2018 for acute cystitis.    PLAN:  Orders Placed This Encounter   . POCT URINE DIPSTICK          Acute cystitis:  Patient with signs and symptoms of acute cystitis.  POCT dip urine in the urgent care was positive for trace ketones, large amount of blood and  leukocytes.  Urine culture was sent for further analysis.  I am going to treat the patient with Keflex twice daily for 7 days.  C diff precautions given.  I have also prescribed the patient Pyridium to help with bladder spasm.  I educated the patient on maintaining her hydration status and flushing her kidneys and bladder with oral clear fluid intake and returning to the urgent care going to the emergency department if her symptoms should worsen or change in any way that is at all concerning despite antibiotic therapy.  She is to follow up with her primary care provider in the next 1-2 weeks for further evaluation, management and monitoring.  The patient states that she understands and will follow through with this plan.   Follow up:   Patient to follow up in  the urgent care, go to the emergency department or follow up with their primary care physician if their symptoms worsen or fail to improve over the next 7-14 days.      All questions answered.  Patient understands and agrees with plan.    Portions of this note may be dictated using voice recognition software or a dictation service. Variances in spelling and vocabulary are possible and unintentional. Not all errors are caught/corrected. Please notify the [facility] if any discrepancies are noted or if the meaning of any statement is not clear.     Martin Majestic, MD

## 2018-03-17 NOTE — Patient Instructions (Addendum)
[address] [place]  [address]  [place] [address]  Phone: [phone]  Fax: [phone]           Open Daily 8:00am - 8:00pm, except Sundays 12pm-8pm         ~ Closed Thanksgiving and Christmas Day     Attending Caregiver: Martin Majestic, MD    Today's orders:   Orders Placed This Encounter   . URINE CULTURE   . POCT URINE DIPSTICK   . phenazopyridine (PYRIDIUM) 100 mg Oral Tablet   . cephalexin (KEFLEX) 500 mg Oral Capsule     Please return to the urgent care go to the emergency department if your symptoms should worsen change in any way that is at all concerning.  Please follow-up with your primary care provider in the next 1-2 weeks for further evaluation, management and monitoring.      Clostridium difficile as a bacteria that is in the intestines that can cause severe diarrhea after being on an antibiotic.  In order to try and help prevent from happening please eat yogurt with live or active cultures (Greek Yogurt) along with taking a probiotic on a daily basis for at least 3 months after you have completed the antibiotic treatment.      Please maintain your hydration status by drinking clear fluids.  If you are having difficulty sipping/drinking regular water you can mix half water and half ginger ale with a pinch of salt added for every 8 oz of fluid. Simply sip this mixture every 15-30 minutes in order to try and stay hydrated.  You can also use Sprite instead of ginger ale or clear Gatorade instead of Sprite or ginger ale.         Prescription(s) E-Rx to:  [place], [address].    ________________________________________________________________________  Short Term Disability and [facility] does NOT provide assistance with any disability applications.  If you feel your medical condition requires you to be on disability, you will need to follow up with  Your primary care physician or a specialist.  We  apologize for any inconvenience.    For Medication Prescribed by [facility]:  As an Urgent Care facility, our clinic does NOT offer prescription refills over the telephone.    If you need more of the medication one of our medical providers prescribed, you will  Either need to be re-evaluated by [place] or see your primary care physician.    ________________________________________________________________________      It is very important that we have a phone number that is the single best way to contact you in the event that we become aware of important clinical information or concerns after your discharge.  If the phone number you provided at registration is NOT this number you should inform staff and registration prior to leaving.      Your treatment and evaluation today was focused on identifying and treating potentially emergent conditions based on your presenting signs, symptoms, and history.  The resulting initial clinical impression and treatment plan is not intended to be definitive or a substitute for a full physical examination and evaluation by your primary care provider.  If your symptoms persist, worsen, or you develop any new or concerning symptoms, you need to be evaluated.      If you received x-rays during your visit, be aware that the final and formal interpretation of those films by  a radiologist may occur after your discharge.  If there is a significant discrepancy identified after your discharge, we will contact you at the telephone number provided at registration.      If you received a pelvic exam, you may have cultures pending for sexually transmitted diseases.  Positive cultures are reported to the [place] Department of Health as required by state law.  You should be contacted if you cultures are positive.  We will not contact you if they are negative.  You did NOT receive a PAP smear (the screening test for cervical).  This specific test for women is best performed by your gynecologist or  primary care provider when indicated.      If you are over 65 year old, we cannot discuss your personal health information with a parent, spouse, family member, or anyone else without your express consent.  This does not include those who have legitimate access to your records and information to assist in your care under the provisions of HIPAA ([place] and [place]) law, or those to whom you have previously given express written consent to do so, such a legal guardian or Power of [place].      You may have received medication that may cause you to feel drowsy and/or light headed for several hours.  You may even experience some amnesia of your stay.  You should avoid operating a motor vehicle or performing any activity requiring complete alertness or coordination until you feel fully awake (approximately 24-48 hours).  Avoid alcoholic beverages.  You may also have a dry mouth for several hours.  This is a normal side effect and will disappear as the effects of the medication wear off.      Instructions discussed with patient upon discharge by clinical staff with all questions answered.  Please call [facility] [phone] if any further questions.  Go immediately to the emergency department if any concern or worsening symptoms.    Martin Majestic, MD 03/17/2018, 15:37        Bladder Infection,Female (Adult)    Urine is normally doesn't have any bacteria in it. But bacteria can get into the urinary tract from the skin around the rectum. Or they can travel in the blood from elsewhere in the body. Once they are in your urinary tract, they can cause infection in the urethra (urethritis), the bladder (cystitis), or the kidneys (pyelonephritis).  The most common place for an infection is in the bladder. This is called a bladder infection. This is one of the most common infections in women. Most bladder infections are easily treated. They are not serious unless the infection spreads to  the kidney.  The phrases "bladder infection," "UTI," and "cystitis" are often used to describe the same thing. But they are not always the same. Cystitis is an inflammation of the bladder. Themost common cause of cystitis is an infection.  Symptoms  The infection causes inflammation in the urethra and bladder. This causes many of the symptoms. The most common symptoms of a bladder infection are:   Pain or burning when urinating   Having to urinate more often than usual   Urgent need to urinate   Only a small amount of urine comes out   Blood in urine   Abdominal discomfort. This is usually in the lower abdomen above the pubic bone.   Cloudy urine   Strong- or bad-smelling urine   Unable to urinate (urinary retention)   Unable to hold  urine in (urinary incontinence)   Fever   Loss of appetite   Confusion (in older adults)  Causes  Bladder infections are not contagious. You can't get one from someone else, from a toilet seat, or from sharing a bath.  The most common cause of bladder infections is bacteria from the bowels. The bacteria get onto the skin around the opening of the urethra. From there, they can get into the urine and travel up to the bladder, causing inflammation and infection. This usually happens because of:   Wiping improperly after urinating. Always wipe from front to back.   Bowel incontinence   Pregnancy   Procedures such as having a catheter inserted   Older age   Not emptying your bladder. This can allow bacteria a chance to grow in your urine.   Dehydration   Constipation   Sex   Use of a diaphragm for birth control  Treatment  Bladder infections are diagnosed by a urine test and urine culture. They are treated with antibiotics and usuallyclear up quickly without complications. Treatment helps prevent a more serious kidney infection.  Medicines  Medicines can help in the treatment of a bladder infection:   Take antibiotics until they are used up, even if you feel  better. It is important to finish them to make sure the infection has cleared.   You can use acetaminophen or ibuprofen for pain, fever, or discomfort, unless another medicine was prescribed. If you have chronic liver or kidney disease, talk with your healthcareprovider before usingthese medicines. Also talk with your provider if you've ever had a stomach ulcer or gastrointestinal bleeding, or are taking blood-thinner medicines.   If you are givenphenazopydridine to reduce burning with urination, it will cause your urine to become a bright orange color. This can stain clothing.  Care and prevention  These self-care steps can help prevent future infections:   Drink plenty of fluids to prevent dehydration and flush out your bladder. Do thisunless you must restrict fluids for other health reasons, or your healthcare provider told you not to.   Proper cleaning after going to the bathroom is important. Wipe from front to back after using the toilet to prevent the spread of bacteria.   Urinate more often. Don't try to hold urine in for a long time.   Wear loose-fitting clothes and cotton underwear. Avoid tight-fitting pants.   Improve your diet and prevent constipation. Eat more fresh fruit and vegetables, andfiber, and less junk and fatty foods.   Avoid sex until your symptoms are gone.   Avoid caffeine, alcohol, and spicy foods. These can irritate your bladder.   Urinate right after intercourse to flush out your bladder.   If you use birth control pills and have frequent bladder infections, discuss it with your healthcare provider.  Follow-up care  Call your healthcare provider if all symptoms are not gone after 3 days of treatment. This is especially important if you have repeat infections.  If a culture were done, you will be told if your treatment needs to be changed. If directed, you can callto find out the results.  If X-rays were done, you will be told if the results will affect  yourtreatment.  Call 911  Call 911 if any of the following occur:   Trouble breathing   Hard to wake up orconfusion   Fainting or loss of consciousness   Rapid heart rate  When to seek medical advice  Call your healthcare provider right away if any  of these occur:   Fever of 100.47F (38.0C) or higher, or as directed by your healthcare provider   Symptoms are not betterby the third day of treatment   Back or belly (abdominal) pain that gets worse   Repeated vomiting, or unable to keep medicine down   Weakness or dizziness   Vaginal discharge   Pain, redness, or swelling in the outer vaginal area (labia)  Date Last Reviewed: 03/06/2015   2000-2019 [employer]. [address] [place] [address]. All rights reserved. This information is not intended as a substitute for professional medical care. Always follow your healthcare professional's instructions.

## 2018-03-18 NOTE — Progress Notes (Signed)
This is a preliminary urine culture result.    Current Outpatient Medications:  cephalexin (KEFLEX) 500 mg Oral Capsule, Take 1 Cap (500 mg total) by mouth Twice daily for 7 days    This is a preliminary urine culture result and patient is being treated with Keflex for UTI.  Will need to continue following this urine culture and await final sensitivities.  Continue with current plan at this time.    Martin Majestic, MD  03/18/2018, 14:12

## 2018-03-19 ENCOUNTER — Telehealth (INDEPENDENT_AMBULATORY_CARE_PROVIDER_SITE_OTHER): Payer: Self-pay

## 2018-03-19 ENCOUNTER — Telehealth (HOSPITAL_BASED_OUTPATIENT_CLINIC_OR_DEPARTMENT_OTHER): Payer: Self-pay

## 2018-03-19 LAB — URINE CULTURE: URINE CULTURE: 40000 — AB

## 2018-03-19 NOTE — Telephone Encounter (Signed)
Spoke with patient regarding positive urine culture. Patient states understanding.   Elvera Bicker, RN  03/19/2018, 18:29

## 2018-03-19 NOTE — Progress Notes (Signed)
Your culture is growing a bacteria that is sensitive to the antibiotic you are taking.  No change in management.  If you are not improving follow-up with your PCP.    Doran Clay, MD  03/19/2018, 08:32

## 2018-03-19 NOTE — Telephone Encounter (Signed)
Contacted patient to check health status.  Deneise Lever Mask has not improved.   If condition get worse patient is to call PCP, come back to the Urgent Care or go to the ED . Patient stated their understanding.  Elvera Bicker, RN  03/19/2018, 15:15

## 2018-09-10 MED ADMIN — carvediloL 6.25 mg tablet: @ 17:00:00

## 2018-09-11 ENCOUNTER — Other Ambulatory Visit (INDEPENDENT_AMBULATORY_CARE_PROVIDER_SITE_OTHER): Payer: Self-pay | Admitting: Family Medicine

## 2018-09-11 ENCOUNTER — Encounter (INDEPENDENT_AMBULATORY_CARE_PROVIDER_SITE_OTHER): Payer: Self-pay | Admitting: Family Medicine

## 2018-09-11 MED ORDER — CEPHALEXIN 500 MG CAPSULE
500.00 mg | ORAL_CAPSULE | Freq: Two times a day (BID) | ORAL | 0 refills | Status: DC
Start: 2018-09-11 — End: 2019-01-09

## 2018-10-04 ENCOUNTER — Other Ambulatory Visit (INDEPENDENT_AMBULATORY_CARE_PROVIDER_SITE_OTHER): Payer: Self-pay | Admitting: Family Medicine

## 2018-10-04 DIAGNOSIS — M549 Dorsalgia, unspecified: Secondary | ICD-10-CM

## 2018-10-08 ENCOUNTER — Other Ambulatory Visit: Payer: Self-pay

## 2018-10-08 ENCOUNTER — Ambulatory Visit: Payer: Medicare Other | Attending: Family Medicine

## 2018-10-08 DIAGNOSIS — M549 Dorsalgia, unspecified: Secondary | ICD-10-CM | POA: Insufficient documentation

## 2018-10-08 LAB — URINALYSIS WITH MICROSCOPIC REFLEX IF INDICATED BMC/JMC ONLY
BILIRUBIN: NEGATIVE mg/dL
BLOOD: NEGATIVE mg/dL
GLUCOSE: NEGATIVE mg/dL
KETONES: NEGATIVE mg/dL
LEUKOCYTES: NEGATIVE WBCs/uL
NITRITE: NEGATIVE
NITRITE: NEGATIVE
PH: 5.5 (ref <8.0)
PROTEIN: NEGATIVE mg/dL
QRS Duration: 99 ms
SPECIFIC GRAVITY: 1.023 — ABNORMAL HIGH (ref <1.022)
UROBILINOGEN: <2 mg/dL (ref <=2.0)

## 2018-10-09 LAB — URINE CULTURE: URINE CULTURE: NO GROWTH

## 2018-11-07 ENCOUNTER — Other Ambulatory Visit: Payer: Self-pay

## 2018-11-12 ENCOUNTER — Ambulatory Visit
Admission: RE | Admit: 2018-11-12 | Discharge: 2018-11-12 | Disposition: A | Discharge status: Home / Self Care | Payer: Medicare Other | Source: Ambulatory Visit | Attending: Family Medicine | Admitting: Family Medicine

## 2018-11-12 ENCOUNTER — Ambulatory Visit (INDEPENDENT_AMBULATORY_CARE_PROVIDER_SITE_OTHER): Payer: Medicare Other | Admitting: Family Medicine

## 2018-11-12 ENCOUNTER — Other Ambulatory Visit: Payer: Self-pay

## 2018-11-12 ENCOUNTER — Ambulatory Visit: Payer: Medicare Other

## 2018-11-12 ENCOUNTER — Encounter (INDEPENDENT_AMBULATORY_CARE_PROVIDER_SITE_OTHER): Payer: Self-pay | Admitting: Family Medicine

## 2018-11-12 VITALS — BP 138/82 | HR 68 | Temp 98.0°F | Resp 18 | Ht 68.0 in | Wt 170.0 lb

## 2018-11-12 DIAGNOSIS — M549 Dorsalgia, unspecified: Secondary | ICD-10-CM

## 2018-11-12 DIAGNOSIS — R109 Unspecified abdominal pain: Secondary | ICD-10-CM

## 2018-11-12 DIAGNOSIS — M5136 Other intervertebral disc degeneration, lumbar region: Secondary | ICD-10-CM | POA: Insufficient documentation

## 2018-11-12 DIAGNOSIS — M419 Scoliosis, unspecified: Secondary | ICD-10-CM | POA: Insufficient documentation

## 2018-11-12 DIAGNOSIS — M5134 Other intervertebral disc degeneration, thoracic region: Secondary | ICD-10-CM | POA: Insufficient documentation

## 2018-11-12 LAB — POCT URINE DIPSTICK
BILIRUBIN: NEGATIVE
BLOOD: NEGATIVE
GLUCOSE: NEGATIVE
LEUKOCYTES: NEGATIVE
NITRITE: NEGATIVE
PH: 6
PROTEIN: NEGATIVE
SPECIFIC GRAVITY: 1.02
UROBILINOGEN: 0.2

## 2018-11-12 MED ORDER — TRAMADOL 50 MG TABLET
1.00 | ORAL_TABLET | ORAL | 0 refills | Status: DC | PRN
Start: 2018-11-12 — End: 2018-11-27

## 2018-11-12 NOTE — Nursing Note (Signed)
Chief Complaint:   Chief Complaint            Urinary Tract Infection     Back Pain         Functional Health Screen        Vital Signs  BP 138/82   Pulse 68   Temp 36.7 C (98 F) (Oral)   Resp 18   Ht 1.727 m (5\' 8" )   Wt 77.1 kg (170 lb)   BMI 25.85 kg/m       Social History     Tobacco Use   Smoking Status Never Smoker   Smokeless Tobacco Never Used     Allergies  Allergies   Allergen Reactions   . Percocet [Oxycodone-Acetaminophen] Nausea/ Vomiting   . Polyethylene Glycol Hives/ Urticaria   . Polyethylene Glycol 3350 Hives/ Urticaria   . Beesting [Hymenoptera Allergenic Extract]    . Procaine  Other Adverse Reaction (Add comment)   . Venom-Wasp      Medication History  Reviewed for OTC medication and any new medications, provider will review medication history  Care Team  Patient Care Team:  [name], MD as PCP - General ([place])  Britt Bottom, MD (Inactive) ([facility])  Immunizations - last 24 hours     None        Elsie Ra, [place]  11/12/2018, 13:06

## 2018-11-12 NOTE — Nursing Note (Signed)
11/12/18 1300   Required: Location Test Performed At:   [facility] [address] [place], [place] [address]   Urine test  (Siemens Multistix 10 SG)   Time collected 1332   Color Yellow   Clarity Clear   Glucose Negative   Bilirubin Negative   Ketones Small (15 mg/dl)   Urine Specific Gravity 1.020   Blood (urine) Negative   pH 6.0   Protein Negative   Urobilinogen 0.2mg /dL (Normal)   Nitrite Negative   Leukocytes Negative   Performed Status Automated   Bottle Number   (Siemens Multistix 10 SG) 2161   Lot # [id number]   Expiration Date 11/03/19   Initials mlc

## 2018-11-12 NOTE — Progress Notes (Signed)
Outpatient Visit Note (Assment/Plan;Subjective;Objective)     Patient: [name] Provider: [name], MD   Date of Birth: 1950-11-07 Date of Visit 11/12/2018      Assessment: Plan:       ICD-10-CM    1. Back pain, unspecified back location, unspecified back pain laterality, unspecified chronicity  Check xrays for signs of DDD or compression fracture M54.9 XR THORACIC SPINE SERIES     XR LUMBAR SPINE SERIES     traMADoL (ULTRAM) 50 mg Oral Tablet   2. Flank pain  Due to pain starting after pyelonephritis will check CT SCAN for hydronephrosis or renal calculi R10.9 CT RENAL STONE PROTOCOL (ABD/PEL WO IV CONTRAST)       Orders Placed This Encounter   . CT RENAL STONE PROTOCOL (ABD/PEL WO IV CONTRAST)   . XR THORACIC SPINE SERIES   . XR LUMBAR SPINE SERIES   . traMADoL (ULTRAM) 50 mg Oral Tablet     Current Outpatient Medications   Medication Sig   . calcium-vits D3-C-K2-minerals 166.75 mg- 166.75 unit Oral Capsule calcium   . cephalexin (KEFLEX) 500 mg Oral Capsule Take 1 Cap (500 mg total) by mouth Twice daily   . coenzyme Q10 300 mg Oral Capsule Take 300 mg by mouth   . diclofenac sodium (VOLTAREN) 50 mg Oral Tablet, Delayed Release (E.C.)    . EPINEPHrine 0.3 mg/0.3 mL Injection Auto-Injector 0.3 mL (0.3 mg total) by Intramuscular route Once, as needed for up to 1 dose   . fluticasone propionate (FLONASE) 50 mcg/actuation Nasal Spray, Suspension 1 Spray by Each Nostril route Once a day   . fluticasone propionate (FLONASE) 50 mcg/actuation Nasal Spray, Suspension 1 Spray by Each Nostril route Once a day   . galcanezumab-gnlm (EMGALITY SYRINGE SUBQ) by Subcutaneous route   . Glucosamine HCl 1,500 mg Oral Tablet Take by mouth   . lidocaine (XYLOCAINE) 5 % Ointment Apply topically   . Magnesium 30 mg Oral Tablet magnesium   . MELOXICAM ORAL Take by mouth   . omega-3s/dha/epa/fish oil/D3 (VITAMIN-D + OMEGA-3 ORAL) Vitamin D   . phenazopyridine (PYRIDIUM) 100 mg Oral Tablet Take 1 Tab (100 mg total) by mouth  Three times a day as needed for Pain   . rifAXIMin (XIFAXAN) 550 mg Oral Tablet Take 1 Tab (550 mg total) by mouth Twice daily   . traMADoL (ULTRAM) 50 mg Oral Tablet Take 1 Tab (50 mg total) by mouth Every 4 hours as needed for Pain   . Zolmitriptan (ZOMIG) 5 mg Oral Tablet TAKE AT ONSET OF HEADACHE, MAY REPEAT IN 2 HOURS IF NEEDED. MAX OF 2 PER DAY MAX OF 3 DAYS PER WEEK             follow up in prn    [address]214    Subjective:   CC:   Chief Complaint   Patient presents with   . Urinary Tract Infection   . Back Pain     [name] is a 68 y.o. female here for pain    Patient states that she has had mid to low back pain since the end of February after a prolonged urinary tract/kidney infection.  She states she was given 3 antibiotics before her back pain improved.  She denies any recent fever, nausea vomiting or pelvic pain.  She denies any radiation of the pain but states that her back is painful to the touch or with sitting against a chair or when she lies down to sleep.  She also states that urination causes spasms.  She denies any recent change in activities or injury.  She denies any lower extremity numbness or weakness.  Other medical issues include:  Patient Active Problem List   Diagnosis   . Pancytopenia   . Left lower quadrant pain   . Iron deficiency   . Fatigue   . Dyspnea   . 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HCC)     No changes in [facility]    Past Medical History:   Diagnosis Date   . Breast lump 2005    Resolved prior to biopsy   . Detached retina    . Esophageal reflux    . Hx of migraines    . Iron deficiency anemia    . LLQ abdominal pain    . Migraine    . Osteoporosis    . Pancytopenia    . Prolapsed uterus          Past Surgical History:   Procedure Laterality Date   . HX BREAST BIOPSY Bilateral     benign    . HX CESAREAN SECTION  1993   . HX COLONOSCOPY     . HX ENDOSCOPIC SINUS SURGERY     . HX TONSILLECTOMY  1955         Family Medical History:     Problem Relation (Age of Onset)    Asthma Mother,  Sister, Brother    Colon Cancer Mother, Father    Congestive Heart Failure Mother    Diabetes Father    No Known Problems Daughter, Maternal Grandmother, Maternal Grandfather, Paternal 71, Paternal Grandfather, Son, Maternal Aunt, Maternal Uncle, Paternal 54, Paternal Uncle, Other            Social History     Socioeconomic History   . Marital status: Married     Spouse name: Not on file   . Number of children: Not on file   . Years of education: Not on file   . Highest education level: Not on file   Occupational History   . 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 Smoking status: Never Smoker   . Smokeless tobacco: Never Used   Substance and Sexual Activity   . Alcohol use: No   . Drug use: No   . Sexual activity: Yes     Partners: Male   Other Topics Concern     Allergies   Allergen Reactions   . Percocet [Oxycodone-Acetaminophen] Nausea/ Vomiting   . Polyethylene Glycol Hives/ Urticaria   . Polyethylene Glycol 3350 Hives/ Urticaria   . Beesting [Hymenoptera Allergenic Extract]    . Procaine  Other Adverse Reaction (Add comment)   . Venom-Wasp      Medications reviewed  Review of Systems: Other than ROS in the HPI, all other systems were negative.   Outpatient Medications Prior to Visit   Medication Sig Dispense Refill   . calcium-vits D3-C-K2-minerals 166.75 mg- 166.75 unit Oral Capsule calcium     . cephalexin (KEFLEX) 500 mg Oral Capsule Take 1 Cap (500 mg total) by mouth Twice daily 14 Cap 0   . coenzyme Q10 300 mg Oral Capsule Take 300 mg by mouth     . diclofenac sodium (VOLTAREN) 50 mg Oral Tablet, Delayed Release (E.C.)      . EPINEPHrine 0.3 mg/0.3 mL Injection Auto-Injector 0.3 mL (0.3 mg total) by Intramuscular route Once,  as needed for up to 1 dose 2 Each 2   . fluticasone propionate (FLONASE) 50 mcg/actuation Nasal Spray, Suspension 1 Spray by Each Nostril route Once a day 16 g 0   . fluticasone propionate (FLONASE) 50 mcg/actuation  Nasal Spray, Suspension 1 Spray by Each Nostril route Once a day 1 Bottle 5   . galcanezumab-gnlm (EMGALITY SYRINGE SUBQ) by Subcutaneous route     . Glucosamine HCl 1,500 mg Oral Tablet Take by mouth     . lidocaine (XYLOCAINE) 5 % Ointment Apply topically     . Magnesium 30 mg Oral Tablet magnesium     . MELOXICAM ORAL Take by mouth     . omega-3s/dha/epa/fish oil/D3 (VITAMIN-D + OMEGA-3 ORAL) Vitamin D     . phenazopyridine (PYRIDIUM) 100 mg Oral Tablet Take 1 Tab (100 mg total) by mouth Three times a day as needed for Pain 15 Tab 0   . rifAXIMin (XIFAXAN) 550 mg Oral Tablet Take 1 Tab (550 mg total) by mouth Twice daily 20 Tab 0   . Zolmitriptan (ZOMIG) 5 mg Oral Tablet TAKE AT ONSET OF HEADACHE, MAY REPEAT IN 2 HOURS IF NEEDED. MAX OF 2 PER DAY MAX OF 3 DAYS PER WEEK 9 Tab 11     No facility-administered medications prior to visit.      Objective:     BP 138/82   Pulse 68   Temp 36.7 C (98 F) (Oral)   Resp 18   Ht 1.727 m (5\' 8" )   Wt 77.1 kg (170 lb)   BMI 25.85 kg/m      Body mass index is 25.85 kg/m.    No LMP recorded. Patient is postmenopausal.  GEN: NAD  LUNGS: CTA  B, anteriorly and posteriorly  HRT: RRR, neg murmur  ABD: normal bowel sounds, soft, nontender, nondistended, no hepatosplenomegaly  EXT: No edema  SPINE: tender to palpation of lower thoracic/upper lumbar bilateral,no skin lesions, tight paraspinal muscles, -SLR    Labs:   No visits with results within 1 Month(s) from this visit.   Latest known visit with results is:   Appointment on 10/08/2018   Component Date Value Ref Range Status   . COLOR 10/08/2018 Light Yellow  Light Yellow, Straw, Yellow Final   . APPEARANCE 10/08/2018 Clear  Clear Final   . PH 10/08/2018 5.5  <8.0 Final   . LEUKOCYTES 10/08/2018 Negative  Negative WBCs/uL Final   . NITRITE 10/08/2018 Negative  Negative Final   . PROTEIN 10/08/2018 Negative  Negative, 10  mg/dL Final   . GLUCOSE 10/08/2018 Negative  Negative mg/dL Final   . KETONES 10/08/2018 Negative   Negative mg/dL Final   . UROBILINOGEN 10/08/2018 < 2.0  <=2.0 mg/dL Final   . BILIRUBIN 10/08/2018 Negative  Negative mg/dL Final   . BLOOD 10/08/2018 Negative  Negative mg/dL Final   . SPECIFIC GRAVITY 10/08/2018 1.023* <1.022 Final   . URINE CULTURE 10/08/2018 No Growth 18-24 hrs.   Final         [name], MD  [place], [facility]ARE [place]  [place] [address]  Dept: [phone]  Dept Fax: [phone]  Loc: [phone]  [place] Fax: [phone]

## 2018-11-19 IMAGING — CT CT ABD-PELV W/ CM
2 of 5 series · 17 of 46 positions shown, 19 images · IV contrast (Omni 300)
Comparison: None.

CLINICAL DATA: Bloating with left lower quadrant pain

EXAM:
CT ABDOMEN AND PELVIS WITH CONTRAST
TECHNIQUE: Multidetector CT imaging of the abdomen and pelvis was performed
using the standard protocol following bolus administration of
intravenous contrast.
CONTRAST:  100mL HYIQN6-F00 IOPAMIDOL (HYIQN6-F00) INJECTION 61%

[Series 3: a/p w/ 5mm · axial · 0.93mm/px · z∈[+848,+1263]mm · 14 of 95 slices shown, 16 images]
[im 6/95  soft-tissue]
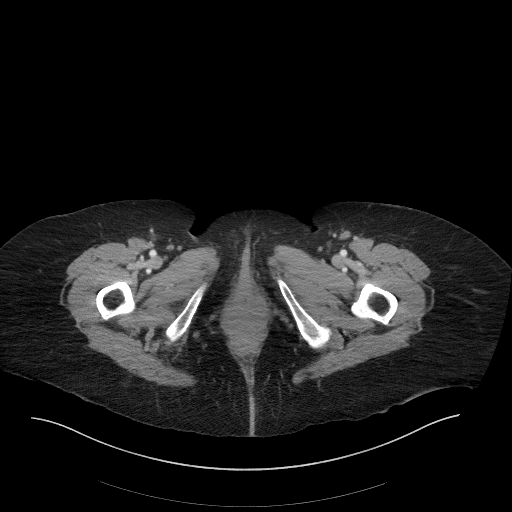
[im 6/95  bone]
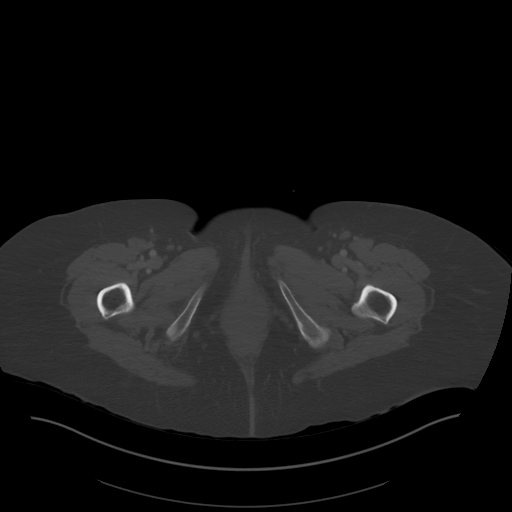
[im 11/95  soft-tissue]
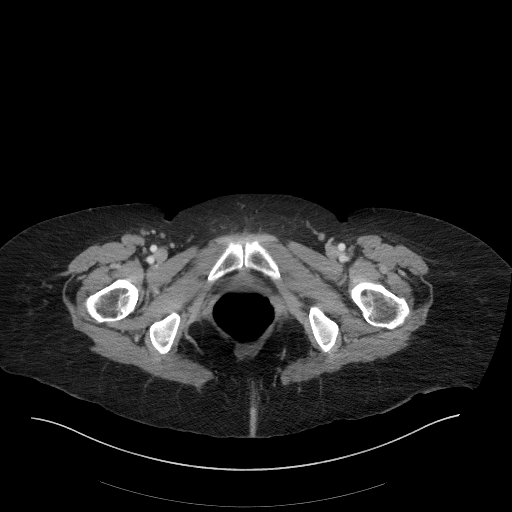
[im 21/95  soft-tissue]
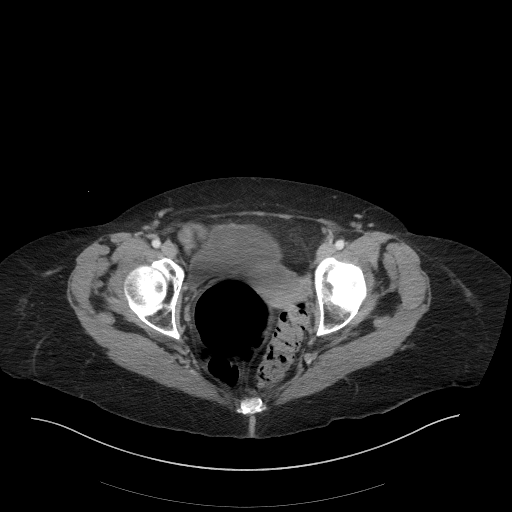
[im 27/95  soft-tissue]
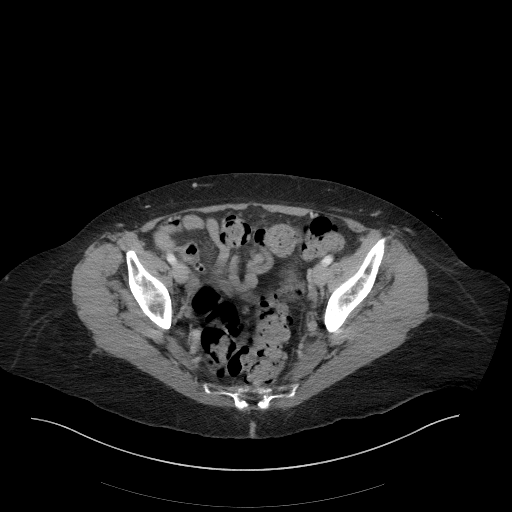
[im 32/95  soft-tissue]
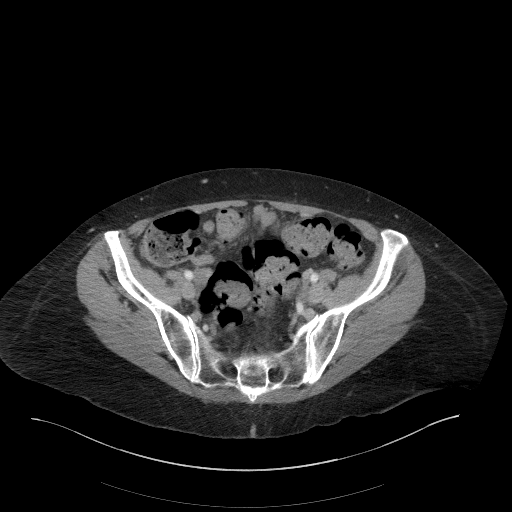
[im 37/95  soft-tissue]
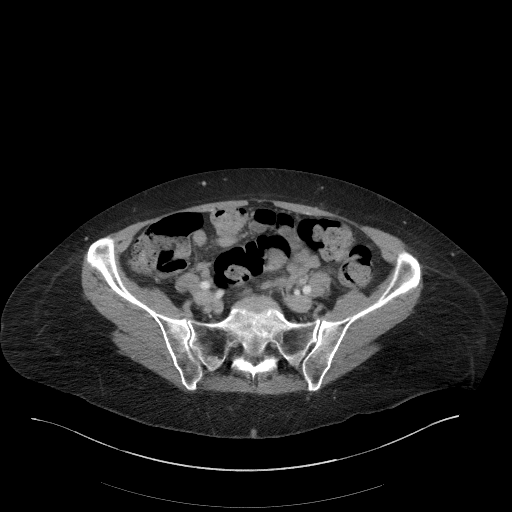
[im 42/95  soft-tissue]
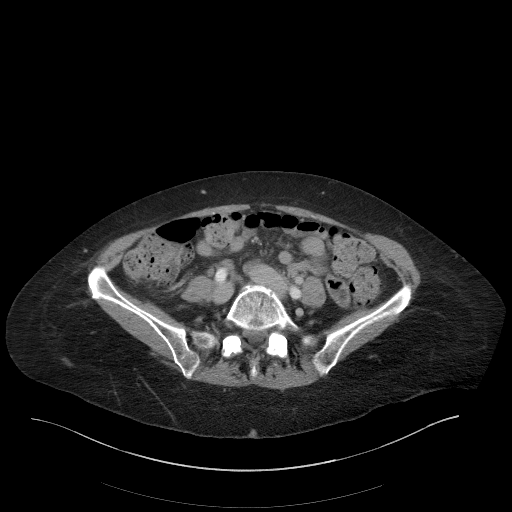
[im 53/95  soft-tissue]
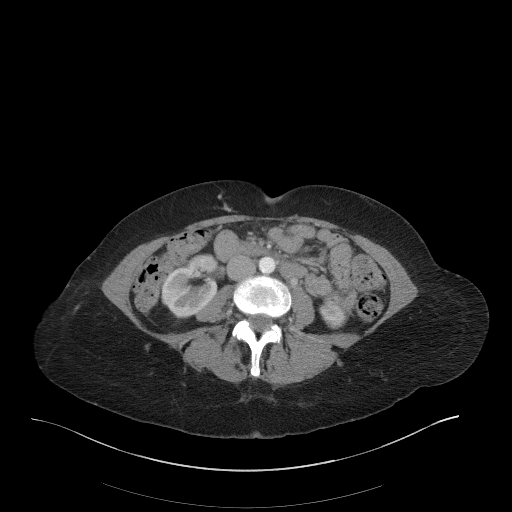
[im 58/95  soft-tissue]
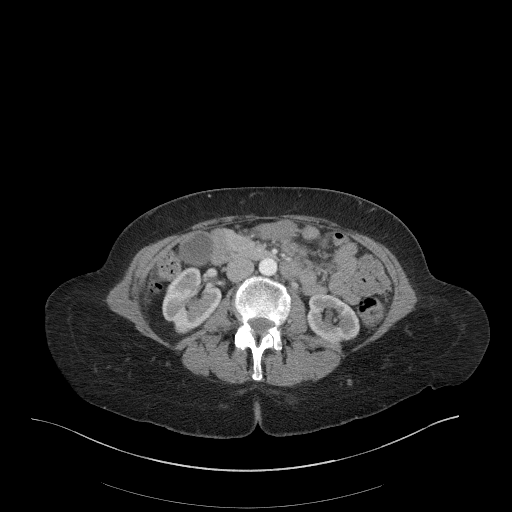
[im 58/95  bone]
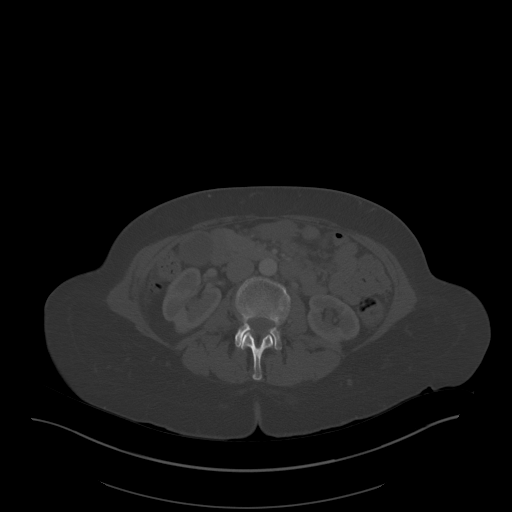
[im 63/95  soft-tissue]
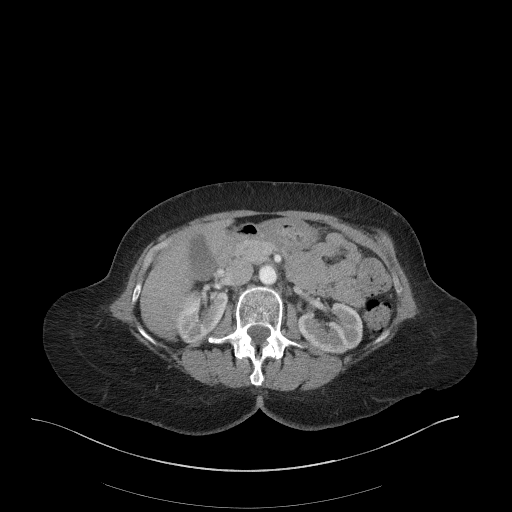
[im 68/95  soft-tissue]
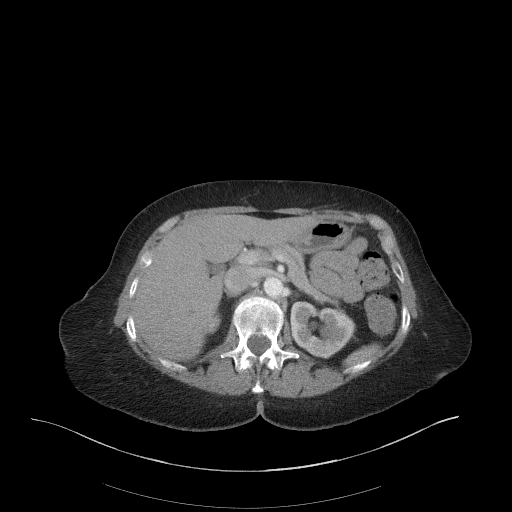
[im 74/95  soft-tissue]
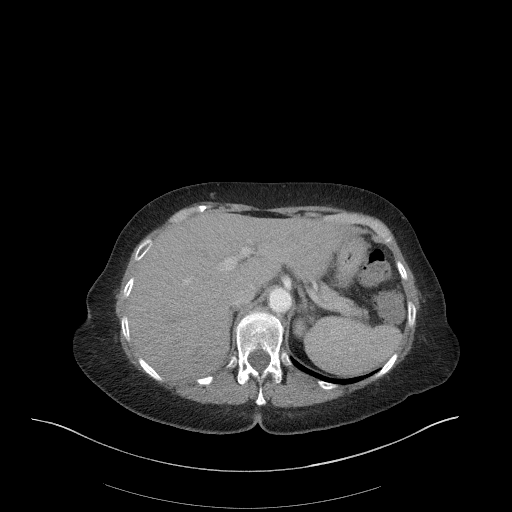
[im 84/95  soft-tissue]
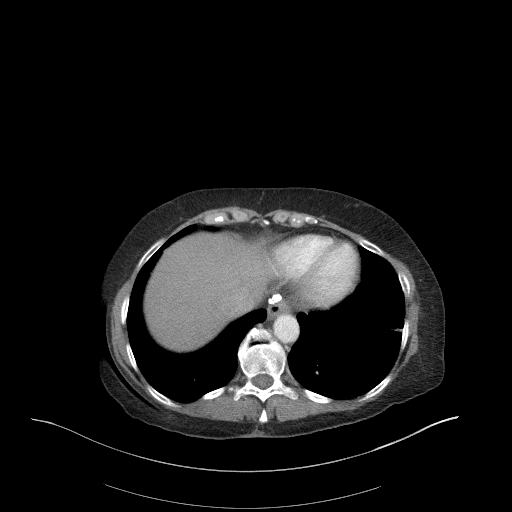
[im 89/95  soft-tissue]
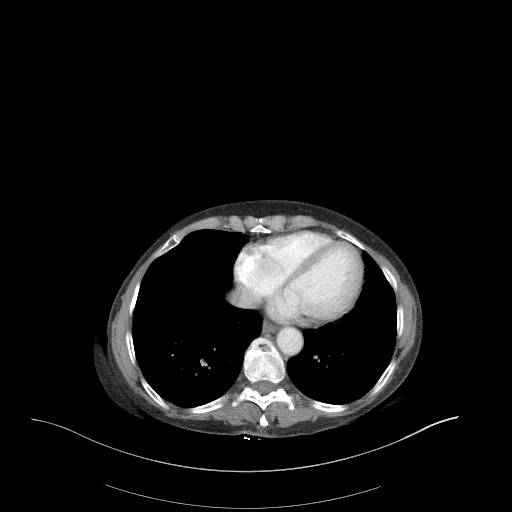

[Series 5: a/p w/ cor · coronal · 0.89mm/px · 3 of 136 slices shown]
[im 46/136  soft-tissue]
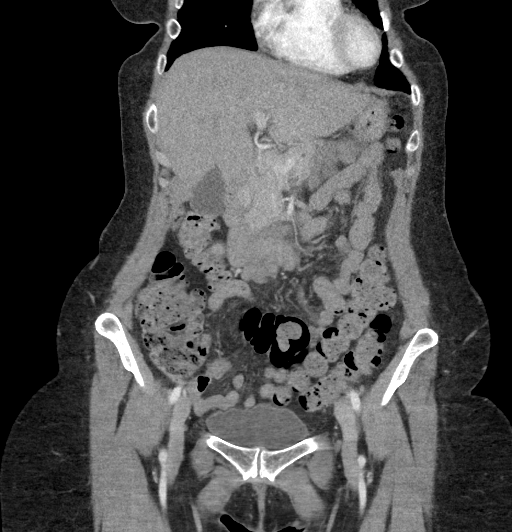
[im 61/136  soft-tissue]
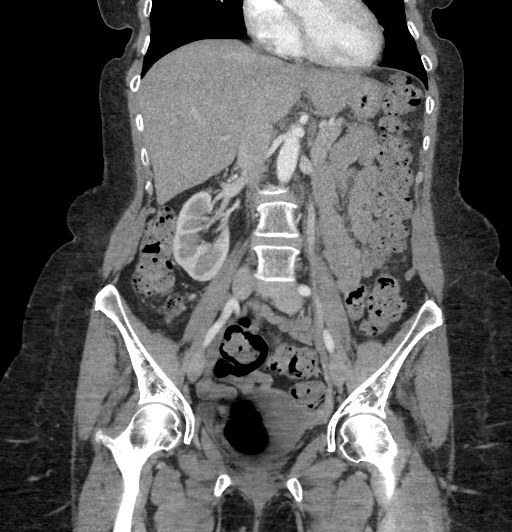
[im 76/136  soft-tissue]
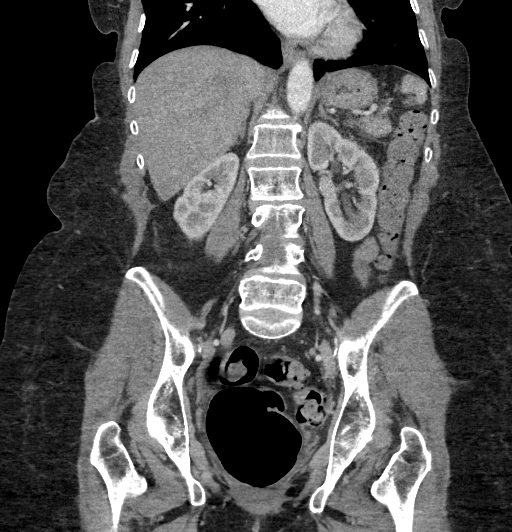

[17 of 46 positions shown; findings below may reference images not displayed]

FINDINGS: Lower chest: Lung bases demonstrate no acute consolidation or
effusion. Calcified granuloma in the left lung base. Focal hyper
lucent lung in the medial right base. Normal heart size is

Hepatobiliary: No focal liver abnormality is seen. No gallstones,
gallbladder wall thickening, or biliary dilatation.

Pancreas: Unremarkable. No pancreatic ductal dilatation or
surrounding inflammatory changes.

Spleen: Normal in size without focal abnormality.

Adrenals/Urinary Tract: Adrenal glands are unremarkable. Kidneys are
normal, without renal calculi, focal lesion, or hydronephrosis.
Bladder is unremarkable.

Stomach/Bowel: Stomach is nonenlarged. No dilated small bowel. No
colon wall thickening. Large amount of stool throughout the colon.

Vascular/Lymphatic: Aortic atherosclerosis. No enlarged abdominal or
pelvic lymph nodes.

Reproductive: Uterus and bilateral adnexa are unremarkable.

Other: No free air or free fluid.

Musculoskeletal: No acute or suspicious bone lesion.
IMPRESSION: 1. No CT evidence for acute intra-abdominal or pelvic pathology
2. Large amount of stool in the colon, query constipation.

## 2018-11-20 ENCOUNTER — Telehealth (INDEPENDENT_AMBULATORY_CARE_PROVIDER_SITE_OTHER): Payer: Self-pay | Admitting: Family Medicine

## 2018-11-20 ENCOUNTER — Other Ambulatory Visit: Payer: Self-pay

## 2018-11-20 ENCOUNTER — Ambulatory Visit (HOSPITAL_BASED_OUTPATIENT_CLINIC_OR_DEPARTMENT_OTHER)
Admission: RE | Admit: 2018-11-20 | Discharge: 2018-11-20 | Disposition: A | Discharge status: Home / Self Care | Payer: Medicare Other | Source: Ambulatory Visit | Attending: Family Medicine | Admitting: Family Medicine

## 2018-11-20 DIAGNOSIS — R109 Unspecified abdominal pain: Secondary | ICD-10-CM | POA: Insufficient documentation

## 2018-11-20 DIAGNOSIS — N200 Calculus of kidney: Secondary | ICD-10-CM | POA: Insufficient documentation

## 2018-11-20 DIAGNOSIS — K5900 Constipation, unspecified: Secondary | ICD-10-CM | POA: Insufficient documentation

## 2018-11-20 DIAGNOSIS — N12 Tubulo-interstitial nephritis, not specified as acute or chronic: Secondary | ICD-10-CM

## 2018-11-20 MED ORDER — LEVOFLOXACIN 750 MG TABLET
750.0000 mg | ORAL_TABLET | Freq: Every day | ORAL | 0 refills | Status: DC
Start: 2018-11-20 — End: 2019-10-23

## 2018-11-20 NOTE — Telephone Encounter (Signed)
Notified patient regarding CT SCAN, will repeat a 5 days course of Levaquin 750mg  since this helped in  the past to relieve the flank pain and refer to urology

## 2018-11-27 ENCOUNTER — Encounter (INDEPENDENT_AMBULATORY_CARE_PROVIDER_SITE_OTHER): Payer: Self-pay | Admitting: Family Medicine

## 2018-11-27 ENCOUNTER — Other Ambulatory Visit: Payer: Self-pay

## 2018-11-27 ENCOUNTER — Ambulatory Visit (INDEPENDENT_AMBULATORY_CARE_PROVIDER_SITE_OTHER): Payer: Medicare Other | Admitting: Family Medicine

## 2018-11-27 VITALS — BP 130/78 | HR 84 | Temp 98.1°F | Resp 18 | Ht 68.0 in | Wt 176.0 lb

## 2018-11-27 DIAGNOSIS — I8390 Asymptomatic varicose veins of unspecified lower extremity: Secondary | ICD-10-CM

## 2018-11-27 DIAGNOSIS — Z91030 Bee allergy status: Secondary | ICD-10-CM

## 2018-11-27 DIAGNOSIS — Z9109 Other allergy status, other than to drugs and biological substances: Secondary | ICD-10-CM

## 2018-11-27 DIAGNOSIS — M549 Dorsalgia, unspecified: Secondary | ICD-10-CM

## 2018-11-27 LAB — POCT URINE DIPSTICK
BILIRUBIN: NEGATIVE
BLOOD: NEGATIVE
GLUCOSE: NEGATIVE
KETONE: NEGATIVE
LEUKOCYTES: NEGATIVE
NITRITE: NEGATIVE
PH: 7.5
PROTEIN: NEGATIVE
SPECIFIC GRAVITY: 1.015
UROBILINOGEN: 0.2

## 2018-11-27 MED ORDER — EPINEPHRINE 0.3 MG/0.3 ML INJECTION, AUTO-INJECTOR
0.30 mg | AUTO-INJECTOR | Freq: Once | INTRAMUSCULAR | 0 refills | Status: DC | PRN
Start: 2018-11-27 — End: 2019-11-07

## 2018-11-27 MED ORDER — LINACLOTIDE 72 MCG CAPSULE
72.0000 ug | ORAL_CAPSULE | Freq: Every morning | ORAL | 3 refills | Status: DC
Start: 2018-11-27 — End: 2019-11-07

## 2018-11-27 MED ORDER — TRAMADOL 50 MG TABLET
1.00 | ORAL_TABLET | ORAL | 3 refills | Status: DC | PRN
Start: 2018-11-27 — End: 2020-09-08

## 2018-11-27 MED ORDER — FLUTICASONE PROPIONATE 50 MCG/ACTUATION NASAL SPRAY,SUSPENSION
1.0000 | Freq: Every day | NASAL | 5 refills | Status: DC
Start: 2018-11-27 — End: 2019-11-07

## 2018-11-27 NOTE — Nursing Note (Signed)
11/27/18 1500   Required: Location Test Performed At:   [facility] [address], [place], [place] [address]   Urine test  (Siemens Multistix 10 SG)   Time collected 1556   Color Yellow   Clarity Clear   Glucose Negative   Bilirubin Negative   Ketones Negative   Urine Specific Gravity 1.015   Blood (urine) Negative   pH 7.5   Protein Negative   Urobilinogen 0.2mg /dL (Normal)   Nitrite Negative   Leukocytes Negative   Performed Status Automated   Bottle Number   (Siemens Multistix 10 SG) [id number]   Lot # [id number]   Expiration Date 11/03/19

## 2018-11-27 NOTE — Nursing Note (Signed)
Chief Complaint:   Chief Complaint            Back Pain     Medication Check     Varicose Veins         Functional Health Screen        Vital Signs  BP 130/78   Pulse 84   Temp 36.7 C (98.1 F) (Oral)   Resp 18   Ht 1.727 m (5\' 8" )   Wt 79.8 kg (176 lb)   BMI 26.76 kg/m       Social History     Tobacco Use   Smoking Status Never Smoker   Smokeless Tobacco Never Used     Allergies  Allergies   Allergen Reactions   . Percocet [Oxycodone-Acetaminophen] Nausea/ Vomiting   . Polyethylene Glycol Hives/ Urticaria   . Polyethylene Glycol 3350 Hives/ Urticaria   . Beesting [Hymenoptera Allergenic Extract]    . Procaine  Other Adverse Reaction (Add comment)   . Venom-Wasp      Medication History  Reviewed for OTC medication and any new medications, provider will review medication history  Care Team  Patient Care Team:  [name], MD as PCP - General ([place])  Britt Bottom, MD (Inactive) ([facility])  Immunizations - last 24 hours     None        Elsie Ra, [place]  11/27/2018, 15:53

## 2018-11-27 NOTE — Progress Notes (Signed)
Outpatient Visit Note (Assment/Plan;Subjective;Objective)     Patient: [name] Provider: [name], MD   Date of Birth: Jul 02, 1950 Date of Visit 11/27/2018      Assessment: Plan:       ICD-10-CM    1. Back pain, unspecified back location, unspecified back pain laterality, unspecified chronicity  UA continues to be free of signs of infection  Pain is mostly due to L4-L5 anterolisthesis grade 1 , will send to physical therapy, refill tramadol and increasing  stool softener to bid/ trial of Miralax  or trial of linzess due to chronic constipation M54.9 POCT URINE DIPSTICK     PHYSICAL THERAPY (EVALUATE AND TREAT)     Refer to External Provider-pain management     traMADoL (ULTRAM) 50 mg Oral Tablet   2. Bee sting allergy  Refill request Z91.030 EPINEPHrine 0.3 mg/0.3 mL Injection Auto-Injector   3. Environmental allergies  Refill request Z91.09 fluticasone propionate (FLONASE) 50 mcg/actuation Nasal Spray, Suspension   4. Varicose veins of lower extremity, unspecified laterality, unspecified whether complicated Y60.63 Refer to External Provider-vascular surgeon       Orders Placed This Encounter   . Refer to External Provider   . Refer to External Provider   . PHYSICAL THERAPY (EVALUATE AND TREAT)   . POCT URINE DIPSTICK   . EPINEPHrine 0.3 mg/0.3 mL Injection Auto-Injector   . fluticasone propionate (FLONASE) 50 mcg/actuation Nasal Spray, Suspension   . linaCLOtide (LINZESS) 72 mcg Oral Capsule   . traMADoL (ULTRAM) 50 mg Oral Tablet     Current Outpatient Medications   Medication Sig   . calcium-vits D3-C-K2-minerals 166.75 mg- 166.75 unit Oral Capsule calcium   . cephalexin (KEFLEX) 500 mg Oral Capsule Take 1 Cap (500 mg total) by mouth Twice daily   . coenzyme Q10 300 mg Oral Capsule Take 300 mg by mouth   . diclofenac sodium (VOLTAREN) 50 mg Oral Tablet, Delayed Release (E.C.)    . EPINEPHrine 0.3 mg/0.3 mL Injection Auto-Injector 0.3 mL (0.3 mg total) by IntraMUSCULAR route Once, as needed   .  fluticasone propionate (FLONASE) 50 mcg/actuation Nasal Spray, Suspension 1 Spray by Each Nostril route Once a day   . fluticasone propionate (FLONASE) 50 mcg/actuation Nasal Spray, Suspension 1 Spray by Each Nostril route Once a day   . galcanezumab-gnlm (EMGALITY SYRINGE SUBQ) by Subcutaneous route   . Glucosamine HCl 1,500 mg Oral Tablet Take by mouth   . levoFLOXacin (LEVAQUIN) 750 mg Oral Tablet Take 1 Tab (750 mg total) by mouth Once a day   . lidocaine (XYLOCAINE) 5 % Ointment Apply topically   . linaCLOtide (LINZESS) 72 mcg Oral Capsule Take 1 Cap (72 mcg total) by mouth Every morning   . Magnesium 30 mg Oral Tablet magnesium   . MELOXICAM ORAL Take by mouth   . omega-3s/dha/epa/fish oil/D3 (VITAMIN-D + OMEGA-3 ORAL) Vitamin D   . phenazopyridine (PYRIDIUM) 100 mg Oral Tablet Take 1 Tab (100 mg total) by mouth Three times a day as needed for Pain   . traMADoL (ULTRAM) 50 mg Oral Tablet Take 1 Tab (50 mg total) by mouth Every 4 hours as needed for Pain   . Zolmitriptan (ZOMIG) 5 mg Oral Tablet TAKE AT ONSET OF HEADACHE, MAY REPEAT IN 2 HOURS IF NEEDED. MAX OF 2 PER DAY MAX OF 3 DAYS PER WEEK             follow up in prn    [address]214    Subjective:   CC:  Chief Complaint   Patient presents with   . Back Pain   . Medication Check   . Varicose Veins     [name] is a 68 y.o. female here for      Patient states that she finished the Levaquin and continues to have low back pain that is worse with movement or sitting.  She denies significant radiation however she has noticed some right lateral knee pain.  She also has noticed that her varicose veins have gotten worse recently.  She denies any swelling of her legs or erythema.  Patient reports that she frequently wears compression stockings without significant relief.  She would like to be referred to a vascular surgeon.  She states that she did take the tramadol which has helped the pain.  She is concerned about her chronic constipation that it may become  worse.  She usually takes a stool softener 1 tablet daily but has noticed that the constipation is more significant when she takes the tramadol.  She is and has no blood in her stools and her last colonoscopy 2 years ago was normal.  Other medical issues include:  Patient Active Problem List   Diagnosis   . Pancytopenia   . Left lower quadrant pain   . Iron deficiency   . Fatigue   . Dyspnea   . 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HCC)     No changes in [facility]    Past Medical History:   Diagnosis Date   . Breast lump 2005    Resolved prior to biopsy   . Detached retina    . Esophageal reflux    . Hx of migraines    . Iron deficiency anemia    . LLQ abdominal pain    . Migraine    . Osteoporosis    . Pancytopenia    . Prolapsed uterus          Past Surgical History:   Procedure Laterality Date   . HX BREAST BIOPSY Bilateral     benign    . HX CESAREAN SECTION  1993   . HX COLONOSCOPY     . HX ENDOSCOPIC SINUS SURGERY     . HX TONSILLECTOMY  1955         Family Medical History:     Problem Relation (Age of Onset)    Asthma Mother, Sister, Brother    Colon Cancer Mother, Father    Congestive Heart Failure Mother    Diabetes Father    No Known Problems Daughter, Maternal Grandmother, Maternal Grandfather, Paternal 55, Paternal Grandfather, Son, Maternal Aunt, Maternal Uncle, Paternal 40, Paternal Uncle, Other            Social History     Socioeconomic History   . Marital status: Married     Spouse name: Not on file   . Number of children: Not on file   . Years of education: Not on file   . Highest education level: Not on file   Occupational History   . 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 Smoking status: Never Smoker   . Smokeless tobacco: Never Used   Substance and Sexual Activity   . Alcohol use: No   . Drug use: No   . Sexual activity: Yes     Partners: Male   Other Topics Concern     Allergies   Allergen  Reactions   . Percocet [Oxycodone-Acetaminophen] Nausea/  Vomiting   . Polyethylene Glycol Hives/ Urticaria   . Polyethylene Glycol 3350 Hives/ Urticaria   . Beesting [Hymenoptera Allergenic Extract]    . Procaine  Other Adverse Reaction (Add comment)   . Venom-Wasp      Medications reviewed  Review of Systems: Other than ROS in the HPI, all other systems were negative.   Outpatient Medications Prior to Visit   Medication Sig Dispense Refill   . calcium-vits D3-C-K2-minerals 166.75 mg- 166.75 unit Oral Capsule calcium     . cephalexin (KEFLEX) 500 mg Oral Capsule Take 1 Cap (500 mg total) by mouth Twice daily 14 Cap 0   . coenzyme Q10 300 mg Oral Capsule Take 300 mg by mouth     . diclofenac sodium (VOLTAREN) 50 mg Oral Tablet, Delayed Release (E.C.)      . fluticasone propionate (FLONASE) 50 mcg/actuation Nasal Spray, Suspension 1 Spray by Each Nostril route Once a day 16 g 0   . galcanezumab-gnlm (EMGALITY SYRINGE SUBQ) by Subcutaneous route     . Glucosamine HCl 1,500 mg Oral Tablet Take by mouth     . levoFLOXacin (LEVAQUIN) 750 mg Oral Tablet Take 1 Tab (750 mg total) by mouth Once a day 5 Tab 0   . lidocaine (XYLOCAINE) 5 % Ointment Apply topically     . Magnesium 30 mg Oral Tablet magnesium     . MELOXICAM ORAL Take by mouth     . omega-3s/dha/epa/fish oil/D3 (VITAMIN-D + OMEGA-3 ORAL) Vitamin D     . phenazopyridine (PYRIDIUM) 100 mg Oral Tablet Take 1 Tab (100 mg total) by mouth Three times a day as needed for Pain 15 Tab 0   . Zolmitriptan (ZOMIG) 5 mg Oral Tablet TAKE AT ONSET OF HEADACHE, MAY REPEAT IN 2 HOURS IF NEEDED. MAX OF 2 PER DAY MAX OF 3 DAYS PER WEEK 9 Tab 11   . EPINEPHrine 0.3 mg/0.3 mL Injection Auto-Injector 0.3 mL (0.3 mg total) by Intramuscular route Once, as needed for up to 1 dose 2 Each 2   . fluticasone propionate (FLONASE) 50 mcg/actuation Nasal Spray, Suspension 1 Spray by Each Nostril route Once a day 1 Bottle 5   . rifAXIMin (XIFAXAN) 550 mg Oral Tablet Take 1 Tab (550 mg total) by mouth Twice daily 20 Tab 0   . traMADoL (ULTRAM) 50 mg  Oral Tablet Take 1 Tab (50 mg total) by mouth Every 4 hours as needed for Pain 30 Tab 0     No facility-administered medications prior to visit.      Objective:     BP 130/78   Pulse 84   Temp 36.7 C (98.1 F) (Oral)   Resp 18   Ht 1.727 m (5\' 8" )   Wt 79.8 kg (176 lb)   BMI 26.76 kg/m      Body mass index is 26.76 kg/m.    No LMP recorded. Patient is postmenopausal.  GEN: NAD  EXT: No edema, no erythema, bilateral LE with varicosities    Labs:   Office Visit on 11/27/2018   Component Date Value Ref Range Status   . LEUKOCYTES 11/27/2018 neg   Final   . NITRITE 11/27/2018 neg   Final   . UROBILINOGEN 11/27/2018 0.2   Final   . PROTEIN 11/27/2018 neg   Final   . [place] [address] 11/27/2018 7.5   Final   . BLOOD 11/27/2018 neg   Final   . SPECIFIC GRAVITY 11/27/2018 1.015  Final   . KETONE 11/27/2018 neg   Final   . BILIRUBIN 11/27/2018 neg   Final   . GLUCOSE 11/27/2018 neg   Final   . COLOR 11/27/2018 Yellow   Final   . CLARITY 11/27/2018 clear   Final   Office Visit on 11/12/2018   Component Date Value Ref Range Status   . LEUKOCYTES 11/12/2018 neg   Final   . NITRITE 11/12/2018 neg   Final   . UROBILINOGEN 11/12/2018 0.2   Final   . PROTEIN 11/12/2018 neg   Final   . [place] 11/12/2018 6.0   Final   . BLOOD 11/12/2018 neg   Final   . SPECIFIC GRAVITY 11/12/2018 1.020   Final   . KETONE 11/12/2018 15mg    Final   . BILIRUBIN 11/12/2018 neg   Final   . GLUCOSE 11/12/2018 neg   Final   . COLOR 11/12/2018 Yellow   Final   . CLARITY 11/12/2018 clear   Final         [name], MD  [place], PRIMARY [facility]ARE [place]  [address]  [place] [address]  Dept: [phone]  Dept Fax: [phone]  Loc: [phone]  [place] Fax: [phone]

## 2018-11-28 ENCOUNTER — Encounter (INDEPENDENT_AMBULATORY_CARE_PROVIDER_SITE_OTHER): Payer: Self-pay

## 2018-12-18 ENCOUNTER — Other Ambulatory Visit (HOSPITAL_BASED_OUTPATIENT_CLINIC_OR_DEPARTMENT_OTHER): Payer: Self-pay | Admitting: Family Medicine

## 2018-12-18 DIAGNOSIS — M549 Dorsalgia, unspecified: Secondary | ICD-10-CM

## 2018-12-20 ENCOUNTER — Ambulatory Visit (INDEPENDENT_AMBULATORY_CARE_PROVIDER_SITE_OTHER): Payer: Medicare PPO | Admitting: Surgery

## 2018-12-20 ENCOUNTER — Encounter (INDEPENDENT_AMBULATORY_CARE_PROVIDER_SITE_OTHER): Payer: Self-pay | Admitting: Surgery

## 2018-12-20 VITALS — BP 107/62 | HR 74 | Ht 68.0 in | Wt 177.0 lb

## 2018-12-20 DIAGNOSIS — I83811 Varicose veins of right lower extremities with pain: Secondary | ICD-10-CM

## 2018-12-20 NOTE — Progress Notes (Signed)
[address]  [place] [address], [address]  [place], [place] [address]  [phone]    Name: Nicole Sutton, [id number]  DOB: 06-15-1950  Date: 12/20/2018 5:01 PM    Chief Complaint:     " Bilateral lower extremity painful varicose veins"    History of Presenting Illness:     Nicole Sutton is a 68 y.o. female who presents to the office as a new patient, referred by Nicole Simas, MD, for evaluation of bilateral lower extremity painful varicose veins. Her symptoms include pain and itching. She has noticed prominent veins on the bilateral which has been ongoing for many years and have gradually worsened. Her symptoms are worsened by standing  and walking, and alleviated by resting.     The patient does not have a history of DVT.    Therapies attempted include prescription compression stockings: temporarily effective    Her risk factors for venous insufficiency include age over 50, family history and female gender.    These issues do have a significant impact on the patient's activities of daily living.    Compression therapy has been worn on a consistent basis for many years.    Past Medical History:     Past Medical History:   Diagnosis Date   . Bronchitis    . Bronchitis    . Bronchitis    . Colon polyp    . Diarrhea    . Gastroesophageal reflux disease        Past Surgical History:     Past Surgical History:   Procedure Laterality Date   . CESAREAN SECTION     . COLONOSCOPY N/A 02/25/2015    Procedure: COLONOSCOPY;  Surgeon: Nicole Cloud, MD;  [facility];  Service: Gastroenterology;  Laterality: N/A;   . COLONOSCOPY N/A 03/13/2018    Procedure: COLONOSCOPY;  Surgeon: Nicole Cloud, MD;  [facility];  Service: Gastroenterology;  Laterality: N/A;   . EGD N/A 05/17/2016    Procedure: EGD;  Surgeon: Nicole Cloud, MD;  [facility];  Service: Gastroenterology;  Laterality: N/A;   . TONSILLECTOMY         Family History:   History reviewed. No pertinent  family history.    Social History:     Social History     Tobacco Use   Smoking Status Never Smoker   Smokeless Tobacco Never Used     Social History     Substance and Sexual Activity   Alcohol Use Yes     Social History     Substance and Sexual Activity   Drug Use Never       Allergies:     Allergies   Allergen Reactions   . Percocet [Oxycodone-Acetaminophen] Nausea And Vomiting       Medications:     Current Outpatient Medications   Medication Sig Dispense Refill   . Cholecalciferol (VITAMIN D-3 PO) Take 2,000 IU by mouth.     . Coenzyme Q10 (COQ-10 PO) Take by mouth     . Diclofenac Sodium (VOLTAREN PO) Take by mouth     . DICLOFENAC SODIUM PO Take by mouth     . Fluticasone Propionate (FLONASE NA) by Nasal route     . Fluticasone Propionate (FLONASE NA) by Nasal route     . Galcanezumab-gnlm (EMGALITY SC) Inject into the skin     . GLUCOSAMINE-CHONDROITIN PO Take by mouth     . LIDOCAINE EX Apply topically     .  Magnesium Oxide 500 MG Tab Take 1,000 mg by mouth Once a day     . MAGNESIUM PO Take by mouth     . MELOXICAM PO Take by mouth     . Omega-3 Fatty Acids (OMEGA-3 FISH OIL) 500 MG Cap Take 1,000 mg by mouth.     . raNITIdine (ZANTAC) 300 MG capsule Take 300 mg by mouth every evening.     . rifAXIMin (XIFAXAN PO) Take by mouth     . SUMAtriptan (IMITREX) 100 MG tablet Take 1 Tab (100 mg total) by mouth Once, as needed for Migraine for 1 dose May repeat in 2 hours in needed     . UNABLE TO FIND Live probiotics     . UNABLE TO FIND Calcium vits d 3 c k2     Vit d omega 3       . ZOLMitriptan (ZOMIG) 5 MG tablet Take 5 mg by mouth as needed for Migraine.       No current facility-administered medications for this visit.        Review of Systems:     Constitutional: Negative for fever, chills, diaphoresis, appetite change and unexpected weight change.   HEENT: Negative for trouble swallowing, amaurosis fugax, and voice change.    Respiratory: Negative for chest tightness, increased cough or shortness of breath  and wheezing.    Cardiovascular: Negative for chest pain, irregular heartbeat or palpitations, orthopnea.   Gastrointestinal: Negative for nausea, vomiting, abdominal pain, diarrhea, constipation, abdominal distention, or GI bleed.   Genitourinary: Negative for dysuria, urgency, hematuria and difficulty urinating.   Skin: No rashes, negative for ulcerations, negative for skin changes (ie. CVI).  Extremities: Negative for claudication, rest pain, gangrene, negative for history of DVT.  Negative for vein stripping.  Negative for edema.  Neurological: Negative for dizziness, one-sided extremity weakness or paralysis, extremity numbness, extremity tingling, dizziness, or syncope    Physical Exam:     Vitals:    12/20/18 1039   BP: 107/62   Pulse: 74   SpO2: 99%     Body mass index is 26.91 kg/m.       General: alert, conversant, well developed female, no acute distress  HEENT: sclerae anicteric  Cardiovascular: regular rate and rhythm  Lungs: normal respiratory effort, unlabored  Vascular: LUE: 2+ palpable radial, RUE: 2+ palpable radial, palpable pedal pulses  Extremities: no clubbing or cyanosis  RIGHT lower extremity with telangiectasias, reticular veins and varicose veins  LEFT lower extremity with telangiectasias, reticular veins and varicose veins  Neuro: Cranial nerves II-XII are grossly intact. Motor and sensory function of the upper and lower extremities are grossly intact.  Psych: intact judgement and insight. Oriented to person, place and situation.     Imaging:     None    Assessment:     1. Varicose veins of leg with pain, right  US Venous Insufficency Duplx Dopp Low Extrem Ltd Bilat       CEAP Classification  C2 - varicose veins    Plan:     1. Elevation of the affected extremity as able.  2. Schedule for venous insufficiency duplex.  3. Return for follow up in 4 weeks  4. Continue risk factor modification to include walking program, weight loss, along with strict avoidance of all tobacco products as  appropriate.    Recommend she continue to wear stockings and follow-up as directed.    Signed by: Nicole Sutton    Primary Care Physician:  Nicole Simas, MD  Referring Provider: @REFPROVFL @

## 2019-01-07 ENCOUNTER — Encounter (INDEPENDENT_AMBULATORY_CARE_PROVIDER_SITE_OTHER): Payer: Self-pay | Admitting: Urology

## 2019-01-09 ENCOUNTER — Ambulatory Visit (INDEPENDENT_AMBULATORY_CARE_PROVIDER_SITE_OTHER): Payer: Medicare Other

## 2019-01-09 ENCOUNTER — Other Ambulatory Visit: Payer: Self-pay

## 2019-01-09 ENCOUNTER — Encounter (INDEPENDENT_AMBULATORY_CARE_PROVIDER_SITE_OTHER): Payer: Self-pay

## 2019-01-09 VITALS — BP 104/67 | HR 77 | Temp 98.4°F | Resp 18 | Ht 68.0 in | Wt 165.0 lb

## 2019-01-09 DIAGNOSIS — J029 Acute pharyngitis, unspecified: Secondary | ICD-10-CM

## 2019-01-09 DIAGNOSIS — J069 Acute upper respiratory infection, unspecified: Secondary | ICD-10-CM

## 2019-01-09 MED ORDER — AMOXICILLIN 875 MG-POTASSIUM CLAVULANATE 125 MG TABLET
1.00 | ORAL_TABLET | Freq: Two times a day (BID) | ORAL | 0 refills | Status: AC
Start: 2019-01-09 — End: 2019-01-16

## 2019-01-09 NOTE — Nursing Note (Signed)
BP 104/67 (Site: Right, Patient Position: Sitting, Cuff Size: Adult)   Pulse 77   Temp 36.9 C (98.4 F) (Oral)   Resp 18   Ht 1.727 m (5\' 8" )   Wt 74.8 kg (165 lb)   SpO2 96%   BMI 25.09 kg/m     Ivan Anchors, RTR  01/09/2019, 16:59\HC

## 2019-01-09 NOTE — Progress Notes (Signed)
[facility]  URGENT CARE, [facility]  [address] [place] [address]  [phone]        ID: [name] is a 68 y.o. female  DOB: 01/01/1951   Date of Service: 01/09/2019       chief complaint:   Chief Complaint   Patient presents with   . Sore Throat         SUBJECTIVE:    [name] is a 68 y.o. female who presents to Urgent Care on 01/09/2019 complaining of several days of sore throat and nasal congestion postnasal drip and slight cough.  The patient states she might have had some fevers but has had no measured fevers and she has had some chills, fatigue and generalized malaise.  She states that she was in [place] recently and had no known exposure to COVID-19 but she is not certain if she was exposed to COVID-19 or not.  The patient states that she has off and prescribed an antibiotic for symptoms like this in the past.      Review of Systems:  Constitutional:  Positive for subjective fevers along with chills, fatigue and generalized malaise.  Ears, nose, mouth, throat, and face:  Positive for sore throat, nasal congestion and postnasal drip and sinus pain and sinus pressure.  Respiratory:  Positive slight cough.  Negative for cough, shortness of breath and difficulty breathing.  Cardiovascular: Negative for chest pain or chest pressure/discomfort   GYN: No LMP recorded. Patient is postmenopausal.   Integument/breast: Negative for rash  Musculoskeletal: Negative for myalgias and arthralgias        Medication list:  Current Outpatient Medications   Medication Sig   . calcium-vits D3-C-K2-minerals 166.75 mg- 166.75 unit Oral Capsule calcium        . coenzyme Q10 300 mg Oral Capsule Take 300 mg by mouth   . diclofenac sodium (VOLTAREN) 50 mg Oral Tablet, Delayed Release (E.C.)    . EPINEPHrine 0.3 mg/0.3 mL Injection Auto-Injector 0.3 mL (0.3 mg total) by IntraMUSCULAR route Once, as needed   . fluticasone propionate (FLONASE) 50 mcg/actuation  Nasal Spray, Suspension 1 Spray by Each Nostril route Once a day   . fluticasone propionate (FLONASE) 50 mcg/actuation Nasal Spray, Suspension 1 Spray by Each Nostril route Once a day   . galcanezumab-gnlm (EMGALITY SYRINGE SUBQ) by Subcutaneous route   . Glucosamine HCl 1,500 mg Oral Tablet Take by mouth        . lidocaine (XYLOCAINE) 5 % Ointment Apply topically   . linaCLOtide (LINZESS) 72 mcg Oral Capsule Take 1 Cap (72 mcg total) by mouth Every morning   . Magnesium 30 mg Oral Tablet magnesium   . MELOXICAM ORAL Take by mouth   . omega-3s/dha/epa/fish oil/D3 (VITAMIN-D + OMEGA-3 ORAL) Vitamin D   . phenazopyridine (PYRIDIUM) 100 mg Oral Tablet Take 1 Tab (100 mg total) by mouth Three times a day as needed for Pain   . traMADoL (ULTRAM) 50 mg Oral Tablet Take 1 Tab (50 mg total) by mouth Every 4 hours as needed for Pain   . Zolmitriptan (ZOMIG) 5 mg Oral Tablet TAKE AT ONSET OF HEADACHE, MAY REPEAT IN 2 HOURS IF NEEDED. MAX OF 2 PER DAY MAX OF 3 DAYS PER WEEK       Allergy list:  Allergies   Allergen Reactions   . Percocet [Oxycodone-Acetaminophen] Nausea/ Vomiting   . Polyethylene Glycol Hives/ Urticaria   . Polyethylene Glycol 3350 Hives/  Urticaria   . Beesting [Hymenoptera Allergenic Extract]    . Procaine  Other Adverse Reaction (Add comment)   . Venom-Wasp        Past medical history:  Past Medical History:   Diagnosis Date   . Breast lump 2005    Resolved prior to biopsy   . Detached retina    . Esophageal reflux    . Hx of migraines    . Iron deficiency anemia    . LLQ abdominal pain    . Migraine    . Osteoporosis    . Pancytopenia    . Prolapsed uterus            Past surgical history  Past Surgical History:   Procedure Laterality Date   . HX BREAST BIOPSY Bilateral     benign    . HX CESAREAN SECTION  1993   . HX COLONOSCOPY     . HX ENDOSCOPIC SINUS SURGERY     . HX TONSILLECTOMY  1955           Social history:  Social History     Socioeconomic History   . Marital status: Married     Spouse name: Not  on file   . Number of children: Not on file   . Years of education: Not on file   . Highest education level: Not on file   Occupational History   . 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 Financial resource strain: Not on file   . Food insecurity     Worry: Not on file     Inability: Not on file   . Transportation needs     Medical: Not on file     Non-medical: Not on file   Tobacco Use   . Smoking status: Never Smoker   . Smokeless tobacco: Never Used   Substance and Sexual Activity   . Alcohol use: No   . Drug use: No   . Sexual activity: Yes     Partners: Male   Lifestyle   . Physical activity     Days per week: Not on file     Minutes per session: Not on file   . Stress: Not on file   Relationships   . Social Product manager on phone: Not on file     Gets together: Not on file     Attends religious service: Not on file     Active member of club or organization: Not on file     Attends meetings of clubs or organizations: Not on file     Relationship status: Not on file   . Intimate partner violence     Fear of current or ex partner: Not on file     Emotionally abused: Not on file     Physically abused: Not on file     Forced sexual activity: Not on file   Other Topics Concern   . Abuse/Domestic Violence Not Asked   . Breast Self Exam Not Asked   . Caffeine Concern Not Asked   . Calcium intake adequate Not Asked   . Computer Use Not Asked   . Drives Not Asked   . Exercise Concern Not Asked   . Helmet Use Not Asked   . Seat Belt Not Asked   . Special Diet Not Asked   . Sunscreen  used Not Asked   . Uses Cane Not Asked   . Uses walker Not Asked   . Uses wheelchair Not Asked   . Right hand dominant Not Asked   . Left hand dominant Not Asked   . Ambidextrous Not Asked   . Shift Work Not Asked   . Unusual Sleep-Wake Schedule Not Asked   Social History Narrative   . Not on file       Family history:  Family Medical History:     Problem Relation (Age of  Onset)    Asthma Mother, Sister, Brother    Colon Cancer Mother, Father    Congestive Heart Failure Mother    Diabetes Father    No Known Problems Daughter, Maternal Grandmother, Maternal Grandfather, Paternal Grandmother, Paternal Grandfather, Son, Maternal Aunt, Maternal Uncle, Paternal 9, Paternal Uncle, Other                  OBJECTIVE:    BP 104/67 (Site: Right, Patient Position: Sitting, Cuff Size: Adult)   Pulse 77   Temp 36.9 C (98.4 F) (Oral)   Resp 18   Ht 1.727 m (5\' 8" )   Wt 74.8 kg (165 lb)   SpO2 96%   BMI 25.09 kg/m      (vitals obtained by nursing staff and reviewed by myself)    General: Patient appears acutely ill but stated age. Patient is in no respiratory distress.  Eyes: Conjunctiva clear. Pupils equal and round. Sclera non-icteric.   Ears, Nose, Mouth, Throat and Face:  The bilateral tympanic membranes are not erythematous or bulging and there is no purulent exudate behind the TMs.   Bilateral ear canals are normal.  Bilateral nares demonstrates pale nasal turbinates with clear to slightly yellow rhinorrhea.  Oropharynx demonstrates no erythema, edema, exudate or ulceration. There is no cervical lymphadenopathy. There is no frontal sinus tenderness. There is no maxillary sinus tenderness.   Neck:  Neck is supple with no meningeal signs.  Lungs: Clear to auscultation bilaterally without wheezing or rhonchi. Thoracic excursion is normal.  Cardiovascular: Heart regular rate and rhythm without murmur. S1 and S2 are normal at this time. Bilateral radial pulses are 3+. No peripheral edema.    Musculoskeletal/Extremities: Normal muscle mass with no bony deformity.   Skin: Skin color, texture, turgor normal. No rashes or lesions  Psychiatric: AOx3 and patient with normal speech pattern, normal thought content and apparent normal judgment.        ASSESSMENT:    Encounter Diagnoses   Name Primary?   Marland Kitchen Upper respiratory tract infection, unspecified type Yes   . Sore throat        [name] is a 68 y.o. female who presented to the Urgent Care on 01/09/2019 for upper respiratory tract infection.          PLAN:    ICD-10-CM    1. Upper respiratory tract infection, unspecified type J06.9 amoxicillin-pot clavulanate (AUGMENTIN) 875-125 mg Oral Tablet   2. Sore throat J02.9 COVID-19 SCREENING - OUTPATIENT     Medication Orders   Medications   . amoxicillin-pot clavulanate (AUGMENTIN) 875-125 mg Oral Tablet     Sig: Take 1 Tab by mouth Twice daily for 7 days     Dispense:  14 Tab     Refill:  0          Upper respiratory tract infection:  Patient with signs and symptoms of an upper respiratory tract infection that is likely viral in  etiology however, due to patient concern, I did prescribe her a Safety Net Antibiotic Prescription for Augmentin with instructions as to when to take the Augmentin and as to how to take the Augmentin if she should needed.  C diff precautions were given.  COVID-19 testing was obtained and sent for further analysis and test results are pending at this time.  The patient was educated on over-the-counter and conservative management of their symptoms at this time including, but not limited to, using Tylenol, over-the-counter cough and cold medications along with good hand hygiene in order to treat their current infection and help prevent future infections.  Patient was educated on maintaining their hydration status with oral fluid intake (water) along with getting plenty of rest in order to allow their body to heal.    Follow up:  Patient is to go immediately to the ED or return to the urgent care if their symptoms should worsen or change in any way that is at all concerning and they are to follow-up with a primary care provider if their symptoms fail to improve over the next 7-14 days.   All questions answered.  Patient understands and agrees with plan.    Portions of this note may be dictated using voice recognition software or a dictation service. Variances in  spelling and vocabulary are possible and unintentional. Not all errors are caught/corrected. Please notify the [facility] if any discrepancies are noted or if the meaning of any statement is not clear.     Martin Majestic, MD

## 2019-01-09 NOTE — Patient Instructions (Addendum)
URGENT CARE, [facility]  [address]  [place] [address]  Phone: [phone]  Fax: [phone]           Open Daily 8:00am - 8:00pm, except Sundays 12pm-8pm         ~ Closed Thanksgiving and Christmas Day     Attending Caregiver: Martin Majestic, MD    Today's orders:   Orders Placed This Encounter   . COVID-19 SCREENING - OUTPATIENT   . amoxicillin-pot clavulanate (AUGMENTIN) 875-125 mg Oral Tablet          Please go immediately to the emergency department or return to the urgent care if your symptoms should worsen or change in any way that is at all concerning. Please follow up with your primary care provider if your symptoms should fail to significantly improve over the next 1-2 weeks.        Please maintain your hydration status by drinking clear fluids.  If you are having difficulty sipping/drinking regular water you can mix half water and half ginger ale with a pinch of salt added for every 8 oz of fluid. Simply sip this mixture every 15-30 minutes in order to try and stay hydrated.  You can also use Sprite instead of ginger ale or clear Gatorade instead of Sprite or ginger ale.          Clostridium difficile as a bacteria that is in the intestines that can cause severe diarrhea after being on an antibiotic.  In order to try and help prevent from happening please eat yogurt with live or active cultures (Greek Yogurt) along with taking a probiotic on a daily basis for at least 3 months after you have completed the antibiotic treatment.          Prescription(s) E-Rx to:  [place], [address]    ________________________________________________________________________  Short Term Disability and [facility] does NOT provide assistance with any disability applications.  If you feel your medical condition requires you to be on disability, you will need to follow up with  Your primary care physician or a specialist.  We  apologize for any inconvenience.    For Medication Prescribed by [facility]:  As an Urgent Care facility, our clinic does NOT offer prescription refills over the telephone.    If you need more of the medication one of our medical providers prescribed, you will  Either need to be re-evaluated by [place] or see your primary care physician.    ________________________________________________________________________      It is very important that we have a phone number that is the single best way to contact you in the event that we become aware of important clinical information or concerns after your discharge.  If the phone number you provided at registration is NOT this number you should inform staff and registration prior to leaving.      Your treatment and evaluation today was focused on identifying and treating potentially emergent conditions based on your presenting signs, symptoms, and history.  The resulting initial clinical impression and treatment plan is not intended to be definitive or a substitute for a full physical examination and evaluation by your primary care provider.  If your symptoms persist, worsen, or you develop any new or concerning symptoms, you need to be evaluated.      If you received x-rays during your visit, be aware that the final and formal  interpretation of those films by a radiologist may occur after your discharge.  If there is a significant discrepancy identified after your discharge, we will contact you at the telephone number provided at registration.      If you received a pelvic exam, you may have cultures pending for sexually transmitted diseases.  Positive cultures are reported to the [place] Department of Health as required by state law.  You should be contacted if you cultures are positive.  We will not contact you if they are negative.  You did NOT receive a PAP smear (the screening test for cervical).  This specific test for women is best performed by your gynecologist or  primary care provider when indicated.      If you are over 47 year old, we cannot discuss your personal health information with a parent, spouse, family member, or anyone else without your express consent.  This does not include those who have legitimate access to your records and information to assist in your care under the provisions of HIPAA ([place]) law, or those to whom you have previously given express written consent to do so, such a legal guardian or Power of [place].      You may have received medication that may cause you to feel drowsy and/or light headed for several hours.  You may even experience some amnesia of your stay.  You should avoid operating a motor vehicle or performing any activity requiring complete alertness or coordination until you feel fully awake (approximately 24-48 hours).  Avoid alcoholic beverages.  You may also have a dry mouth for several hours.  This is a normal side effect and will disappear as the effects of the medication wear off.      Instructions discussed with patient upon discharge by clinical staff with all questions answered.  Please call [facility] [phone] if any further questions.  Go immediately to the emergency department if any concern or worsening symptoms.    Martin Majestic, MD 01/09/2019, 17:11

## 2019-01-10 ENCOUNTER — Ambulatory Visit: Payer: Medicare Other

## 2019-01-10 DIAGNOSIS — J029 Acute pharyngitis, unspecified: Secondary | ICD-10-CM | POA: Insufficient documentation

## 2019-01-10 DIAGNOSIS — Z20828 Contact with and (suspected) exposure to other viral communicable diseases: Secondary | ICD-10-CM | POA: Insufficient documentation

## 2019-01-10 LAB — COVID-19 PANTHER - LAB USE ONLY: SARS-CoV-2: NOT DETECTED

## 2019-01-11 ENCOUNTER — Telehealth (INDEPENDENT_AMBULATORY_CARE_PROVIDER_SITE_OTHER): Payer: Self-pay

## 2019-01-11 NOTE — Telephone Encounter (Signed)
Patient seen in Urgent Care two days ago. Called patient. Patient stated that she still has a sore throat and some congestion, and is going to fill the prescription for the antibiotic that she was given. Patient is aware that her Covid 19 test was negative. Patient had no further questions or concerns.

## 2019-01-11 NOTE — Progress Notes (Signed)
Nursing staff,    Please call patient and let her know that her COVID-19 test is negative.     Thank you!    Nicole Yamaguchi J [name], MD

## 2019-01-12 ENCOUNTER — Other Ambulatory Visit: Payer: Self-pay

## 2019-01-12 ENCOUNTER — Encounter (INDEPENDENT_AMBULATORY_CARE_PROVIDER_SITE_OTHER): Payer: Self-pay | Admitting: Family Medicine

## 2019-01-12 ENCOUNTER — Ambulatory Visit (INDEPENDENT_AMBULATORY_CARE_PROVIDER_SITE_OTHER): Payer: Medicare Other | Admitting: Family Medicine

## 2019-01-12 VITALS — BP 108/54 | HR 78 | Temp 98.3°F | Ht 68.0 in | Wt 165.0 lb

## 2019-01-12 DIAGNOSIS — S50862A Insect bite (nonvenomous) of left forearm, initial encounter: Secondary | ICD-10-CM

## 2019-01-12 DIAGNOSIS — R21 Rash and other nonspecific skin eruption: Secondary | ICD-10-CM

## 2019-01-12 DIAGNOSIS — W57XXXA Bitten or stung by nonvenomous insect and other nonvenomous arthropods, initial encounter: Secondary | ICD-10-CM

## 2019-01-12 MED ORDER — DOXYCYCLINE HYCLATE 100 MG CAPSULE
200.0000 mg | ORAL_CAPSULE | Freq: Once | ORAL | 0 refills | Status: AC
Start: 2019-01-12 — End: 2019-01-12

## 2019-01-12 NOTE — Patient Instructions (Signed)
[address], [place]  [address]  [place] [address]  Phone: [phone]  Fax: [phone]           Open Daily 8:00am - 8:00pm, except Sundays 12pm-8pm         ~ Closed Thanksgiving and Christmas Day     Attending Caregiver: Jodean Lima, MD    Today's orders: No orders of the defined types were placed in this encounter.       Prescription(s) E-Rx to:  [facility], [address]    ________________________________________________________________________  Short Term Disability and [facility] does NOT provide assistance with any disability applications.  If you feel your medical condition requires you to be on disability, you will need to follow up with  Your primary care physician or a specialist.  We apologize for any inconvenience.    For Medication Prescribed by [facility]:  As an Urgent Care facility, our clinic does NOT offer prescription refills over the telephone.    If you need more of the medication one of our medical providers prescribed, you will  Either need to be re-evaluated by [place] or see your primary care physician.    ________________________________________________________________________      It is very important that we have a phone number that is the single best way to contact you in the event that we become aware of important clinical information or concerns after your discharge.  If the phone number you provided at registration is NOT this number you should inform staff and registration prior to leaving.      Your treatment and evaluation today was focused on identifying and treating potentially emergent conditions based on your presenting signs, symptoms, and history.  The resulting initial clinical impression and treatment plan is not intended to be definitive or a substitute for a full physical examination and evaluation by your primary care provider.  If your symptoms persist,  worsen, or you develop any new or concerning symptoms, you need to be evaluated.      If you received x-rays during your visit, be aware that the final and formal interpretation of those films by a radiologist may occur after your discharge.  If there is a significant discrepancy identified after your discharge, we will contact you at the telephone number provided at registration.      If you received a pelvic exam, you may have cultures pending for sexually transmitted diseases.  Positive cultures are reported to the [place] Department of Health as required by state law.  You should be contacted if you cultures are positive.  We will not contact you if they are negative.  You did NOT receive a PAP smear (the screening test for cervical).  This specific test for women is best performed by your gynecologist or primary care provider when indicated.      If you are over 74 year old, we cannot discuss your personal health information with a parent, spouse, family member, or anyone else without your express consent.  This does not include those who have legitimate access to your records and information to assist in your care under the provisions of HIPAA ([place]) law, or those to whom you have previously given express written consent to do so, such a legal guardian or Power of [place].      You may have received medication that may cause you to feel  drowsy and/or light headed for several hours.  You may even experience some amnesia of your stay.  You should avoid operating a motor vehicle or performing any activity requiring complete alertness or coordination until you feel fully awake (approximately 24-48 hours).  Avoid alcoholic beverages.  You may also have a dry mouth for several hours.  This is a normal side effect and will disappear as the effects of the medication wear off.      Instructions discussed with patient upon discharge by clinical staff with all questions  answered.  Please call [facility] [phone] if any further questions.  Go immediately to the emergency department if any concern or worsening symptoms.    Jodean Lima, MD 01/12/2019, 18:38

## 2019-01-12 NOTE — Progress Notes (Signed)
[place], [place]  [address]  [place] [address]  Dept: [phone]  Dept Fax: [phone]  Loc: [phone]  [place] Fax: [phone]     Patient Name: [name]  Date of Service: 01/12/19  Date of Birth: 12/16/1950    Chief Complaint: Tick Removal (left forearm)      HPI: Tykeisha Peer is a 68 y.o. female who presents today with tick bite on the left forearm that she noticed today.  She says that she was working in her front yard and noticed an area of black spot with surrounding redness on the dorsal aspect of left forearm.  She got concerned about tick bite and came here to be evaluated..    Patient denies any upper respiratory symptoms including fever, chills, cough, shortness of breath, headache, body ache, sore throat, loss of smell or taste, exposure to COVID or concern for COVID.    History:  Vital signs and history as obtained by clinical staff.  Past Medical History:   Diagnosis Date   . Breast lump 2005    Resolved prior to biopsy   . Detached retina    . Esophageal reflux    . Hx of migraines    . Iron deficiency anemia    . LLQ abdominal pain    . Migraine    . Osteoporosis    . Pancytopenia    . Prolapsed uterus          Past Surgical History:   Procedure Laterality Date   . HX BREAST BIOPSY Bilateral     benign    . HX CESAREAN SECTION  1993   . HX COLONOSCOPY     . HX ENDOSCOPIC SINUS SURGERY     . HX TONSILLECTOMY  1955         Family Medical History:     Problem Relation (Age of Onset)    Asthma Mother, Sister, Brother    Colon Cancer Mother, Father    Congestive Heart Failure Mother    Diabetes Father    No Known Problems Daughter, Maternal Grandmother, Maternal Grandfather, Paternal 64, Paternal Grandfather, Son, Maternal Aunt, Maternal Uncle, Paternal 97, Paternal Uncle, Other            Social History     Socioeconomic History   . Marital status: Married     Spouse name: Not on file   . Number of  children: Not on file   . Years of education: Not on file   . Highest education level: Not on file   Occupational History   . 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 Smoking status: Never Smoker   . Smokeless tobacco: Never Used   Substance and Sexual Activity   . Alcohol use: No   . Drug use: No   . Sexual activity: Yes     Partners: Male   Other Topics Concern     Expanded Substance History     Additional history       Allergies:  Allergies   Allergen Reactions   . Percocet [Oxycodone-Acetaminophen] Nausea/ Vomiting   . Polyethylene Glycol Hives/ Urticaria   . Polyethylene Glycol 3350 Hives/ Urticaria   . Beesting [Hymenoptera Allergenic Extract]    . Procaine  Other Adverse Reaction (Add comment)   . Venom-Wasp      Problem List:  Patient Active Problem List    Diagnosis   . 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HCC)   . Dyspnea   . Fatigue   . Iron deficiency   . Left lower quadrant pain   . Pancytopenia     Medication:  Outpatient Encounter Medications as of 01/12/2019   Medication Sig Dispense Refill   . amoxicillin-pot clavulanate (AUGMENTIN) 875-125 mg Oral Tablet Take 1 Tab by mouth Twice daily for 7 days 14 Tab 0   . calcium-vits D3-C-K2-minerals 166.75 mg- 166.75 unit Oral Capsule calcium     . coenzyme Q10 300 mg Oral Capsule Take 300 mg by mouth     . diclofenac sodium (VOLTAREN) 50 mg Oral Tablet, Delayed Release (E.C.)      . doxycycline hyclate (VIBRAMYCIN) 100 mg Oral Capsule Take 2 Caps (200 mg total) by mouth One time for 1 dose 2 Cap 0   . EPINEPHrine 0.3 mg/0.3 mL Injection Auto-Injector 0.3 mL (0.3 mg total) by IntraMUSCULAR route Once, as needed 2 Each 0   . fluticasone propionate (FLONASE) 50 mcg/actuation Nasal Spray, Suspension 1 Spray by Each Nostril route Once a day 16 g 0   . fluticasone propionate (FLONASE) 50 mcg/actuation Nasal Spray, Suspension 1 Spray by Each Nostril route Once a day 1 Bottle 5   . galcanezumab-gnlm (EMGALITY SYRINGE SUBQ) by  Subcutaneous route     . Glucosamine HCl 1,500 mg Oral Tablet Take by mouth     . levoFLOXacin (LEVAQUIN) 750 mg Oral Tablet Take 1 Tab (750 mg total) by mouth Once a day 5 Tab 0   . lidocaine (XYLOCAINE) 5 % Ointment Apply topically     . linaCLOtide (LINZESS) 72 mcg Oral Capsule Take 1 Cap (72 mcg total) by mouth Every morning 30 Cap 3   . Magnesium 30 mg Oral Tablet magnesium     . MELOXICAM ORAL Take by mouth     . omega-3s/dha/epa/fish oil/D3 (VITAMIN-D + OMEGA-3 ORAL) Vitamin D     . phenazopyridine (PYRIDIUM) 100 mg Oral Tablet Take 1 Tab (100 mg total) by mouth Three times a day as needed for Pain 15 Tab 0   . traMADoL (ULTRAM) 50 mg Oral Tablet Take 1 Tab (50 mg total) by mouth Every 4 hours as needed for Pain 60 Tab 3   . Zolmitriptan (ZOMIG) 5 mg Oral Tablet TAKE AT ONSET OF HEADACHE, MAY REPEAT IN 2 HOURS IF NEEDED. MAX OF 2 PER DAY MAX OF 3 DAYS PER WEEK 9 Tab 11     No facility-administered encounter medications on file as of 01/12/2019.        Review of Systems:  Pertinent items are noted in HPI. All pertinent positives and negatives noted in the HPI  Constitutional: No recent illness, no fever, no chills, no night sweats, no weight loss, no loss of appetite.  Neuro: No dizziness, No lightheadedness, No syncope, no hx of seizures  Eyes: No blurring of vision or diplopia.  ENT: No sore throat. No rhinorrhea, No sinus pains, No nasal congestion.  Respiratory: No cough, No shortness of breath, No wheezing, No hemoptysis.  Cardiovascular: no chest pain  No palpitation, No exertional dyspnea  Skin: + ?  Tick bite on left forearm with surrounding redness.    Exam:  General Vitals: BP (!) 108/54   Pulse 78   Temp 36.8 C (98.3 F)   Ht 1.727 m (5\' 8" )   Wt 74.8 kg (165 lb)   SpO2 98%   BMI 25.09 kg/m  General: acutely ill and mild distress  Eyes: Conjunctivae/corneas clear, PERRLA, EOM's intact.   Head - normocephalic, atraumatic.  Neck- supple  Skin:  Black spot was noticed on dorsal aspect of the  left forearm with surrounding redness.  Neurologic: alert and oriented x3, CN 2-12 grossly intact, B/L upper and lower extremity muscle strength-5/5     Assessment and Plan:    ICD-10-CM    1. Tick bite, initial encounter W57.XXXA LYME ANTIBODY PANEL WITH REFLEX   2. Erythematous rash R21      Medication Orders   Medications   . doxycycline hyclate (VIBRAMYCIN) 100 mg Oral Capsule     Sig: Take 2 Caps (200 mg total) by mouth One time for 1 dose     Dispense:  2 Cap     Refill:  0   Tick bite on left forearm-on careful examination of the site of possible tick bite, the area of black spot was noticed to be a blood clot.  I looked at the black spot under the microscope and concluded it to be a blood clot.  Patient is still concerned about possibility of tick bite because of surrounding redness.  She would prefer getting prophylactic antibiotics which I will prescribe as above.  Will also order Lyme test that can be done in 6 weeks.    Antibiotics as above, with warnings including allergic reaction, and C. Difficile, both of which can be severe. Patient advised these type of reactions are  unpredictable. Yogurt/probiotics prophylaxis discussed. Pt verbalised understanding to th above plan & verbalised understanding.All questions answered completely.    Plan was discussed and patient/parent verbalized understanding.  If symptoms are not improving within the next couple of days,  advised patient/parent  to followup with primary care or return to the Urgent Care for further evaluation.  Go to Emergency Department immediately for further work up if worsening symptoms or other medical concerns.    Jodean Lima, MD  01/12/2019, 18:38

## 2019-01-12 NOTE — Nursing Note (Signed)
BP (!) 108/54   Pulse 78   Temp 36.8 C (98.3 F)   Ht 1.727 m (5\' 8" )   Wt 74.8 kg (165 lb)   SpO2 98%   BMI 25.09 kg/m     Gretchen Short, CMA  01/12/2019, 18:35

## 2019-01-16 ENCOUNTER — Ambulatory Visit (HOSPITAL_BASED_OUTPATIENT_CLINIC_OR_DEPARTMENT_OTHER): Payer: Medicare Other

## 2019-01-22 ENCOUNTER — Ambulatory Visit (HOSPITAL_BASED_OUTPATIENT_CLINIC_OR_DEPARTMENT_OTHER): Payer: Medicare Other

## 2019-01-29 ENCOUNTER — Telehealth (INDEPENDENT_AMBULATORY_CARE_PROVIDER_SITE_OTHER): Payer: Self-pay

## 2019-01-29 ENCOUNTER — Other Ambulatory Visit: Payer: Self-pay

## 2019-01-29 ENCOUNTER — Encounter (INDEPENDENT_AMBULATORY_CARE_PROVIDER_SITE_OTHER): Payer: Self-pay | Admitting: Family Medicine

## 2019-01-29 ENCOUNTER — Ambulatory Visit (INDEPENDENT_AMBULATORY_CARE_PROVIDER_SITE_OTHER): Payer: Medicare Other | Admitting: Family Medicine

## 2019-01-29 VITALS — BP 118/70 | HR 68 | Temp 98.7°F | Resp 20 | Ht 68.0 in | Wt 176.0 lb

## 2019-01-29 DIAGNOSIS — W57XXXA Bitten or stung by nonvenomous insect and other nonvenomous arthropods, initial encounter: Secondary | ICD-10-CM

## 2019-01-29 DIAGNOSIS — R5383 Other fatigue: Secondary | ICD-10-CM

## 2019-01-29 DIAGNOSIS — D649 Anemia, unspecified: Secondary | ICD-10-CM

## 2019-01-29 NOTE — Telephone Encounter (Signed)
Jule Economy routed conversation to You 9 minutes ago (9:51 AM)        Jule Economy routed conversation to [place], [place] Id[place], [place], [place] 50 minutes ago (9:10 AM)       [name]  Patient Appointment Schedule Request Pool 1 hour ago (8:40 AM)          Appointment Request From: [name]    With Provider: [name], MD [facility], Primary Care [place]]    Preferred Date Range: 01/29/2019 - 01/30/2019    Preferred Times: Any Time    Reason for visit: Sick/Not Feeling Well    Comments:  Recently I have developed two superficial blood clots and have experienced severe cramping periodically in my legs.  My daughter, who is a physician, has indicated that I should be seen by my primary care doctor.

## 2019-01-29 NOTE — Nursing Note (Signed)
Chief Complaint:   Chief Complaint            Skin  Lesion     Fatigue         Functional Health Screen        Vital Signs  BP 118/70   Pulse 68   Temp 37.1 C (98.7 F) (Oral)   Resp 20   Ht 1.727 m (5\' 8" )   Wt 79.8 kg (176 lb)   BMI 26.76 kg/m       Social History     Tobacco Use   Smoking Status Never Smoker   Smokeless Tobacco Never Used     Allergies  Allergies   Allergen Reactions   . Percocet [Oxycodone-Acetaminophen] Nausea/ Vomiting   . Polyethylene Glycol Hives/ Urticaria   . Polyethylene Glycol 3350 Hives/ Urticaria   . Beesting [Hymenoptera Allergenic Extract]    . Procaine  Other Adverse Reaction (Add comment)   . Venom-Wasp      Medication History  Reviewed for OTC medication and any new medications, provider will review medication history  Care Team  Patient Care Team:  [name], MD as PCP - General ([place])  Britt Bottom, MD (Inactive) ([facility])  Immunizations - last 24 hours     None        Elsie Ra, [place]  01/29/2019, 15:36

## 2019-01-29 NOTE — Telephone Encounter (Signed)
Called patient regarding her patient schedule request.  Left message to have her return my call.

## 2019-01-29 NOTE — Telephone Encounter (Signed)
1030am Patient called back. Discussed with Dr. Sabra Heck.  Scheduled her appointment for 3:20pm today.

## 2019-01-29 NOTE — Progress Notes (Signed)
FAMILY MEDICINE, PRIMARY CARE [place]  [place] [address]     Outpatient Visit Note (Assment/Plan;Subjective;Objective)     Name: [name] MRN:  [id number]   Date: 01/29/2019 Age: 68 y.o.     Assessment & Plan:       ICD-10-CM    1. Tick bite   Site healed, no signs of infection,completed doxycycline W57.XXXA    2. Anemia, unspecified type   Screen for iron deficiency D64.9 CBC W/AUTO DIFF     FERRITIN     IRON TRANSFERRIN AND TIBC   3. Fatigue, unspecified type   Chronic, will continue to monitor for metabolic etiology 123456 COMPREHENSIVE METABOLIC PROFILE - [facility] ONLY     THYROID STIMULATING HORMONE WITH FREE T4 REFLEX     HGA1C (HEMOGLOBIN A1C WITH EST AVG GLUCOSE)     Problem List Items Addressed This Visit     Fatigue    Relevant Orders    COMPREHENSIVE METABOLIC PROFILE - [facility] ONLY    THYROID STIMULATING HORMONE WITH FREE T4 REFLEX    HGA1C (HEMOGLOBIN A1C WITH EST AVG GLUCOSE)      Other Visit Diagnoses     Tick bite    -  Primary    Anemia, unspecified type        Relevant Orders    CBC W/AUTO DIFF    FERRITIN    IRON TRANSFERRIN AND TIBC        Orders Placed This Encounter   . CBC W/AUTO DIFF   . FERRITIN   . IRON TRANSFERRIN AND TIBC   . COMPREHENSIVE METABOLIC PROFILE - [facility] ONLY   . THYROID STIMULATING HORMONE WITH FREE T4 REFLEX   . HGA1C (HEMOGLOBIN A1C WITH EST AVG GLUCOSE)     Current Outpatient Medications   Medication Sig   . calcium-vits D3-C-K2-minerals 166.75 mg- 166.75 unit Oral Capsule calcium   . coenzyme Q10 300 mg Oral Capsule Take 300 mg by mouth   . diclofenac sodium (VOLTAREN) 50 mg Oral Tablet, Delayed Release (E.C.)    . EPINEPHrine 0.3 mg/0.3 mL Injection Auto-Injector 0.3 mL (0.3 mg total) by IntraMUSCULAR route Once, as needed   . fluticasone propionate (FLONASE) 50 mcg/actuation Nasal Spray, Suspension 1 Spray by Each Nostril route Once a day   . fluticasone propionate (FLONASE) 50 mcg/actuation Nasal Spray, Suspension 1 Spray by Each Nostril  route Once a day   . galcanezumab-gnlm (EMGALITY SYRINGE SUBQ) by Subcutaneous route   . Glucosamine HCl 1,500 mg Oral Tablet Take by mouth   . levoFLOXacin (LEVAQUIN) 750 mg Oral Tablet Take 1 Tab (750 mg total) by mouth Once a day   . lidocaine (XYLOCAINE) 5 % Ointment Apply topically   . linaCLOtide (LINZESS) 72 mcg Oral Capsule Take 1 Cap (72 mcg total) by mouth Every morning   . Magnesium 30 mg Oral Tablet magnesium   . MELOXICAM ORAL Take by mouth   . omega-3s/dha/epa/fish oil/D3 (VITAMIN-D + OMEGA-3 ORAL) Vitamin D   . phenazopyridine (PYRIDIUM) 100 mg Oral Tablet Take 1 Tab (100 mg total) by mouth Three times a day as needed for Pain   . traMADoL (ULTRAM) 50 mg Oral Tablet Take 1 Tab (50 mg total) by mouth Every 4 hours as needed for Pain (Patient not taking: Reported on 01/29/2019)   . Zolmitriptan (ZOMIG) 5 mg Oral Tablet TAKE AT ONSET OF HEADACHE, MAY REPEAT IN 2 HOURS IF NEEDED. MAX OF 2 PER DAY MAX OF 3 DAYS PER WEEK  Follow up: prn    Subjective:   CC:   Chief Complaint   Patient presents with   . Skin  Lesion   . Fatigue     [name] is a 68 y.o. female here for Skin  Lesion and Fatigue  .    Patient states that 2 weeks ago she had been out in the garden and received what  she thought was a tick bite on her left wrist.  She went to urgent care who reported that a "blood clot" was superficially in the site and doxycycline was prescribed which she finished..  She was concerned when yesterday she saw a purple spot on her right arm and was concerned that it could be a blood clot also.  There has been no swelling of her arm or pain.  She states that she continues to have fatigue and cold intolerance.  She denies any abdominal pain or rectal bleeding.  She does continue to have intermittent soft stools.    Objective:   BP 118/70   Pulse 68   Temp 37.1 C (98.7 F) (Oral)   Resp 20   Ht 1.727 m (5\' 8" )   Wt 79.8 kg (176 lb)   BMI 26.76 kg/m      Body mass index is 26.76 kg/m.     No LMP recorded. Patient is postmenopausal.  GEN: NAD  LUNGS: CTA  B, anteriorly and posteriorly  HRT: RRR, neg murmur  EXT: No edema  SKIN:site on left wrist is healed without erythema,dorsum of right wrist with a small senile ecchymosis over a bony prominence, no swelling of the wrist , good radial pulse and NROM      [name], MD  [place], [place]  [address]  [place] [address]  Dept: [phone]  Dept Fax: [phone]  Loc: [place] Fax: [phone]

## 2019-01-30 ENCOUNTER — Ambulatory Visit (HOSPITAL_BASED_OUTPATIENT_CLINIC_OR_DEPARTMENT_OTHER): Payer: Medicare Other

## 2019-01-31 ENCOUNTER — Other Ambulatory Visit: Payer: Self-pay

## 2019-01-31 ENCOUNTER — Ambulatory Visit (INDEPENDENT_AMBULATORY_CARE_PROVIDER_SITE_OTHER): Payer: Medicare Other | Admitting: Urology

## 2019-01-31 ENCOUNTER — Encounter (INDEPENDENT_AMBULATORY_CARE_PROVIDER_SITE_OTHER): Payer: Self-pay | Admitting: Urology

## 2019-01-31 DIAGNOSIS — N12 Tubulo-interstitial nephritis, not specified as acute or chronic: Secondary | ICD-10-CM

## 2019-01-31 NOTE — Nursing Note (Signed)
01/31/19 1400   Required: Location Test Performed At:   [facility] [address] [place], [place] [address]   Urine test  (S[employer] Multistix 10 SG)   Time collected 1425   Color Yellow   Clarity Clear   Glucose Negative   Bilirubin Negative   Ketones Negative   Urine Specific Gravity 1.020   Blood (urine) Negative   pH 5.5   Protein Negative   Urobilinogen 0.2mg /dL (Normal)   Nitrite Negative   Leukocytes Negative   Performed Status Automated   Bottle Number   ([employer] Multistix 10 SG) [id number]   Lot # [id number]   Expiration Date 11/03/19   Initials DG

## 2019-02-01 NOTE — Progress Notes (Signed)
Patient is a 68 year old female who presents to review the results of her CT scan.  I reviewed her CT scan from November 20, 2018 with her in detail.  Patient was under the impression she had kidney stones that may be causing back pain, but I do not see any obvious stones on the scan.  There are some inflammatory changes involving the left kidney of unclear etiology.  She has not had dysuria or urinalysis suggestive of urinary tract infection.  No fevers or chills or other signs to suggest pyelonephritis.  She reports bilateral back pain which is more consistent with musculoskeletal etiologies.  Reviewed previous CT scan from 3 years ago done with contrast, which demonstrated normal-appearing kidneys at that time.  I will review CT scan with radiologist to get their opinion on whether follow-up with contrast enhanced imaging is warranted at this time.  She was reassured that she does not have any stones that require treatment.  Over 50% of a 25 minutes visit was spent counseling and coordinating her care.

## 2019-02-06 ENCOUNTER — Encounter (HOSPITAL_BASED_OUTPATIENT_CLINIC_OR_DEPARTMENT_OTHER): Payer: Self-pay

## 2019-02-06 ENCOUNTER — Ambulatory Visit (HOSPITAL_BASED_OUTPATIENT_CLINIC_OR_DEPARTMENT_OTHER): Payer: Medicare Other

## 2019-02-09 ENCOUNTER — Other Ambulatory Visit: Payer: Self-pay

## 2019-02-21 ENCOUNTER — Ambulatory Visit (INDEPENDENT_AMBULATORY_CARE_PROVIDER_SITE_OTHER): Payer: Medicare PPO | Admitting: Surgery

## 2019-03-07 ENCOUNTER — Ambulatory Visit: Payer: Medicare Other | Attending: Family Medicine

## 2019-03-07 ENCOUNTER — Other Ambulatory Visit: Payer: Self-pay

## 2019-03-07 ENCOUNTER — Ambulatory Visit
Admission: RE | Admit: 2019-03-07 | Discharge: 2019-03-07 | Disposition: A | Discharge status: Home / Self Care | Payer: Medicare PPO | Source: Ambulatory Visit | Attending: Surgery | Admitting: Surgery

## 2019-03-07 DIAGNOSIS — I83811 Varicose veins of right lower extremities with pain: Secondary | ICD-10-CM

## 2019-03-07 DIAGNOSIS — I872 Venous insufficiency (chronic) (peripheral): Secondary | ICD-10-CM | POA: Insufficient documentation

## 2019-03-07 DIAGNOSIS — I825Z1 Chronic embolism and thrombosis of unspecified deep veins of right distal lower extremity: Secondary | ICD-10-CM | POA: Insufficient documentation

## 2019-03-07 DIAGNOSIS — D649 Anemia, unspecified: Secondary | ICD-10-CM | POA: Insufficient documentation

## 2019-03-07 DIAGNOSIS — R5383 Other fatigue: Secondary | ICD-10-CM | POA: Insufficient documentation

## 2019-03-07 LAB — COMPREHENSIVE METABOLIC PROFILE - BMC/JMC ONLY
ALBUMIN/GLOBULIN RATIO: 1.5 (ref 0.8–2.0)
ALBUMIN: 3.7 g/dL (ref 3.5–5.0)
ALKALINE PHOSPHATASE: 45 U/L (ref 38–126)
ALT (SGPT): 31 U/L (ref 14–54)
ANION GAP: 10 mmol/L (ref 3–11)
AST (SGOT): 39 U/L (ref 15–41)
BILIRUBIN TOTAL: 0.7 mg/dL (ref 0.3–1.2)
BUN/CREA RATIO: 30 — ABNORMAL HIGH (ref 6–22)
BUN: 20 mg/dL (ref 6–20)
CALCIUM: 9.3 mg/dL (ref 8.8–10.2)
CHLORIDE: 102 mmol/L (ref 101–111)
CO2 TOTAL: 27 mmol/L (ref 22–32)
CREATININE: 0.66 mg/dL (ref 0.44–1.00)
ESTIMATED GFR: >60 mL/min/{1.73_m2} (ref >60)
GLUCOSE: 93 mg/dL (ref 70–110)
POTASSIUM: 4.1 mmol/L (ref 3.4–5.1)
PROTEIN TOTAL: 6.2 g/dL — ABNORMAL LOW (ref 6.4–8.3)
SODIUM: 139 mmol/L (ref 136–145)

## 2019-03-07 LAB — IRON TRANSFERRIN AND TIBC
IRON (TRANSFERRIN) SATURATION: 25 % (ref 15–50)
IRON: 79 ug/dL (ref 28–170)
TOTAL IRON BINDING CAPACITY: 319 ug/dL
TRANSFERRIN: 223 mg/dL (ref 192–282)

## 2019-03-07 LAB — CBC W/AUTO DIFF
BASOPHIL #: 0 10*3/uL (ref 0.00–0.10)
BASOPHIL %: 1 % (ref 0–3)
EOSINOPHIL #: 0.1 10*3/uL (ref 0.00–0.50)
EOSINOPHIL %: 3 % (ref 0–5)
HCT: 38.5 % (ref 36.0–45.0)
HGB: 12.8 g/dL (ref 12.0–15.5)
LYMPHOCYTE #: 1.1 10*3/uL (ref 1.00–4.80)
LYMPHOCYTE %: 24 % (ref 15–43)
MCH: 32.5 pg (ref 27.5–33.2)
MCHC: 33.3 g/dL (ref 32.0–36.0)
MCV: 97.6 fL — ABNORMAL HIGH (ref 82.0–97.0)
MONOCYTE #: 0.5 10*3/uL (ref 0.20–0.90)
MONOCYTE %: 10 % (ref 5–12)
MPV: 9.9 fL (ref 7.4–10.5)
NEUTROPHIL #: 2.8 10*3/uL (ref 1.50–6.50)
NEUTROPHIL %: 62 % (ref 43–76)
PLATELETS: 170 10*3/uL (ref 150–450)
RBC: 3.95 10*6/uL — ABNORMAL LOW (ref 4.00–5.10)
RDW: 13.7 % (ref 11.0–16.0)
WBC: 4.5 10*3/uL (ref 4.0–11.0)

## 2019-03-07 LAB — FERRITIN: FERRITIN: 21 ng/mL (ref 11–307)

## 2019-03-07 LAB — THYROID STIMULATING HORMONE WITH FREE T4 REFLEX: TSH: 1.467 u[IU]/mL (ref 0.340–5.330)

## 2019-03-07 LAB — HGA1C (HEMOGLOBIN A1C WITH EST AVG GLUCOSE)
ESTIMATED AVERAGE GLUCOSE: 108 mg/dL
HEMOGLOBIN A1C: 5.4 % (ref 4.0–5.6)

## 2019-03-21 ENCOUNTER — Other Ambulatory Visit (HOSPITAL_BASED_OUTPATIENT_CLINIC_OR_DEPARTMENT_OTHER): Payer: Self-pay | Admitting: Family Medicine

## 2019-03-21 ENCOUNTER — Encounter (INDEPENDENT_AMBULATORY_CARE_PROVIDER_SITE_OTHER): Payer: Self-pay | Admitting: Surgery

## 2019-03-21 ENCOUNTER — Ambulatory Visit (INDEPENDENT_AMBULATORY_CARE_PROVIDER_SITE_OTHER): Payer: Medicare PPO | Admitting: Surgery

## 2019-03-21 VITALS — BP 110/68 | HR 78 | Temp 97.8°F | Resp 18 | Ht 68.0 in | Wt 170.3 lb

## 2019-03-21 DIAGNOSIS — I83811 Varicose veins of right lower extremities with pain: Secondary | ICD-10-CM

## 2019-03-21 DIAGNOSIS — M549 Dorsalgia, unspecified: Secondary | ICD-10-CM

## 2019-03-21 NOTE — Progress Notes (Signed)
[address]  [place] [address], [address]  [place], [place] [address]  [id number]    Name: Nicole Sutton, [id number]  DOB: 1950-11-22  Date: 03/21/2019 1:49 PM    Chief Complaint:     " Bilateral lower extremity painful varicose veins"    History of Presenting Illness:     Nicole Sutton is a 68 y.o. female who presents to the office as a new patient, referred by  , for evaluation of bilateral lower extremity painful varicose veins. Her symptoms include pain and itching. She has noticed prominent veins on the bilateral which has been ongoing for many years and have gradually worsened. Her symptoms are worsened by standing  and walking, and alleviated by resting.     The patient does not have a history of DVT.    Therapies attempted include prescription compression stockings: temporarily effective    Her risk factors for venous insufficiency include age over 11, family history and female gender.    These issues do have a significant impact on the patient's activities of daily living.    Compression therapy has been worn on a consistent basis for many years.    No significant changes since I saw her last. Underwent duplex and is here today for the results.    Past Medical History:     Past Medical History:   Diagnosis Date   . Bronchitis    . Bronchitis    . Bronchitis    . Colon polyp    . Diarrhea    . Gastroesophageal reflux disease        Past Surgical History:     Past Surgical History:   Procedure Laterality Date   . CESAREAN SECTION     . COLONOSCOPY N/A 02/25/2015    Procedure: COLONOSCOPY;  Surgeon: Jayme Cloud, MD;  Location: [facility];  Service: Gastroenterology;  Laterality: N/A;   . COLONOSCOPY N/A 03/13/2018    Procedure: COLONOSCOPY;  Surgeon: Jayme Cloud, MD;  Location: [facility];  Service: Gastroenterology;  Laterality: N/A;   . EGD N/A 05/17/2016    Procedure: EGD;  Surgeon: Jayme Cloud, MD;  Location: [facility];  Service: Gastroenterology;   Laterality: N/A;   . TONSILLECTOMY         Family History:   History reviewed. No pertinent family history.    Social History:     Social History     Tobacco Use   Smoking Status Never Smoker   Smokeless Tobacco Never Used     Social History     Substance and Sexual Activity   Alcohol Use Yes     Social History     Substance and Sexual Activity   Drug Use Never       Allergies:     Allergies   Allergen Reactions   . Percocet [Oxycodone-Acetaminophen] Nausea And Vomiting       Medications:     Current Outpatient Medications   Medication Sig Dispense Refill   . Cholecalciferol (VITAMIN D-3 PO) Take 2,000 IU by mouth.     . Coenzyme Q10 (COQ-10 PO) Take by mouth     . Diclofenac Sodium (VOLTAREN PO) Take by mouth     . DICLOFENAC SODIUM PO Take by mouth     . Fluticasone Propionate (FLONASE NA) by Nasal route     . Galcanezumab-gnlm (EMGALITY SC) Inject into the skin     . GLUCOSAMINE-CHONDROITIN PO Take by mouth     .  LIDOCAINE EX Apply topically     . Magnesium Oxide 500 MG Tab Take 1,000 mg by mouth Once a day     . MELOXICAM PO Take by mouth     . Omega-3 Fatty Acids (OMEGA-3 FISH OIL) 500 MG Cap Take 1,000 mg by mouth.     . raNITIdine (ZANTAC) 300 MG capsule Take 300 mg by mouth every evening.     . rifAXIMin (XIFAXAN PO) Take by mouth     . UNABLE TO FIND Live probiotics     . UNABLE TO FIND Calcium vits d 3 c k2     Vit d omega 3       . ZOLMitriptan (ZOMIG) 5 MG tablet Take 5 mg by mouth as needed for Migraine.       No current facility-administered medications for this visit.        Review of Systems:     Constitutional: Negative for fever, chills, diaphoresis, appetite change and unexpected weight change.   HEENT: Negative for trouble swallowing, amaurosis fugax, and voice change.    Respiratory: Negative for chest tightness, increased cough or shortness of breath and wheezing.    Cardiovascular: Negative for chest pain, irregular heartbeat or palpitations, orthopnea.   Gastrointestinal: Negative for nausea,  vomiting, abdominal pain, diarrhea, constipation, abdominal distention, or GI bleed.   Genitourinary: Negative for dysuria, urgency, hematuria and difficulty urinating.   Skin: No rashes, negative for ulcerations, negative for skin changes (ie. CVI).  Extremities: Negative for claudication, rest pain, gangrene, negative for history of DVT.  Negative for vein stripping.  Negative for edema.  Neurological: Negative for dizziness, one-sided extremity weakness or paralysis, extremity numbness, extremity tingling, dizziness, or syncope    Physical Exam:     Vitals:    03/21/19 1052   BP: 110/68   Pulse: 78   Resp: 18   Temp: 97.8 F (36.6 C)   SpO2: 98%     Body mass index is 25.89 kg/m.       General: alert, conversant, well developed female, no acute distress  HEENT: sclerae anicteric  Cardiovascular: regular rate and rhythm  Lungs: normal respiratory effort, unlabored  Vascular: LUE: 2+ palpable radial, RUE: 2+ palpable radial, palpable pedal pulses  Extremities: no clubbing or cyanosis  RIGHT lower extremity with telangiectasias, reticular veins and varicose veins  LEFT lower extremity with telangiectasias, reticular veins and varicose veins  Neuro: Cranial nerves II-XII are grossly intact. Motor and sensory function of the upper and lower extremities are grossly intact.  Psych: intact judgement and insight. Oriented to person, place and situation.     Imaging:     03/07/19 Duplex        ILIAC V      CFV         FV         POP V     PERON V       PTV     GASTROC V      GSV        SSV                 R    L     R     L     R    L      R    L     R    L      R    L     R  L      R    L     R     L   Patency                  +     +     +    +      +    +     +    +      +    +                 +    +     +     +   Spontaneity              +     +     +    +      +    +     +    +      +    +                 +    +     +     +   Phasicity                +     +     +    +      +    +     +    +      +    +                  +    +     +     +   Augmentation             +     +     +    +      +    +     +    +      +    +                 +    +     +     +   Compression              +     +     +    +      +    +     +    +      +    +                 +    +     +     +   Reflux (sec)             1.2   0     1.6  0      0    0     0    0      0    0                 0    0     4.4   0   Legend   +  (Normal) 0  (Absent)  C  (Continuous)NV  (Not Visualized)   -   (Red1ucedI   (Increased)R  (Reflux)  SR   (  Surgically Removed)   )      INSUFFICIENCY   RIGHT             Greater Saphenous Vein         LEFT   Diam (mm)  Reflux (sec)            Diam (mm)  Reflux (sec)   5.5        0               SFJ     4.6        0   3.2        0            Prox Thigh 2.8        0   3.3        0            Mid Thigh  2.3        0   2.7        0           Distal Thigh2.8        0   2.7        0            Prox Calf  2.6        0   2.3        0             Mid Calf  1.1        0   2.7        0           Distal Calf 1.8        0                      Small Saphenous Vein   2.5        4.4          Prox Calf  2.3        0   2.5        1.6           Mid Calf  2.7        0   2.3        0           Distal Calf 2.2        0       FINDINGS    RIGHT:    No evidence of DVT noted in the right lower extremity.    Moderate reflux noted in the CFV and distal SFV.    Superficial phlebitis and severe reflux noted in the SSV.    LEFT:    No evidence of DVT or superifical phlebitis noted in the left lower extremity.    No evidence of reflux noted in the left lower extremity.       CONCLUSIONS    1.  No evidence of DVT in either lower extremity.    2.  Deep reflux of the right lower extremity.    3.  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    4.  There is no superficial venous reflux in the left lower extremity.                Assessment:     68 year old female with bilateral lower extremity spider veins.    CEAP Classification  C2  - varicose veins    Plan:     I reviewed her duplex.  She does have a right small saphenous venous reflux, but it is located proximal and associated with chronic SVT.  Otherwise she has no evidence of superficial venous reflux.  She does have evidence of lateral extremity deep venous reflux.  I recommended compressive therapy for treatment.  I do not believe she would benefit from any further vascular intervention or work-up.  Her right small saphenous vein does not appear amenable to endovenous ablation.  Furthermore, I do not believe that would resolve her symptoms.  I have offered sclerotherapy but again explained that this may not improve her overall symptomatology.  She will call my office if she is interested.    Signed by: Octaviano Batty    Primary Care Physician: Sophronia Simas, MD  Referring Provider: @REFPROVFL @

## 2019-03-31 ENCOUNTER — Ambulatory Visit (HOSPITAL_BASED_OUTPATIENT_CLINIC_OR_DEPARTMENT_OTHER): Payer: Medicare Other

## 2019-03-31 ENCOUNTER — Ambulatory Visit (INDEPENDENT_AMBULATORY_CARE_PROVIDER_SITE_OTHER): Payer: Medicare PPO | Admitting: Surgery

## 2019-04-04 ENCOUNTER — Ambulatory Visit (HOSPITAL_BASED_OUTPATIENT_CLINIC_OR_DEPARTMENT_OTHER)
Admission: RE | Admit: 2019-04-04 | Discharge: 2019-04-04 | Disposition: A | Discharge status: Still In Hospital | Payer: Medicare Other | Source: Ambulatory Visit

## 2019-04-04 DIAGNOSIS — M545 Low back pain: Secondary | ICD-10-CM | POA: Insufficient documentation

## 2019-04-04 DIAGNOSIS — M549 Dorsalgia, unspecified: Secondary | ICD-10-CM

## 2019-04-04 NOTE — PT Evaluation (Signed)
[facility]  [address],  [place]  [place], [place]  [address]  ([phone]   [phone]    Physical Therapy Initial Evaluation      Patient Name:  [name]  DOB:  12/06/1950    Start of Care:  05/05/19  Diagnosis:   Back pain   Onset of Injury:   Chronic  FOTO:  Intake:  57,  Complexity: Moderate    History of Injury:     Per past PT eval:  She reports a history of sciatica for the past 20 years that comes and goes.  2014, patient had a fall and broke her right elbow and at the same time hurt her knees and back.  Right shoulder pain complaints at least for the past year.  Pt recently had an injection at right shoulder and knee by Dr Hervey Ard. The injection helped her knee, but not her shoulder.   She is having trouble moving in bed due to placing weight through UE.  She is left handed.   F/U with MD 05/07/17.  She is having difficulty with ADL's, ambulation, driving, transfers.  Intense cramping at B calves.    Per 04/04/19 PT eval: Patient reports some vascular and neuropathy issues and is wearing compression hose.   She was also instructed by MD not to lift greater than 20#.  She reports LBP around sacral region with pain about 24 hours/day and she feels it is worsening.  She has no radicular symptoms.  She is still able to walk 10,000 steps/day.  She is having trouble with balance and catching toes recently due to neuropathy issues.   PMH:     Past Medical History:   Diagnosis Date   . Breast lump 2005    Resolved prior to biopsy   . Detached retina    . Esophageal reflux    . Hx of migraines    . Iron deficiency anemia    . LLQ abdominal pain    . Migraine    . Osteoporosis    . Pancytopenia    . Prolapsed uterus      Past Surgical History:   Procedure Laterality Date   . HX BREAST BIOPSY Bilateral     benign    . HX CESAREAN SECTION  1993   . HX COLONOSCOPY     . HX ENDOSCOPIC SINUS SURGERY     . HX TONSILLECTOMY  1955          Social:     Married.    Retired.  Imaging Studies Performed:     11/12/18:  L-spine x-rays:  FINDINGS: Compared to study from 04/06/2009. There is a new grade 1  anterolisthesis L4-5 measuring 10 mm. The remainder of the lumbar spine is  normally aligned. Mild degenerative disc disease at L4-5 and L5-S1. No  fractures or bony lesions. Degenerative facet disease L4-5 and L5-S1.    IMPRESSION:  Degenerative changes L4-5 and L5-S1 as above..  T-spine:  FINDINGS: Minimal thoracic scoliosis convex to right. No fractures.  Osteopenia. 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  Medications:     Current Outpatient Medications   Medication Sig   . calcium-vits D3-C-K2-minerals 166.75 mg- 166.75 unit Oral Capsule calcium   . coenzyme Q10 300 mg Oral Capsule Take 300 mg by mouth   . diclofenac sodium (VOLTAREN) 50 mg Oral Tablet, Delayed Release (E.C.)    .  EPINEPHrine 0.3 mg/0.3 mL Injection Auto-Injector 0.3 mL (0.3 mg total) by IntraMUSCULAR route Once, as needed   . fluticasone propionate (FLONASE) 50 mcg/actuation Nasal Spray, Suspension 1 Spray by Each Nostril route Once a day   . fluticasone propionate (FLONASE) 50 mcg/actuation Nasal Spray, Suspension 1 Spray by Each Nostril route Once a day   . galcanezumab-gnlm (EMGALITY SYRINGE SUBQ) by Subcutaneous route   . Glucosamine HCl 1,500 mg Oral Tablet Take by mouth   . levoFLOXacin (LEVAQUIN) 750 mg Oral Tablet Take 1 Tab (750 mg total) by mouth Once a day (Patient not taking: Reported on 01/31/2019)   . lidocaine (XYLOCAINE) 5 % Ointment Apply topically   . linaCLOtide (LINZESS) 72 mcg Oral Capsule Take 1 Cap (72 mcg total) by mouth Every morning   . Magnesium 30 mg Oral Tablet magnesium   . MELOXICAM ORAL Take by mouth   . omega-3s/dha/epa/fish oil/D3 (VITAMIN-D + OMEGA-3 ORAL) Vitamin D   . phenazopyridine (PYRIDIUM) 100 mg Oral Tablet Take 1 Tab (100 mg total) by mouth Three times a day as needed for Pain   . traMADoL (ULTRAM) 50 mg  Oral Tablet Take 1 Tab (50 mg total) by mouth Every 4 hours as needed for Pain (Patient not taking: Reported on 01/29/2019)   . Zolmitriptan (ZOMIG) 5 mg Oral Tablet TAKE AT ONSET OF HEADACHE, MAY REPEAT IN 2 HOURS IF NEEDED. MAX OF 2 PER DAY MAX OF 3 DAYS PER WEEK     ROM:     Trunk/L-spine:   Flexion=minimally limited,  Extension=moderately limited with LBP,  B rot=moderately limited with LBP,   B SB=moderately limited with LBP.  BLE's grossly WFL's.   Strength:     Right LE: Grossly 4+/5 except hip flexion=4- to 4/5  Left LE: Grossly 4+/5 except hip flexion=4- to 4/5  Pain:  0/10(best),  7/10(worst)   Better: Aleve, left sidelying   Worse:  Lifting, Stairs with step to step pattern                               Girth:     No edema at BLE's  Sensation:     Numbness/tingling at B feet.  Gait:     She is ambulating without AD on all surfaces for community distances with some LBP for longer distance ambulation.   Posture:     FH, bilateral rounded shoulders, flexed posture.  In standing: right iliac crest and PSIS slightly higher than left in standing.   Supine notes the same as well; however, to a lesser degree.   Palpation:     Tender to palpation at B SI joint (left >right) and sacral/coccyx region.  Muscular tightness noted at B lumbar paraspinals and gluteals.      Other:     Tight B piriformis, HS, ITB and GSC musculature.  Decreased sacral P/A mobility with 50% restriction noted.    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  She also reports BLE numbness/tingling.   2014 patient had a fall injuring her knee and fracturing her elbow and feels that some of her current symptoms may be coming from this accident as she hurt her back as well.  Patient may benefit from further PT to address the following problems:   Restricted ROM/flexibility   Decreased strength   Decreased functional activities   Loss of endurance   Pain limiting function with increased  muscle turgor as noted above   Decreased sacral mobility with tight musculature as noted above   Radicular complaints    STG's:  (2 weeks):     1.  Patient will be independent with HEP for ROM/flexibility and strengthening for increased ease of ADL's.     LTG's:  (10 visits):  Pt will:  1.  Increase trunk/L-spine ROM to minimal to no limitations in all planes for increased ease of ADL's and overall functional mobility.  2.  Improve sacral P/A mobility to 0-25% limitation for increased ease of ADL's and overall functional mobility.  3.  Decrease c/o pain at worst to 2-3/10 for increased ease of ADL's and overall functional mobility.   4.  Increase BLE strength to 4 to 4+/5 in all planes for increased ease of ambulation/functional mobility.   5.  Improve B piriformis, HS and GSC flexibility to WNL's for increased ease of ADL's.     Treatment may include:   Flexibility/ROM and strengthening exercises, HEP   Therapeutic exercise   Joint mobilization   Manual therapy   Manual traction   Mechanical traction   Electrical stimulation   Ultrasound   Moist Heat    Ice    Patient will be seen 2x/week x10 visits to work on goals stated above.   Patient instructed to con't with hooklying lumbar rotation, B SKTC, PPT, hip ER stretch and piriformis stretch.   She was also instructed to obtain a do-nut for sitting to help with sacral/coccyx pain.   The patient has been advised of his/her PT diagnosis, goals and POC.   Yes  The patient/family has given verbal consent for evaluation and treatment.   Yes    Total Evaluation Time:  53  Total Timed Treatment Minutes:   Total Treatment Time:  53 (eval)      Lucille Passy, MPT          ____________________________________________  Physician Signature                                       Date

## 2019-04-07 ENCOUNTER — Ambulatory Visit (HOSPITAL_BASED_OUTPATIENT_CLINIC_OR_DEPARTMENT_OTHER)
Admission: RE | Admit: 2019-04-07 | Discharge: 2019-04-07 | Disposition: A | Discharge status: Still In Hospital | Payer: Medicare Other | Source: Ambulatory Visit

## 2019-04-07 NOTE — PT Treatment (Signed)
[facility]  [address]   [place],  [place]  [address]  ([phone]   [phone]    Physical Therapy Treatment Note      Patient Name:  [name]  DOB:  1950/11/19    Visit:   2/10,   MEDICARE=05/04/19  Date of Surgery:   NA  Next appointment with physician:       Subjective:     "I helped move some furniture around yesterday and that flared it up again.  I know it wasn't smart, but my husband couldn't do it alone."   Objective:     Therex/HEP as follows:    Hooklying lumbar rotation  B SKTC  PPT  B hip ER stretch in hooklying with ankle crossed at opp knee  B piriformis stretch hooklying with ankle crossed at opp knee    Con't [place] 1.0 w/cm2 x8 min at 1 MHz:  -Left coccyx region   Assessment:     She demonstrated therex appropriately with some verbal cueing for proper performance and to hold stretches.   PPT was painful as this pressed coccyx into table causing pain; therefore, reps decreased to 5x today.   She tolerated [place] without issue after intensity was decreased from 1.3 to 1.0 w/cm2.  Discussed importance of con't compliance with HEP.  She may benefit from further PT to address ROM/flexibility and strength deficits for increased ease of ADL's and overall functional mobility.   Plan:     Con't with POC 2x/week x10 visits to address above noted deficits for increased ease of ADL's and overall functional mobility.       Total Timed Treatment Minutes:    25  Total Treatment Time:    33 (therex, [place])

## 2019-04-09 ENCOUNTER — Other Ambulatory Visit: Payer: Self-pay

## 2019-04-09 ENCOUNTER — Ambulatory Visit (HOSPITAL_BASED_OUTPATIENT_CLINIC_OR_DEPARTMENT_OTHER)
Admission: RE | Admit: 2019-04-09 | Discharge: 2019-04-09 | Disposition: A | Discharge status: Still In Hospital | Payer: Medicare Other | Source: Ambulatory Visit

## 2019-04-09 NOTE — PT Treatment (Signed)
[facility]  [address],   [place],  [place]  [address]  ([phone]   [phone]    Physical Therapy Treatment Note      Patient Name:  [name]  DOB:  Jan 27, 1951    Visit:   3/10,   MEDICARE=05/04/19  Date of Surgery:   NA  Next appointment with physician:       Subjective:     "I took it easier yesterday and today.  I didn't lift anything.  I can not do the pelvic tilts--they hurt so bad.  I do think the [place] helped though."   Objective:     Therex/HEP as follows:    Hooklying lumbar rotation  B SKTC  PPT-standing   B hip ER stretch in hooklying with ankle crossed at opp knee  B piriformis stretch hooklying with ankle crossed at opp knee    Con't [place] 1.0 w/cm2 x8 min at 1 MHz:  -Left coccyx region   Assessment:     Pelvic tilts performed standing today as supine PPT aggravates her symptoms.   She required verbal and manual cues to complete this appropriately.  She demonstrated the remainder of her ex's appropriately with some verbal cueing for proper performance.  Right pelvic upslip remains in standing and supine.  She is unable to tolerated correction of this at this time due to pain complaints; therefore, she was given 1/4" heel lift to trial in right shoe as this helped pain symptoms.   She tolerated [place] without issue with intensity of  1.0 w/cm2.  Discussed importance of con't compliance with HEP.  She may benefit from further PT to address ROM/flexibility and strength deficits for increased ease of ADL's and overall functional mobility.   Plan:     Con't with POC 2x/week x10 visits to address above noted deficits for increased ease of ADL's and overall functional mobility.       Total Timed Treatment Minutes:    27  Total Treatment Time:    35 (therex, [place])

## 2019-04-14 ENCOUNTER — Ambulatory Visit (HOSPITAL_BASED_OUTPATIENT_CLINIC_OR_DEPARTMENT_OTHER)
Admission: RE | Admit: 2019-04-14 | Discharge: 2019-04-14 | Disposition: A | Discharge status: Still In Hospital | Payer: Medicare Other | Source: Ambulatory Visit

## 2019-04-14 NOTE — PT Treatment (Signed)
[facility]  [address]   [place],  [name]  [address]  ([phone]   [phone]    Physical Therapy Treatment Note      Patient Name:  [name]  DOB:  10/02/1950    Visit:   4/10,   MEDICARE=05/04/19  Date of Surgery:   NA  Next appointment with physician:       Subjective:     "The [place] does help.   I did not move any furniture over the weekend, but I did rake leaves for 6 hours.  It did make me sore."  Spoke with her once again about time limits with these type of activities.  She did have some relief of symptoms while wearing heel lift and she is con't to wear it.   Objective:     Therex/HEP as follows:    B hip flexion and abduction--standing  Step ups, forward   Hooklying lumbar rotation  B SKTC  PPT-standing   B hip ER stretch in hooklying with ankle crossed at opp knee  B piriformis stretch hooklying with ankle crossed at opp knee    Con't [place] 1.0 w/cm2 x8 min at 1 MHz:  -Left coccyx region   Assessment:     Pt was able to complete pelvic tilts today in supine with pain complaints.  She also performed this over the weekend as well.  3 new standing ex's added to POC for BLE strengthening.  She will add these to HEP.   She demonstrated the remainder of her ex's appropriately with some verbal cueing for proper performance.  Focused on breathing technique today during therex performance.   She tolerated [place] without issue with intensity of  1.0 w/cm2.  Discussed importance of con't compliance with HEP.  She may benefit from further PT to address ROM/flexibility and strength deficits for increased ease of ADL's and overall functional mobility.   Plan:     Con't with POC 2x/week x10 visits to address above noted deficits for increased ease of ADL's and overall functional mobility.       Total Timed Treatment Minutes:    35  Total Treatment Time:   43 (therex, [place])

## 2019-04-16 ENCOUNTER — Ambulatory Visit (HOSPITAL_BASED_OUTPATIENT_CLINIC_OR_DEPARTMENT_OTHER)
Admission: RE | Admit: 2019-04-16 | Discharge: 2019-04-16 | Disposition: A | Discharge status: Still In Hospital | Payer: Medicare Other | Source: Ambulatory Visit

## 2019-04-16 ENCOUNTER — Other Ambulatory Visit: Payer: Self-pay

## 2019-04-16 ENCOUNTER — Encounter (INDEPENDENT_AMBULATORY_CARE_PROVIDER_SITE_OTHER): Payer: Self-pay | Admitting: Family Medicine

## 2019-04-16 ENCOUNTER — Ambulatory Visit (INDEPENDENT_AMBULATORY_CARE_PROVIDER_SITE_OTHER): Payer: Medicare Other | Admitting: Family Medicine

## 2019-04-16 VITALS — BP 125/53 | HR 75 | Temp 98.3°F | Ht 68.0 in | Wt 170.0 lb

## 2019-04-16 DIAGNOSIS — T24412A Corrosion of unspecified degree of left thigh, initial encounter: Secondary | ICD-10-CM

## 2019-04-16 DIAGNOSIS — T24511A Corrosion of first degree of right thigh, initial encounter: Secondary | ICD-10-CM

## 2019-04-16 DIAGNOSIS — L237 Allergic contact dermatitis due to plants, except food: Secondary | ICD-10-CM

## 2019-04-16 MED ORDER — MUPIROCIN 2 % TOPICAL OINTMENT
TOPICAL_OINTMENT | Freq: Two times a day (BID) | CUTANEOUS | 0 refills | Status: DC
Start: 2019-04-16 — End: 2020-11-03

## 2019-04-16 MED ORDER — CEPHALEXIN 500 MG CAPSULE
500.00 mg | ORAL_CAPSULE | Freq: Two times a day (BID) | ORAL | 0 refills | Status: AC
Start: 2019-04-16 — End: 2019-04-23

## 2019-04-16 MED ORDER — PREDNISONE 20 MG TABLET
ORAL_TABLET | ORAL | 0 refills | Status: DC
Start: 2019-04-16 — End: 2019-10-23

## 2019-04-16 NOTE — Nursing Note (Signed)
BP (!) 125/53   Pulse 75   Temp 36.8 C (98.3 F) (Oral)   Ht 1.727 m (5\' 8" )   Wt 77.1 kg (170 lb)   SpO2 98%   BMI 25.85 kg/m     Lucile Crater, MA  04/16/2019, 12:59
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
## 2019-04-16 NOTE — Patient Instructions (Signed)
Contact Dermatitis  Contact dermatitis is a skin rash caused by something that touches the skin and makes it irritated and inflamed. Your skin may be red, swollen, dry, and may be cracked. Blisters may form and ooze. The rash will itch.   Contact dermatitis often forms on the face and neck, backs of hands, forearms, genitals, and lower legs. But it can affect any area.   People can get contact dermatitis from lots of sources. These include:   Plants such as poison ivy, oak, or sumac   Chemicals in hair dyes and rinses, soaps, solvents, waxes, fingernail polish, and deodorants   Jewelry or watchbands made of nickel or cobalt  Contact dermatitis is not passed from person to person.  Talk with your healthcare provider about what may have caused the rash. A type of allergy testing called "patch testing" may be used to discover what you are allergic to. You will need to stay away from the source of the rash in the future to prevent it from coming back.   Treatment is done to ease itching and prevent the rash from coming back. The rash should go away in a few days to a few weeks.   Home care  Your healthcare provider may prescribe medicine to ease swelling and itching. Follow all instructions when using these medicines.   General care   Stay away from anything that heats up your skin, such as hot showers or baths, or direct sunlight. This can make itching worse.   Apply cold compresses to soothe your sores to help ease your symptoms. Do this for 30 minutes 3 to 4 times a day. You can make a cold compress by soaking a cloth in cold water. Squeeze out excess water. You can add colloidal oatmeal to the water to help reduce itching. For severe itching in a small area, apply an ice pack wrapped in a thin towel. Do this for 20 minutes 3 to 4 times a day.   You can also try wet dressings. One way to do this is to wear a wet piece of clothing under a dry one. Wear a damp shirt under a dry shirt if your upper body is  affected. This can relieve itching and prevent you from scratching the affected area.   You can also help ease large areas of itching by taking a lukewarm bath with colloidal oatmeal added to the water.   Use hydrocortisone cream for redness and irritation, unless another medicine was prescribed. Calamine lotion can also relieve mild symptoms.   Use oral diphenhydramine to help reduce itching. You can buy this antihistamine at drugstores and grocery stores. It can make you sleepy, so use lower doses during the daytime. Don't use diphenhydramine if you have glaucoma or have trouble urinating because of an enlarged prostate.   If a plant causes your rash, make sure to wash your skin and the clothes you were wearing when you came into contact with the plant. This is to wash away the plant oils that gave you the rash and prevent more or worse symptoms. If you have a pet that's been outdoors, its fur may also have oil from the plant. Bathe your pet with soap or shampoo.   Stay away from the substance or object that causes your symptoms. If you can't stay away from it, wear gloves or some other type of protection    Follow-up care  Follow up with your healthcare provider, or as advised.  When to seek medical   advice  Call your healthcare provider or seek medical attention right away if any of these occur:    Spreading of the rash to other parts of your body   Severe swelling of your face, eyelids, mouth, throat or tongue   Trouble urinating due to swelling in the genital area   Fever of 100.55F (38C) or higher, or as advised by your provider   Redness or swelling that gets worse   Pain that gets worse   Foul-smelling fluid leaking from the skin   Yellow-brown crusts on the open blisters  [employer] last reviewed this educational content on 01/03/2018   2000-2020 [employer]. [address] [place] [address]. All rights reserved. This information is not intended as a substitute for  professional medical care. Always follow your healthcare professional's instructions.        Chemical Burn of the Skin   You have been diagnosed with a chemical burn of the skin Chemicals on the skin may cause only mild irritation and redness. Or they may cause deep tissue injury. How serious the burn is depends on:    What kind of chemical it was   How diluted it was   How long it was on your skin  Home care  The following guidelines will help you care for your burn once you get home:    You may put a towel soaked in ice water on the affected area. Do this 3 to 4 times a day to ease pain or swelling.   If a bandage was put on, change it every day. Watch for the warning signs of infection listed below. If the wound is open, put an antibiotic ointment on it each day to prevent infection.   You may use over-the-counter medicine to control pain, unless another medicine was prescribed. If you have chronic liver or kidney disease, talk with your healthcare provider before using acetaminophen, naproxen, or ibuprofen. Also talk with your provider if you've had a stomach ulcer or GI (gastrointestinal) bleeding.   You may use over-the-counter medicine for itching. Try not to scratch or pick at the wound.   Wear loose-fitting clothing.   Protect your wound from the sun.  Follow-up care  Follow up with your healthcare provider, or as advised.   When to seek medical advice  Call your healthcare provider right awayif any of these occur:    Swelling, pain, or redness gets worse   Fluid or pus drains from the burn area   Fever of 100.55F (38C) or higher, or as directed by your provider   Wound doesn't heal   Nausea or vomiting   New or worsening symptoms  Stay[employer] last reviewed this educational content on 01/03/2018   2000-2020 [employer]. [address], [place] [address]. All rights reserved. This information is not intended as a substitute for professional medical care. Always follow your  healthcare professional's instructions.    [facility]  [address] [place]  [place], [place] [address]  Phone: [phone]  Fax: [phone]             Open Daily 8:00am - 8:00pm, except Sundays 12pm-8pm         ~ Closed Thanksgiving and Christmas Day     Attending Caregiver: Maura Crandall, MD      Today's orders: No orders of the defined types were placed in this encounter.       Prescription(s) E-Rx to:  [facility]    ________________________________________________________________________  Short Term Disability and [facility] does NOT provide assistance with any disability applications.  If you feel your medical condition requires you to be on disability, you will need to follow up with  Your primary care physician or a specialist.  We apologize for any inconvenience.    For Medication Prescribed by [facility]:  As an Urgent Care facility, our clinic does NOT offer prescription refills over the telephone.    If you need more of the medication one of our medical providers prescribed, you will  Either need to be re-evaluated by [place] or see your primary care physician.    ________________________________________________________________________      It is very important that we have a phone number that is the single best way to contact you in the event that we become aware of important clinical information or concerns after your discharge.  If the phone number you provided at registration is NOT this number you should inform staff and registration prior to leaving.      Your treatment and evaluation today was focused on identifying and treating potentially emergent conditions based on your presenting signs, symptoms, and history.  The resulting initial clinical impression and treatment plan is not intended to be definitive or a substitute for a full physical examination and evaluation by your primary care  provider.  If your symptoms persist, worsen, or you develop any new or concerning symptoms, you need to be evaluated.      If you received x-rays during your visit, be aware that the final and formal interpretation of those films by a radiologist may occur after your discharge.  If there is a significant discrepancy identified after your discharge, we will contact you at the telephone number provided at registration.      If you received a pelvic exam, you may have cultures pending for sexually transmitted diseases.  Positive cultures are reported to the [place] Department of Health as required by state law.  You should be contacted if you cultures are positive.  We will not contact you if they are negative.  You did NOT receive a PAP smear (the screening test for cervical).  This specific test for women is best performed by your gynecologist or primary care provider when indicated.      If you are over 69 year old, we cannot discuss your personal health information with a parent, spouse, family member, or anyone else without your express consent.  This does not include those who have legitimate access to your records and information to assist in your care under the provisions of HIPAA ([place]) law, or those to whom you have previously given express written consent to do so, such a legal guardian or Power of [employer] of [place].      You may have received medication that may cause you to feel drowsy and/or light headed for several hours.  You may even experience some amnesia of your stay.  You should avoid operating a motor vehicle or performing any activity requiring complete alertness or coordination until you feel fully awake (approximately 24-48 hours).  Avoid alcoholic beverages.  You may also have a dry mouth for several hours.  This is a normal side effect and will disappear as the effects of the medication wear off.      Instructions discussed with patient upon discharge  by  clinical staff with all questions answered.  Please call [facility] ([phone]) if any further questions.  Go immediately to the emergency department if any concern or worsening symptoms.      Maura Crandall, MD 04/16/2019, 14:00
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
## 2019-04-17 ENCOUNTER — Encounter (INDEPENDENT_AMBULATORY_CARE_PROVIDER_SITE_OTHER): Payer: Self-pay | Admitting: Family Medicine

## 2019-04-17 ENCOUNTER — Ambulatory Visit (INDEPENDENT_AMBULATORY_CARE_PROVIDER_SITE_OTHER): Payer: Medicare Other | Admitting: Family Medicine

## 2019-04-17 VITALS — BP 130/70 | HR 85 | Temp 98.0°F | Resp 16 | Ht 68.0 in | Wt 177.1 lb

## 2019-04-17 DIAGNOSIS — T300 Burn of unspecified body region, unspecified degree: Secondary | ICD-10-CM

## 2019-04-17 MED ORDER — ACETAMINOPHEN 300 MG-CODEINE 30 MG TABLET
1.00 | ORAL_TABLET | ORAL | 0 refills | Status: DC | PRN
Start: 2019-04-17 — End: 2020-09-08

## 2019-04-17 MED ORDER — SILVER SULFADIAZINE 1 % TOPICAL CREAM
TOPICAL_CREAM | Freq: Every day | CUTANEOUS | 0 refills | Status: DC
Start: 2019-04-17 — End: 2019-05-05

## 2019-04-17 NOTE — Nursing Note (Signed)
04/17/19 1100   Depression Screen   Little interest or pleasure in doing things. 0   Feeling down, depressed, or hopeless 0   PHQ 2 Total 0
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
## 2019-04-21 ENCOUNTER — Ambulatory Visit (HOSPITAL_BASED_OUTPATIENT_CLINIC_OR_DEPARTMENT_OTHER)
Admission: RE | Admit: 2019-04-21 | Discharge: 2019-04-21 | Disposition: A | Discharge status: Still In Hospital | Payer: Medicare Other | Source: Ambulatory Visit
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
## 2019-04-23 ENCOUNTER — Other Ambulatory Visit: Payer: Self-pay

## 2019-04-23 ENCOUNTER — Ambulatory Visit (HOSPITAL_BASED_OUTPATIENT_CLINIC_OR_DEPARTMENT_OTHER)
Admission: RE | Admit: 2019-04-23 | Discharge: 2019-04-23 | Disposition: A | Discharge status: Still In Hospital | Payer: Medicare Other | Source: Ambulatory Visit
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
## 2019-04-28 ENCOUNTER — Other Ambulatory Visit: Payer: Self-pay

## 2019-04-28 ENCOUNTER — Ambulatory Visit (HOSPITAL_BASED_OUTPATIENT_CLINIC_OR_DEPARTMENT_OTHER)
Admission: RE | Admit: 2019-04-28 | Discharge: 2019-04-28 | Disposition: A | Discharge status: Still In Hospital | Payer: Medicare Other | Source: Ambulatory Visit
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
## 2019-04-29 ENCOUNTER — Other Ambulatory Visit: Payer: Self-pay

## 2019-04-29 ENCOUNTER — Ambulatory Visit
Admission: RE | Admit: 2019-04-29 | Discharge: 2019-04-29 | Disposition: A | Discharge status: Home / Self Care | Payer: Medicare Other | Source: Ambulatory Visit | Attending: Family Medicine | Admitting: Family Medicine

## 2019-04-29 NOTE — PT Treatment (Signed)
[facility] Physical Therapy  [address]   [place] and [place],  [place]  [address]  ([phone]   [phone]    Physical Therapy Treatment Note      Patient Name:  [name]  DOB:  1950/06/13    Visit:   9/10,   MEDICARE=05/04/19  Date of Surgery:   NA  Next appointment with physician:       Subjective:     "I'm moving better and the burns are better so I can move more."     Objective:     Current measurements as follows:  Independent HEP   ROM:  Trunk/L-spine: Flexion=WNL's , Extension=minimally limited with some LBP , B rot and SB=moderately limited with LBP  BLE's grossly WFL's.   Strength:  Right LE: Grossly 4+/5 except hip flexion and abduction=4/5  Left LE: Grossly 4+/5 except hip flexion and abduction=4/5  Pain:  0/10(best),  4/10(worst)   FOTO:  Increased 2 points (59)    Therex/HEP as follows:    B hip flexion and abduction--standing  Step ups, forward   Hooklying lumbar rotation  B SKTC  PPT-standing   B hip ER stretch in hooklying with ankle crossed at opp knee  B piriformis stretch hooklying with ankle crossed at opp knee  Clam shells    Con't [place] 0.9 w/cm2 x8 min at 1 MHz:  -Left coccyx region   Assessment:     All therex performed once again today and she was given another copy of ex's + pics for HEP.   She is still bandaging BLE's, but is not using as many and feels that she can perform therex with better mobility.  Right knee pain with step ups once again today.  B hip flexor and abductor weakness remains.  RE measurements noted improved trunk/L-spine ROM with flexion/extension;however, rotation and SB remain restricted.  She will con't to work on this with HEP.  BLE strength remains the same with some hip flexor and abductor weakness.  Pain complaints have decreased slightly as well.    She tolerated [place] without issue with intensity of  0.9 w/cm2 with no stinging noted.  Discussed importance of con't compliance with HEP since  she will be d/c to HEP today.    Goals:  STG met.  LTG #1 for flexion/extension, 4 and 5 met.   Plan:     D/C to HEP.     Rae Halsted Deleon, MPT    Total Timed Treatment Minutes:  36  Total Treatment Time:  44  ([place], therex, RE measurements )

## 2019-04-30 ENCOUNTER — Ambulatory Visit (HOSPITAL_BASED_OUTPATIENT_CLINIC_OR_DEPARTMENT_OTHER): Payer: Medicare Other

## 2019-05-05 ENCOUNTER — Ambulatory Visit (HOSPITAL_BASED_OUTPATIENT_CLINIC_OR_DEPARTMENT_OTHER): Payer: Medicare Other

## 2019-05-05 ENCOUNTER — Encounter (INDEPENDENT_AMBULATORY_CARE_PROVIDER_SITE_OTHER): Payer: Self-pay | Admitting: Family Medicine

## 2019-05-05 ENCOUNTER — Other Ambulatory Visit (INDEPENDENT_AMBULATORY_CARE_PROVIDER_SITE_OTHER): Payer: Self-pay | Admitting: Family Medicine

## 2019-05-05 DIAGNOSIS — T300 Burn of unspecified body region, unspecified degree: Secondary | ICD-10-CM

## 2019-05-05 MED ORDER — SILVER SULFADIAZINE 1 % TOPICAL CREAM
TOPICAL_CREAM | Freq: Every day | CUTANEOUS | 0 refills | Status: DC
Start: 2019-05-05 — End: 2020-09-08
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
## 2019-05-07 ENCOUNTER — Ambulatory Visit (HOSPITAL_BASED_OUTPATIENT_CLINIC_OR_DEPARTMENT_OTHER): Payer: Medicare Other

## 2019-05-15 ENCOUNTER — Telehealth (INDEPENDENT_AMBULATORY_CARE_PROVIDER_SITE_OTHER): Payer: Self-pay | Admitting: Family Medicine

## 2019-05-15 DIAGNOSIS — Z1231 Encounter for screening mammogram for malignant neoplasm of breast: Secondary | ICD-10-CM

## 2019-05-15 NOTE — Telephone Encounter (Signed)
Pt requesting order for Mammo. Last seen 08/20.         [name]  05/15/2019, 08:37

## 2019-05-16 ENCOUNTER — Ambulatory Visit
Admission: RE | Admit: 2019-05-16 | Discharge: 2019-05-16 | Disposition: A | Discharge status: Home / Self Care | Payer: Medicare Other | Source: Ambulatory Visit | Attending: Family Medicine | Admitting: Family Medicine

## 2019-05-16 ENCOUNTER — Other Ambulatory Visit: Payer: Self-pay

## 2019-05-16 ENCOUNTER — Encounter (HOSPITAL_COMMUNITY): Payer: Self-pay

## 2019-05-16 DIAGNOSIS — Z1231 Encounter for screening mammogram for malignant neoplasm of breast: Secondary | ICD-10-CM | POA: Insufficient documentation

## 2019-05-16 DIAGNOSIS — R928 Other abnormal and inconclusive findings on diagnostic imaging of breast: Secondary | ICD-10-CM

## 2019-10-23 ENCOUNTER — Other Ambulatory Visit: Payer: Self-pay

## 2019-10-23 ENCOUNTER — Ambulatory Visit (INDEPENDENT_AMBULATORY_CARE_PROVIDER_SITE_OTHER): Payer: Medicare Other | Admitting: Family

## 2019-10-23 ENCOUNTER — Encounter (INDEPENDENT_AMBULATORY_CARE_PROVIDER_SITE_OTHER): Payer: Self-pay | Admitting: Family

## 2019-10-23 ENCOUNTER — Other Ambulatory Visit: Payer: Medicare Other | Attending: Family

## 2019-10-23 VITALS — BP 136/81 | HR 96 | Temp 98.8°F | Ht 68.0 in | Wt 170.0 lb

## 2019-10-23 DIAGNOSIS — R350 Frequency of micturition: Secondary | ICD-10-CM | POA: Insufficient documentation

## 2019-10-23 DIAGNOSIS — M549 Dorsalgia, unspecified: Secondary | ICD-10-CM

## 2019-10-23 DIAGNOSIS — N3000 Acute cystitis without hematuria: Secondary | ICD-10-CM

## 2019-10-23 MED ORDER — PHENAZOPYRIDINE 200 MG TABLET
200.00 mg | ORAL_TABLET | Freq: Three times a day (TID) | ORAL | 0 refills | Status: DC | PRN
Start: 2019-10-23 — End: 2020-09-08

## 2019-10-23 MED ORDER — CEPHALEXIN 500 MG CAPSULE
500.00 mg | ORAL_CAPSULE | Freq: Two times a day (BID) | ORAL | 0 refills | Status: AC
Start: 2019-10-23 — End: 2019-11-02

## 2019-10-23 NOTE — Nursing Note (Signed)
BP 136/81   Pulse 96   Temp 37.1 C (98.8 F) (Tympanic)   Ht 1.727 m (5\' 8" )   Wt 77.1 kg (170 lb)   SpO2 97%   BMI 25.85 kg/m     Jerl Mina, RTR  10/23/2019, 15:37

## 2019-10-23 NOTE — Nursing Note (Signed)
10/23/19 1500   Urine   QC Urine y   Time collected 1515   Glucose Negative   Bilirubin Negative   Ketones (!) Small (15 mg/dl)   Urine Specific Gravity 1.025   Blood (urine) Negative   pH 6.0   Protein Negative   Urobilinogen Normal    Nitrite Negative   Leukocytes Negative   Initials alp

## 2019-10-23 NOTE — Progress Notes (Signed)
[address], [place]  [address]  [place] [address]       Name: [name] MRN:  [id number]   Date: 10/23/2019 Age: 69 y.o.     SUBJECTIVE: [name] is a 69 y.o. female who complains of urinary frequency, urgency and dysuria x 14 days, pain feels pressure, without flank pain, fever, chills, or abnormal vaginal discharge or bleeding.    ROS:  no fevers, no headaches, no runny nose or cough, no chest pain or shortness of breath, denies vomiting or diarrhea, no weakness or changes  with ambulation, denies skin rash or lesion    Allergy History as of 10/23/19     OXYCODONE-ACETAMINOPHEN       Noted Status Severity Type Reaction    09/20/09 1055 Sowers, [place] 09/20/09 Active High Side Effect Nausea/ Vomiting          HYMENOPTERA ALLERGENIC EXTRACT       Noted Status Severity Type Reaction    09/16/15 1502 Eulas Post, MA 09/16/15 Active             PROCAINE       Noted Status Severity Type Reaction    03/31/17 1653 Isidoro Donning, RTR 11/05/13 Active    Other Adverse Reaction (Add comment)          POLYETHYLENE GLYCOL 3350       Noted Status Severity Type Reaction    11/13/17 1443 Raeford Razor, CMA 06/05/16 Active High  Hives/ Urticaria          POLYETHYLENE GLYCOL       Noted Status Severity Type Reaction    11/13/17 1443 Raeford Razor, CMA 06/05/16 Active High  Hives/ Urticaria          VENOM-WASP       Noted Status Severity Type Reaction    11/13/17 1443 Raeford Razor, [place] 09/16/15 Active                   Social History     Socioeconomic History   . Marital status: Married     Spouse name: Not on file   . Number of children: Not on file   . Years of education: Not on file   . Highest education level: Not on file   Occupational History   . 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 Smoking status: Never Smoker   . Smokeless tobacco: Never Used   Substance and Sexual Activity   . Alcohol use: No   . Drug  use: No   . Sexual activity: Yes     Partners: Male   Other Topics Concern     Social Determinants of Health     Financial Resource Strain:    . Difficulty of Paying Living Expenses:    Food Insecurity:    . Worried About Charity fundraiser in the Last Year:    . Arboriculturist in the Last Year:    Transportation Needs:    . Film/video editor (Medical):    Marland Kitchen Lack of Transportation (Non-Medical):    Physical Activity:    . Days of Exercise per Week:    . Minutes of Exercise per Session:    Stress:    . Feeling of Stress :    Intimate Partner Violence:    . Fear of Current or Ex-Partner:    .  Emotionally Abused:    Marland Kitchen Physically Abused:    . Sexually Abused:      Patient Active Problem List   Diagnosis   . Pancytopenia   . Left lower quadrant pain   . Iron deficiency   . Fatigue   . Dyspnea   . 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[facility])     Family Medical History:     Problem Relation (Age of Onset)    Asthma Mother, Sister, Brother    Colon Cancer Mother, Father    Congestive Heart Failure Mother    Diabetes Father    No Known Problems Daughter, Maternal Grandmother, Maternal Grandfather, Paternal 25, Paternal Grandfather, Son, Maternal Aunt, Maternal Uncle, Paternal 43, Paternal Uncle, Other            Past Surgical History:   Procedure Laterality Date   . HX BREAST BIOPSY Bilateral     benign    . HX CESAREAN SECTION  1993   . HX COLONOSCOPY     . HX ENDOSCOPIC SINUS SURGERY     . HX TONSILLECTOMY  1955          acetaminophen-codeine (TYLENOL #3) 300-30 mg Oral Tablet, Take 1 Tab by mouth Every 4 hours as needed (Patient not taking: Reported on 10/23/2019)  calcium-vits D3-C-K2-minerals 166.75 mg- 166.75 unit Oral Capsule, calcium  coenzyme Q10 300 mg Oral Capsule, Take 300 mg by mouth  diclofenac sodium (VOLTAREN) 50 mg Oral Tablet, Delayed Release (E.C.),   EPINEPHrine 0.3 mg/0.3 mL Injection Auto-Injector, 0.3 mL (0.3 mg total) by IntraMUSCULAR route Once, as needed  fluticasone propionate (FLONASE) 50  mcg/actuation Nasal Spray, Suspension, 1 Spray by Each Nostril route Once a day  fluticasone propionate (FLONASE) 50 mcg/actuation Nasal Spray, Suspension, 1 Spray by Each Nostril route Once a day  galcanezumab-gnlm (EMGALITY SYRINGE SUBQ), by Subcutaneous route  Glucosamine HCl 1,500 mg Oral Tablet, Take by mouth (Patient not taking: Reported on 10/23/2019)  lidocaine (XYLOCAINE) 5 % Ointment, Apply topically (Patient not taking: Reported on 10/23/2019)  linaCLOtide (LINZESS) 72 mcg Oral Capsule, Take 1 Cap (72 mcg total) by mouth Every morning (Patient not taking: Reported on 04/16/2019)  Magnesium 30 mg Oral Tablet, magnesium  magnesium oxide 400 mg Oral Tablet, Take 400 mg by mouth  MELOXICAM ORAL, Take by mouth (Patient not taking: Reported on 10/23/2019)  mupirocin (BACTROBAN) 2 % Ointment, by Apply Topically route Twice daily (Patient not taking: Reported on 10/23/2019)  OMEGA-3 FATTY ACIDS-FISH OIL ORAL, Take 1,000 mg by mouth  omega-3s/dha/epa/fish oil/D3 (VITAMIN-D + OMEGA-3 ORAL), Vitamin D  silver sulfADIAZINE (SILVADENE) 1 % Cream, by Apply Topically route Once a day (Patient not taking: Reported on 10/23/2019)  traMADoL (ULTRAM) 50 mg Oral Tablet, Take 1 Tab (50 mg total) by mouth Every 4 hours as needed for Pain (Patient not taking: Reported on 10/23/2019)  Zolmitriptan (ZOMIG) 5 mg Oral Tablet, TAKE AT ONSET OF HEADACHE, MAY REPEAT IN 2 HOURS IF NEEDED. MAX OF 2 PER DAY MAX OF 3 DAYS PER WEEK  levoFLOXacin (LEVAQUIN) 750 mg Oral Tablet, Take 1 Tab (750 mg total) by mouth Once a day (Patient not taking: Reported on 01/31/2019)  phenazopyridine (PYRIDIUM) 100 mg Oral Tablet, Take 1 Tab (100 mg total) by mouth Three times a day as needed for Pain  predniSONE (DELTASONE) 20 mg Oral Tablet, Three p.o. for 3 days, 2 p.o. for 3 days, 1 p.o. for 3 days    No facility-administered medications prior to visit.      OBJECTIVE:  BP 136/81   Pulse 96   Temp 37.1 C (98.8 F) (Tympanic)   Ht 1.727 m (5\' 8" )   Wt 77.1  kg (170 lb)   SpO2 97%   BMI 25.85 kg/m       Appears well, in no apparent distress.   General: appears in good health  Lungs: clear to auscultation bilaterally.   Skin: Skin warm and dry  Neurologic: alert and oriented x3  Psychiatric: affect normal  Abdomen: soft without significant tenderness, guarding, mass, rebound or organomegaly. No CVA tenderness noted.     Urine dipstick shows positive for ketones.      ASSESSMENT:    ICD-10-CM    1. Acute cystitis without hematuria  N30.00    2. Urinary frequency  R35.0 POCT URINE DIPSTICK   3. Back pain, unspecified back location, unspecified back pain laterality, unspecified chronicity  M54.9 POCT URINE DIPSTICK       PLAN: Keflex 500 mg BID x10d patient requested a 10 day course. - Urine Sent for culture, also push water intake, may use Pyridium  prn. Pt verbalized understanding and agrees with the plan of care. Call or return to clinic prn if these symptoms worsen or fail to improve as anticipated.    Johney Maine, APRN  10/23/2019, 15:48  Supervising physician Dr. Gerald Dexter

## 2019-10-23 NOTE — Patient Instructions (Signed)
[address] [place]  [address]  [place] [address]  Phone: [phone]  Fax: [phone]           Open Daily 8:00am - 8:00pm, except Sundays 12pm-8pm         ~ Closed Thanksgiving and Christmas Day     Attending Caregiver: Johney Maine, APRN    Today's orders:   Orders Placed This Encounter   . POCT URINE DIPSTICK   . cephalexin (KEFLEX) 500 mg Oral Capsule   . phenazopyridine (PYRIDIUM) 200 mg Oral Tablet        Prescription(s) E-Rx to:  [facility], [address]    ________________________________________________________________________  Short Term Disability and [facility] does NOT provide assistance with any disability applications.  If you feel your medical condition requires you to be on disability, you will need to follow up with  Your primary care physician or a specialist.  We apologize for any inconvenience.    For Medication Prescribed by [facility]:  As an Urgent Care facility, our clinic does NOT offer prescription refills over the telephone.    If you need more of the medication one of our medical providers prescribed, you will  Either need to be re-evaluated by [place] or see your primary care physician.    ________________________________________________________________________      It is very important that we have a phone number that is the single best way to contact you in the event that we become aware of important clinical information or concerns after your discharge.  If the phone number you provided at registration is NOT this number you should inform staff and registration prior to leaving.      Your treatment and evaluation today was focused on identifying and treating potentially emergent conditions based on your presenting signs, symptoms, and history.  The resulting initial clinical impression and treatment plan is not intended to be definitive or a substitute for a full physical  examination and evaluation by your primary care provider.  If your symptoms persist, worsen, or you develop any new or concerning symptoms, you need to be evaluated.      If you received x-rays during your visit, be aware that the final and formal interpretation of those films by a radiologist may occur after your discharge.  If there is a significant discrepancy identified after your discharge, we will contact you at the telephone number provided at registration.      If you received a pelvic exam, you may have cultures pending for sexually transmitted diseases.  Positive cultures are reported to the [place] Department of Health as required by state law.  You should be contacted if you cultures are positive.  We will not contact you if they are negative.  You did NOT receive a PAP smear (the screening test for cervical).  This specific test for women is best performed by your gynecologist or primary care provider when indicated.      If you are over 31 year old, we cannot discuss your personal health information with a parent, spouse, family member, or anyone else without your express consent.  This does not include those who have legitimate access to your records and information to assist in your care under the provisions of HIPAA ([place]) law, or those to whom you have previously given express written consent to do so, such a legal guardian  or Power of [employer].      You may have received medication that may cause you to feel drowsy and/or light headed for several hours.  You may even experience some amnesia of your stay.  You should avoid operating a motor vehicle or performing any activity requiring complete alertness or coordination until you feel fully awake (approximately 24-48 hours).  Avoid alcoholic beverages.  You may also have a dry mouth for several hours.  This is a normal side effect and will disappear as the effects of the medication wear off.       Instructions discussed with patient upon discharge by clinical staff with all questions answered.  Please call [facility] [phone] if any further questions.  Go immediately to the emergency department if any concern or worsening symptoms.    Johney Maine, APRN 10/23/2019, 16:34

## 2019-10-25 NOTE — Progress Notes (Signed)
Please let patient know that urine culture grew less than 10,000 of an organism which is not considered a UTI.  If symptoms have not improved on Keflex please discontinue.  Go to ER for any worsening.  Windy Kalata, NP

## 2019-11-07 ENCOUNTER — Other Ambulatory Visit: Payer: Self-pay

## 2019-11-07 ENCOUNTER — Encounter (INDEPENDENT_AMBULATORY_CARE_PROVIDER_SITE_OTHER): Payer: Self-pay | Admitting: Family Medicine

## 2019-11-07 ENCOUNTER — Ambulatory Visit (INDEPENDENT_AMBULATORY_CARE_PROVIDER_SITE_OTHER): Payer: Medicare Other | Admitting: Family Medicine

## 2019-11-07 VITALS — BP 122/76 | HR 72 | Temp 97.8°F | Resp 18 | Ht 68.0 in | Wt 173.0 lb

## 2019-11-07 DIAGNOSIS — N819 Female genital prolapse, unspecified: Secondary | ICD-10-CM

## 2019-11-07 DIAGNOSIS — M545 Low back pain, unspecified: Secondary | ICD-10-CM

## 2019-11-07 DIAGNOSIS — J309 Allergic rhinitis, unspecified: Secondary | ICD-10-CM

## 2019-11-07 DIAGNOSIS — Z91030 Bee allergy status: Secondary | ICD-10-CM

## 2019-11-07 DIAGNOSIS — Z1589 Genetic susceptibility to other disease: Secondary | ICD-10-CM

## 2019-11-07 DIAGNOSIS — E7212 Methylenetetrahydrofolate reductase deficiency: Secondary | ICD-10-CM

## 2019-11-07 DIAGNOSIS — M546 Pain in thoracic spine: Secondary | ICD-10-CM

## 2019-11-07 DIAGNOSIS — Z9109 Other allergy status, other than to drugs and biological substances: Secondary | ICD-10-CM

## 2019-11-07 DIAGNOSIS — Z7722 Contact with and (suspected) exposure to environmental tobacco smoke (acute) (chronic): Secondary | ICD-10-CM

## 2019-11-07 MED ORDER — EPINEPHRINE 0.3 MG/0.3 ML INJECTION, AUTO-INJECTOR
0.30 mg | AUTO-INJECTOR | Freq: Once | INTRAMUSCULAR | 0 refills | Status: DC | PRN
Start: 2019-11-07 — End: 2020-09-08

## 2019-11-07 MED ORDER — FLUTICASONE PROPIONATE 50 MCG/ACTUATION NASAL SPRAY,SUSPENSION
1.0000 | Freq: Every day | NASAL | 4 refills | Status: DC
Start: 2019-11-07 — End: 2020-09-08

## 2019-11-07 MED ORDER — LINACLOTIDE 145 MCG CAPSULE
145.0000 ug | ORAL_CAPSULE | Freq: Every morning | ORAL | 3 refills | Status: DC
Start: 2019-11-07 — End: 2020-11-03

## 2019-11-07 MED ORDER — DICLOFENAC 50 MG-MISOPROSTOL 200 MCG TABLET,IMMED.AND DELAYED RELEASE
1.00 | DELAYED_RELEASE_TABLET | Freq: Two times a day (BID) | ORAL | 5 refills | Status: DC
Start: 2019-11-07 — End: 2020-04-28

## 2019-11-07 NOTE — Nursing Note (Signed)
Chief Complaint:   Chief Complaint            Referrals     Medication Check         Functional Health Screen        Vital Signs  BP 122/76   Pulse 72   Temp 36.6 C (97.8 F) (Oral)   Resp 18   Ht 1.727 m (5\' 8" )   Wt 78.5 kg (173 lb)   BMI 26.30 kg/m       Social History     Tobacco Use   Smoking Status Never Smoker   Smokeless Tobacco Never Used     Allergies  Allergies   Allergen Reactions   . Percocet [Oxycodone-Acetaminophen] Nausea/ Vomiting   . Polyethylene Glycol Hives/ Urticaria   . Polyethylene Glycol 3350 Hives/ Urticaria   . Beesting [Hymenoptera Allergenic Extract]    . Procaine  Other Adverse Reaction (Add comment)   . Venom-Wasp      Medication History  Reviewed for OTC medication and any new medications, provider will review medication history  Care Team  Patient Care Team:  [name], MD as PCP - General ([place])  Britt Bottom, MD (Inactive) ([facility])  Immunizations - last 24 hours     None        Elsie Ra, [place]  11/07/2019, 08:44

## 2019-11-07 NOTE — Progress Notes (Signed)
Outpatient Visit Note (Assment/Plan;Subjective;Objective)     Patient: [name] Provider: [name], MD   Date of Birth: [birth year] Date of Visit 11/07/2019      Assessment: Plan:       ICD-10-CM    1. Female genital prolapse, unspecified type  N81.9 Refer to External Provider-[name]   2. 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HCC)   asymptomatic E72.12    3. Bee sting allergy  Z91.030 EPINEPHrine 0.3 mg/0.3 mL Injection Auto-Injector   4. Environmental allergies  stable  Z91.09 fluticasone propionate (FLONASE) 50 mcg/actuation Nasal Spray, Suspension   5. Thoracic back pain   Improves with physical therapy.  Declines referral to pain management M54.6 PHYSICAL THERAPY (EVALUATE AND TREAT)   6. Lumbar pain  M54.5 PHYSICAL THERAPY (EVALUATE AND TREAT)       Orders Placed This Encounter   . Refer to External Provider   . PHYSICAL THERAPY (EVALUATE AND TREAT)   . EPINEPHrine 0.3 mg/0.3 mL Injection Auto-Injector   . fluticasone propionate (FLONASE) 50 mcg/actuation Nasal Spray, Suspension   . Diclofenac-Misoprostol 50-200 mg-mcg Oral Tab,IR & Delay Rel,Multiphasic   . linaCLOtide (LINZESS) 145 mcg Oral Capsule     Current Outpatient Medications   Medication Sig   . acetaminophen-codeine (TYLENOL #3) 300-30 mg Oral Tablet Take 1 Tab by mouth Every 4 hours as needed (Patient not taking: Reported on 10/23/2019)   . calcium-vits D3-C-K2-minerals 166.75 mg- 166.75 unit Oral Capsule calcium   . coenzyme Q10 300 mg Oral Capsule Take 300 mg by mouth   . Diclofenac-Misoprostol 50-200 mg-mcg Oral Tab,IR & Delay Rel,Multiphasic Take 1 Tablet by mouth Twice daily   . EPINEPHrine 0.3 mg/0.3 mL Injection Auto-Injector 0.3 mL (0.3 mg total) by IntraMUSCULAR route Once, as needed   . fluticasone propionate (FLONASE) 50 mcg/actuation Nasal Spray, Suspension 1 Spray by Each Nostril route Once a day   . galcanezumab-gnlm (EMGALITY SYRINGE SUBQ) by Subcutaneous route   . Glucosamine HCl 1,500 mg Oral Tablet Take by mouth (Patient not  taking: Reported on 10/23/2019)   . lidocaine (XYLOCAINE) 5 % Ointment Apply topically (Patient not taking: Reported on 10/23/2019)   . linaCLOtide (LINZESS) 145 mcg Oral Capsule Take 1 Capsule (145 mcg total) by mouth Every morning   . Magnesium 30 mg Oral Tablet magnesium   . magnesium oxide 400 mg Oral Tablet Take 400 mg by mouth   . mupirocin (BACTROBAN) 2 % Ointment by Apply Topically route Twice daily (Patient not taking: Reported on 10/23/2019)   . OMEGA-3 FATTY ACIDS-FISH OIL ORAL Take 1,000 mg by mouth   . omega-3s/dha/epa/fish oil/D3 (VITAMIN-D + OMEGA-3 ORAL) Vitamin D   . phenazopyridine (PYRIDIUM) 200 mg Oral Tablet Take 1 Tablet (200 mg total) by mouth Three times a day as needed for Pain for up to 9 doses   . silver sulfADIAZINE (SILVADENE) 1 % Cream by Apply Topically route Once a day (Patient not taking: Reported on 10/23/2019)   . traMADoL (ULTRAM) 50 mg Oral Tablet Take 1 Tab (50 mg total) by mouth Every 4 hours as needed for Pain (Patient not taking: Reported on 10/23/2019)   . Zolmitriptan (ZOMIG) 5 mg Oral Tablet TAKE AT ONSET OF HEADACHE, MAY REPEAT IN 2 HOURS IF NEEDED. MAX OF 2 PER DAY MAX OF 3 DAYS PER WEEK           follow up in prn    [id number]    Subjective:   CC:   Chief Complaint   Patient presents with   .  Referrals   . Medication Check     [name] is a 69 y.o. female here for f/u    Patient states that for the past couple of months she has felt an organ protrude from her vulva.  She has been able to push it back and it does stay.  She denies any vaginal bleeding, discharge or pain.  She would like to be referred to Gynecology.  Her gynecological exam was a Pap test 5 years ago which was normal.  She continues to have difficulty urinating and states that her stream has changed.  She also has persistent incontinence.  She feels bloated and continued to have alternating bowel movements.  She states that Linzess 72 mg is not effective.  She reports she does not have good bowel  movements unless she takes a stool softener.  She denies any rectal bleeding.  She also reports mid to lower back pain.  There has been no weakness or numbness of her legs.  She states her allergies are currently stable and needs a refill of her EpiPen due to allergic reaction to bee stings.  Other medical issues include:  Patient Active Problem List   Diagnosis   . Pancytopenia   . Left lower quadrant pain   . Iron deficiency   . Fatigue   . Dyspnea   . 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HCC)     No changes in [name]    Past Medical History:   Diagnosis Date   . Breast lump 2005    Resolved prior to biopsy   . Detached retina    . Esophageal reflux    . Hx of migraines    . Iron deficiency anemia    . LLQ abdominal pain    . Migraine    . Osteoporosis    . Pancytopenia    . Prolapsed uterus          Past Surgical History:   Procedure Laterality Date   . HX BREAST BIOPSY Bilateral     benign    . HX CESAREAN SECTION  1993   . HX COLONOSCOPY     . HX ENDOSCOPIC SINUS SURGERY     . HX TONSILLECTOMY  1955         Family Medical History:     Problem Relation (Age of Onset)    Asthma Mother, Sister, Brother    Colon Cancer Mother, Father    Congestive Heart Failure Mother    Diabetes Father    No Known Problems Daughter, Maternal Grandmother, Maternal Grandfather, Paternal 75, Paternal Grandfather, Son, Maternal Aunt, Maternal Uncle, Paternal 76, Paternal Uncle, Other            Social History     Socioeconomic History   . Marital status: Married     Spouse name: Not on file   . Number of children: Not on file   . Years of education: Not on file   . Highest education level: Not on file   Occupational History   . 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 Smoking status: Never Smoker   . Smokeless tobacco: Never Used   Substance and Sexual Activity   . Alcohol use: No   . Drug use: No   . Sexual activity: Yes     Partners: Male   Other Topics Concern     Social  Determinants of  Health     Financial Resource Strain:    . Difficulty of Paying Living Expenses:    Food Insecurity:    . Worried About Charity fundraiser in the Last Year:    . Arboriculturist in the Last Year:    Transportation Needs:    . Film/video editor (Medical):    Marland Kitchen Lack of Transportation (Non-Medical):    Physical Activity:    . Days of Exercise per Week:    . Minutes of Exercise per Session:    Stress:    . Feeling of Stress :    Intimate Partner Violence:    . Fear of Current or Ex-Partner:    . Emotionally Abused:    Marland Kitchen Physically Abused:    . Sexually Abused:      Allergies   Allergen Reactions   . Percocet [Oxycodone-Acetaminophen] Nausea/ Vomiting   . Polyethylene Glycol Hives/ Urticaria   . Polyethylene Glycol 3350 Hives/ Urticaria   . Beesting [Hymenoptera Allergenic Extract]    . Procaine  Other Adverse Reaction (Add comment)   . Venom-Wasp      Medications reviewed  Review of Systems: Other than ROS in the HPI, all other systems were negative.   Outpatient Medications Prior to Visit   Medication Sig Dispense Refill   . acetaminophen-codeine (TYLENOL #3) 300-30 mg Oral Tablet Take 1 Tab by mouth Every 4 hours as needed (Patient not taking: Reported on 10/23/2019) 30 Tab 0   . calcium-vits D3-C-K2-minerals 166.75 mg- 166.75 unit Oral Capsule calcium     . coenzyme Q10 300 mg Oral Capsule Take 300 mg by mouth     . galcanezumab-gnlm (EMGALITY SYRINGE SUBQ) by Subcutaneous route     . Glucosamine HCl 1,500 mg Oral Tablet Take by mouth (Patient not taking: Reported on 10/23/2019)     . lidocaine (XYLOCAINE) 5 % Ointment Apply topically (Patient not taking: Reported on 10/23/2019)     . Magnesium 30 mg Oral Tablet magnesium     . magnesium oxide 400 mg Oral Tablet Take 400 mg by mouth     . mupirocin (BACTROBAN) 2 % Ointment by Apply Topically route Twice daily (Patient not taking: Reported on 10/23/2019) 30 g 0   . OMEGA-3 FATTY ACIDS-FISH OIL ORAL Take 1,000 mg by mouth     . omega-3s/dha/epa/fish  oil/D3 (VITAMIN-D + OMEGA-3 ORAL) Vitamin D     . phenazopyridine (PYRIDIUM) 200 mg Oral Tablet Take 1 Tablet (200 mg total) by mouth Three times a day as needed for Pain for up to 9 doses 9 Tablet 0   . silver sulfADIAZINE (SILVADENE) 1 % Cream by Apply Topically route Once a day (Patient not taking: Reported on 10/23/2019) 85 g 0   . traMADoL (ULTRAM) 50 mg Oral Tablet Take 1 Tab (50 mg total) by mouth Every 4 hours as needed for Pain (Patient not taking: Reported on 10/23/2019) 60 Tab 3   . Zolmitriptan (ZOMIG) 5 mg Oral Tablet TAKE AT ONSET OF HEADACHE, MAY REPEAT IN 2 HOURS IF NEEDED. MAX OF 2 PER DAY MAX OF 3 DAYS PER WEEK 9 Tab 11   . diclofenac sodium (VOLTAREN) 50 mg Oral Tablet, Delayed Release (E.C.)      . EPINEPHrine 0.3 mg/0.3 mL Injection Auto-Injector 0.3 mL (0.3 mg total) by IntraMUSCULAR route Once, as needed 2 Each 0   . fluticasone propionate (FLONASE) 50 mcg/actuation Nasal Spray, Suspension 1 Spray by Each Nostril route Once a day 16  g 0   . fluticasone propionate (FLONASE) 50 mcg/actuation Nasal Spray, Suspension 1 Spray by Each Nostril route Once a day 1 Bottle 5   . linaCLOtide (LINZESS) 72 mcg Oral Capsule Take 1 Cap (72 mcg total) by mouth Every morning (Patient not taking: Reported on 04/16/2019) 30 Cap 3   . MELOXICAM ORAL Take by mouth (Patient not taking: Reported on 10/23/2019)       No facility-administered medications prior to visit.     Objective:     BP 122/76   Pulse 72   Temp 36.6 C (97.8 F) (Oral)   Resp 18   Ht 1.727 m (5\' 8" )   Wt 78.5 kg (173 lb)   BMI 26.30 kg/m      Body mass index is 26.3 kg/m.    No LMP recorded. Patient is postmenopausal.  GEN: NAD  LUNGS: CTA  B, anteriorly and posteriorly  HRT: RRR, neg murmur  ABD: normal bowel sounds, soft, mild low abdominal tenderness to deep palp, no rebound or guarding,nondistended, no hepatosplenomegaly  EXT: No edema    Labs:   No visits with results within 1 Month(s) from this visit.   Latest known visit with results  is:   Appointment on 03/07/2019   Component Date Value Ref Range Status   . WBC 03/07/2019 4.5  4.0 - 11.0 x10^3/uL Final   . RBC 03/07/2019 3.95* 4.00 - 5.10 x10^6/uL Final   . HGB 03/07/2019 12.8  12.0 - 15.5 g/dL Final   . HCT 03/07/2019 38.5  36.0 - 45.0 % Final   . MCV 03/07/2019 97.6* 82.0 - 97.0 fL Final   . MCH 03/07/2019 32.5  27.5 - 33.2 pg Final   . MCHC 03/07/2019 33.3  32.0 - 36.0 g/dL Final   . RDW 03/07/2019 13.7  11.0 - 16.0 % Final   . PLATELETS 03/07/2019 170  150 - 450 x10^3/uL Final   . MPV 03/07/2019 9.9  7.4 - 10.5 fL Final   . NEUTROPHIL % 03/07/2019 62  43 - 76 % Final   . LYMPHOCYTE % 03/07/2019 24  15 - 43 % Final   . MONOCYTE % 03/07/2019 10  5 - 12 % Final   . EOSINOPHIL % 03/07/2019 3  0 - 5 % Final   . BASOPHIL % 03/07/2019 1  0 - 3 % Final   . NEUTROPHIL # 03/07/2019 2.80  1.50 - 6.50 x10^3/uL Final   . LYMPHOCYTE # 03/07/2019 1.10  1.00 - 4.80 x10^3/uL Final   . MONOCYTE # 03/07/2019 0.50  0.20 - 0.90 x10^3/uL Final   . EOSINOPHIL # 03/07/2019 0.10  0.00 - 0.50 x10^3/uL Final   . BASOPHIL # 03/07/2019 0.00  0.00 - 0.10 x10^3/uL Final   . FERRITIN 03/07/2019 21  11 - 307 ng/mL Final   . TOTAL IRON BINDING CAPACITY 03/07/2019 319  ug/dL Final   . IRON (TRANSFERRIN) SATURATION 03/07/2019 25  15 - 50 % Final   . IRON 03/07/2019 79  28 - 170 ug/dL Final   . TRANSFERRIN 03/07/2019 223  192 - 282 mg/dL Final   . SODIUM 03/07/2019 139  136 - 145 mmol/L Final   . POTASSIUM 03/07/2019 4.1  3.4 - 5.1 mmol/L Final   . CHLORIDE 03/07/2019 102  101 - 111 mmol/L Final   . CO2 TOTAL 03/07/2019 27  22 - 32 mmol/L Final   . ANION GAP 03/07/2019 10  3 - 11 mmol/L Final   . BUN 03/07/2019 20  6 - 20 mg/dL Final   . CREATININE 03/07/2019 0.66  0.44 - 1.00 mg/dL Final   . BUN/CREA RATIO 03/07/2019 30* 6 - 22 Final   . ESTIMATED GFR 03/07/2019 >60  >60 mL/min/1.107m^2 Final   . ALBUMIN 03/07/2019 3.7  3.5 - 5.0 g/dL Final   . CALCIUM 03/07/2019 9.3  8.8 - 10.2 mg/dL Final   . GLUCOSE 03/07/2019 93  70 - 110  mg/dL Final   . ALKALINE PHOSPHATASE 03/07/2019 45  38 - 126 U/L Final   . ALT (SGPT) 03/07/2019 31  14 - 54 U/L Final   . AST (SGOT) 03/07/2019 39  15 - 41 U/L Final   . BILIRUBIN TOTAL 03/07/2019 0.7  0.3 - 1.2 mg/dL Final   . PROTEIN TOTAL 03/07/2019 6.2* 6.4 - 8.3 g/dL Final   . ALBUMIN/GLOBULIN RATIO 03/07/2019 1.5  0.8 - 2.0 Final   . TSH 03/07/2019 1.467  0.340 - 5.330 uIU/mL Final   . ESTIMATED AVERAGE GLUCOSE 03/07/2019 108  mg/dL Final   . HEMOGLOBIN A1C 03/07/2019 5.4  4.0 - 5.6 % Final         Nicole Baron, MD  [place] [place]  [address]  [place] [address]  Dept: [phone]  Dept Fax: [phone]  Loc: [place] Fax: [phone]

## 2019-11-13 ENCOUNTER — Encounter (INDEPENDENT_AMBULATORY_CARE_PROVIDER_SITE_OTHER): Payer: Self-pay

## 2019-11-18 ENCOUNTER — Encounter (INDEPENDENT_AMBULATORY_CARE_PROVIDER_SITE_OTHER): Payer: Self-pay | Admitting: Urology

## 2019-11-18 ENCOUNTER — Ambulatory Visit (INDEPENDENT_AMBULATORY_CARE_PROVIDER_SITE_OTHER): Payer: Medicare Other | Admitting: Urology

## 2019-11-18 ENCOUNTER — Other Ambulatory Visit: Payer: Medicare Other | Attending: Urology

## 2019-11-18 ENCOUNTER — Other Ambulatory Visit: Payer: Self-pay

## 2019-11-18 VITALS — BP 116/74 | HR 67 | Temp 97.1°F | Ht 68.0 in | Wt 179.0 lb

## 2019-11-18 DIAGNOSIS — N1330 Unspecified hydronephrosis: Secondary | ICD-10-CM | POA: Insufficient documentation

## 2019-11-18 DIAGNOSIS — R300 Dysuria: Secondary | ICD-10-CM

## 2019-11-18 DIAGNOSIS — N814 Uterovaginal prolapse, unspecified: Secondary | ICD-10-CM

## 2019-11-18 DIAGNOSIS — R350 Frequency of micturition: Secondary | ICD-10-CM

## 2019-11-18 DIAGNOSIS — N3090 Cystitis, unspecified without hematuria: Secondary | ICD-10-CM

## 2019-11-18 DIAGNOSIS — R109 Unspecified abdominal pain: Secondary | ICD-10-CM

## 2019-11-18 NOTE — Nursing Note (Signed)
11/18/19 1000   Required: Location Test Performed At:   [address] [place], [place] [address]   Urine test  (Siemens Multistix 10 SG)   Time collected 1026   Color Yellow   Clarity Cloudy   Glucose Negative   Bilirubin Negative   Ketones Negative   Urine Specific Gravity 1.015   Blood (urine) Negative   pH 7.5   Protein Negative   Urobilinogen 0.2mg /dL (Normal)   Nitrite Negative   Leukocytes Trace   Performed Status Automated   Bottle Number   (Siemens Multistix 10 SG) [id number]   Lot # [id number]   Expiration Date 09/02/20   Initials mr

## 2019-11-18 NOTE — Nursing Note (Signed)
11/18/19 1000   Required: Location Test Performed At:   [address] [place], [place] [address]   Urine test  (Siemens Multistix 10 SG)   Time collected 1026   Color Yellow   Clarity Cloudy   Glucose Negative   Bilirubin Negative   Ketones Negative   Urine Specific Gravity 1.015   Blood (urine) Negative   pH 7.5   Protein Negative   Urobilinogen 0.2mg /dL (Normal)   Nitrite Negative   Leukocytes Trace   PVR Volume 31mL   Performed Status Automated   Bottle Number   (Siemens Multistix 10 SG) [id number]   Lot # [id number]   Expiration Date 09/02/20   Initials mr

## 2019-11-18 NOTE — Progress Notes (Cosign Needed)
UROLOGY  PROGRESS NOTE    [name]  Date of service: 11/18/2019  Date of Birth:  1951-01-09  MRN:  [id number]    SUBJECTIVE:    Chief Complaint:   Chief Complaint   Patient presents with   . Pyelonephritis   . Urinary Frequency   . Flank Pain       History of Present Illness:    [name] 69 y.o. female presents for evaluation of Pyelonephritis, urinary frequency, and flank pain. The patient c/o back pain in and above her kidneys. The ER prescribed Keflex for 9 days. Urine culture was negative.     She states she was having intermittent diarrhea and constipation over the last year and has been seeing a gastroenterologist. She was treated for intestinal bacteria overgrowth.    The patient states she has a uterine prolapse. She has constant pain in her lower abdomen (ache). She's needed adult urinary pads and has been having bladder leakage.    ROS:  Negative, except as per HPI.    OBJECTIVE:    Vital Signs:  Temperature: 36.2 C (97.1 F) (11/18/19 1017)  BP (Non-Invasive): 116/74 (11/18/19 1017)  Heart Rate: 67 (11/18/19 1017)    Physical examination:  Constitutional: Appears well, no acute distress.       Procedure:  No notes on file    Data Reviewed: N/A    Urine Dip Results:  Time collected: 1026  Glucose: Negative  Bilirubin: Negative  Ketones: Negative  Urine Specific Gravity: 1.015  Blood (urine): Negative  pH: 7.5  Protein: Negative  Urobilinogen: 0.2mg /dL (Normal)  Nitrite: Negative  Leukocytes: Trace    PVR Volume: 34mL      Assessment and Plan:      ICD-10-CM    1. Cystitis  N30.90 POCT URINE DIPSTICK     POCT PVR   2. Bilateral hydronephrosis  N13.30 NUC RENAL SCAN W/ LASIX     URINE CULTURE     POCT PVR   3. Dysuria  R30.0 URINE CULTURE     POCT PVR       Orders Placed This Encounter   . URINE CULTURE   . NUC RENAL SCAN W/ LASIX   . POCT URINE DIPSTICK   . POCT PVR     - Referred to  Urogynecologist for follow up for uterine prolapse  - Imaging ordered to evaluate for kidney blockage  - Urine culture ordered      Follow up after imaging.    Dr. Fulton Mole. Karle Barr, [facility]    I am in the presence of and scribing for Dr. Fulton Mole. Karle Barr, MD, for services provided on 11/18/2019.  [place], SCRIBE  11/18/2019, 10:54    [id number]

## 2019-11-19 ENCOUNTER — Ambulatory Visit (HOSPITAL_BASED_OUTPATIENT_CLINIC_OR_DEPARTMENT_OTHER): Payer: Medicare Other

## 2019-11-19 ENCOUNTER — Encounter (HOSPITAL_BASED_OUTPATIENT_CLINIC_OR_DEPARTMENT_OTHER): Payer: Self-pay

## 2019-11-21 LAB — URINE CULTURE: URINE CULTURE: 60000 — AB

## 2019-11-24 ENCOUNTER — Telehealth (INDEPENDENT_AMBULATORY_CARE_PROVIDER_SITE_OTHER): Payer: Self-pay | Admitting: Urology

## 2019-11-24 NOTE — Telephone Encounter (Addendum)
Positive urine culture.  Message routed to Dr. Karle Barr for treatment plan.Galen Manila, LPN  9/62/9528, 41:32      Reviewed with Dr. Karle Barr.  He is recommending Levaquin 250 mg daily x 5 days.  I l/m on patients v/m requesting call back.Galen Manila, LPN  4/40/1027, 25:36      Patient notified.Galen Manila, LPN  6/44/0347, 42:59    Patient had Nuc Renal Scan today.  Results currently not available for review.  I will check for results tomorrow and then have Dr. Karle Barr review.Galen Manila, LPN  5/63/8756, 43:32    Patient reviewed results in [place].Galen Manila, LPN  9/51/8841, 66:06    IMPRESSION:  1. Normal nuclear renal scan of the kidneys bilaterally without evidence  for obstruction.    Nuc renal scan results reviewed with Dr. Karle Barr.  Per Dr. Karle Barr, everything looks good from a urology stand point. Patient notified.Galen Manila, LPN  08/03/6008, 93:23

## 2019-11-25 ENCOUNTER — Other Ambulatory Visit (INDEPENDENT_AMBULATORY_CARE_PROVIDER_SITE_OTHER): Payer: Self-pay | Admitting: Urology

## 2019-11-25 ENCOUNTER — Other Ambulatory Visit: Payer: Self-pay

## 2019-11-25 ENCOUNTER — Ambulatory Visit
Admission: RE | Admit: 2019-11-25 | Discharge: 2019-11-25 | Disposition: A | Discharge status: Home / Self Care | Payer: Medicare Other | Source: Ambulatory Visit | Attending: Urology | Admitting: Urology

## 2019-11-25 DIAGNOSIS — N1330 Unspecified hydronephrosis: Secondary | ICD-10-CM | POA: Insufficient documentation

## 2019-11-25 MED ORDER — FUROSEMIDE 10 MG/ML INJECTION SOLUTION
24.0000 mg | Freq: Once | INTRAMUSCULAR | Status: DC
Start: 2019-11-25 — End: 2019-11-26
  Filled 2019-11-25: qty 4

## 2019-11-25 MED ORDER — LEVOFLOXACIN 250 MG TABLET
250.0000 mg | ORAL_TABLET | Freq: Every day | ORAL | 0 refills | Status: AC
Start: 2019-11-25 — End: 2019-11-30

## 2019-11-25 NOTE — Telephone Encounter (Signed)
Positive urine culture.  Message routed to Dr. Karle Barr for treatment plan.Galen Manila, LPN  4/62/7035, 00:93      Reviewed with Dr. Karle Barr.  He is recommending Levaquin 250mg  daily x 5 days.  I l/m on patients v/m requesting call back.Galen Manila, LPN  01/21/2992, 71:69    Order placed and routed to provider for sign off.Galen Manila, LPN  6/78/9381, 01:75

## 2019-12-13 ENCOUNTER — Encounter (INDEPENDENT_AMBULATORY_CARE_PROVIDER_SITE_OTHER): Payer: Self-pay | Admitting: Urology

## 2019-12-13 NOTE — Addendum Note (Signed)
Addended by: Marthe Patch on: 12/13/2019 08:40 AM     Modules accepted: Level of Service

## 2020-04-28 ENCOUNTER — Other Ambulatory Visit (INDEPENDENT_AMBULATORY_CARE_PROVIDER_SITE_OTHER): Payer: Self-pay | Admitting: Family Medicine

## 2020-05-03 MED ORDER — DICLOFENAC 50 MG-MISOPROSTOL 200 MCG TABLET,IMMED.AND DELAYED RELEASE
1.00 | DELAYED_RELEASE_TABLET | Freq: Two times a day (BID) | ORAL | 5 refills | Status: DC
Start: 2020-05-03 — End: 2020-07-14

## 2020-06-30 ENCOUNTER — Emergency Department: Payer: Medicare PPO

## 2020-06-30 ENCOUNTER — Observation Stay
Admission: EM | Admit: 2020-06-30 | Discharge: 2020-07-02 | Disposition: A | Discharge status: Home / Self Care | Payer: Medicare PPO | Attending: Surgery | Admitting: Surgery

## 2020-06-30 DIAGNOSIS — S27321A Contusion of lung, unilateral, initial encounter: Secondary | ICD-10-CM | POA: Diagnosis present

## 2020-06-30 DIAGNOSIS — S37032A Laceration of left kidney, unspecified degree, initial encounter: Secondary | ICD-10-CM | POA: Diagnosis present

## 2020-06-30 DIAGNOSIS — S36222A Contusion of tail of pancreas, initial encounter: Secondary | ICD-10-CM | POA: Diagnosis present

## 2020-06-30 DIAGNOSIS — R519 Headache, unspecified: Secondary | ICD-10-CM | POA: Insufficient documentation

## 2020-06-30 DIAGNOSIS — S2222XA Fracture of body of sternum, initial encounter for closed fracture: Principal | ICD-10-CM | POA: Diagnosis present

## 2020-06-30 DIAGNOSIS — Z885 Allergy status to narcotic agent status: Secondary | ICD-10-CM | POA: Insufficient documentation

## 2020-06-30 DIAGNOSIS — Z86010 Personal history of colonic polyps: Secondary | ICD-10-CM | POA: Insufficient documentation

## 2020-06-30 DIAGNOSIS — G319 Degenerative disease of nervous system, unspecified: Secondary | ICD-10-CM | POA: Insufficient documentation

## 2020-06-30 DIAGNOSIS — Y92410 Unspecified street and highway as the place of occurrence of the external cause: Secondary | ICD-10-CM | POA: Insufficient documentation

## 2020-06-30 DIAGNOSIS — M542 Cervicalgia: Secondary | ICD-10-CM | POA: Insufficient documentation

## 2020-06-30 DIAGNOSIS — K219 Gastro-esophageal reflux disease without esophagitis: Secondary | ICD-10-CM | POA: Insufficient documentation

## 2020-06-30 DIAGNOSIS — Z79899 Other long term (current) drug therapy: Secondary | ICD-10-CM | POA: Insufficient documentation

## 2020-06-30 DIAGNOSIS — I959 Hypotension, unspecified: Secondary | ICD-10-CM | POA: Insufficient documentation

## 2020-06-30 LAB — VH I-STAT CHEM 8 NOTIFICATION

## 2020-06-30 LAB — I-STAT CHEM 8 CARTRIDGE
Anion Gap I-Stat: 14 (ref 7.0–16.0)
BUN I-Stat: 20 mg/dL (ref 7–22)
Calcium Ionized I-Stat: 4.7 mg/dL (ref 4.35–5.10)
Chloride I-Stat: 105 mMol/L (ref 98–110)
Creatinine I-Stat: 0.6 mg/dL (ref 0.60–1.20)
EGFR: 92 mL/min/{1.73_m2} (ref 60–150)
Glucose I-Stat: 83 mg/dL (ref 71–99)
Hematocrit I-Stat: 36 % (ref 36.0–48.0)
Hemoglobin I-Stat: 12.2 gm/dL (ref 12.0–16.0)
Potassium I-Stat: 3.8 mMol/L (ref 3.5–5.3)
Sodium I-Stat: 140 mMol/L (ref 136–147)
TCO2 I-Stat: 26 mMol/L (ref 24–29)

## 2020-06-30 LAB — CBC AND DIFFERENTIAL
Basophils %: 0.1 % (ref 0.0–3.0)
Basophils Absolute: 0 10*3/uL (ref 0.0–0.3)
Eosinophils %: 1.4 % (ref 0.0–7.0)
Eosinophils Absolute: 0.1 10*3/uL (ref 0.0–0.8)
Hematocrit: 34.1 % — ABNORMAL LOW (ref 36.0–48.0)
Hemoglobin: 11.1 gm/dL — ABNORMAL LOW (ref 12.0–16.0)
Lymphocytes Absolute: 0.6 10*3/uL (ref 0.6–5.1)
Lymphocytes: 7.9 % — ABNORMAL LOW (ref 15.0–46.0)
MCH: 32 pg (ref 28–35)
MCHC: 33 gm/dL (ref 32–36)
MCV: 98 fL (ref 80–100)
MPV: 9 fL (ref 6.0–10.0)
Monocytes Absolute: 0.4 10*3/uL (ref 0.1–1.7)
Monocytes: 5.1 % (ref 3.0–15.0)
Neutrophils %: 85.4 % — ABNORMAL HIGH (ref 42.0–78.0)
Neutrophils Absolute: 6 10*3/uL (ref 1.7–8.6)
PLT CT: 163 10*3/uL (ref 130–440)
RBC: 3.48 10*6/uL — ABNORMAL LOW (ref 3.80–5.00)
RDW: 12.6 % (ref 11.0–14.0)
WBC: 7 10*3/uL (ref 4.0–11.0)

## 2020-06-30 MED ORDER — ONDANSETRON HCL 4 MG/2ML IJ SOLN
INTRAMUSCULAR | Status: AC
Start: 2020-06-30 — End: ?
  Filled 2020-06-30: qty 2

## 2020-06-30 MED ORDER — MORPHINE SULFATE 4 MG/ML IJ/IV SOLN (WRAP)
2.0000 mg | Freq: Once | Status: AC
Start: 2020-06-30 — End: 2020-06-30
  Administered 2020-06-30: 2 mg via INTRAVENOUS

## 2020-06-30 MED ORDER — IOHEXOL 350 MG/ML IV SOLN
100.0000 mL | Freq: Once | INTRAVENOUS | Status: DC | PRN
Start: 2020-06-30 — End: 2020-07-01

## 2020-06-30 MED ORDER — MORPHINE SULFATE 4 MG/ML IJ/IV SOLN (WRAP)
Status: AC
Start: 2020-06-30 — End: ?
  Filled 2020-06-30: qty 1

## 2020-06-30 MED ORDER — ONDANSETRON HCL 4 MG/2ML IJ SOLN
4.00 mg | Freq: Once | INTRAMUSCULAR | Status: AC
Start: 2020-06-30 — End: 2020-06-30
  Administered 2020-06-30: 4 mg via INTRAVENOUS
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
## 2020-07-01 ENCOUNTER — Encounter: Payer: Self-pay | Admitting: Surgery

## 2020-07-01 DIAGNOSIS — S37032A Laceration of left kidney, unspecified degree, initial encounter: Secondary | ICD-10-CM | POA: Diagnosis present

## 2020-07-01 DIAGNOSIS — S36222A Contusion of tail of pancreas, initial encounter: Secondary | ICD-10-CM | POA: Diagnosis present

## 2020-07-01 LAB — VH DEXTROSE STICK GLUCOSE: Glucose POCT: 82 mg/dL (ref 71–99)

## 2020-07-01 LAB — HEMOGLOBIN AND HEMATOCRIT, BLOOD
Hematocrit: 30.8 % — ABNORMAL LOW (ref 36.0–48.0)
Hematocrit: 30.7 % — ABNORMAL LOW (ref 36.0–48.0)
Hemoglobin: 10.2 gm/dL — ABNORMAL LOW (ref 12.0–16.0)
Hemoglobin: 10 gm/dL — ABNORMAL LOW (ref 12.0–16.0)

## 2020-07-01 MED ORDER — SODIUM CHLORIDE (PF) 0.9 % IJ SOLN
0.4000 mg | INTRAMUSCULAR | Status: DC | PRN
Start: 2020-07-01 — End: 2020-07-02

## 2020-07-01 MED ORDER — VH FISH OIL 1000 MG PO (WRAP)
1000.00 mg | ORAL_CAPSULE | Freq: Every day | ORAL | Status: DC
Start: 2020-07-01 — End: 2020-07-02
  Administered 2020-07-01 – 2020-07-02 (×2): 1000 mg via ORAL
  Filled 2020-07-01 (×2): qty 1

## 2020-07-01 MED ORDER — MELOXICAM 7.5 MG PO TABS
7.5000 mg | ORAL_TABLET | Freq: Once | ORAL | Status: AC
Start: 2020-07-01 — End: 2020-07-01
  Administered 2020-07-01: 7.5 mg via ORAL
  Filled 2020-07-01: qty 1

## 2020-07-01 MED ORDER — BUTALBITAL-APAP-CAFFEINE 50-325-40 MG PO TABS
1.0000 | ORAL_TABLET | Freq: Once | ORAL | Status: AC
Start: 2020-07-01 — End: 2020-07-01
  Administered 2020-07-01: 1 via ORAL
  Filled 2020-07-01: qty 1

## 2020-07-01 MED ORDER — ONDANSETRON 4 MG PO TBDP
4.0000 mg | ORAL_TABLET | Freq: Three times a day (TID) | ORAL | Status: DC | PRN
Start: 2020-07-01 — End: 2020-07-02

## 2020-07-01 MED ORDER — LACTATED RINGERS IV SOLN
INTRAVENOUS | Status: DC
Start: 2020-07-01 — End: 2020-07-02

## 2020-07-01 MED ORDER — LACTATED RINGERS IV BOLUS
500.0000 mL | Freq: Once | INTRAVENOUS | Status: AC
Start: 2020-07-01 — End: 2020-07-01
  Administered 2020-07-01: 500 mL via INTRAVENOUS

## 2020-07-01 MED ORDER — FAMOTIDINE 20 MG PO TABS
20.00 mg | ORAL_TABLET | Freq: Every evening | ORAL | Status: DC
Start: 2020-07-01 — End: 2020-07-02
  Administered 2020-07-01: 20 mg via ORAL
  Filled 2020-07-01: qty 1

## 2020-07-01 MED ORDER — PROCHLORPERAZINE EDISYLATE 10 MG/2ML IJ SOLN
5.0000 mg | INTRAMUSCULAR | Status: DC | PRN
Start: 2020-07-01 — End: 2020-07-02

## 2020-07-01 MED ORDER — OXYCODONE HCL 5 MG/5ML PO SOLN
10.0000 mg | ORAL | Status: DC | PRN
Start: 2020-07-01 — End: 2020-07-01

## 2020-07-01 MED ORDER — ONDANSETRON HCL 4 MG/2ML IJ SOLN
4.0000 mg | Freq: Three times a day (TID) | INTRAMUSCULAR | Status: DC | PRN
Start: 2020-07-01 — End: 2020-07-02

## 2020-07-01 MED ORDER — LIDOCAINE 5 % EX PTCH
1.0000 | MEDICATED_PATCH | CUTANEOUS | Status: DC
Start: 2020-07-01 — End: 2020-07-02
  Administered 2020-07-01 – 2020-07-02 (×2): 1 via TRANSDERMAL
  Filled 2020-07-01 (×2): qty 1

## 2020-07-01 MED ORDER — OXYCODONE HCL 5 MG PO TABS
5.0000 mg | ORAL_TABLET | ORAL | Status: DC | PRN
Start: 2020-07-01 — End: 2020-07-01

## 2020-07-01 MED ORDER — OXYCODONE HCL 5 MG PO TABS
10.0000 mg | ORAL_TABLET | ORAL | Status: DC | PRN
Start: 2020-07-01 — End: 2020-07-01

## 2020-07-01 MED ORDER — ACETAMINOPHEN 500 MG PO TABS
1000.0000 mg | ORAL_TABLET | Freq: Four times a day (QID) | ORAL | Status: DC
Start: 2020-07-01 — End: 2020-07-02
  Administered 2020-07-01: 500 mg via ORAL
  Administered 2020-07-01 – 2020-07-02 (×5): 1000 mg via ORAL
  Filled 2020-07-01 (×6): qty 2

## 2020-07-01 MED ORDER — OXYCODONE HCL 5 MG/5ML PO SOLN
5.0000 mg | ORAL | Status: DC | PRN
Start: 2020-07-01 — End: 2020-07-01

## 2020-07-01 MED ORDER — ACETAMINOPHEN 160 MG/5ML PO SOLN
1000.0000 mg | Freq: Four times a day (QID) | ORAL | Status: DC
Start: 2020-07-01 — End: 2020-07-02

## 2020-07-01 MED ORDER — MAGNESIUM OXIDE 400 MG TABS (WRAP)
1000.00 mg | ORAL_TABLET | Freq: Every day | ORAL | Status: DC
Start: 2020-07-01 — End: 2020-07-02
  Administered 2020-07-01 – 2020-07-02 (×2): 1000 mg via ORAL
  Filled 2020-07-01 (×2): qty 3

## 2020-07-01 MED ORDER — TRAMADOL HCL 50 MG PO TABS
50.0000 mg | ORAL_TABLET | Freq: Four times a day (QID) | ORAL | Status: DC | PRN
Start: 2020-07-01 — End: 2020-07-02
  Administered 2020-07-01 – 2020-07-02 (×2): 50 mg via ORAL
  Filled 2020-07-01 (×2): qty 1

## 2020-07-01 MED ORDER — DOCUSATE SODIUM 100 MG PO CAPS
100.0000 mg | ORAL_CAPSULE | Freq: Every day | ORAL | Status: DC
Start: 2020-07-01 — End: 2020-07-02
  Administered 2020-07-01 – 2020-07-02 (×2): 100 mg via ORAL
  Filled 2020-07-01 (×2): qty 1

## 2020-07-01 MED ORDER — SENNOSIDES-DOCUSATE SODIUM 8.6-50 MG PO TABS
2.0000 | ORAL_TABLET | Freq: Every evening | ORAL | Status: DC
Start: 2020-07-01 — End: 2020-07-02
  Administered 2020-07-01: 2 via ORAL
  Filled 2020-07-01: qty 2

## 2020-07-01 MED ORDER — MORPHINE SULFATE 2 MG/ML IJ/IV SOLN (WRAP)
2.0000 mg | Status: DC | PRN
Start: 2020-07-01 — End: 2020-07-01

## 2020-07-01 MED ORDER — HYDROMORPHONE HCL 2 MG PO TABS
2.0000 mg | ORAL_TABLET | ORAL | Status: DC | PRN
Start: 2020-07-01 — End: 2020-07-01
  Administered 2020-07-01: 2 mg via ORAL
  Filled 2020-07-01: qty 1

## 2020-07-01 MED ORDER — MORPHINE SULFATE 2 MG/ML IJ/IV SOLN (WRAP)
2.0000 mg | Status: DC | PRN
Start: 2020-07-01 — End: 2020-07-02
  Administered 2020-07-01: 2 mg via INTRAVENOUS
  Filled 2020-07-01: qty 1

## 2020-07-01 MED ORDER — SODIUM CHLORIDE (PF) 0.9 % IJ SOLN
3.0000 mL | Freq: Two times a day (BID) | INTRAMUSCULAR | Status: DC
Start: 2020-07-01 — End: 2020-07-02
  Administered 2020-07-01 – 2020-07-02 (×3): 3 mL via INTRAVENOUS

## 2020-07-01 MED ORDER — MELOXICAM 7.5 MG PO TABS
7.50 mg | ORAL_TABLET | Freq: Every day | ORAL | Status: DC
Start: 2020-07-01 — End: 2020-07-01
  Administered 2020-07-01: 7.5 mg via ORAL
  Filled 2020-07-01: qty 1

## 2020-07-01 MED ORDER — MELOXICAM 7.5 MG PO TABS
15.0000 mg | ORAL_TABLET | Freq: Every day | ORAL | Status: DC
Start: 2020-07-02 — End: 2020-07-02
  Administered 2020-07-02: 15 mg via ORAL
  Filled 2020-07-01: qty 2

## 2020-07-01 NOTE — Plan of Care (Signed)
NURSE NOTE SUMMARY  [facility] - CRITICAL CARE 1   Patient Name: Nicole Sutton, Nicole Sutton   Attending Physician: Rea College, MD   Today's date:   07/01/2020 LOS: 0 days   Shift Summary:                                                              1900: Bedside report rec'd from outgoing RN.Pt has persistent headache since admission. Several Pain meds trialed without much success Dayshift RN called Access @1915  for possible Head CT. MD denied at this time, but Nightshift MD will come to bedside for assessment.   1930: Shift assessment complete, pt. very lethargic and drowsy. Arousable to talk, but drifts to sleep in conversation. Orientedx4. Very weak voice. Left chest tender, guarding with movement and ascultation.   0000: Patient weak but more alert at this time.   0320: PRN Tramadol for severe headache  0600: Pt. Comfortable but painful headache remains     Provider Notifications:   0445: Called Access for reevaluation. Many Trauma's in ED prevented during this shift.     Rapid Response Notifications:  Mobility:      PMP Activity: Step 1 - Bedrest (07/01/2020  7:30 PM)     Weight tracking:  Family Dynamic:   Last 3 Weights for the past 72 hrs (Last 3 readings):   Weight   07/01/20 0250 78.7 kg (173 lb 8 oz)   06/30/20 2058 84.9 kg (187 lb 2.7 oz)        Son at bedside t/o shift     Last Bowel Movement   No data recorded        Problem: Moderate/High Fall Risk Score >5  Description: Fall Risk Score > 5  Goal: Patient will remain free of falls  07/01/2020 2013 by Don Broach, RN  Outcome: Progressing  Flowsheets (Taken 07/01/2020 1930)  VH High Risk (Greater than 13):  Marland Kitchen ALL REQUIRED LOW INTERVENTIONS  . ALL REQUIRED MODERATE INTERVENTIONS  . RED "HIGH FALL RISK" SIGNAGE  . BED ALARM WILL BE ACTIVATED WHEN THE PATEINT IS IN BED WITH SIGNAGE "RESET BED ALARM"  . A CHAIR PAD ALARM WILL BE USED WHEN PATIENT IS UP SITTING IN A CHAIR  . PATIENT IS TO BE SUPERVISED FOR ALL TOILETING ACTIVITIES  .  Keep door open for better visibility  . Include family/significant other in multidisciplinary discussion regarding plan of care as appropriate  07/01/2020 0614 by Don Broach, RN  Outcome: Progressing  Flowsheets (Taken 07/01/2020 0320)  VH Moderate Risk (6-13):  Marland Kitchen ALL REQUIRED LOW INTERVENTIONS  . INITIATE YELLOW "FALL RISK" SIGNAGE  . YELLOW NON-SKID SLIPPERS  . YELLOW "FALL RISK" ARM BAND  . USE OF BED EXIT ALARM IF PATIENT IS CONFUSED OR IMPULSIVE. PLACE RESET BED ALARM SIGN ABOVE BED  . PLACE FALL RISK LEVEL ON WHITE BOARD FOR COMMUNICATION PURPOSES IN PATIENT'S ROOM  . Include family/significant other in multidisciplinary discussion regarding plan of care as appropriate  . Remain with patient during toileting  . Request PT/OT therapy consult order from Physician for patients with gait/mobility impairment     Problem: Compromised Tissue integrity  Goal: Damaged tissue is healing and protected  Outcome: Progressing  Goal: Nutritional status is improving  07/01/2020 2013 by Don Broach, RN  Outcome: Progressing  Flowsheets (Taken 07/01/2020 1610)  Nutritional status is improving:  . Assist patient with eating  . Allow adequate time for meals  . Collaborate with Clinical Nutritionist  07/01/2020 0614 by Don Broach, RN  Outcome: Progressing  Flowsheets (Taken 07/01/2020 [phone])  Nutritional status is improving:  . Assist patient with eating  . Allow adequate time for meals  . Collaborate with Clinical Nutritionist     Problem: Impaired Mobility  Goal: Mobility/Activity is maintained at optimal level for patient  07/01/2020 2013 by Don Broach, RN  Outcome: Progressing  Flowsheets (Taken 07/01/2020 [phone])  Mobility/activity is maintained at optimal level for patient:  . Increase mobility as tolerated/progressive mobility  . Perform active/passive ROM  . Maintain proper body alignment  . Encourage independent activity per ability  . Plan activities to conserve energy, plan rest periods  . Reposition patient every 2  hours and as needed unless able to reposition self  . Assess for changes in respiratory status, level of consciousness and/or development of fatigue  . Consult/collaborate with Physical Therapy and/or Occupational Therapy  07/01/2020 0614 by Don Broach, RN  Outcome: Progressing  Flowsheets (Taken 07/01/2020 [phone])  Mobility/activity is maintained at optimal level for patient:  . Increase mobility as tolerated/progressive mobility  . Perform active/passive ROM  . Maintain proper body alignment  . Encourage independent activity per ability  . Plan activities to conserve energy, plan rest periods  . Reposition patient every 2 hours and as needed unless able to reposition self  . Assess for changes in respiratory status, level of consciousness and/or development of fatigue  . Consult/collaborate with Physical Therapy and/or Occupational Therapy     Problem: Nutrition  Goal: Nutritional intake is adequate  07/01/2020 2013 by Don Broach, RN  Outcome: Progressing  Flowsheets (Taken 07/01/2020 2013)  Nutritional intake is adequate:  Marland Kitchen Monitor daily weights  . Allow adequate time for meals  . Encourage/perform oral hygiene as appropriate  . Encourage/administer dietary supplements as ordered (i.e. tube feed, TPN, oral, OGT/NGT, supplements)  . Consult/collaborate with Clinical Nutritionist  . Include patient/patient care companion in decisions related to nutrition  . Assess anorexia, appetite, and amount of meal/food tolerated  . Consult/collaborate with Speech Therapy (swallow evaluations)  07/01/2020 0614 by Don Broach, RN  Outcome: Progressing     Problem: Pain interferes with ability to perform ADL  Goal: Pain at adequate level as identified by patient  07/01/2020 2013 by Don Broach, RN  Outcome: Progressing  Flowsheets (Taken 07/01/2020 [phone])  Pain at adequate level as identified by patient:  . Identify patient comfort function goal  . Assess for risk of opioid induced respiratory depression, including  snoring/sleep apnea. Alert healthcare team of risk factors identified.  . Assess pain on admission, during daily assessment and/or before any "as needed" intervention(s)  . Reassess pain within 30-60 minutes of any procedure/intervention, per Pain Assessment, Intervention, Reassessment (AIR) Cycle  . Evaluate if patient comfort function goal is met  . Evaluate patient's satisfaction with pain management progress  . Consult/collaborate with Pain Service  . Consult/collaborate with Physical Therapy, Occupational Therapy, and/or Speech Therapy  . Include patient/patient care companion in decisions related to pain management as needed  07/01/2020 0614 by Don Broach, RN  Outcome: Progressing  Flowsheets (Taken 07/01/2020 [phone])  Pain at adequate level as identified by patient:  . Identify patient comfort function goal  . Assess for risk of opioid induced respiratory depression, including snoring/sleep apnea. Alert healthcare team of risk factors  identified.  . Assess pain on admission, during daily assessment and/or before any "as needed" intervention(s)  . Reassess pain within 30-60 minutes of any procedure/intervention, per Pain Assessment, Intervention, Reassessment (AIR) Cycle  . Evaluate if patient comfort function goal is met  . Evaluate patient's satisfaction with pain management progress  . Consult/collaborate with Pain Service  . Consult/collaborate with Physical Therapy, Occupational Therapy, and/or Speech Therapy  . Include patient/patient care companion in decisions related to pain management as needed

## 2020-07-01 NOTE — Plan of Care (Signed)
Problem: Moderate/High Fall Risk Score >5  Description: Fall Risk Score > 5  Goal: Patient will remain free of falls  Outcome: Progressing  Flowsheets (Taken 07/01/2020 0320)  VH Moderate Risk (6-13):   ALL REQUIRED LOW INTERVENTIONS   INITIATE YELLOW "FALL RISK" SIGNAGE   YELLOW NON-SKID SLIPPERS   YELLOW "FALL RISK" ARM BAND   USE OF BED EXIT ALARM IF PATIENT IS CONFUSED OR IMPULSIVE. PLACE RESET BED ALARM SIGN ABOVE BED   PLACE FALL RISK LEVEL ON WHITE BOARD FOR COMMUNICATION PURPOSES IN PATIENT'S ROOM   Include family/significant other in multidisciplinary discussion regarding plan of care as appropriate   Remain with patient during toileting   Request PT/OT therapy consult order from Physician for patients with gait/mobility impairment     Problem: Compromised Tissue integrity  Goal: Damaged tissue is healing and protected  Outcome: Progressing  Goal: Nutritional status is improving  Outcome: Progressing  Flowsheets (Taken 07/01/2020 0614)  Nutritional status is improving:   Assist patient with eating   Allow adequate time for meals   Collaborate with Clinical Nutritionist     Problem: Impaired Mobility  Goal: Mobility/Activity is maintained at optimal level for patient  Outcome: Progressing  Flowsheets (Taken 07/01/2020 0614)  Mobility/activity is maintained at optimal level for patient:   Increase mobility as tolerated/progressive mobility   Perform active/passive ROM   Maintain proper body alignment   Encourage independent activity per ability   Plan activities to conserve energy, plan rest periods   Reposition patient every 2 hours and as needed unless able to reposition self   Assess for changes in respiratory status, level of consciousness and/or development of fatigue   Consult/collaborate with Physical Therapy and/or Occupational Therapy     Problem: Nutrition  Goal: Nutritional intake is adequate  Outcome: Progressing     Problem: Pain interferes with ability to perform ADL  Goal: Pain at adequate level  as identified by patient  Outcome: Progressing  Flowsheets (Taken 07/01/2020 0614)  Pain at adequate level as identified by patient:   Identify patient comfort function goal   Assess for risk of opioid induced respiratory depression, including snoring/sleep apnea. Alert healthcare team of risk factors identified.   Assess pain on admission, during daily assessment and/or before any "as needed" intervention(s)   Reassess pain within 30-60 minutes of any procedure/intervention, per Pain Assessment, Intervention, Reassessment (AIR) Cycle   Evaluate if patient comfort function goal is met   Evaluate patient's satisfaction with pain management progress   Consult/collaborate with Pain Service   Consult/collaborate with Physical Therapy, Occupational Therapy, and/or Speech Therapy   Include patient/patient care companion in decisions related to pain management as needed
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
## 2020-07-01 NOTE — Plan of Care (Addendum)
NURSE NOTE SUMMARY  [facility] - CRITICAL CARE 1   Patient Name: Nicole Sutton, Nicole Sutton   Attending Physician: Rea College, MD   Today's date:   07/01/2020 LOS: 0 days   Shift Summary:                                                              Patient is drowsy but oriented x4. Follows commands. Participates with care. Painful - complaint of headache and sternum/left underbreast area.  Bedrest x24hrs. Assisted patient with turns/care.     Provider Notifications:   Discussed care of patient with Dr. Marchia Bond when he rounded. PRN pain medication changed to Tramadol. AM labs ordered.    16:15 - Dr. Marchia Bond called and notified patient was still having pain 5/10, mostly headache, but also sternum/under left breast pain. Tramadol did not relieve pain and morphine brought pain down to 4/10 but was soon back up to 5/10. Patient does not react well to oxycodone and does not want to take that. States she has tried hydrocodone in the past without much relief. PO dilaudid dropped patient's blood pressure overnight and was discontinued. Main complaint is headache, no other neurological deficits. Did not get head CT on admission. Limited options for pain relief. MD aware. Ordered one dose of Fioricet.    19:00 - Patient continues to have headache 5/10 despite Fioricet. Patient has also been more drowsy throughout the day. Hesitate to give more PRN morphine due to drowsiness. MD notified. Will review.   Rapid Response Notifications:  Mobility:     N/A PMP Activity: Step 1 - Bedrest (07/01/2020  7:41 AM)     Weight tracking:  Family Dynamic:   Last 3 Weights for the past 72 hrs (Last 3 readings):   Weight   07/01/20 0250 78.7 kg (173 lb 8 oz)   06/30/20 2058 84.9 kg (187 lb 2.7 oz)    Patient's son at bedside.     Last Bowel Movement   No data recorded        Problem: Moderate/High Fall Risk Score >5  Goal: Patient will remain free of falls  Outcome: Progressing     Problem: Compromised Tissue  integrity  Goal: Damaged tissue is healing and protected  Outcome: Progressing  Goal: Nutritional status is improving  Outcome: Progressing     Problem: Impaired Mobility  Goal: Mobility/Activity is maintained at optimal level for patient  Outcome: Progressing     Problem: Nutrition  Goal: Nutritional intake is adequate  Outcome: Progressing     Problem: Pain interferes with ability to perform ADL  Goal: Pain at adequate level as identified by patient  Outcome: Progressing

## 2020-07-01 NOTE — PT Plan of Care Note (Signed)
[facility]  [facility]  Department of Rehabilitation Services  [phone]  Jahniyah Revere CSN: [id number]  CRITICAL CARE 1 3512/3512-A    Physical Therapy General Note  Time: 1019    Last seen by a Physical Therapist vs. PTA: n/a    Attempted to see patient for PT evaluation; however patient on bedrest x 24 hours.    Team Communication: RN-Kindra, Butler Denmark    Plan: Will follow up tomorrow per POC.     Christella Hartigan, PT,DPT
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
## 2020-07-01 NOTE — ED Notes (Signed)
May take patient to ICU at this time per receiving RN.

## 2020-07-02 ENCOUNTER — Inpatient Hospital Stay: Payer: Medicare PPO

## 2020-07-02 DIAGNOSIS — S37032A Laceration of left kidney, unspecified degree, initial encounter: Secondary | ICD-10-CM

## 2020-07-02 DIAGNOSIS — S36222A Contusion of tail of pancreas, initial encounter: Secondary | ICD-10-CM

## 2020-07-02 LAB — CBC AND DIFFERENTIAL
Basophils %: 0 % (ref 0.0–3.0)
Basophils Absolute: 0 10*3/uL (ref 0.0–0.3)
Eosinophils %: 1.2 % (ref 0.0–7.0)
Eosinophils Absolute: 0 10*3/uL (ref 0.0–0.8)
Hematocrit: 30.1 % — ABNORMAL LOW (ref 36.0–48.0)
Hemoglobin: 9.9 gm/dL — ABNORMAL LOW (ref 12.0–16.0)
Lymphocytes Absolute: 0.8 10*3/uL (ref 0.6–5.1)
Lymphocytes: 31.3 % (ref 15.0–46.0)
MCH: 32 pg (ref 28–35)
MCHC: 33 gm/dL (ref 32–36)
MCV: 98 fL (ref 80–100)
MPV: 9 fL (ref 6.0–10.0)
Monocytes Absolute: 0.4 10*3/uL (ref 0.1–1.7)
Monocytes: 15.5 % — ABNORMAL HIGH (ref 3.0–15.0)
Neutrophils %: 52 % (ref 42.0–78.0)
Neutrophils Absolute: 1.3 10*3/uL — ABNORMAL LOW (ref 1.7–8.6)
PLT CT: 117 10*3/uL — ABNORMAL LOW (ref 130–440)
RBC: 3.07 10*6/uL — ABNORMAL LOW (ref 3.80–5.00)
RDW: 12.9 % (ref 11.0–14.0)
WBC: 2.4 10*3/uL — ABNORMAL LOW (ref 4.0–11.0)

## 2020-07-02 LAB — BASIC METABOLIC PANEL
Anion Gap: 11.5 mMol/L (ref 7.0–18.0)
BUN / Creatinine Ratio: 15.9 Ratio (ref 10.0–30.0)
BUN: 10 mg/dL (ref 7–22)
CO2: 25 mMol/L (ref 20–30)
Calcium: 8.3 mg/dL — ABNORMAL LOW (ref 8.5–10.5)
Chloride: 107 mMol/L (ref 98–110)
Creatinine: 0.63 mg/dL (ref 0.60–1.20)
EGFR: 91 mL/min/{1.73_m2} (ref 60–150)
Glucose: 83 mg/dL (ref 71–99)
Osmolality Calculated: 278 mOsm/kg (ref 275–300)
Potassium: 3.5 mMol/L (ref 3.5–5.3)
Sodium: 140 mMol/L (ref 136–147)

## 2020-07-02 MED ORDER — IBUPROFEN 200 MG PO TABS
400.0000 mg | ORAL_TABLET | Freq: Four times a day (QID) | ORAL | 0 refills | Status: DC | PRN
Start: 2020-07-02 — End: 2020-09-10

## 2020-07-02 MED ORDER — LIDOCAINE 5 % EX PTCH
1.0000 | MEDICATED_PATCH | CUTANEOUS | Status: DC
Start: 2020-07-03 — End: 2020-09-10

## 2020-07-02 MED ORDER — ACETAMINOPHEN 160 MG/5ML PO SOLN
1000.0000 mg | Freq: Three times a day (TID) | ORAL | Status: DC
Start: 2020-07-02 — End: 2020-07-02

## 2020-07-02 MED ORDER — ACETAMINOPHEN 500 MG PO TABS
1000.0000 mg | ORAL_TABLET | Freq: Three times a day (TID) | ORAL | Status: DC
Start: 2020-07-02 — End: 2020-07-02

## 2020-07-02 MED ORDER — BUTALBITAL-APAP-CAFFEINE 50-325-40 MG PO TABS
2.0000 | ORAL_TABLET | Freq: Once | ORAL | Status: DC
Start: 2020-07-02 — End: 2020-07-02
  Filled 2020-07-02: qty 2

## 2020-07-02 MED ORDER — ENOXAPARIN SODIUM 40 MG/0.4ML SC SOLN
40.0000 mg | SUBCUTANEOUS | Status: DC
Start: 2020-07-02 — End: 2020-07-02
  Administered 2020-07-02: 40 mg via SUBCUTANEOUS
  Filled 2020-07-02: qty 0.4

## 2020-07-02 MED ORDER — TRAMADOL HCL 50 MG PO TABS
50.0000 mg | ORAL_TABLET | ORAL | Status: DC | PRN
Start: 2020-07-02 — End: 2020-07-02
  Administered 2020-07-02: 50 mg via ORAL
  Filled 2020-07-02: qty 1

## 2020-07-02 MED ORDER — TRAMADOL HCL 50 MG PO TABS
50.0000 mg | ORAL_TABLET | ORAL | 0 refills | Status: AC | PRN
Start: 2020-07-02 — End: 2020-07-09

## 2020-07-02 MED ORDER — KETOROLAC TROMETHAMINE 30 MG/ML IJ SOLN
15.0000 mg | Freq: Four times a day (QID) | INTRAMUSCULAR | Status: DC
Start: 2020-07-02 — End: 2020-07-02
  Administered 2020-07-02: 15 mg via INTRAVENOUS
  Filled 2020-07-02: qty 1

## 2020-07-02 MED ORDER — ACETAMINOPHEN 500 MG PO TABS
1000.0000 mg | ORAL_TABLET | Freq: Three times a day (TID) | ORAL | Status: DC
Start: 2020-07-02 — End: 2020-09-10
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
## 2020-07-02 NOTE — Plan of Care (Signed)
Problem: Moderate/High Fall Risk Score >5  Goal: Patient will remain free of falls  Outcome: Completed     Problem: Compromised Tissue integrity  Goal: Damaged tissue is healing and protected  Outcome: Completed  Goal: Nutritional status is improving  Outcome: Completed     Problem: Impaired Mobility  Goal: Mobility/Activity is maintained at optimal level for patient  Outcome: Completed     Problem: Nutrition  Goal: Nutritional intake is adequate  Outcome: Completed     Problem: Pain interferes with ability to perform ADL  Goal: Pain at adequate level as identified by patient  Outcome: Completed
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
## 2020-07-02 NOTE — Progress Notes (Signed)
Went over discharge instructions with patient and patient's son, no further questions. Patient transporedt to meet son by wheel chair at main entrance for discharge.
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
## 2020-07-02 NOTE — Discharge Instr - AVS First Page (Addendum)
Diet:  Resume previous home diet as tolerated.  No restrictions.    Activity:  Avoid strenuous activity, lifting > 10-15 pounds, or contact sports/"high risk" activities for 2 weeks.    Medications:  Refer to the After Visit Summary for updates and new medications.  Take over-the-counter Tylenol (aka acetaminophen) and Advil/Motrin (aka ibuprofen) or other NSAIDs before taking the stronger prescribed medication, tramadol, for moderate to severe breakthrough pain.    Wound/Drain Care:  None    Instructions:  Call/Return if temperature over 101.0, refractory nausea/vomiting, increased pain not relieved by medications, or new onset of chest pain or shortness or breath.  Call/Return for new onset dizziness/light-headedness, numbness or tingling of the extremities or increased confusion.  Make sure to have your blood work drawn before your next appointment.  Having it done the morning of your appointment is preferred.    For additional questions or concerns after your discharge, you may contact the Trauma Acute Care Surgery (TACS) Office with [facility] at [phone] between the hours of 8:00am - 4:30pm.  Any medication refill requests should be called to the office during daytime business hours.  If you have an urgent need that occurs after office hours, call [phone] and ask to speak to the Trauma Acute Care Surgery [facility]) physician on call.  No medication refills will be handled outside of business hours.  Emergencies should go to the Emergency Room and cannot be handled over the phone.    Trauma Acute Care Surgery (TACS) BOWEL MAINTENANCE GUIDELINES    The use of narcotic pain medications and decreased activity will lead to constipation.  In order to prevent this from happening to you, please follow these recommendations:  Drink adequate amounts of fluid throughout the day.    Take an over-the-counter stool softener, such as Colace, once or twice a day depending on the firmness of your stools.  You  may take another over-the-counter stool softener, such as Senokot or generic Senna, two tablets at bedtime.  Add Metamucil or other similar fiber supplement to your diet twice a day.    If constipation persists:   Add Milk of Magnesia two (2) tablespoons (30mL) every 6    hours (up to 8 doses).  If you are experiencing rectal fullness or pressure, try a Dulcolax and/or   glycerin suppository every 12 hours.    Avoid anti-diarrhea medications once your bowels begin to move.  Loose         stools should resolve on their own after 2-3 days.  Gas relief medications such as Gas-X or Gaviscon are acceptable for use.    Probiotic food products, such as yogurt, are acceptable especially if you are or have been on antibiotics.    Additional Recipe for Success (Natural Stool Softener):    -1 cup of unsweetened applesauce,   - cup of unsweetened prune juice,   -1 cup of unprocessed bran (ex., Miller's brand - can be found at  [employer], [employer],        [place], or other health food stores).    Mix together and store in refrigerator.    Take 2 tablespoons with a large glass of water daily and expect results 6-8 hours later.  You may add 1 tablespoon every 5-7 days if needed.   *can be used as long as desired, non-addictive, & gentle
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
## 2020-07-03 ENCOUNTER — Emergency Department: Payer: Medicare PPO

## 2020-07-03 ENCOUNTER — Emergency Department
Admission: EM | Admit: 2020-07-03 | Discharge: 2020-07-03 | Disposition: A | Discharge status: Home / Self Care | Payer: Medicare PPO | Attending: Emergency Medicine | Admitting: Emergency Medicine

## 2020-07-03 DIAGNOSIS — S2220XD Unspecified fracture of sternum, subsequent encounter for fracture with routine healing: Secondary | ICD-10-CM | POA: Insufficient documentation

## 2020-07-03 DIAGNOSIS — R0789 Other chest pain: Secondary | ICD-10-CM | POA: Insufficient documentation

## 2020-07-03 DIAGNOSIS — J40 Bronchitis, not specified as acute or chronic: Secondary | ICD-10-CM | POA: Insufficient documentation

## 2020-07-03 DIAGNOSIS — S2220XA Unspecified fracture of sternum, initial encounter for closed fracture: Secondary | ICD-10-CM

## 2020-07-03 DIAGNOSIS — S86912A Strain of unspecified muscle(s) and tendon(s) at lower leg level, left leg, initial encounter: Secondary | ICD-10-CM | POA: Insufficient documentation

## 2020-07-03 DIAGNOSIS — U071 COVID-19: Secondary | ICD-10-CM | POA: Insufficient documentation

## 2020-07-03 DIAGNOSIS — X58XXXA Exposure to other specified factors, initial encounter: Secondary | ICD-10-CM | POA: Insufficient documentation

## 2020-07-03 LAB — CBC AND DIFFERENTIAL
Basophils %: 0.3 % (ref 0.0–3.0)
Basophils Absolute: 0 10*3/uL (ref 0.0–0.3)
Eosinophils %: 1 % (ref 0.0–7.0)
Eosinophils Absolute: 0 10*3/uL (ref 0.0–0.8)
Hematocrit: 35.7 % — ABNORMAL LOW (ref 36.0–48.0)
Hemoglobin: 11.7 gm/dL — ABNORMAL LOW (ref 12.0–16.0)
Lymphocytes Absolute: 0.8 10*3/uL (ref 0.6–5.1)
Lymphocytes: 18.6 % (ref 15.0–46.0)
MCH: 33 pg (ref 28–35)
MCHC: 33 gm/dL (ref 32–36)
MCV: 99 fL (ref 80–100)
MPV: 9.2 fL (ref 6.0–10.0)
Monocytes Absolute: 0.4 10*3/uL (ref 0.1–1.7)
Monocytes: 9.3 % (ref 3.0–15.0)
Neutrophils %: 70.7 % (ref 42.0–78.0)
Neutrophils Absolute: 3.1 10*3/uL (ref 1.7–8.6)
PLT CT: 140 10*3/uL (ref 130–440)
RBC: 3.62 10*6/uL — ABNORMAL LOW (ref 3.80–5.00)
RDW: 12.8 % (ref 11.0–14.0)
WBC: 4.4 10*3/uL (ref 4.0–11.0)

## 2020-07-03 LAB — BASIC METABOLIC PANEL
Anion Gap: 15.3 mMol/L (ref 7.0–18.0)
BUN / Creatinine Ratio: 16.4 Ratio (ref 10.0–30.0)
BUN: 11 mg/dL (ref 7–22)
CO2: 22 mMol/L (ref 20–30)
Calcium: 8.7 mg/dL (ref 8.5–10.5)
Chloride: 105 mMol/L (ref 98–110)
Creatinine: 0.67 mg/dL (ref 0.60–1.20)
EGFR: 89 mL/min/{1.73_m2} (ref 60–150)
Glucose: 85 mg/dL (ref 71–99)
Osmolality Calculated: 274 mOsm/kg — ABNORMAL LOW (ref 275–300)
Potassium: 4.3 mMol/L (ref 3.5–5.3)
Sodium: 138 mMol/L (ref 136–147)

## 2020-07-03 LAB — D-DIMER, QUANTITATIVE: D-Dimer: 2.27 mg/L FEU — ABNORMAL HIGH (ref 0.19–0.52)

## 2020-07-03 MED ORDER — AMOXICILLIN 500 MG PO CAPS
500.0000 mg | ORAL_CAPSULE | Freq: Three times a day (TID) | ORAL | 0 refills | Status: AC
Start: 2020-07-03 — End: 2020-07-13

## 2020-07-03 MED ORDER — IOHEXOL 350 MG/ML IV SOLN
70.0000 mL | Freq: Once | INTRAVENOUS | Status: AC | PRN
Start: 2020-07-03 — End: 2020-07-03
  Administered 2020-07-03: 70 mL via INTRAVENOUS
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
## 2020-07-03 NOTE — ED Notes (Signed)
XR bedside.

## 2020-07-03 NOTE — ED Notes (Signed)
Patient back from CT. VSS. Patient denies complaints at this time. Will continue to monitor.

## 2020-07-04 LAB — VH APTIMA SARS-COV-2 ASSAY (PANTHER SYSTEM)(TM)
Aptima SARS-CoV-2: POSITIVE — CR
Date of Onset: 20220127
Does patient reside in a congregate care setting?: NEGATIVE
Is patient employed in a healthcare setting?: NEGATIVE
Is the patient pregnant?: NEGATIVE

## 2020-07-05 ENCOUNTER — Telehealth (INDEPENDENT_AMBULATORY_CARE_PROVIDER_SITE_OTHER): Payer: Self-pay | Admitting: Family Medicine

## 2020-07-05 ENCOUNTER — Other Ambulatory Visit (HOSPITAL_COMMUNITY): Payer: Self-pay | Admitting: Internal Medicine

## 2020-07-05 ENCOUNTER — Encounter (HOSPITAL_COMMUNITY): Payer: Self-pay

## 2020-07-05 LAB — ECG 12-LEAD
P Wave Axis: 40 deg
P-R Interval: 100 ms
Patient Age: 70 years
Q-T Interval(Corrected): 429 ms
Q-T Interval: 367 ms
QRS Axis: -59 deg
QRS Duration: 116 ms
T Axis: 22 years
Ventricular Rate: 82 //min

## 2020-07-05 NOTE — Telephone Encounter (Signed)
There is only one pharmacy in the area that has it and that is [facility] in [place].  I need the day that her symptoms started and vitals that she has had done including oxyern level since she has been sick.
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
## 2020-07-05 NOTE — Telephone Encounter (Signed)
LM for pt to return call  Elsie Ra, [place]  07/05/2020, 10:47

## 2020-07-05 NOTE — Telephone Encounter (Signed)
Pt LVM returning Megan's call.      Wesley Blas  07/05/2020, 10:52

## 2020-07-05 NOTE — Telephone Encounter (Signed)
Yes she wants the medication. [name] in [place], [place]  07/05/2020, 11:11

## 2020-07-05 NOTE — Nursing Note (Signed)
Received order for monoclonal antibody therapy. Per pharmacist Asencion Noble patient does not qualify at this time. Left message for ordering prescriber Debbora Presto FNP at [phone]  Regarding this.

## 2020-07-05 NOTE — Telephone Encounter (Signed)
Please advise  Elsie Ra, [place]  07/05/2020, 09:18

## 2020-07-05 NOTE — Telephone Encounter (Signed)
She does not qualify currently in [name]. I can send in a prescription for Paxlovid if her symptoms did not start more than 5 days ago.

## 2020-07-05 NOTE — Telephone Encounter (Signed)
Sx started on 1/29. BP 133/62, P 78, Temp 98.5, R 20, O2 98, Wt 173, Ht [address]  [place], [place]  07/05/2020, 12:08

## 2020-07-05 NOTE — Telephone Encounter (Signed)
Pt calling to have order placed for monoclonal antibodies infusion. Pt tested positive via [facility] 01.29.22. Positive test scanned into media 01.31.22 under [facility] ED note.        [name]  07/05/2020, 09:12

## 2020-07-05 NOTE — Telephone Encounter (Signed)
I tried to order paxlovid but due to her allergy to polyethylene glycol I will not be able to due to possible cross reactivity

## 2020-07-06 MED ORDER — TRAMADOL 50 MG TABLET
1.0000 | ORAL_TABLET | ORAL | 0 refills | Status: DC | PRN
Start: 2020-07-06 — End: 2020-09-08

## 2020-07-06 NOTE — Telephone Encounter (Signed)
Pt advised.  [name], [place]  07/06/2020, 15:06

## 2020-07-06 NOTE — Telephone Encounter (Signed)
What is she taking for pain? 

## 2020-07-06 NOTE — Telephone Encounter (Signed)
Please notify patient that I sent in a refill for tramadol for pain management until she is able to follow up with me

## 2020-07-06 NOTE — Telephone Encounter (Signed)
Pt Stated she was taking Tramadol for pain and she is only has 2 left.  Romero Belling, [place]  07/06/2020, 10:47

## 2020-07-14 ENCOUNTER — Encounter (INDEPENDENT_AMBULATORY_CARE_PROVIDER_SITE_OTHER): Payer: Self-pay | Admitting: Family Medicine

## 2020-07-14 ENCOUNTER — Ambulatory Visit (INDEPENDENT_AMBULATORY_CARE_PROVIDER_SITE_OTHER): Payer: Self-pay | Admitting: Family Medicine

## 2020-07-14 ENCOUNTER — Ambulatory Visit: Payer: Medicare Other | Attending: Family Medicine

## 2020-07-14 ENCOUNTER — Other Ambulatory Visit: Payer: Self-pay

## 2020-07-14 VITALS — BP 118/72 | HR 88 | Temp 98.2°F | Resp 18 | Ht 68.0 in | Wt 172.0 lb

## 2020-07-14 DIAGNOSIS — S37039A Laceration of unspecified kidney, unspecified degree, initial encounter: Secondary | ICD-10-CM | POA: Insufficient documentation

## 2020-07-14 DIAGNOSIS — Z1589 Genetic susceptibility to other disease: Secondary | ICD-10-CM

## 2020-07-14 DIAGNOSIS — R918 Other nonspecific abnormal finding of lung field: Secondary | ICD-10-CM

## 2020-07-14 DIAGNOSIS — S8990XA Unspecified injury of unspecified lower leg, initial encounter: Secondary | ICD-10-CM

## 2020-07-14 DIAGNOSIS — S2222XD Fracture of body of sternum, subsequent encounter for fracture with routine healing: Secondary | ICD-10-CM

## 2020-07-14 LAB — CBC W/AUTO DIFF
BASOPHIL #: <0.1 10*3/uL (ref <=0.20)
BASOPHIL %: 1 %
EOSINOPHIL #: <0.1 10*3/uL (ref <=0.50)
EOSINOPHIL %: 2 %
HCT: 34 % — ABNORMAL LOW (ref 34.8–46.0)
HGB: 10.4 g/dL — ABNORMAL LOW (ref 11.5–16.0)
IMMATURE GRANULOCYTE #: <0.1 10*3/uL (ref <0.10)
IMMATURE GRANULOCYTE %: 0 % (ref 0–1)
LYMPHOCYTE #: 1.21 10*3/uL (ref 1.00–4.80)
LYMPHOCYTE %: 27 %
MCH: 29.4 pg (ref 26.0–32.0)
MCHC: 30.6 g/dL — ABNORMAL LOW (ref 31.0–35.5)
MCV: 96 fL (ref 78.0–100.0)
MONOCYTE #: 0.58 10*3/uL (ref 0.20–1.10)
MONOCYTE %: 13 %
MPV: 11.1 fL (ref 8.7–12.5)
NEUTROPHIL #: 2.55 10*3/uL (ref 1.50–7.70)
NEUTROPHIL %: 57 %
PLATELETS: 291 10*3/uL (ref 150–400)
RBC: 3.54 10*6/uL — ABNORMAL LOW (ref 3.85–5.22)
RDW-CV: 13.8 % (ref 11.5–15.5)
WBC: 4.5 10*3/uL (ref 3.7–11.0)

## 2020-07-14 MED ORDER — FLUCONAZOLE 150 MG TABLET
150.0000 mg | ORAL_TABLET | Freq: Once | ORAL | 0 refills | Status: AC
Start: 2020-07-14 — End: 2020-07-14

## 2020-07-14 MED ORDER — DOXYCYCLINE HYCLATE 100 MG TABLET
100.0000 mg | ORAL_TABLET | Freq: Two times a day (BID) | ORAL | 0 refills | Status: DC
Start: 2020-07-14 — End: 2020-09-08

## 2020-07-14 MED ORDER — HYDROCODONE 5 MG-ACETAMINOPHEN 325 MG TABLET
1.0000 | ORAL_TABLET | ORAL | 0 refills | Status: DC | PRN
Start: 2020-07-14 — End: 2020-09-08

## 2020-07-14 MED ORDER — DICLOFENAC 1 % TOPICAL GEL
Freq: Three times a day (TID) | CUTANEOUS | 5 refills | Status: DC
Start: 2020-07-14 — End: 2023-02-17

## 2020-07-14 NOTE — Nursing Note (Signed)
07/14/20 1400   Depression Screen   Little interest or pleasure in doing things. 0   Feeling down, depressed, or hopeless 0   PHQ 2 Total 0
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
## 2020-07-15 ENCOUNTER — Encounter (INDEPENDENT_AMBULATORY_CARE_PROVIDER_SITE_OTHER): Payer: Self-pay

## 2020-07-20 ENCOUNTER — Encounter (INDEPENDENT_AMBULATORY_CARE_PROVIDER_SITE_OTHER): Payer: Self-pay | Admitting: Family Medicine

## 2020-08-04 ENCOUNTER — Encounter (INDEPENDENT_AMBULATORY_CARE_PROVIDER_SITE_OTHER): Payer: Self-pay | Admitting: Family Medicine

## 2020-08-04 ENCOUNTER — Ambulatory Visit (INDEPENDENT_AMBULATORY_CARE_PROVIDER_SITE_OTHER): Payer: Medicare Other

## 2020-08-04 ENCOUNTER — Ambulatory Visit
Admission: RE | Admit: 2020-08-04 | Discharge: 2020-08-04 | Disposition: A | Discharge status: Home / Self Care | Payer: Medicare Other | Source: Ambulatory Visit | Attending: Family Medicine | Admitting: Family Medicine

## 2020-08-04 ENCOUNTER — Ambulatory Visit (INDEPENDENT_AMBULATORY_CARE_PROVIDER_SITE_OTHER): Payer: Medicare Other | Admitting: Family Medicine

## 2020-08-04 ENCOUNTER — Other Ambulatory Visit: Payer: Self-pay

## 2020-08-04 ENCOUNTER — Ambulatory Visit (HOSPITAL_COMMUNITY): Payer: Medicare Other

## 2020-08-04 VITALS — BP 130/88 | HR 88 | Temp 98.3°F | Resp 18 | Ht 68.0 in | Wt 169.0 lb

## 2020-08-04 DIAGNOSIS — D649 Anemia, unspecified: Secondary | ICD-10-CM

## 2020-08-04 DIAGNOSIS — N2889 Other specified disorders of kidney and ureter: Secondary | ICD-10-CM | POA: Insufficient documentation

## 2020-08-04 DIAGNOSIS — M25562 Pain in left knee: Secondary | ICD-10-CM

## 2020-08-04 DIAGNOSIS — S2222XA Fracture of body of sternum, initial encounter for closed fracture: Secondary | ICD-10-CM

## 2020-08-04 DIAGNOSIS — X58XXXA Exposure to other specified factors, initial encounter: Secondary | ICD-10-CM

## 2020-08-04 DIAGNOSIS — M1712 Unilateral primary osteoarthritis, left knee: Secondary | ICD-10-CM

## 2020-08-04 DIAGNOSIS — S8990XA Unspecified injury of unspecified lower leg, initial encounter: Secondary | ICD-10-CM | POA: Insufficient documentation

## 2020-08-04 DIAGNOSIS — S2222XS Fracture of body of sternum, sequela: Secondary | ICD-10-CM

## 2020-08-04 DIAGNOSIS — R0789 Other chest pain: Secondary | ICD-10-CM | POA: Insufficient documentation

## 2020-08-04 DIAGNOSIS — M7918 Myalgia, other site: Secondary | ICD-10-CM | POA: Insufficient documentation

## 2020-08-04 DIAGNOSIS — K862 Cyst of pancreas: Secondary | ICD-10-CM

## 2020-08-04 DIAGNOSIS — M25561 Pain in right knee: Secondary | ICD-10-CM

## 2020-08-04 DIAGNOSIS — F419 Anxiety disorder, unspecified: Secondary | ICD-10-CM

## 2020-08-04 LAB — BASIC METABOLIC PANEL
ANION GAP: 9 mmol/L (ref 4–13)
BUN/CREA RATIO: 30 — ABNORMAL HIGH (ref 6–22)
BUN: 22 mg/dL (ref 8–25)
CALCIUM: 9.4 mg/dL (ref 8.8–10.2)
CHLORIDE: 109 mmol/L (ref 96–111)
CO2 TOTAL: 22 mmol/L — ABNORMAL LOW (ref 23–31)
CREATININE: 0.74 mg/dL (ref 0.60–1.05)
ESTIMATED GFR: 82 mL/min/BSA (ref >=60)
GLUCOSE: 82 mg/dL (ref 65–125)
POTASSIUM: 4.1 mmol/L (ref 3.5–5.1)
SODIUM: 140 mmol/L (ref 136–145)

## 2020-08-04 LAB — CBC W/AUTO DIFF
BASOPHIL #: <0.1 10*3/uL (ref <=0.20)
BASOPHIL %: 1 %
EOSINOPHIL #: 0.13 10*3/uL (ref <=0.50)
EOSINOPHIL %: 3 %
HCT: 34.5 % — ABNORMAL LOW (ref 34.8–46.0)
HGB: 10.5 g/dL — ABNORMAL LOW (ref 11.5–16.0)
IMMATURE GRANULOCYTE #: <0.1 10*3/uL (ref <0.10)
IMMATURE GRANULOCYTE %: 0 % (ref 0–1)
LYMPHOCYTE #: 1.08 10*3/uL (ref 1.00–4.80)
LYMPHOCYTE %: 21 %
MCH: 29.7 pg (ref 26.0–32.0)
MCHC: 30.4 g/dL — ABNORMAL LOW (ref 31.0–35.5)
MCV: 97.7 fL (ref 78.0–100.0)
MONOCYTE #: 0.43 10*3/uL (ref 0.20–1.10)
MONOCYTE %: 8 %
MPV: 11.9 fL (ref 8.7–12.5)
NEUTROPHIL #: 3.49 10*3/uL (ref 1.50–7.70)
NEUTROPHIL %: 67 %
PLATELETS: 183 10*3/uL (ref 150–400)
RBC: 3.53 10*6/uL — ABNORMAL LOW (ref 3.85–5.22)
RDW-CV: 14.6 % (ref 11.5–15.5)
WBC: 5.2 10*3/uL (ref 3.7–11.0)

## 2020-08-04 MED ORDER — ESCITALOPRAM 5 MG TABLET
5.0000 mg | ORAL_TABLET | Freq: Every day | ORAL | 0 refills | Status: DC
Start: 2020-08-04 — End: 2020-09-08
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
## 2020-08-05 LAB — FERRITIN: FERRITIN: 20 ng/mL (ref 5–200)

## 2020-08-19 ENCOUNTER — Ambulatory Visit
Admission: RE | Admit: 2020-08-19 | Discharge: 2020-08-19 | Disposition: A | Discharge status: Home / Self Care | Payer: Medicare Other | Source: Ambulatory Visit | Attending: Family Medicine | Admitting: Family Medicine

## 2020-08-19 ENCOUNTER — Other Ambulatory Visit: Payer: Self-pay

## 2020-08-19 DIAGNOSIS — K862 Cyst of pancreas: Secondary | ICD-10-CM | POA: Insufficient documentation

## 2020-08-19 DIAGNOSIS — N2889 Other specified disorders of kidney and ureter: Secondary | ICD-10-CM | POA: Insufficient documentation

## 2020-08-19 DIAGNOSIS — K7689 Other specified diseases of liver: Secondary | ICD-10-CM

## 2020-08-19 MED ORDER — GADOBUTROL 15 MMOL/15 ML (1 MMOL/ML) INTRAVENOUS SYRINGE
15.0000 mL | INJECTION | INTRAVENOUS | Status: AC
Start: 2020-08-19 — End: 2020-08-19
  Administered 2020-08-19: 6 mL via INTRAVENOUS
  Filled 2020-08-19: qty 15

## 2020-08-24 ENCOUNTER — Other Ambulatory Visit (HOSPITAL_BASED_OUTPATIENT_CLINIC_OR_DEPARTMENT_OTHER): Payer: Self-pay

## 2020-08-24 DIAGNOSIS — M222X2 Patellofemoral disorders, left knee: Secondary | ICD-10-CM

## 2020-08-26 ENCOUNTER — Other Ambulatory Visit (INDEPENDENT_AMBULATORY_CARE_PROVIDER_SITE_OTHER): Payer: Self-pay | Admitting: Family Medicine

## 2020-08-26 DIAGNOSIS — S37012S Minor contusion of left kidney, sequela: Secondary | ICD-10-CM

## 2020-08-27 ENCOUNTER — Telehealth (INDEPENDENT_AMBULATORY_CARE_PROVIDER_SITE_OTHER): Payer: Self-pay | Admitting: Urology

## 2020-08-27 NOTE — Telephone Encounter (Addendum)
L/m on v/m requesting a call back to schedule appt from referral.   Concepcion Elk, Office Staff  08/27/2020, 10:04

## 2020-08-27 NOTE — Telephone Encounter (Signed)
Patient is scheduled.   [name], Office Staff  08/27/2020, 11:26

## 2020-08-27 NOTE — Telephone Encounter (Signed)
-----   Message from Marthe Patch, MD sent at 08/26/2020  3:51 PM EDT -----  Regarding: RE: Please Review Referral  Schedule within next 3 weeks if possible  ----- Message -----  From: Concepcion Elk, Office Staff  Sent: 08/26/2020   3:40 PM EDT  To: Marthe Patch, MD, Barrett Henle, APRN  Subject: Please Review Referral                           Patient was referred to our office for Kidney hematoma, left, sequela. Patient had MRI on 3/17. Please review office notes from Dr Sabra Heck and MRI. Please advise on when to schedule.     Thank you,   Concepcion Elk, Office Staff  08/26/2020, 15:40

## 2020-09-08 ENCOUNTER — Ambulatory Visit: Payer: Medicare Other | Attending: Family Medicine

## 2020-09-08 ENCOUNTER — Ambulatory Visit (INDEPENDENT_AMBULATORY_CARE_PROVIDER_SITE_OTHER): Payer: Medicare Other | Admitting: Family Medicine

## 2020-09-08 ENCOUNTER — Encounter (INDEPENDENT_AMBULATORY_CARE_PROVIDER_SITE_OTHER): Payer: Self-pay | Admitting: Family Medicine

## 2020-09-08 ENCOUNTER — Other Ambulatory Visit: Payer: Self-pay

## 2020-09-08 VITALS — BP 132/80 | HR 76 | Temp 98.1°F | Resp 18 | Ht 68.0 in | Wt 167.0 lb

## 2020-09-08 DIAGNOSIS — Z9109 Other allergy status, other than to drugs and biological substances: Secondary | ICD-10-CM

## 2020-09-08 DIAGNOSIS — Z91030 Bee allergy status: Secondary | ICD-10-CM

## 2020-09-08 DIAGNOSIS — R109 Unspecified abdominal pain: Secondary | ICD-10-CM

## 2020-09-08 DIAGNOSIS — D379 Neoplasm of uncertain behavior of digestive organ, unspecified: Secondary | ICD-10-CM

## 2020-09-08 DIAGNOSIS — K8689 Other specified diseases of pancreas: Secondary | ICD-10-CM

## 2020-09-08 DIAGNOSIS — Z6825 Body mass index (BMI) 25.0-25.9, adult: Secondary | ICD-10-CM

## 2020-09-08 DIAGNOSIS — D649 Anemia, unspecified: Secondary | ICD-10-CM

## 2020-09-08 DIAGNOSIS — S2222XD Fracture of body of sternum, subsequent encounter for fracture with routine healing: Secondary | ICD-10-CM

## 2020-09-08 LAB — CBC W/AUTO DIFF
BASOPHIL #: <0.1 10*3/uL (ref <=0.20)
BASOPHIL %: 1 %
EOSINOPHIL #: 0.13 10*3/uL (ref <=0.50)
EOSINOPHIL %: 3 %
HCT: 36.4 % (ref 34.8–46.0)
HGB: 11 g/dL — ABNORMAL LOW (ref 11.5–16.0)
IMMATURE GRANULOCYTE #: <0.1 10*3/uL (ref <0.10)
IMMATURE GRANULOCYTE %: 0 % (ref 0–1)
LYMPHOCYTE #: 1.37 10*3/uL (ref 1.00–4.80)
LYMPHOCYTE %: 29 %
MCH: 29.9 pg (ref 26.0–32.0)
MCHC: 30.2 g/dL — ABNORMAL LOW (ref 31.0–35.5)
MCV: 98.9 fL (ref 78.0–100.0)
MONOCYTE #: 0.36 10*3/uL (ref 0.20–1.10)
MONOCYTE %: 8 %
MPV: 11.6 fL (ref 8.7–12.5)
NEUTROPHIL #: 2.91 10*3/uL (ref 1.50–7.70)
NEUTROPHIL %: 59 %
PLATELETS: 209 10*3/uL (ref 150–400)
RBC: 3.68 10*6/uL — ABNORMAL LOW (ref 3.85–5.22)
RDW-CV: 14.5 % (ref 11.5–15.5)
WBC: 4.8 10*3/uL (ref 3.7–11.0)

## 2020-09-08 LAB — URINALYSIS, MACROSCOPIC
BILIRUBIN: NEGATIVE mg/dL
BLOOD: NEGATIVE mg/dL
GLUCOSE: NORMAL mg/dL
KETONES: NEGATIVE mg/dL
LEUKOCYTES: NEGATIVE WBCs/uL
NITRITE: NEGATIVE
PH: 7 (ref 5.0–7.5)
PROTEIN: NEGATIVE mg/dL
SPECIFIC GRAVITY: 1.01 (ref 1.005–1.020)
UROBILINOGEN: <1 mg/dL (ref <=2.0)

## 2020-09-08 LAB — C-REACTIVE PROTEIN(CRP),INFLAMMATION: CRP INFLAMMATION: 0.8 mg/L (ref <8.0)

## 2020-09-08 LAB — LIPASE: LIPASE: 17 U/L (ref 10–60)

## 2020-09-08 MED ORDER — FLUTICASONE PROPIONATE 50 MCG/ACTUATION NASAL SPRAY,SUSPENSION
1.0000 | Freq: Every day | NASAL | 3 refills | Status: DC
Start: 2020-09-08 — End: 2022-07-20

## 2020-09-08 MED ORDER — EPINEPHRINE 0.3 MG/0.3 ML INJECTION, AUTO-INJECTOR
0.3000 mg | AUTO-INJECTOR | Freq: Once | INTRAMUSCULAR | 0 refills | Status: DC | PRN
Start: 2020-09-08 — End: 2023-01-24

## 2020-09-08 MED ORDER — ESCITALOPRAM 10 MG TABLET
10.0000 mg | ORAL_TABLET | Freq: Every day | ORAL | 3 refills | Status: DC
Start: 2020-09-08 — End: 2021-11-03

## 2020-09-08 MED ORDER — HYDROCODONE 5 MG-ACETAMINOPHEN 325 MG TABLET
1.0000 | ORAL_TABLET | Freq: Four times a day (QID) | ORAL | 0 refills | Status: DC | PRN
Start: 2020-09-08 — End: 2020-11-03
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
## 2020-09-09 LAB — CARBOHYDRATE ANTIGEN 19-9 (CA 19-9), SERUM: CARBOHYDRATE ANTIGEN 19-9: 12 U/mL (ref <37.0)

## 2020-09-10 ENCOUNTER — Ambulatory Visit
Admission: RE | Admit: 2020-09-10 | Discharge: 2020-09-10 | Disposition: A | Discharge status: Home / Self Care | Payer: Medicare Other | Source: Ambulatory Visit

## 2020-09-10 ENCOUNTER — Emergency Department: Payer: Medicare PPO

## 2020-09-10 ENCOUNTER — Emergency Department
Admission: EM | Admit: 2020-09-10 | Discharge: 2020-09-10 | Disposition: A | Discharge status: Home / Self Care | Payer: Medicare PPO | Attending: Emergency Medicine | Admitting: Emergency Medicine

## 2020-09-10 DIAGNOSIS — S37012A Minor contusion of left kidney, initial encounter: Secondary | ICD-10-CM | POA: Insufficient documentation

## 2020-09-10 DIAGNOSIS — R1032 Left lower quadrant pain: Secondary | ICD-10-CM | POA: Insufficient documentation

## 2020-09-10 DIAGNOSIS — X58XXXA Exposure to other specified factors, initial encounter: Secondary | ICD-10-CM | POA: Insufficient documentation

## 2020-09-10 DIAGNOSIS — N2889 Other specified disorders of kidney and ureter: Secondary | ICD-10-CM | POA: Insufficient documentation

## 2020-09-10 LAB — VH URINALYSIS WITH MICROSCOPIC AND CULTURE IF INDICATED
Bilirubin, UA: NEGATIVE
Blood, UA: NEGATIVE
Glucose, UA: NEGATIVE mg/dL
Ketones UA: NEGATIVE mg/dL
Leukocyte Esterase, UA: NEGATIVE Leu/uL
Nitrite, UA: NEGATIVE
Protein, UR: NEGATIVE mg/dL
Urine Specific Gravity: 1.01 (ref 1.001–1.040)
Urobilinogen, UA: NORMAL mg/dL
pH, Urine: 8 pH (ref 5.0–8.0)

## 2020-09-10 LAB — CBC AND DIFFERENTIAL
Basophils %: 0.6 % (ref 0.0–3.0)
Basophils Absolute: 0 10*3/uL (ref 0.0–0.3)
Eosinophils %: 3.1 % (ref 0.0–7.0)
Eosinophils Absolute: 0.2 10*3/uL (ref 0.0–0.8)
Hematocrit: 37.4 % (ref 36.0–48.0)
Hemoglobin: 12 gm/dL (ref 12.0–16.0)
Lymphocytes Absolute: 1.6 10*3/uL (ref 0.6–5.1)
Lymphocytes: 23.1 % (ref 15.0–46.0)
MCH: 32 pg (ref 28–35)
MCHC: 32 gm/dL (ref 32–36)
MCV: 101 fL — ABNORMAL HIGH (ref 80–100)
MPV: 9 fL (ref 6.0–10.0)
Monocytes Absolute: 0.6 10*3/uL (ref 0.1–1.7)
Monocytes: 8.3 % (ref 3.0–15.0)
Neutrophils %: 65 % (ref 42.0–78.0)
Neutrophils Absolute: 4.5 10*3/uL (ref 1.7–8.6)
PLT CT: 190 10*3/uL (ref 130–440)
RBC: 3.72 10*6/uL — ABNORMAL LOW (ref 3.80–5.00)
RDW: 12.7 % (ref 11.0–14.0)
WBC: 7 10*3/uL (ref 4.0–11.0)

## 2020-09-10 LAB — I-STAT CHEM 8 CARTRIDGE
Anion Gap I-Stat: 11 (ref 7.0–16.0)
BUN I-Stat: 28 mg/dL — ABNORMAL HIGH (ref 7–22)
Calcium Ionized I-Stat: 4.7 mg/dL (ref 4.35–5.10)
Chloride I-Stat: 105 mMol/L (ref 98–110)
Creatinine I-Stat: 0.6 mg/dL (ref 0.60–1.20)
EGFR: 92 mL/min/{1.73_m2} (ref 60–150)
Glucose I-Stat: 85 mg/dL (ref 71–99)
Hematocrit I-Stat: 39 % (ref 36.0–48.0)
Hemoglobin I-Stat: 13.3 gm/dL (ref 12.0–16.0)
Potassium I-Stat: 4.2 mMol/L (ref 3.5–5.3)
Sodium I-Stat: 139 mMol/L (ref 136–147)
TCO2 I-Stat: 28 mMol/L (ref 24–29)

## 2020-09-10 LAB — ETHANOL: Alcohol: <10 mg/dL (ref 0–9)

## 2020-09-10 LAB — LIPID PANEL
Cholesterol: 212 mg/dL — ABNORMAL HIGH (ref 75–199)
Coronary Heart Disease Risk: 2.49
HDL: 85 mg/dL — ABNORMAL HIGH (ref 45–65)
LDL Calculated: 116 mg/dL
Triglycerides: 55 mg/dL (ref 10–150)
VLDL: 11 (ref 0–40)

## 2020-09-10 LAB — VH I-STAT CHEM 8 NOTIFICATION

## 2020-09-10 LAB — LIPASE: Lipase: 15 U/L (ref 8–78)

## 2020-09-10 MED ORDER — IOHEXOL 350 MG/ML IV SOLN
100.0000 mL | Freq: Once | INTRAVENOUS | Status: AC | PRN
Start: 2020-09-10 — End: 2020-09-10
  Administered 2020-09-10: 100 mL via INTRAVENOUS

## 2020-09-10 MED ORDER — MORPHINE SULFATE 4 MG/ML IJ/IV SOLN (WRAP)
Status: AC
Start: 2020-09-10 — End: ?
  Filled 2020-09-10: qty 1

## 2020-09-10 MED ORDER — KETOROLAC TROMETHAMINE 30 MG/ML IJ SOLN
INTRAMUSCULAR | Status: AC
Start: 2020-09-10 — End: ?
  Filled 2020-09-10: qty 1

## 2020-09-10 MED ORDER — KETOROLAC TROMETHAMINE 30 MG/ML IJ SOLN
15.0000 mg | Freq: Once | INTRAMUSCULAR | Status: AC
Start: 2020-09-10 — End: 2020-09-10
  Administered 2020-09-10: 15 mg via INTRAVENOUS

## 2020-09-10 MED ORDER — MORPHINE SULFATE 4 MG/ML IJ/IV SOLN (WRAP)
2.0000 mg | Freq: Once | Status: AC
Start: 2020-09-10 — End: 2020-09-10
  Administered 2020-09-10: 2 mg via INTRAVENOUS

## 2020-09-10 MED ORDER — SODIUM CHLORIDE 0.9 % IV BOLUS
500.0000 mL | Freq: Once | INTRAVENOUS | Status: AC
Start: 2020-09-10 — End: 2020-09-10
  Administered 2020-09-10: 500 mL via INTRAVENOUS

## 2020-09-10 MED ORDER — ONDANSETRON HCL 4 MG/2ML IJ SOLN
4.0000 mg | Freq: Once | INTRAMUSCULAR | Status: AC
Start: 2020-09-10 — End: 2020-09-10
  Administered 2020-09-10: 4 mg via INTRAVENOUS

## 2020-09-10 MED ORDER — ONDANSETRON HCL 4 MG/2ML IJ SOLN
INTRAMUSCULAR | Status: AC
Start: 2020-09-10 — End: ?
  Filled 2020-09-10: qty 2
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
## 2020-09-13 ENCOUNTER — Ambulatory Visit (HOSPITAL_BASED_OUTPATIENT_CLINIC_OR_DEPARTMENT_OTHER): Payer: Medicare Other

## 2020-09-13 ENCOUNTER — Other Ambulatory Visit: Payer: Self-pay

## 2020-09-13 ENCOUNTER — Ambulatory Visit (INDEPENDENT_AMBULATORY_CARE_PROVIDER_SITE_OTHER): Payer: Medicare Other | Admitting: Urology

## 2020-09-13 ENCOUNTER — Encounter (INDEPENDENT_AMBULATORY_CARE_PROVIDER_SITE_OTHER): Payer: Self-pay | Admitting: Urology

## 2020-09-13 ENCOUNTER — Telehealth (INDEPENDENT_AMBULATORY_CARE_PROVIDER_SITE_OTHER): Payer: Self-pay | Admitting: Nurse Practitioner

## 2020-09-13 DIAGNOSIS — Z800 Family history of malignant neoplasm of digestive organs: Secondary | ICD-10-CM

## 2020-09-13 DIAGNOSIS — S37012S Minor contusion of left kidney, sequela: Secondary | ICD-10-CM

## 2020-09-13 DIAGNOSIS — S37012A Minor contusion of left kidney, initial encounter: Secondary | ICD-10-CM

## 2020-09-13 NOTE — PT Evaluation (Incomplete)
[facility],   [address] and [place], [place]  [address]  ([phone]   [phone]    Physical Therapy Initial Evaluation    Patient Name:  [name]  DOB:  1950-09-05    Start of Care:   09/14/20  Diagnosis:  Left knee PF syndrome   Onset of Injury:  Chronic     History of Injury:    Per FOTO Intake:  Intake score:  61,    Complexity level:  Moderate     PMH:     Past Medical History:   Diagnosis Date   . Breast lump 2005    Resolved prior to biopsy   . Detached retina    . Esophageal reflux    . Hx of migraines    . Iron deficiency anemia    . LLQ abdominal pain    . Migraine    . Osteoporosis    . Pancytopenia    . Prolapsed uterus      Past Surgical History:   Procedure Laterality Date   . HX BREAST BIOPSY Bilateral     benign    . HX CESAREAN SECTION  1993   . HX COLONOSCOPY     . HX ENDOSCOPIC SINUS SURGERY     . HX TONSILLECTOMY  1955     Social:    Married.    Imaging Studies Performed:    08/04/20:   X-ray IMPRESSION:    1. Mild tricompartment osteoarthritic degenerative changes.  2. No evidence of acute fracture.   Medications:     Current Outpatient Medications   Medication Sig   . calcium-vits D3-C-K2-minerals 166.75 mg- 166.75 unit Oral Capsule calcium (Patient not taking: Reported on 07/14/2020)   . coenzyme Q10 300 mg Oral Capsule Take 300 mg by mouth (Patient not taking: No sig reported)   . diclofenac sodium (VOLTAREN) 1 % Gel Apply topically Three times a day   . EPINEPHrine 0.3 mg/0.3 mL Injection Auto-Injector Inject 0.3 mL (0.3 mg total) into the muscle Once, as needed   . escitalopram oxalate (LEXAPRO) 10 mg Oral Tablet Take 1 Tablet (10 mg total) by mouth Once a day   . fluticasone propionate (FLONASE) 50 mcg/actuation Nasal Spray, Suspension Administer 1 Spray into each nostril Once a day   . galcanezumab-gnlm (EMGALITY SYRINGE SUBQ) by Subcutaneous route   . Glucosamine HCl 1,500 mg Oral Tablet Take by  mouth (Patient not taking: No sig reported)   . HYDROcodone-acetaminophen (NORCO) 5-325 mg Oral Tablet Take 1-2 Tablets by mouth Every 6 hours as needed for Pain   . linaCLOtide (LINZESS) 145 mcg Oral Capsule Take 1 Capsule (145 mcg total) by mouth Every morning (Patient not taking: Reported on 07/14/2020)   . magnesium oxide 400 mg Oral Tablet Take 400 mg by mouth (Patient not taking: Reported on 07/14/2020)   . mupirocin (BACTROBAN) 2 % Ointment by Apply Topically route Twice daily (Patient not taking: No sig reported)   . MYRBETRIQ 50 mg Oral Tablet Sustained Release 24 hr Take 1 Tablet by mouth Once a day   . OMEGA-3 FATTY ACIDS-FISH OIL ORAL Take 1,000 mg by mouth (Patient not taking: Reported on 07/14/2020)   . REYVOW 50 mg Oral Tablet      ROM:    Right knee:  Left knee:   Strength:    Right LE:  Left LE:  Pain:  4-5/10(best)  8/10(worst)  Better:   ***  Worse:   ***    Girth:    Mid-patella:    Right=    Left=   Sensation:      Gait:       Posture:     FH, bilateral rounded shoulders, flexed posture.   Palpation:      Other:   ***    Assessment:  ***Patient would benefit from further PT to address the following problems:  . Restricted ROM  . Decreased strength  . Decreased functional activities  . Loss of endurance  . Pain limiting function  . Swelling  . Abnormal gait pattern requiring assistive device  . Decreased balance  . Radicular complaints  ***    STG's:  (2 weeks):     1.  Patient will be independent with HEP for ROM/flexibility and strengthening for increased ease of ADL's.     LTG's:  (4 weeks):  Pt will:  ***    Treatment may include:  . Flexibility/ROM and strengthening exercises, HEP  . Therapeutic exercise  . Joint mobilization  . Manual therapy  . Manual traction  . Mechanical traction  . Electrical stimulation  . Ultrasound  . Moist Heat   . Ice  ***    Patient will be seen 3x/week x4 weeks to work on goals stated above.  The patient has been advised of his/her PT diagnosis, goals and POC.    Yes  The patient/family has given verbal consent for evaluation and treatment.   Yes    Total Evaluation Time: ***  Total Timed Treatment Minutes: ***  Total Treatment Time: ***      Lucille Passy, MPT          ____________________________________________  Physician Signature    Date
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
## 2020-09-14 ENCOUNTER — Encounter (HOSPITAL_BASED_OUTPATIENT_CLINIC_OR_DEPARTMENT_OTHER): Payer: Self-pay

## 2020-09-14 ENCOUNTER — Other Ambulatory Visit (HOSPITAL_BASED_OUTPATIENT_CLINIC_OR_DEPARTMENT_OTHER): Payer: Self-pay

## 2020-09-14 ENCOUNTER — Ambulatory Visit
Admission: RE | Admit: 2020-09-14 | Discharge: 2020-09-14 | Disposition: A | Discharge status: Still In Hospital | Payer: Medicare Other | Source: Ambulatory Visit | Attending: Orthopaedic Surgery | Admitting: Orthopaedic Surgery

## 2020-09-14 ENCOUNTER — Ambulatory Visit (INDEPENDENT_AMBULATORY_CARE_PROVIDER_SITE_OTHER): Payer: Medicare PPO | Admitting: Surgery

## 2020-09-14 VITALS — BP 104/74 | HR 79 | Temp 98.1°F

## 2020-09-14 DIAGNOSIS — N2889 Other specified disorders of kidney and ureter: Secondary | ICD-10-CM | POA: Insufficient documentation

## 2020-09-14 DIAGNOSIS — R109 Unspecified abdominal pain: Secondary | ICD-10-CM

## 2020-09-14 DIAGNOSIS — S37012A Minor contusion of left kidney, initial encounter: Secondary | ICD-10-CM

## 2020-09-14 HISTORY — DX: Minor contusion of left kidney, initial encounter: S37.012A
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
## 2020-09-14 NOTE — Assessment & Plan Note (Signed)
It is unclear what the patient's current pathology is or the cause of her left-sided pain based on current available imaging.  If she does have active bleeding into his days, and she may have developed an arterial venous malformation and may require Interventional Radiology to embolize.  She was counseled to present immediately to the emergency department if she develops any symptoms of bleeding including shortness of breath, chest pain, rapid heart rate, however, or any other concerning symptoms.  They report describes with lesion of the distal pancreas, so I advised him to follow-up with her trauma surgeons to evaluate this.  We will obtain her CT scans from the outside hospital and have her follow-up for further evaluation.

## 2020-09-14 NOTE — Patient Instructions (Signed)
We will call you with the results of the ultrasound  The referral was sent to the urology office in [place], if you do not hear from them by Monday please give their office a call-  Urologist ([address] [place], [place], [phone])  Call office if you have any questions or concerns
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
## 2020-09-23 ENCOUNTER — Telehealth (INDEPENDENT_AMBULATORY_CARE_PROVIDER_SITE_OTHER): Payer: Self-pay | Admitting: Physician Assistant

## 2020-09-23 ENCOUNTER — Ambulatory Visit (INDEPENDENT_AMBULATORY_CARE_PROVIDER_SITE_OTHER)
Admission: RE | Admit: 2020-09-23 | Discharge: 2020-09-23 | Disposition: A | Discharge status: Home / Self Care | Payer: Medicare PPO | Source: Ambulatory Visit | Attending: Family | Admitting: Family

## 2020-09-23 DIAGNOSIS — S37032D Laceration of left kidney, unspecified degree, subsequent encounter: Secondary | ICD-10-CM

## 2020-09-23 DIAGNOSIS — R109 Unspecified abdominal pain: Secondary | ICD-10-CM

## 2020-09-23 NOTE — Telephone Encounter (Signed)
[facility] clinic and spoke with office secretary who stated she will call the patient and get her an appointment as soon as possible.     Manuela Neptune, PA-C
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
## 2020-09-24 ENCOUNTER — Other Ambulatory Visit
Admission: RE | Admit: 2020-09-24 | Discharge: 2020-09-24 | Disposition: A | Discharge status: Home / Self Care | Payer: Medicare PPO | Source: Ambulatory Visit | Attending: Physician Assistant | Admitting: Physician Assistant

## 2020-09-24 DIAGNOSIS — S37032D Laceration of left kidney, unspecified degree, subsequent encounter: Secondary | ICD-10-CM

## 2020-09-24 LAB — CBC AND DIFFERENTIAL
Basophils %: 0.4 % (ref 0.0–3.0)
Basophils Absolute: 0 10*3/uL (ref 0.0–0.3)
Eosinophils %: 2.7 % (ref 0.0–7.0)
Eosinophils Absolute: 0.1 10*3/uL (ref 0.0–0.8)
Hematocrit: 33.5 % — ABNORMAL LOW (ref 36.0–48.0)
Hemoglobin: 10.6 gm/dL — ABNORMAL LOW (ref 12.0–16.0)
Lymphocytes Absolute: 1.3 10*3/uL (ref 0.6–5.1)
Lymphocytes: 26.6 % (ref 15.0–46.0)
MCH: 32 pg (ref 28–35)
MCHC: 32 gm/dL (ref 32–36)
MCV: 100 fL (ref 80–100)
MPV: 9.5 fL (ref 6.0–10.0)
Monocytes Absolute: 0.3 10*3/uL (ref 0.1–1.7)
Monocytes: 6.9 % (ref 3.0–15.0)
Neutrophils %: 63.4 % (ref 42.0–78.0)
Neutrophils Absolute: 3 10*3/uL (ref 1.7–8.6)
PLT CT: 179 10*3/uL (ref 130–440)
RBC: 3.34 10*6/uL — ABNORMAL LOW (ref 3.80–5.00)
RDW: 12.4 % (ref 11.0–14.0)
WBC: 4.8 10*3/uL (ref 4.0–11.0)

## 2020-09-24 LAB — BASIC METABOLIC PANEL
Anion Gap: 12.2 mMol/L (ref 7.0–18.0)
BUN / Creatinine Ratio: 38.2 Ratio — ABNORMAL HIGH (ref 10.0–30.0)
BUN: 26 mg/dL — ABNORMAL HIGH (ref 7–22)
CO2: 29 mMol/L (ref 20–30)
Calcium: 9.2 mg/dL (ref 8.5–10.5)
Chloride: 103 mMol/L (ref 98–110)
Creatinine: 0.68 mg/dL (ref 0.60–1.20)
EGFR: 88 mL/min/{1.73_m2} (ref 60–150)
Glucose: 116 mg/dL — ABNORMAL HIGH (ref 71–99)
Osmolality Calculated: 285 mOsm/kg (ref 275–300)
Potassium: 4.2 mMol/L (ref 3.5–5.3)
Sodium: 140 mMol/L (ref 136–147)

## 2020-09-29 ENCOUNTER — Telehealth (INDEPENDENT_AMBULATORY_CARE_PROVIDER_SITE_OTHER): Payer: Self-pay | Admitting: Physician Assistant

## 2020-09-29 NOTE — Telephone Encounter (Signed)
Called patient to discuss lab results. Decrease in H/H noted. Patient denies dizziness, heart palpitations. She reports residual left CVA tenderness, unchanged. She has follow up with urology scheduled for mid-May. Advised patient to follow up with urology as planned for further management of left kidney (complex posttraumatic hematoma vs. Occult malignancy). I also advised the patient to go to the ER if her pain significantly worsens in the interim, or she develops dizziness or heart palpitations.    Nicole Neptune, PA-C

## 2020-10-05 ENCOUNTER — Ambulatory Visit (INDEPENDENT_AMBULATORY_CARE_PROVIDER_SITE_OTHER): Payer: Medicare Other | Admitting: Internal Medicine
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
## 2020-10-21 ENCOUNTER — Encounter (INDEPENDENT_AMBULATORY_CARE_PROVIDER_SITE_OTHER): Payer: Self-pay | Admitting: Urology

## 2020-10-21 ENCOUNTER — Ambulatory Visit (INDEPENDENT_AMBULATORY_CARE_PROVIDER_SITE_OTHER): Payer: Medicare PPO | Admitting: Urology

## 2020-10-21 DIAGNOSIS — N289 Disorder of kidney and ureter, unspecified: Secondary | ICD-10-CM

## 2020-10-25 NOTE — Addendum Note (Signed)
Addended by: Harrold Donath on: 10/25/2020 09:10 AM     Modules accepted: Orders

## 2020-10-27 ENCOUNTER — Other Ambulatory Visit: Payer: Self-pay

## 2020-11-03 ENCOUNTER — Ambulatory Visit (INDEPENDENT_AMBULATORY_CARE_PROVIDER_SITE_OTHER): Payer: Medicare Other | Admitting: Internal Medicine

## 2020-11-03 ENCOUNTER — Encounter (INDEPENDENT_AMBULATORY_CARE_PROVIDER_SITE_OTHER): Payer: Self-pay | Admitting: Internal Medicine

## 2020-11-03 ENCOUNTER — Other Ambulatory Visit: Payer: Self-pay

## 2020-11-03 ENCOUNTER — Ambulatory Visit (INDEPENDENT_AMBULATORY_CARE_PROVIDER_SITE_OTHER): Payer: Self-pay | Admitting: Internal Medicine

## 2020-11-03 VITALS — BP 110/70 | HR 71 | Temp 97.5°F | Ht 67.5 in | Wt 158.8 lb

## 2020-11-03 DIAGNOSIS — Z01818 Encounter for other preprocedural examination: Secondary | ICD-10-CM

## 2020-11-03 DIAGNOSIS — R109 Unspecified abdominal pain: Secondary | ICD-10-CM

## 2020-11-03 DIAGNOSIS — S37012S Minor contusion of left kidney, sequela: Secondary | ICD-10-CM

## 2020-11-03 DIAGNOSIS — F419 Anxiety disorder, unspecified: Secondary | ICD-10-CM

## 2020-11-03 DIAGNOSIS — G43009 Migraine without aura, not intractable, without status migrainosus: Secondary | ICD-10-CM

## 2020-11-03 NOTE — Nursing Note (Signed)
BP 110/70   Pulse 71   Temp 36.4 C (97.5 F) (Thermal Scan)   Ht 1.715 m (5' 7.5")   Wt 72 kg (158 lb 12.8 oz)   SpO2 98%   BMI 24.50 kg/m     Jonni Sanger, MA  11/03/2020, 13:17
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
## 2020-11-26 ENCOUNTER — Ambulatory Visit (INDEPENDENT_AMBULATORY_CARE_PROVIDER_SITE_OTHER): Payer: Medicare Other | Admitting: Internal Medicine

## 2020-11-26 ENCOUNTER — Other Ambulatory Visit: Payer: Self-pay

## 2020-11-26 ENCOUNTER — Encounter (INDEPENDENT_AMBULATORY_CARE_PROVIDER_SITE_OTHER): Payer: Self-pay | Admitting: Internal Medicine

## 2020-11-26 VITALS — BP 114/58 | HR 70 | Temp 96.9°F | Ht 67.5 in | Wt 162.8 lb

## 2020-11-26 DIAGNOSIS — S37012S Minor contusion of left kidney, sequela: Secondary | ICD-10-CM

## 2020-11-26 DIAGNOSIS — G43009 Migraine without aura, not intractable, without status migrainosus: Secondary | ICD-10-CM

## 2020-11-26 MED ORDER — EMGALITY 120 MG/ML SUBCUTANEOUS SYRINGE
INJECTION | SUBCUTANEOUS | 0 refills | Status: DC
Start: 2020-11-26 — End: 2023-10-12

## 2020-11-26 MED ORDER — LIDOCAINE 5 % TOPICAL OINTMENT
TOPICAL_OINTMENT | Freq: Three times a day (TID) | CUTANEOUS | 3 refills | Status: DC | PRN
Start: 2020-11-26 — End: 2022-02-22
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
## 2020-11-26 NOTE — Nursing Note (Signed)
BP (!) 114/58 (Site: Manual, Patient Position: Sitting, Cuff Size: Adult)   Pulse 70   Temp 36.1 C (96.9 F) (Thermal Scan)   Ht 1.715 m (5' 7.5")   Wt 73.8 kg (162 lb 12.8 oz)   SpO2 99%   BMI 25.12 kg/m     Riley Nearing, RN  11/26/2020, 10:50

## 2020-11-26 NOTE — Nursing Note (Signed)
11/26/20 1048   Fall Risk Assessment   Do you feel unsteady when standing or walking? No   Do you worry about falling? No   Have you fallen in the past year? No

## 2020-12-21 ENCOUNTER — Ambulatory Visit: Payer: Medicare Other | Attending: UROLOGY

## 2020-12-21 ENCOUNTER — Other Ambulatory Visit: Payer: Self-pay

## 2020-12-21 DIAGNOSIS — D447 Neoplasm of uncertain behavior of aortic body and other paraganglia: Secondary | ICD-10-CM | POA: Insufficient documentation

## 2020-12-21 DIAGNOSIS — N2889 Other specified disorders of kidney and ureter: Secondary | ICD-10-CM | POA: Insufficient documentation

## 2020-12-21 LAB — BASIC METABOLIC PANEL
ANION GAP: 5 mmol/L (ref 4–13)
BUN/CREA RATIO: 28 — ABNORMAL HIGH (ref 6–22)
BUN: 19 mg/dL (ref 8–25)
CALCIUM: 9.1 mg/dL (ref 8.8–10.2)
CHLORIDE: 107 mmol/L (ref 96–111)
CO2 TOTAL: 28 mmol/L (ref 23–31)
CREATININE: 0.67 mg/dL (ref 0.60–1.05)
ESTIMATED GFR: >90 mL/min/BSA (ref >=60)
GLUCOSE: 91 mg/dL (ref 65–125)
POTASSIUM: 4.3 mmol/L (ref 3.5–5.1)
SODIUM: 140 mmol/L (ref 136–145)

## 2020-12-21 LAB — CBC W/NO DIFF
HCT: 35.6 % (ref 34.8–46.0)
HGB: 11 g/dL — ABNORMAL LOW (ref 11.5–16.0)
MCH: 29.3 pg (ref 26.0–32.0)
MCHC: 30.9 g/dL — ABNORMAL LOW (ref 31.0–35.5)
MCV: 94.9 fL (ref 78.0–100.0)
MPV: 10.9 fL (ref 8.7–12.5)
PLATELETS: 176 10*3/uL (ref 150–400)
RBC: 3.75 10*6/uL — ABNORMAL LOW (ref 3.85–5.22)
RDW-CV: 15.7 % — ABNORMAL HIGH (ref 11.5–15.5)
WBC: 3.8 10*3/uL (ref 3.7–11.0)

## 2020-12-21 LAB — PT/INR
INR: 1.04
PROTHROMBIN TIME: 11.3 seconds (ref 9.2–12.3)

## 2020-12-26 LAB — METANEPHRINES, FRACTIONATED, RANDOM, URINE
CREATININE, RANDOM URINE: 54 mg/dL (ref 20–275)
METANEPHRINE: 79 ug/g{creat} (ref 21–153)
METANEPHRINES, TOTAL: 280 ug/g{creat} (ref 149–603)
NORMETANEPHRINE: 201 ug/g{creat} (ref 108–524)

## 2020-12-26 LAB — METANEPHRINES, FRACTIONATED, FREE, PLASMA
METANEPHRINE, FREE: <25 pg/mL (ref <=57)
NORMETANEPHRINE, FREE: 60 pg/mL (ref <=148)
TOTAL, FREE (MN + NMN): 60 pg/mL (ref <=205)

## 2021-01-07 ENCOUNTER — Ambulatory Visit: Payer: Medicare Other | Attending: UROLOGY

## 2021-01-07 ENCOUNTER — Other Ambulatory Visit: Payer: Self-pay

## 2021-01-07 DIAGNOSIS — N2889 Other specified disorders of kidney and ureter: Secondary | ICD-10-CM | POA: Insufficient documentation

## 2021-01-07 LAB — BASIC METABOLIC PANEL
ANION GAP: 6 mmol/L (ref 4–13)
BUN/CREA RATIO: 33 — ABNORMAL HIGH (ref 6–22)
BUN: 28 mg/dL — ABNORMAL HIGH (ref 8–25)
CALCIUM: 9.1 mg/dL (ref 8.8–10.2)
CHLORIDE: 108 mmol/L (ref 96–111)
CO2 TOTAL: 28 mmol/L (ref 23–31)
CREATININE: 0.86 mg/dL (ref 0.60–1.05)
ESTIMATED GFR: 72 mL/min/BSA (ref >=60)
GLUCOSE: 122 mg/dL (ref 65–125)
POTASSIUM: 4.1 mmol/L (ref 3.5–5.1)
SODIUM: 142 mmol/L (ref 136–145)

## 2021-01-07 LAB — CBC W/NO DIFF
HCT: 34.4 % — ABNORMAL LOW (ref 34.8–46.0)
HGB: 10 g/dL — ABNORMAL LOW (ref 11.5–16.0)
MCH: 27.7 pg (ref 26.0–32.0)
MCHC: 29.1 g/dL — ABNORMAL LOW (ref 31.0–35.5)
MCV: 95.3 fL (ref 78.0–100.0)
MPV: 11 fL (ref 8.7–12.5)
PLATELETS: 173 10*3/uL (ref 150–400)
RBC: 3.61 10*6/uL — ABNORMAL LOW (ref 3.85–5.22)
RDW-CV: 16.5 % — ABNORMAL HIGH (ref 11.5–15.5)
WBC: 4.5 10*3/uL (ref 3.7–11.0)

## 2021-01-07 LAB — PT/INR
INR: 1.04
PROTHROMBIN TIME: 11.3 seconds (ref 9.2–12.3)

## 2021-01-21 ENCOUNTER — Other Ambulatory Visit: Payer: Self-pay | Admitting: Neurology

## 2021-01-21 ENCOUNTER — Encounter: Payer: Self-pay | Admitting: Neurology

## 2021-01-21 DIAGNOSIS — R270 Ataxia, unspecified: Secondary | ICD-10-CM

## 2021-01-21 DIAGNOSIS — R519 Headache, unspecified: Secondary | ICD-10-CM

## 2021-01-24 ENCOUNTER — Telehealth (INDEPENDENT_AMBULATORY_CARE_PROVIDER_SITE_OTHER): Payer: Self-pay | Admitting: Internal Medicine

## 2021-01-24 NOTE — Telephone Encounter (Signed)
Patient called and was requesting you look at her lab work from [facility]. States her Hemoglobin is low and she is requesting an Iron IV Infusion before she has her procedure done.     [name]  01/24/2021, 16:20

## 2021-01-28 ENCOUNTER — Ambulatory Visit
Admission: RE | Admit: 2021-01-28 | Discharge: 2021-01-28 | Disposition: A | Discharge status: Home / Self Care | Payer: Medicare PPO | Source: Ambulatory Visit | Attending: Neurology | Admitting: Neurology

## 2021-01-28 DIAGNOSIS — R519 Headache, unspecified: Secondary | ICD-10-CM | POA: Insufficient documentation

## 2021-01-28 DIAGNOSIS — R270 Ataxia, unspecified: Secondary | ICD-10-CM | POA: Insufficient documentation

## 2021-01-31 ENCOUNTER — Ambulatory Visit
Admission: RE | Admit: 2021-01-31 | Discharge: 2021-01-31 | Disposition: A | Discharge status: Home / Self Care | Payer: Medicare PPO | Source: Ambulatory Visit | Attending: Gastroenterology | Admitting: Gastroenterology

## 2021-01-31 ENCOUNTER — Encounter (HOSPITAL_BASED_OUTPATIENT_CLINIC_OR_DEPARTMENT_OTHER)
Admission: RE | Disposition: A | Discharge status: Home / Self Care | Payer: Self-pay | Source: Ambulatory Visit | Attending: Gastroenterology

## 2021-01-31 ENCOUNTER — Ambulatory Visit (HOSPITAL_BASED_OUTPATIENT_CLINIC_OR_DEPARTMENT_OTHER): Payer: Medicare PPO | Admitting: Anesthesiology

## 2021-01-31 ENCOUNTER — Encounter (HOSPITAL_BASED_OUTPATIENT_CLINIC_OR_DEPARTMENT_OTHER): Payer: Self-pay | Admitting: Gastroenterology

## 2021-01-31 DIAGNOSIS — Z01818 Encounter for other preprocedural examination: Secondary | ICD-10-CM | POA: Insufficient documentation

## 2021-01-31 DIAGNOSIS — K648 Other hemorrhoids: Secondary | ICD-10-CM | POA: Insufficient documentation

## 2021-01-31 DIAGNOSIS — K5730 Diverticulosis of large intestine without perforation or abscess without bleeding: Secondary | ICD-10-CM | POA: Insufficient documentation

## 2021-01-31 DIAGNOSIS — C480 Malignant neoplasm of retroperitoneum: Secondary | ICD-10-CM | POA: Insufficient documentation

## 2021-01-31 HISTORY — PX: COLONOSCOPY: SHX174

## 2021-01-31 SURGERY — DONT USE, USE 1094-COLONOSCOPY, DIAGNOSTIC (SCREENING)
Anesthesia: Anesthesia MAC / Sedation | Site: Anus | Wound class: Clean Contaminated

## 2021-01-31 MED ORDER — SODIUM CHLORIDE (PF) 0.9 % IJ SOLN
3.0000 mL | Freq: Two times a day (BID) | INTRAMUSCULAR | Status: DC
Start: 2021-01-31 — End: 2021-01-31

## 2021-01-31 MED ORDER — ONDANSETRON 4 MG PO TBDP
4.0000 mg | ORAL_TABLET | Freq: Once | ORAL | Status: DC | PRN
Start: 2021-01-31 — End: 2021-01-31

## 2021-01-31 MED ORDER — ALBUTEROL SULFATE (2.5 MG/3ML) 0.083% IN NEBU
2.5000 mg | INHALATION_SOLUTION | RESPIRATORY_TRACT | Status: DC | PRN
Start: 2021-01-31 — End: 2021-01-31

## 2021-01-31 MED ORDER — PROPOFOL 200 MG/20ML IV EMUL
INTRAVENOUS | Status: DC | PRN
Start: 2021-01-31 — End: 2021-01-31
  Administered 2021-01-31: 25 mg via INTRAVENOUS
  Administered 2021-01-31: 75 mg via INTRAVENOUS
  Administered 2021-01-31 (×2): 25 mg via INTRAVENOUS
  Administered 2021-01-31: 50 mg via INTRAVENOUS
  Administered 2021-01-31 (×4): 25 mg via INTRAVENOUS

## 2021-01-31 MED ORDER — ONDANSETRON HCL 4 MG/2ML IJ SOLN
4.0000 mg | Freq: Once | INTRAMUSCULAR | Status: DC | PRN
Start: 2021-01-31 — End: 2021-01-31

## 2021-01-31 MED ORDER — PROPOFOL 200 MG/20ML IV EMUL
INTRAVENOUS | Status: AC
Start: 2021-01-31 — End: ?
  Filled 2021-01-31: qty 20

## 2021-01-31 MED ORDER — SODIUM CHLORIDE 0.9 % IV SOLN
INTRAVENOUS | Status: DC
Start: 2021-01-31 — End: 2021-01-31

## 2021-01-31 SURGICAL SUPPLY — 37 items
AGENT SUBMUCOSAL INJECTION (Supply) IMPLANT
BRUSH CLEANING COMBINATION (Supply) ×1
BRUSH HEDGEHOG DUAL END (Supply) ×1 IMPLANT
CATH GLD PROBE BICAP 7FX210CM (Supply) IMPLANT
CATH GLD PROBE BICAP 7FX300CM (Supply) IMPLANT
CATH GOLD PROBE 10FR (Supply) IMPLANT
CATH GOLD PROBE INJECTION 10F (Supply) IMPLANT
CLEANER ENZYMATIC BEDSIDE (Supply) ×2 IMPLANT
CLIP LIGATING RES 360 ULT 17MM (Supply) IMPLANT
CLIP RESOLUTION 360 (Supply) IMPLANT
CONNECTOR QUICK PORT (Supply) ×2 IMPLANT
CRE PYLORIC COL 10-12 DIL 5847 (Supply) IMPLANT
CRE PYLORIC COL 12-15 DIL 5848 (Supply) IMPLANT
CRE PYLORIC COL 15-18 DIL 5849 (Supply) IMPLANT
CRE PYLORIC COL 8-10 DIL 5846 (Supply) IMPLANT
DEVICE STERIFLATE INFLATION (Supply) IMPLANT
DIL CRE 18-20 PYL CLN 5850 (Supply) IMPLANT
ENDOCUFF VISION LG GREEN 11.2 (Supply) IMPLANT
FORCEP BIOPSY HOT RAD JAW 4 (Supply) IMPLANT
FORCEP BIOPSY RAD JAW 1333-40 (Supply) IMPLANT
FORCEP RADIAL JAW JUMBO 240CM (Supply) IMPLANT
HEMOSTAT ENDO HEMOSPRAY 5G (Supply) IMPLANT
HEMOSTAT ENDO HEMOSPRAY 7FR (Supply)
KIT SYR ORISE 10ML (Supply)
MARKER ENDOSCOPIC SPOT (Supply) IMPLANT
NDL INTERJECT SCLERO 25G (Supply) IMPLANT
PAD CLINCH ENDO TRASPORT (Supply) ×2 IMPLANT
PROBE CIRCUMFRENTIAL (Supply) IMPLANT
PROBE SIDE FIRE (Supply) IMPLANT
PROBE STRAIGHT FIRE APC (Supply) IMPLANT
SNARE ENDO CAP RND 10MM COLD (Supply) IMPLANT
SNARE ENDO CAP2 RND 10MM LOOP (Supply) IMPLANT
SNARE LARGE CAPTIV OVAL 6131 (Supply) IMPLANT
SNARE ROTATE SM OVAL MED STFF (Supply) IMPLANT
SNARE SMALL CAPTIV 6230 (Supply) IMPLANT
VALVE DISP CLEANING BIOGUARD (Supply) ×2 IMPLANT
VALVE OLYMPUS DISP A/W/S/ BIO (Supply) ×2 IMPLANT

## 2021-01-31 NOTE — Anesthesia Preprocedure Evaluation (Signed)
Anesthesia Evaluation    AIRWAY    Mallampati: III    TM distance: >3 FB  Neck ROM: full  Mouth Opening:limited  Planned to use difficult airway equipment: No CARDIOVASCULAR    cardiovascular exam normal       DENTAL           PULMONARY    pulmonary exam normal and clear to auscultation     OTHER FINDINGS                  Relevant Problems   No relevant active problems       PSS Anesthesia Comments: The chart and history were reviewed with the patient, including but not limited to:    Denied CP    Denied smoking and OSA    Denied anti-coag    Risks and complications of the anesthetic including but not limited to neurological, cardiovascular, pulmonary complications, intraoperative awareness, dental trauma, allergic reaction, death discussed and fully informed consent obtained from patient and/or legal guardian.          Anesthesia Plan    ASA 2     MAC                     intravenous induction               informed consent obtained      pertinent labs reviewed  imaging results reviewed           Signed by: Janett Labella, MD 01/31/21 7:21 AM

## 2021-01-31 NOTE — Anesthesia Postprocedure Evaluation (Signed)
Post Operative Evaluation    Patient: Nicole Sutton    Procedures performed: Procedure(s):  COLONOSCOPY    Patient location: PACU    Last vitals:   Vitals:    01/31/21 0845   BP: 129/74   Pulse:    Resp: 16   Temp:    SpO2: 100%       BP: 129/74   Temp: 36.3 C (97.3 F)   Heart Rate: 77   Resp Rate: 16        Post pain: Pain control adequate, continue current therapy     Mental Status:  Awake, alert    Respiratory Function: room air    Cardiovascular: stable    Nausea/Vomiting: therapy adequate; appropriate medications received    Hydration Status: adequate    Post assessment: no apparent anesthetic complications, no reportable events and no evidence of recall

## 2021-01-31 NOTE — Transfer of Care (Signed)
Anesthesia Transfer of Care Note    Patient: Nicole Sutton    Last vitals:   Vitals:    01/31/21 0802   BP: 105/64   Pulse: 77   Resp: 12   Temp: 36.3 C (97.3 F)   SpO2: 96%       Oxygen: Nasal Cannula     Mental Status:sedated    Airway: Natural    Cardiovascular Status:  stable

## 2021-01-31 NOTE — Discharge Instr - AVS First Page (Signed)
Endoscopy Discharge Instructions      COVID  Thank you for allowing us to provide you care during this evolving time. For questions regarding your test results we encourage you to call the office of the physician who performed your exam.     After you leave the hospital:  Due to the effects of the sedatives, you may feel tired for the remainder of today.   We recommend you go home after discharge  It is advisable to have supervision or access to seek help for a few hours after discharge  Do not drive, operate machinery, or sign important documents until tomorrow.  Resume your normal activities / work tomorrow  Please rest and drink extra fluids today.    Avoid alcohol today.  Unless otherwise guided under the recommendation section of your procedure report, start with light foods and advance to your regular diet as tolerated.  Refer to the recommendation section of the procedure report for medication information.     When to seek help -     Nausea / Vomiting: - try small amounts of bland foods like crackers or toast, & liquids such as soda, electrolyte replacement drinks or ice chips. Call for:  New onset or ongoing nausea and vomiting   The symptoms worsen - cold skin, confusion, blood or unexplainable black appearing vomit  Bleeding:  Passing of blood or clots  or a change in stool consistency and or black in color  Vomiting or spitting up blood  Infection:  Redness or swelling at the IV site that continues to worsen. Initial warm compress may help.  Fever  Pain / other:  New onset of pain that does not subside over time or prevents you from normal activity or eating  Shortness of breath or breathing trouble  Weakness, feeling faint, passing out  Swollen or distended abdomen    During business hours call your physician’s office.   After-hours call [facility] [phone] and a physician will be contacted

## 2021-02-01 ENCOUNTER — Encounter (HOSPITAL_BASED_OUTPATIENT_CLINIC_OR_DEPARTMENT_OTHER): Payer: Self-pay | Admitting: Gastroenterology

## 2021-02-03 ENCOUNTER — Encounter (INDEPENDENT_AMBULATORY_CARE_PROVIDER_SITE_OTHER): Payer: Self-pay

## 2021-03-02 ENCOUNTER — Encounter (INDEPENDENT_AMBULATORY_CARE_PROVIDER_SITE_OTHER): Payer: Self-pay | Admitting: Internal Medicine

## 2021-07-05 NOTE — Progress Notes (Signed)
Patient ID: [name] is a 71 y.o. female.  Referring Physician: Janett Labella, MD  [phone]  [address]  [place],  [place] [address]  Primary Care Provider: No primary care provider on file.    @     ..  Oncology History    No history exists.        Subjective    In January 2022 she had a traumatic episode.  Imaging demonstrated fracture of the body of the sternum, pulmonary contusion, and left kidney laceration.      Follow-up imaging in April 2022 showed a 4.5 x 3.6 x 5.3 cm heterogenous collection the previous site of the hematoma.  This is ultrasound evaluated in April 2022 and had noted to be enlarged to 7 cm.    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    A question was originally raised regarding neoadjuvant therapy.  It was elected, however, to move straight to surgery.    On March 04, 2021 she underwent resection of this mass including nephrectomy, colectomy, pancreatectomy, and splenectomy.    Subsequent to that surgery, she has had some ongoing issues with anemia.  Hemoglobins in December 2022 in January 2023 were 9.7 and 9.8 respectively.  MCV and MCH were normal.  White count and platelet counts were normal.    Renal function was normal.    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  She was placed on anticoagulation was subsequently stopped after 3 months.    Energy status was initially significantly improved.  Lately, however, she has been noticing some increasing issues with fatigue.  When asked, however, she no significant sleep disturbances.    She has not received any of her postsplenectomy vaccines.      Cancer-related family history is not on file.       Review of Systems   Constitutional: Positive for fatigue. Negative for appetite change, fever and unexpected weight change.   HENT:   Negative for hearing loss, trouble  swallowing and voice change.    Eyes: Negative for eye problems.   Respiratory: Negative for chest tightness, cough and shortness of breath.    Cardiovascular: Negative for chest pain and palpitations.   Gastrointestinal: Negative for abdominal distention and abdominal pain.   Genitourinary: Negative for dysuria, frequency and hematuria.    Musculoskeletal: Negative for gait problem.   Skin: Negative for itching and rash.   Neurological: Negative for dizziness, gait problem, headaches and light-headedness.   Hematological: Does not bruise/bleed easily.        Objective   BSA: There is no height or weight on file to calculate BSA.  BP 116/60 (BP Location: Right arm, Patient Position: Sitting)   Pulse 61   Temp 97.7 F (36.5 C) (Oral)   Resp 16   Wt 76.7 kg (169 lb 3.2 oz)   SpO2 99%      Physical Exam  Vitals reviewed.   Constitutional:       Appearance: Normal appearance.   HENT:      Head: Normocephalic.   Eyes:      Conjunctiva/sclera: Conjunctivae normal.      Pupils: Pupils are equal, round, and reactive to light.   Cardiovascular:      Rate and Rhythm: Normal rate and regular rhythm.   Pulmonary:      Effort: Pulmonary effort is normal.  Breath sounds: Normal breath sounds.   Abdominal:      General: Abdomen is flat. There is no distension.      Palpations: Abdomen is soft.      Tenderness: There is no abdominal tenderness.   Musculoskeletal:         General: Normal range of motion.      Cervical back: Normal range of motion. No rigidity.   Lymphadenopathy:      Cervical: No cervical adenopathy.   Skin:     General: Skin is warm and dry.   Neurological:      General: No focal deficit present.      Mental Status: She is alert and oriented to person, place, and time.   Psychiatric:         Mood and Affect: Mood normal.         Behavior: Behavior normal.         Thought Content: Thought content normal.         Judgment: Judgment normal.         Performance Status:  Symptomatic; fully ambulatory    Pain  Scale: 2    Results:        Assessment/Plan    Cancer Staging <redacted file path>  No matching staging information was found for the patient.    Radiology Results (last 21 days)     ** No results found for the last 504 hours. **            Diagnoses and all orders for this visit:    Dedifferentiated liposarcoma (CMS/HCC)  -     CBC with Differential  -     Erythropoietin (EPO)  -     Folate  -     Lactate Dehydrogenase (LDH)  -     Methylmalonic Acid (MMA), LC-MS/MS  -     Reticulocyte Count  -     TSH  -     Vitamin B12  -     [facility] Appointment Request; Future  -     CBC with Differential; Future    Anemia due to other cause, not classified  -     CBC with Differential  -     Erythropoietin (EPO)  -     Folate  -     Lactate Dehydrogenase (LDH)  -     Methylmalonic Acid (MMA), LC-MS/MS  -     Reticulocyte Count  -     TSH  -     Vitamin B12  -     [facility] Appointment Request; Future  -     CBC with Differential; Future    Post-splenectomy  -     Cancel: HiB PRP-T conjugate vaccine 4 dose IM  -     Cancel: Meningococcal conjugate vaccine MCV4P IM  -     Cancel: Meningococcal B, OMV (Bexsero)  -     Cancel: Pneumococcal Conjugate Vaccine 13-Valent    Other orders  -     MAGNESIUM ORAL; Take 400 mg by mouth in the morning and 400 mg in the evening.  -     acetaminophen (TYLENOL) 650 mg ER tablet; Take 500 mg by mouth in the morning and 500 mg in the evening.  -     baclofen (LIORESAL) 10 mg tablet; TAKE 1 TABLET BY MOUTH ONCE EVERY NIGHT AS NEEDED  -     Emgality Syringe 120 mg/mL syrg; INJECT 1 ML UNDERT THE SKIN  EVERY 30 DAYS  -     lidocaine (LIDODERM) 5 % patch; PLACE ONE PATCH ONTO THE SKIN DAILY.  -     mirabegron (MYRBETRIQ) 50 mg Tb24; Take 50 mg by mouth.  -     prochlorperazine (COMPAZINE) 10 mg tablet; Take 10 mg by mouth as needed.  -     psyllium (METAMUCIL SMOOTH TEXTURE) 3.4 gram packet; Take by mouth.  -     cyanocobalamin (VITAMIN B12) 1,000 mcg tablet; Take 1,000 mcg by mouth Once Daily.  -      escitalopram (LEXAPRO) 10 mg tablet; Take 10 mg by mouth Once Daily.  -     EPINEPHrine (ADRENALIN) 0.1 mg/mL injection; Inject 0.3 mg into the muscle as needed for anaphylaxis.  -     Cancel: haemophilus b conjugate (ACTHIB) vaccine 0.5 mL  -     Cancel: Hib conj-tet,compnt 2 of 2(PF) vaccine 0.5 mL  -     Cancel: pneumococcal conjugate (PREVNAR 13) 0.5 mL vaccine 0.5 mL  -     Cancel: meningococcal A,C,Y,W135-diphtheria (MENVEO) 10-5 mcg/0.5 mL vaccine 0.5 mL  -     Cancel: meningococcal B vaccine,4-comp (BEXSERO) 50-50-50-25 mcg/0.5 mL vaccine 0.5 mL  -     Cancel: pneumococcal 23-val ps vaccine (PNEUMOVAX 23) syrg 0.5 mL  -     Cancel: meningococcal A,C,Y,W135-diphtheria (MENVEO) 10-5 mcg/0.5 mL vaccine 0.5 mL  -     Cancel: meningococcal B vaccine,4-comp (BEXSERO) 50-50-50-25 mcg/0.5 mL vaccine 0.5 mL  -     HiB PRP-T conjugate vaccine 4 dose IM  -     Pneumococcal Conjugate Vaccine 13-Valent  -     Meningococcal conjugate vaccine 4-valent IM  -     Meningococcal B, OMV (Bexsero)  -     DTaP HiB IPV combined vaccine IM    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  In our lab today, however, it is actually improved to 10.2.    She has not had other potential etiologies of anemia evaluated at this point and I think it would be reasonable to do that today.    In terms of her thromboses, I think she has had adequate therapy for those that would certainly be considered provoked.  I would not see a strong need for hypercoagulable evaluation at this time.    We will arrange for her to get appropriate postsplenectomy vaccines including ACTHIB, PRENAR 13, MENVEO, BEXSERO, and PENTASEL    Plans are for her to continue long-term follow-up of her liposarcoma up in [place].  Happy to assist any way I can.    Plan:     [No matching plan found]  [No matching plan found]    All questions and concerns discussed with patient and understanding verbalized    Total consult time was 63 minutes with greater than 50%  spent in counseling, coordination of care, and medical decision making.    Spero Curb, MD

## 2021-07-29 NOTE — Progress Notes (Signed)
Patient ID: [name] is a 71 y.o. female.  Referring Physician: No referring provider defined for this encounter.  Primary Care Provider: No primary care provider on file.    Subjective      Oncology History Overview Note   In January 2022 she had a traumatic episode.  Imaging demonstrated fracture of the body of the sternum, pulmonary contusion, and left kidney laceration.       Follow-up imaging in April 2022 showed a 4.5 x 3.6 x 5.3 cm heterogenous collection the previous site of the hematoma.  This is ultrasound evaluated in April 2022 and had noted to be enlarged to 7 cm.     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     A question was originally raised regarding neoadjuvant therapy.  It was elected, however, to move straight to surgery.     On March 04, 2021 she underwent resection of this mass including nephrectomy, colectomy, pancreatectomy, and splenectomy.     Subsequent to that surgery, she has had some ongoing issues with anemia.  Hemoglobins in December 2022 in January 2023 were 9.7 and 9.8 respectively.  MCV and MCH were normal.  White count and platelet counts were normal.     Renal function was normal.     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  She was placed on anticoagulation was subsequently stopped after 3 months.     Dedifferentiated liposarcoma (CMS/HCC)   07/05/2021 Initial Diagnosis    Dedifferentiated liposarcoma (CMS/HCC)          Hgb on our check was in reasonable check    Assessments for causes of anemia were all essentially normal    Hgb today is stable at 10.6    She has started post-splenectomy vaccinations    She does continue to have fatigue but admits she awakens frequently with urge incontinence      Review of Systems   Constitutional: Negative for appetite change, fatigue, fever and unexpected weight change.    HENT:   Negative for hearing loss, trouble swallowing and voice change.    Eyes: Negative for eye problems.   Respiratory: Negative for chest tightness, cough and shortness of breath.    Cardiovascular: Negative for chest pain and palpitations.   Gastrointestinal: Negative for abdominal distention and abdominal pain.   Genitourinary: Negative for dysuria, frequency and hematuria.    Musculoskeletal: Negative for gait problem.   Skin: Negative for itching and rash.   Neurological: Negative for dizziness, gait problem, headaches and light-headedness.   Hematological: Does not bruise/bleed easily.            Objective    BSA: 1.92 meters squared  BP 125/63 (BP Location: Left arm, Patient Position: Sitting)   Pulse 71   Temp 97.7 F (36.5 C) (Oral)   Resp 14   Ht 1.727 m (5\' 8" )   Wt 76.5 kg (168 lb 10.4 oz)   SpO2 98%   BMI 25.64 kg/m      Physical Exam  Vitals reviewed.   Constitutional:       Appearance: Normal appearance.   HENT:      Head: Normocephalic.   Eyes:      Conjunctiva/sclera: Conjunctivae normal.      Pupils: Pupils are equal, round, and reactive to light.   Cardiovascular:      Rate and Rhythm: Normal rate  and regular rhythm.   Pulmonary:      Effort: Pulmonary effort is normal.      Breath sounds: Normal breath sounds.   Abdominal:      General: Abdomen is flat. There is no distension.      Palpations: Abdomen is soft.      Tenderness: There is no abdominal tenderness.   Musculoskeletal:         General: Normal range of motion.      Cervical back: Normal range of motion. No rigidity.   Lymphadenopathy:      Cervical: No cervical adenopathy.   Skin:     General: Skin is warm and dry.   Neurological:      General: No focal deficit present.      Mental Status: She is alert and oriented to person, place, and time.   Psychiatric:         Mood and Affect: Mood normal.         Behavior: Behavior normal.         Thought Content: Thought content normal.         Judgment: Judgment normal.          Performance Status:  Asymptomatic  Pain Scale: 0    Lab Results (last 24 hours)     Procedure Component Value Ref Range Date/Time    CBC with Differential [[id number]]  (Abnormal) Collected: 07/29/21 1155    Lab Status: Final result Specimen: Blood, Venous Updated: 07/29/21 1158    Narrative:      The following orders were created for panel order CBC with Differential.  Procedure                               Abnormality         Status                     ---------                               -----------         ------                     CBC with Differential[[id number]]        Abnormal            Final result                 Please view results for these tests on the individual orders.    CBC with Differential [[id number]]  (Abnormal) Collected: 07/29/21 1155    Lab Status: Final result Specimen: Blood, Venous Updated: 07/29/21 1158     WBC 4.27 3.60 - 11.70 10*3/uL      RBC 3.74 3.72 - 5.24 10*6/uL      Hemoglobin 10.6 10.8 - 15.5 g/dL      Hematocrit 33 34 - 46 %      Mean Corpuscular Volume (MCV) 88 80 - 100 fL      Mean Corpuscular Hemoglobin (MCH) 28 26 - 34 pg      Mean Corpuscular Hemoglobin Conc (MCHC) 32 32 - 35 g/dL      Red Cell Distribution Width (RDW) 16.5 12.2 - 16.6 %      Platelet Count (PLT) 236 153 - 400 10*3/uL      Mean Platelet Volume (MPV) 9.2 7.3 -  11.2 fL      Neutrophils % 57 %      Lymphocytes % 28 %      Monocytes % 10 %      Eosinophils % 4 %      Basophils % 1 %      Neutrophils Absolute 2.40 1.60 - 8.20 10*3/uL      Lymphocytes # 1.20 1.07 - 3.45 10*3/uL      Monocytes # 0.40 0.20 - 0.85 10*3/uL      Eosinophils # 0.20 0.00 - 0.30 10*3/uL      Basophils # 0.00 0.00 - 0.10 10*3/uL              Radiology Results (last 21 days)     ** No results found for the last 504 hours. **              Assessment/Plan          Diagnoses and all orders for this visit:    Dedifferentiated liposarcoma (CMS/HCC)  -     [facility] Appointment Request  -     CBC with Differential    Anemia due to  other cause, not classified  [facility] Appointment Request  -     CBC with Differential    Other orders  -     ZOLMitriptan (ZOMIG) 5 mg tab tablet; Take 5 mg by mouth.  -     coenzyme Q-10 300 mg capsule; Take 300 mg by mouth.  -     scopolamine (TRANSDERM-SCOP) 1 mg over 3 days pt3d; PLACE ONE PATCH ONTO THE SKIN EVERY THIRD DAY. SCHEDULED FOR NAUSEA.  -     ranitidine 300 mg cap; Take 300 mg by mouth.  -     ondansetron (ZOFRAN) 4 mg tablet; TAKE ONE TABLET (4 MG DOSE) BY MOUTH EVERY 4 (FOUR) HOURS. SCHEDULED FOR NAUSEA.  -     metoclopramide (REGLAN) 10 mg tablet; TAKE ONE TABLET (10 MG DOSE) BY MOUTH 4 (FOUR) TIMES DAILY. TO HELP WITH NAUSEA.  -     magnesium oxide 400 mg tab tablet; Take 400 mg by mouth.  -     linaCLOtide (LINZESS) 145 mcg cap capsule; Take 145 mcg by mouth.  -     L. acidophilus/Bifid. animalis 32 billion cell cap; Take by mouth.  -     glucosamine HCl 1,500 mg tab; Take by mouth.  -     gabapentin (NEURONTIN) 100 mg capsule; TAKE ONE CAPSULE BY MOUTH 3 TIMES A DAY.  -     fluticasone propionate (FLONASE) 50 mcg/spray nasal spray; Administer 1 spray into affected nostril(s).  -     enoxaparin (LOVENOX) 40 mg/0.4 mL syrg; INJECT 0.4 MLS (40 MG DOSE) INTO THE SKIN EVERY 12 (TWELVE) HOURS.  -     diclofenac sodium (VOLTAREN) 1 % gel  -     cyclobenzaprine (FLEXERIL) 5 mg tablet; TAKE ONE TABLET BY MOUTH 3 TIMES A DAY AS NEEDED FOR UP TO 10 DAYS.  -     cholecalciferol (VITAMIN D3) 1,000 unit (25 mcg) tablet; Take 1,000 Units by mouth Once Daily.  Her main follow up for the sarcoma will be in [facility]    We will be happy to assist as needed    [No matching plan found]  [No matching plan found]    32 minutes spent reviewing current and previous data with more than 50% of time spent in counseling and  plan review.

## 2021-08-31 ENCOUNTER — Encounter (INDEPENDENT_AMBULATORY_CARE_PROVIDER_SITE_OTHER): Payer: Self-pay | Admitting: Internal Medicine

## 2021-09-05 ENCOUNTER — Encounter (INDEPENDENT_AMBULATORY_CARE_PROVIDER_SITE_OTHER): Payer: Self-pay | Admitting: Internal Medicine

## 2021-10-03 ENCOUNTER — Encounter (INDEPENDENT_AMBULATORY_CARE_PROVIDER_SITE_OTHER): Payer: Self-pay | Admitting: Internal Medicine

## 2021-10-03 ENCOUNTER — Other Ambulatory Visit: Payer: Self-pay

## 2021-10-03 ENCOUNTER — Ambulatory Visit (INDEPENDENT_AMBULATORY_CARE_PROVIDER_SITE_OTHER): Payer: Medicare Other | Admitting: Internal Medicine

## 2021-10-03 VITALS — BP 110/64 | HR 68 | Temp 96.9°F | Ht 66.0 in | Wt 169.2 lb

## 2021-10-03 DIAGNOSIS — G319 Degenerative disease of nervous system, unspecified: Secondary | ICD-10-CM | POA: Insufficient documentation

## 2021-10-03 DIAGNOSIS — E7212 Methylenetetrahydrofolate reductase deficiency: Secondary | ICD-10-CM

## 2021-10-03 DIAGNOSIS — Z9081 Acquired absence of spleen: Secondary | ICD-10-CM

## 2021-10-03 DIAGNOSIS — G43009 Migraine without aura, not intractable, without status migrainosus: Secondary | ICD-10-CM

## 2021-10-03 DIAGNOSIS — R413 Other amnesia: Secondary | ICD-10-CM

## 2021-10-03 DIAGNOSIS — Z23 Encounter for immunization: Secondary | ICD-10-CM

## 2021-10-03 DIAGNOSIS — M818 Other osteoporosis without current pathological fracture: Secondary | ICD-10-CM

## 2021-10-03 DIAGNOSIS — C785 Secondary malignant neoplasm of large intestine and rectum: Secondary | ICD-10-CM | POA: Insufficient documentation

## 2021-10-03 DIAGNOSIS — F411 Generalized anxiety disorder: Secondary | ICD-10-CM

## 2021-10-03 DIAGNOSIS — D696 Thrombocytopenia, unspecified: Secondary | ICD-10-CM | POA: Insufficient documentation

## 2021-10-03 MED ORDER — HYDROXYZINE HCL 25 MG TABLET
25.0000 mg | ORAL_TABLET | Freq: Every evening | ORAL | 3 refills | Status: DC | PRN
Start: 2021-10-03 — End: 2021-12-22

## 2021-10-03 MED ORDER — CALCIUM 500 MG (AS CALCIUM CARBONATE 1,250 MG) TABLET
500.0000 mg | ORAL_TABLET | Freq: Every day | ORAL | Status: AC
Start: 2021-10-03 — End: ?

## 2021-10-03 MED ORDER — CHOLECALCIFEROL (VITAMIN D3) 10 MCG (400 UNIT) CAPSULE
400.0000 [IU] | ORAL_CAPSULE | Freq: Every day | ORAL | 3 refills | Status: DC
Start: 2021-10-03 — End: 2022-04-26

## 2021-10-03 NOTE — Nursing Note (Signed)
BP 110/64 (Site: Manual, Patient Position: Sitting, Cuff Size: Adult)   Pulse 68   Temp 36.1 C (96.9 F) (Thermal Scan)   Ht 1.676 m ('5\' 6"'$ ) Comment: with shoes  Wt 76.7 kg (169 lb 3.2 oz)   SpO2 98%   BMI 27.31 kg/m     Riley Nearing, RN  10/03/2021, 13:29

## 2021-10-03 NOTE — Nursing Note (Signed)
10/03/21 1326   Depression Screen   Little interest or pleasure in doing things. 0   Feeling down, depressed, or hopeless 0   PHQ 2 Total 0

## 2021-10-03 NOTE — Patient Instructions (Signed)
Medicare Preventive Services  Medicare coverage information Recommendation for YOU   Heart Disease and Diabetes   Lipid profile Every 5 years or more often if at risk for cardiovascular disease     Diabetes Screening  yearly for those at risk for diabetes, 2 tests per year for those with prediabetes Last Glucose: 122    Diabetes Self Management Training or Medical Nutrition Therapy  For those with diabetes, up to 10 hrs initial training within a year, subsequent years up to 2 hrs of follow up training Optional for those with diabetes     Medical Nutrition Therapy Three hours of one-on-one counseling in first year, two hours in subsequent years Optional for those with diabetes, kidney disease   Intensive Behavioral Therapy for Obesity  Face-to-face counseling, first month every week, month 2-6 every other week, month 7-12 every month if continued progress is documented Optional for those with Body Mass Index 30 or higher  Your Body mass index is 27.31 kg/m.   Tobacco Cessation (Quitting) Counseling   Two attempts per year, max 4 sessions per attempt, up to 8 per year, for those with tobacco-related health condition Optional for those that use tobacco   Cancer Screening   Colorectal screening   For anyone age 4 to 32 or any age if high risk:  Screening Colonoscopy every 10 yrs if low risk,  more frequent if higher risk  OR  Cologuard Stool DNA test once every 3 years OR  Fecal Occult Blood Testing yearly OR  Flexible  Sigmoidoscopy  every 5 yr OR  CT Colonography every 5 yrs    See your schedule below   Screening Pap Test Recommended every 3 years for all women age 76 to 31, or every five years if combined with HPV test (routine screening not needed after total hysterectomy).  Medicare covers every 2 years, up to yearly if high risk.  Screening Pelvic Exam Medicare covers every 2 years, yearly if high risk or childbearing age with abnormal Pap in last 3 yrs. See your schedule below   Screening Mammogram    Recommended every 2 years for women age 51 to 73, or more frequent if you have a higher risk. Selectively recommended for women between 40-49 based on shared decisions about risk. Covered by Medicare up to every year for women age 76 or older See your schedule below   Lung Cancer Screening  Annual low dose computed tomography (LDCT scan) is recommended for those age 49-77 who smoked 20 pack-years and are current smokers or quit smoking within past 15 years (one pack-year= smoking one PPD for one year), after counseling by your doctor or nurse clinician about the possible benefits or harms. See your schedule below   Vaccinations   Pneumococcal Vaccine: Recommended routinely age 63+ with one or two separate vaccines based on your risk    Recommended before age 75 if medical conditions with increased risk  Seasonal Influenza Vaccine: Once every flu season   Hepatitis B Vaccine: 3 doses if risk (including anyone with diabetes or liver disease)  Shingles Vaccine: Two doses at age 86 or older  Diphtheria Tetanus Pertussis Vaccine: ONCE as adult, booster every 10 years     Immunization History   Administered Date(s) Administered    Covid-19 Vaccine,Pfizer Bivalent,7mg/0.3ml,12 yrs+ 02/25/2021    FLUZONE HD VACCINE (ADMIN) 04/05/2020    High-Dose Influenza Vaccine, 65+ 02/15/2016    Influenza Vaccine, 65+ 02/15/2016    Pneumovax 02/15/2016    ZOSTAVAX (VARICELLA  ZOSTER VACCINE) 06/01/2011     Shingles vaccine and Diphtheria Tetanus Pertussis vaccines are available at pharmacies or local health department without a prescription.   Other Screening   Bone Densitometry   Every 24 months for anyone at risk, including postmenopausal       Glaucoma Screening   Yearly if in high risk group such as diabetes, family history, African American age 25+ or Hispanic American age 71+      Hepatitis C Screening recommended ONCE for those born between 1945-1965, or high risk for HCV infection       HIV Testing recommended routinely at  least ONCE, covered every year for age 7 to 4 regardless of risk, and every year for age over 63 who ask for the test or higher risk  Yearly or up to 3 times in pregnancy         Abdominal Aortic Aneurysm Screening Ultrasound   Once between the age of 11-75 with a family history of AAA       Your Personalized Schedule for Preventive Tests     Health Maintenance: Pending and Last Completed         Date Due Completion Date    Hepatitis C screening Never done ---    Adult Tdap-Td (1 - Tdap) Never done ---    Shingles Vaccine (2 of 3) 07/27/2011 06/01/2011    Pneumococcal Vaccination, Age 54+ (2 - PCV) 02/14/2017 02/15/2016    Annual Wellness Visit - Calendar Year Insurers 06/05/2021 02/15/2016    Influenza Vaccine (Season Ended) 02/03/2022 04/05/2020    Depression Screening 10/04/2022 10/03/2021    Mammography 02/24/2023 02/23/2021    Colonoscopy 02/24/2025 02/25/2015    Osteoporosis screening 09/10/2031 09/09/2021

## 2021-10-03 NOTE — Nursing Note (Signed)
10/03/21 1326   Medicare Wellness Assessment   Medicare initial or wellness physical in the last year? No   Advance Directives   Does patient have a living will or MPOA no   Has patient provided [employer] with a copy? no   Advance directive information given to the patient today? no   Activities of Daily Living   Do you need help with dressing, bathing, or walking? Yes  (uses cane)   Do you need help with shopping, housekeeping, medications, or finances? Yes   Do you have rugs in hallways, broken steps, or poor lighting? No   Do you have grab bars in your bathroom, non-slip strips in your tub, and hand rails on your stairs? Yes   Urinary Incontinence Screen-Women only   Do you ever leak urine when you don't want to? YES   Cognitive Function Screen   What is you age? 1   What is the time to the nearest hour? 1   What is the year? 1   What is the name of this clinic? 1   Can the patient recognize two persons (the doctor, the nurse, home help, etc.)? 1   What is the date of your birth? (day and month sufficient)  1   In what year did World War II end? 1   Who is the current president of the [place]? 1   Count from 20 down to 1? 1   What address did I give you earlier? 0   Total Score 9   Interpretation of Total Score Greater than 6 Normal   Depression Screen   Little interest or pleasure in doing things. 0   Feeling down, depressed, or hopeless 0   PHQ 2 Total 0   Pain Score   Pain Score SIX  (knee pain)   Substance Use Screening   In Past 12 MONTHS, how often have you used any tobacco product (for example, cigarettes, e-cigarettes, cigars, pipes, or smokeless tobacco)? Never   In the PAST 12 MONTHS, how often have you had 5 (men)/4 (women) or more drinks containing alcohol in one day? Never   In the PAST 12 months, how often have you used any prescription medications just for the feeling, more than prescribed, or that were not prescribed for you? Prescriptions may include: opioids, benzodiazepines,  medications for ADHD Never   In the PAST 12 MONTHS, how often have you used any drugs, including marijuana, cocaine or crack, heroin, methamphetamine, hallucinogens, ecstasy/MDMA? Never   Hearing Screen   Have you noticed any hearing difficulties? No   After whispering 9-1-6 how many numbers did the patient repeat correctly? 2   After whispering 4-7-8 how many numbers did the patient repeat correctly? 3   Total Correct 5   Fall Risk Assessment   Do you feel unsteady when standing or walking? Yes  (uses cane)   Do you worry about falling? Yes   Have you fallen in the past year? No   Timed up and go test (in seconds) 14   Vision Screen   Right Eye = 20 20   Left Eye = 20 40
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
## 2021-10-06 NOTE — Addendum Note (Signed)
Addended by: Aaron Edelman on: 10/06/2021 09:37 AM     Modules accepted: Orders

## 2021-11-03 ENCOUNTER — Encounter (INDEPENDENT_AMBULATORY_CARE_PROVIDER_SITE_OTHER): Payer: Self-pay | Admitting: Internal Medicine

## 2021-11-03 ENCOUNTER — Ambulatory Visit (INDEPENDENT_AMBULATORY_CARE_PROVIDER_SITE_OTHER): Payer: Medicare Other | Admitting: Internal Medicine

## 2021-11-03 ENCOUNTER — Other Ambulatory Visit: Payer: Self-pay

## 2021-11-03 ENCOUNTER — Other Ambulatory Visit: Payer: Medicare Other | Attending: Internal Medicine

## 2021-11-03 VITALS — BP 120/68 | HR 78 | Temp 97.7°F | Ht 66.0 in | Wt 169.2 lb

## 2021-11-03 DIAGNOSIS — G8929 Other chronic pain: Secondary | ICD-10-CM

## 2021-11-03 DIAGNOSIS — R911 Solitary pulmonary nodule: Secondary | ICD-10-CM

## 2021-11-03 DIAGNOSIS — M818 Other osteoporosis without current pathological fracture: Secondary | ICD-10-CM | POA: Insufficient documentation

## 2021-11-03 DIAGNOSIS — F419 Anxiety disorder, unspecified: Secondary | ICD-10-CM

## 2021-11-03 DIAGNOSIS — W19XXXA Unspecified fall, initial encounter: Secondary | ICD-10-CM

## 2021-11-03 DIAGNOSIS — E611 Iron deficiency: Secondary | ICD-10-CM

## 2021-11-03 DIAGNOSIS — M25561 Pain in right knee: Secondary | ICD-10-CM

## 2021-11-03 LAB — CBC W/AUTO DIFF
BASOPHIL #: <0.1 10*3/uL (ref <=0.20)
BASOPHIL %: 1 %
EOSINOPHIL #: 0.12 10*3/uL (ref <=0.50)
EOSINOPHIL %: 2 %
HCT: 34.1 % — ABNORMAL LOW (ref 34.8–46.0)
HGB: 10.5 g/dL — ABNORMAL LOW (ref 11.5–16.0)
IMMATURE GRANULOCYTE #: <0.1 10*3/uL (ref <0.10)
IMMATURE GRANULOCYTE %: 0 % (ref 0–1)
LYMPHOCYTE #: 1.45 10*3/uL (ref 1.00–4.80)
LYMPHOCYTE %: 25 %
MCH: 30.4 pg (ref 26.0–32.0)
MCHC: 30.8 g/dL — ABNORMAL LOW (ref 31.0–35.5)
MCV: 98.8 fL (ref 78.0–100.0)
MONOCYTE #: 0.59 10*3/uL (ref 0.20–1.10)
MONOCYTE %: 10 %
MPV: 12.2 fL (ref 8.7–12.5)
NEUTROPHIL #: 3.6 10*3/uL (ref 1.50–7.70)
NEUTROPHIL %: 62 %
PLATELETS: 271 10*3/uL (ref 150–400)
RBC: 3.45 10*6/uL — ABNORMAL LOW (ref 3.85–5.22)
RDW-CV: 14.8 % (ref 11.5–15.5)
WBC: 5.8 10*3/uL (ref 3.7–11.0)

## 2021-11-03 LAB — COMPREHENSIVE METABOLIC PANEL, NON-FASTING
ALBUMIN: 3.9 g/dL (ref 3.4–4.8)
ALKALINE PHOSPHATASE: 57 U/L (ref 55–145)
ALT (SGPT): 24 U/L — ABNORMAL HIGH (ref 8–22)
ANION GAP: 10 mmol/L (ref 4–13)
AST (SGOT): 28 U/L (ref 8–45)
BILIRUBIN TOTAL: 0.3 mg/dL (ref 0.3–1.3)
BUN/CREA RATIO: 41 — ABNORMAL HIGH (ref 6–22)
BUN: 40 mg/dL — ABNORMAL HIGH (ref 8–25)
CALCIUM: 9.5 mg/dL (ref 8.8–10.2)
CHLORIDE: 104 mmol/L (ref 96–111)
CO2 TOTAL: 25 mmol/L (ref 23–31)
CREATININE: 0.98 mg/dL (ref 0.60–1.05)
ESTIMATED GFR: 61 mL/min/BSA (ref >=60)
GLUCOSE: 107 mg/dL (ref 65–125)
POTASSIUM: 4.8 mmol/L (ref 3.5–5.1)
PROTEIN TOTAL: 7.2 g/dL (ref 6.0–8.0)
SODIUM: 139 mmol/L (ref 136–145)

## 2021-11-03 LAB — VITAMIN D 25 TOTAL: VITAMIN D 25, TOTAL: 54 ng/mL (ref 30.0–100.0)

## 2021-11-03 MED ORDER — ESCITALOPRAM 10 MG TABLET
5.0000 mg | ORAL_TABLET | Freq: Every day | ORAL | 3 refills | Status: DC
Start: 2021-11-03 — End: 2022-04-26

## 2021-11-03 NOTE — Nursing Note (Signed)
BP 120/68   Pulse 78   Temp 36.5 C (97.7 F) (Thermal Scan)   Ht 1.676 m ('5\' 6"'$ )   Wt 76.7 kg (169 lb 3.2 oz)   SpO2 97%   BMI 27.31 kg/m     Riki Sheer, LPN
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
## 2021-11-04 ENCOUNTER — Inpatient Hospital Stay
Admission: RE | Admit: 2021-11-04 | Discharge: 2021-11-04 | Disposition: A | Discharge status: Home / Self Care | Payer: Medicare Other | Source: Ambulatory Visit | Attending: Internal Medicine | Admitting: Internal Medicine

## 2021-11-04 DIAGNOSIS — W19XXXA Unspecified fall, initial encounter: Secondary | ICD-10-CM

## 2021-11-04 DIAGNOSIS — S299XXA Unspecified injury of thorax, initial encounter: Secondary | ICD-10-CM | POA: Insufficient documentation

## 2021-11-16 ENCOUNTER — Ambulatory Visit (INDEPENDENT_AMBULATORY_CARE_PROVIDER_SITE_OTHER): Payer: Medicare PPO | Admitting: Orthopaedic Surgery

## 2021-11-16 ENCOUNTER — Encounter (INDEPENDENT_AMBULATORY_CARE_PROVIDER_SITE_OTHER): Payer: Self-pay | Admitting: Orthopaedic Surgery

## 2021-11-16 ENCOUNTER — Ambulatory Visit (INDEPENDENT_AMBULATORY_CARE_PROVIDER_SITE_OTHER): Payer: Medicare PPO

## 2021-11-16 VITALS — Ht 67.0 in | Wt 169.0 lb

## 2021-11-16 DIAGNOSIS — M1711 Unilateral primary osteoarthritis, right knee: Secondary | ICD-10-CM

## 2021-11-16 DIAGNOSIS — M5431 Sciatica, right side: Secondary | ICD-10-CM

## 2021-11-16 DIAGNOSIS — M25561 Pain in right knee: Secondary | ICD-10-CM

## 2021-11-16 MED ORDER — METHYLPREDNISOLONE ACETATE 80 MG/ML IJ SUSP
80.0000 mg | Freq: Once | INTRAMUSCULAR | Status: AC
Start: 2021-11-16 — End: 2021-11-16
  Administered 2021-11-16: 80 mg via INTRA_ARTICULAR

## 2021-11-16 NOTE — Progress Notes (Signed)
Nicole Sutton                           15-Aug-1950               [id number]     11/16/2021 following up for arthritis to her right knee as well as right leg pain that does admit to pain all the way down to her ankle since the last time we saw her she has had a lot going on she was diagnosed with sarcoma has had surgeries for that also did have a bowel obstruction    Physical Examination:      Visit Vitals  Ht 1.702 m (5\' 7" )   Wt 76.7 kg (169 lb)   BMI 26.47 kg/m      Physical exam does have a genu valgum deformity full extension 115 degrees of flexion +1 effusion does have paresthesias along the L3-4 dermatome myotomes are intact    X-ray Findings:XR Knee Right 3 Views  Three-view x-ray of her right knee does show severe arthritis specifically   involving the lateral compartment of her right knee          Diagnosis:  1. Right knee pain, unspecified chronicity  XR Knee Right 3 Views      2. Arthritis of right knee        3. Sciatica of right side  MRI lumbar spine without contrast             Plan: I did offer her 1 intra-articular injection also going to get her set up for an MRI of her lumbar spine I think some of her pain and even weakness in that leg is from her back after verbal consent was obtained the lateral aspect of her right knee was sterilely prepped did an injection of 2 cc lidocaine 1 cc of Depo-Medrol 80 mg/cc she tolerated well we will see her back after the MRI and give further recommendations      Electronically signed and finalized by:    Raliegh Scarlet., DO  11/16/2021 3:07 PM     Although significant efforts were made to ensure accuracy of spelling and grammar, today's note was completed in large part by speech/voice recognition Chemical engineer) software and by keyboarding information into the electronic health care record in real time.  As such, there may be errors in grammar and spelling, insertion of incorrect words/phrases, pronoun errors, etc., that should be disregarded when  reviewing this note.

## 2021-11-17 ENCOUNTER — Other Ambulatory Visit (HOSPITAL_COMMUNITY): Payer: Self-pay | Admitting: Orthopaedic Surgery

## 2021-11-17 DIAGNOSIS — M5431 Sciatica, right side: Secondary | ICD-10-CM

## 2021-11-22 ENCOUNTER — Other Ambulatory Visit (HOSPITAL_COMMUNITY): Payer: Medicare Other

## 2021-11-28 ENCOUNTER — Other Ambulatory Visit (INDEPENDENT_AMBULATORY_CARE_PROVIDER_SITE_OTHER): Payer: Self-pay | Admitting: Family Medicine

## 2021-11-28 DIAGNOSIS — Z9109 Other allergy status, other than to drugs and biological substances: Secondary | ICD-10-CM

## 2021-11-29 ENCOUNTER — Other Ambulatory Visit (INDEPENDENT_AMBULATORY_CARE_PROVIDER_SITE_OTHER): Payer: Self-pay | Admitting: Internal Medicine

## 2021-11-29 ENCOUNTER — Other Ambulatory Visit: Payer: Self-pay

## 2021-11-29 ENCOUNTER — Telehealth (INDEPENDENT_AMBULATORY_CARE_PROVIDER_SITE_OTHER): Payer: Self-pay | Admitting: Internal Medicine

## 2021-11-29 ENCOUNTER — Inpatient Hospital Stay
Admission: RE | Admit: 2021-11-29 | Discharge: 2021-11-29 | Disposition: A | Discharge status: Home / Self Care | Payer: Medicare Other | Source: Ambulatory Visit | Attending: Body Imaging | Admitting: Body Imaging

## 2021-11-29 DIAGNOSIS — R911 Solitary pulmonary nodule: Secondary | ICD-10-CM

## 2021-11-29 DIAGNOSIS — R918 Other nonspecific abnormal finding of lung field: Secondary | ICD-10-CM

## 2021-11-29 MED ORDER — ALENDRONATE 70 MG TABLET
70.0000 mg | ORAL_TABLET | ORAL | 3 refills | Status: DC
Start: 2021-11-29 — End: 2022-07-20

## 2021-11-29 MED ORDER — IOPAMIDOL 300 MG IODINE/ML (61 %) INTRAVENOUS SOLUTION
100.0000 mL | INTRAVENOUS | Status: DC
Start: 2021-11-29 — End: 2021-11-29

## 2021-11-29 NOTE — Telephone Encounter (Signed)
Called pt made aware of test per provider's note. Pt verbalized understanding and has no concerns or questions .  Riki Sheer, LPN

## 2021-11-29 NOTE — Telephone Encounter (Signed)
New spiculated subsolid nodule within the right upper lobe. Recommend follow-up chest CT in approximately 3-6 months for assessment of stability.

## 2021-11-30 ENCOUNTER — Encounter (HOSPITAL_COMMUNITY): Payer: Self-pay

## 2021-12-01 ENCOUNTER — Other Ambulatory Visit (HOSPITAL_BASED_OUTPATIENT_CLINIC_OR_DEPARTMENT_OTHER): Payer: Medicare Other

## 2021-12-02 ENCOUNTER — Encounter (HOSPITAL_COMMUNITY): Payer: Self-pay

## 2021-12-05 ENCOUNTER — Inpatient Hospital Stay
Admission: RE | Admit: 2021-12-05 | Discharge: 2021-12-05 | Disposition: A | Discharge status: Home / Self Care | Payer: Medicare Other | Source: Ambulatory Visit | Attending: Orthopaedic Surgery | Admitting: Orthopaedic Surgery

## 2021-12-05 ENCOUNTER — Other Ambulatory Visit: Payer: Self-pay

## 2021-12-05 DIAGNOSIS — M5431 Sciatica, right side: Secondary | ICD-10-CM | POA: Insufficient documentation

## 2021-12-07 DIAGNOSIS — M48061 Spinal stenosis, lumbar region without neurogenic claudication: Secondary | ICD-10-CM

## 2021-12-07 DIAGNOSIS — M4316 Spondylolisthesis, lumbar region: Secondary | ICD-10-CM

## 2021-12-07 DIAGNOSIS — M5136 Other intervertebral disc degeneration, lumbar region: Secondary | ICD-10-CM

## 2021-12-07 DIAGNOSIS — M9943 Connective tissue stenosis of neural canal of lumbar region: Secondary | ICD-10-CM

## 2021-12-08 ENCOUNTER — Other Ambulatory Visit (INDEPENDENT_AMBULATORY_CARE_PROVIDER_SITE_OTHER): Payer: Self-pay | Admitting: Orthopaedic Surgery

## 2021-12-08 ENCOUNTER — Ambulatory Visit (INDEPENDENT_AMBULATORY_CARE_PROVIDER_SITE_OTHER): Payer: Medicare PPO | Admitting: Orthopaedic Surgery

## 2021-12-08 DIAGNOSIS — M5431 Sciatica, right side: Secondary | ICD-10-CM

## 2021-12-15 ENCOUNTER — Ambulatory Visit (INDEPENDENT_AMBULATORY_CARE_PROVIDER_SITE_OTHER): Payer: Medicare PPO | Admitting: Orthopaedic Surgery

## 2021-12-22 ENCOUNTER — Encounter (HOSPITAL_COMMUNITY): Payer: Self-pay

## 2021-12-22 ENCOUNTER — Emergency Department (HOSPITAL_COMMUNITY): Payer: Medicare Other

## 2021-12-22 ENCOUNTER — Other Ambulatory Visit: Payer: Self-pay

## 2021-12-22 ENCOUNTER — Emergency Department
Admission: EM | Admit: 2021-12-22 | Discharge: 2021-12-23 | Disposition: A | Discharge status: Other Acute Inpatient Hospital | Payer: Medicare Other | Attending: Emergency Medicine | Admitting: Emergency Medicine

## 2021-12-22 ENCOUNTER — Encounter (INDEPENDENT_AMBULATORY_CARE_PROVIDER_SITE_OTHER): Payer: Self-pay | Admitting: Orthopaedic Surgery

## 2021-12-22 ENCOUNTER — Ambulatory Visit (INDEPENDENT_AMBULATORY_CARE_PROVIDER_SITE_OTHER): Payer: Medicare PPO | Admitting: Orthopaedic Surgery

## 2021-12-22 VITALS — Ht 67.0 in | Wt 169.0 lb

## 2021-12-22 DIAGNOSIS — M5431 Sciatica, right side: Secondary | ICD-10-CM

## 2021-12-22 DIAGNOSIS — R933 Abnormal findings on diagnostic imaging of other parts of digestive tract: Secondary | ICD-10-CM | POA: Insufficient documentation

## 2021-12-22 DIAGNOSIS — R188 Other ascites: Secondary | ICD-10-CM

## 2021-12-22 DIAGNOSIS — K429 Umbilical hernia without obstruction or gangrene: Secondary | ICD-10-CM | POA: Insufficient documentation

## 2021-12-22 DIAGNOSIS — Z85831 Personal history of malignant neoplasm of soft tissue: Secondary | ICD-10-CM | POA: Insufficient documentation

## 2021-12-22 DIAGNOSIS — K388 Other specified diseases of appendix: Secondary | ICD-10-CM

## 2021-12-22 DIAGNOSIS — K5900 Constipation, unspecified: Secondary | ICD-10-CM

## 2021-12-22 DIAGNOSIS — M4316 Spondylolisthesis, lumbar region: Secondary | ICD-10-CM | POA: Insufficient documentation

## 2021-12-22 DIAGNOSIS — R935 Abnormal findings on diagnostic imaging of other abdominal regions, including retroperitoneum: Secondary | ICD-10-CM

## 2021-12-22 DIAGNOSIS — M5135 Other intervertebral disc degeneration, thoracolumbar region: Secondary | ICD-10-CM | POA: Insufficient documentation

## 2021-12-22 DIAGNOSIS — K56609 Unspecified intestinal obstruction, unspecified as to partial versus complete obstruction: Secondary | ICD-10-CM

## 2021-12-22 DIAGNOSIS — N6312 Unspecified lump in the right breast, upper inner quadrant: Secondary | ICD-10-CM | POA: Insufficient documentation

## 2021-12-22 DIAGNOSIS — R911 Solitary pulmonary nodule: Secondary | ICD-10-CM | POA: Insufficient documentation

## 2021-12-22 LAB — COMPREHENSIVE METABOLIC PANEL, NON-FASTING
ALBUMIN: 4.1 g/dL (ref 3.4–4.8)
ALKALINE PHOSPHATASE: 60 U/L (ref 55–145)
ALT (SGPT): 16 U/L (ref 8–22)
ANION GAP: 9 mmol/L (ref 4–13)
AST (SGOT): 22 U/L (ref 8–45)
BILIRUBIN TOTAL: 0.3 mg/dL (ref 0.3–1.3)
BUN/CREA RATIO: 32 — ABNORMAL HIGH (ref 6–22)
BUN: 32 mg/dL — ABNORMAL HIGH (ref 8–25)
CALCIUM: 10.5 mg/dL — ABNORMAL HIGH (ref 8.8–10.2)
CHLORIDE: 103 mmol/L (ref 96–111)
CO2 TOTAL: 28 mmol/L (ref 23–31)
CREATININE: 1 mg/dL (ref 0.60–1.05)
ESTIMATED GFR: 60 mL/min/BSA (ref >=60)
GLUCOSE: 126 mg/dL — ABNORMAL HIGH (ref 65–125)
POTASSIUM: 3.8 mmol/L (ref 3.5–5.1)
PROTEIN TOTAL: 7.6 g/dL (ref 6.0–8.0)
SODIUM: 140 mmol/L (ref 136–145)

## 2021-12-22 LAB — CBC WITH DIFF
BASOPHIL #: <0.1 10*3/uL (ref <=0.20)
BASOPHIL %: 0 %
EOSINOPHIL #: 0.22 10*3/uL (ref <=0.50)
EOSINOPHIL %: 3 %
HCT: 34.8 % (ref 34.8–46.0)
HGB: 11.5 g/dL (ref 11.5–16.0)
IMMATURE GRANULOCYTE #: <0.1 10*3/uL (ref <0.10)
IMMATURE GRANULOCYTE %: 0 % (ref 0–1)
LYMPHOCYTE #: 1.81 10*3/uL (ref 1.00–4.80)
LYMPHOCYTE %: 26 %
MCH: 31.2 pg (ref 26.0–32.0)
MCHC: 33 g/dL (ref 31.0–35.5)
MCV: 94.3 fL (ref 78.0–100.0)
MONOCYTE #: 0.68 10*3/uL (ref 0.20–1.10)
MONOCYTE %: 10 %
MPV: 11.3 fL (ref 8.7–12.5)
NEUTROPHIL #: 4.29 10*3/uL (ref 1.50–7.70)
NEUTROPHIL %: 61 %
PLATELETS: 261 10*3/uL (ref 150–400)
RBC: 3.69 10*6/uL — ABNORMAL LOW (ref 3.85–5.22)
RDW-CV: 14.4 % (ref 11.5–15.5)
WBC: 7 10*3/uL (ref 3.7–11.0)

## 2021-12-22 LAB — LIPASE: LIPASE: 14 U/L (ref 10–60)

## 2021-12-22 LAB — BLUE TOP TUBE

## 2021-12-22 LAB — LACTIC ACID LEVEL: LACTIC ACID: 1.8 mmol/L (ref 0.5–2.2)

## 2021-12-22 MED ORDER — DIATRIZOATE MEGLUMINE-DIATRIZOATE SODIUM 66 %-10 % ORAL SOLUTION
30.0000 mL | ORAL | Status: AC
Start: 2021-12-22 — End: 2021-12-22
  Administered 2021-12-22: 30 mL via ORAL
  Filled 2021-12-22: qty 30

## 2021-12-22 MED ORDER — SODIUM CHLORIDE 0.9 % IV BOLUS
1000.0000 mL | INJECTION | Status: AC
Start: 2021-12-22 — End: 2021-12-22
  Administered 2021-12-22: 0 mL via INTRAVENOUS
  Administered 2021-12-22: 1000 mL via INTRAVENOUS

## 2021-12-22 MED ORDER — ONDANSETRON HCL (PF) 4 MG/2 ML INJECTION SOLUTION
4.0000 mg | INTRAMUSCULAR | Status: AC
Start: 2021-12-22 — End: 2021-12-22
  Administered 2021-12-22: 4 mg via INTRAVENOUS
  Filled 2021-12-22: qty 2

## 2021-12-22 MED ORDER — MORPHINE 4 MG/ML INJECTION SYRINGE
4.0000 mg | INJECTION | INTRAMUSCULAR | Status: AC
Start: 2021-12-22 — End: 2021-12-22
  Administered 2021-12-22: 4 mg via INTRAVENOUS
  Filled 2021-12-22: qty 1

## 2021-12-22 MED ORDER — SODIUM CHLORIDE 0.9 % INTRAVENOUS SOLUTION
INTRAVENOUS | Status: DC
Start: 2021-12-22 — End: 2021-12-22

## 2021-12-22 MED ORDER — IOPAMIDOL 300 MG IODINE/ML (61 %) INTRAVENOUS SOLUTION
100.0000 mL | INTRAVENOUS | Status: AC
Start: 2021-12-22 — End: 2021-12-22
  Administered 2021-12-22: 100 mL via INTRAVENOUS
  Filled 2021-12-22: qty 100

## 2021-12-22 MED ORDER — METOCLOPRAMIDE 5 MG/ML INJECTION SOLUTION
10.0000 mg | INTRAMUSCULAR | Status: AC
Start: 2021-12-23 — End: 2021-12-23
  Administered 2021-12-23: 10 mg via INTRAVENOUS
  Filled 2021-12-22: qty 2

## 2021-12-22 MED ORDER — KETOROLAC 15 MG/ML INJECTION SOLUTION
15.0000 mg | INTRAMUSCULAR | Status: AC
Start: 2021-12-22 — End: 2021-12-22
  Administered 2021-12-22: 15 mg via INTRAVENOUS
  Filled 2021-12-22: qty 1

## 2021-12-22 MED ORDER — PIPERACILLIN-TAZOBACTAM 4.5 GRAM INTRAVENOUS SOLUTION
4.5000 g | INTRAVENOUS | Status: AC
Start: 2021-12-22 — End: 2021-12-22
  Administered 2021-12-22: 0 g via INTRAVENOUS
  Administered 2021-12-22: 4.5 g via INTRAVENOUS
  Filled 2021-12-22: qty 20

## 2021-12-22 MED ORDER — HYDROMORPHONE 2 MG/ML INJECTION SYRINGE
2.0000 mg | INJECTION | INTRAMUSCULAR | Status: AC
Start: 2021-12-23 — End: 2021-12-22
  Administered 2021-12-22: 2 mg via INTRAVENOUS
  Filled 2021-12-22: qty 1

## 2021-12-22 NOTE — ED Triage Notes (Signed)
Generalized abdominal pain all over. Much like before w/ her bowel obstruction. Reports nausea and belching but denies vomiting. She is alert and oriented x 3, skin warm and dry, ambulates with a cane.

## 2021-12-22 NOTE — ED Nurses Note (Signed)
Pt given oral contrast at 2100.
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
## 2021-12-22 NOTE — ED Nurses Note (Signed)
Pt back from radiology. Placed back on cont pulse ox, BP and cardiac monitoring. Pt moaning in pain, rates at 10/10 despite Morphine admin. VS taken and charted. Pt bed in low locked position with SR up x2. Family at bedside. Call light in reach.

## 2021-12-22 NOTE — ED Nurses Note (Signed)
Paged gen surg @ [facility] for consult

## 2021-12-22 NOTE — ED Nurses Note (Signed)
Dr. Burnis Kingfisher consulting with Dr. Rudy Jew

## 2021-12-22 NOTE — ED Nurses Note (Signed)
Pt continues to grimace and grunt in pain. Pt states pain has improved to 8/10 and is no longer a stabbing pain but it still very strong. Provider informed of same.

## 2021-12-22 NOTE — ED Nurses Note (Signed)
Placed in gown and on the monitor. Unable to provide a urine sample at this time. Warm blankets given.

## 2021-12-22 NOTE — ED Nurses Note (Signed)
Pt given oral contrast to drink for CT.

## 2021-12-22 NOTE — Progress Notes (Signed)
Pt is requesting a print out of her mri results.

## 2021-12-22 NOTE — Progress Notes (Signed)
Nicole Sutton                           Oct 29, 1950               [id number]     12/22/2021 following up for an MRI to her lumbar spine still is dealing with quite a bit of right leg pain as well as weakness    Physical Examination:      Visit Vitals  Ht 1.702 m (5\' 7" )   Wt 76.7 kg (169 lb)   BMI 26.47 kg/m      Unchanged    X-ray Findings: Did review her MRI with her from [facility] which did show a spondylolisthesis at L4-5 with pretty significant amount of stenosis at that level      Diagnosis:  1. Sciatica of right side  Ambulatory referral to Physical Therapy    Ambulatory referral to Pain Clinic             Plan: Going to refer her to pain management to consider epidural injections also going to give her a prescription for physical therapy we will see her back in 6 weeks for recheck      Electronically signed and finalized by:    [facility] DO  12/22/2021 1:17 PM     Although significant efforts were made to ensure accuracy of spelling and grammar, today's note was completed in large part by speech/voice recognition 12/24/2021) software and by keyboarding information into the electronic health care record in real time.  As such, there may be errors in grammar and spelling, insertion of incorrect words/phrases, pronoun errors, etc., that should be disregarded when reviewing this note.

## 2021-12-23 ENCOUNTER — Inpatient Hospital Stay
Admission: EM | Admit: 2021-12-23 | Discharge: 2021-12-30 | DRG: 336 | Disposition: A | Discharge status: Home / Self Care | Payer: Medicare PPO | Source: Other Acute Inpatient Hospital | Attending: Surgery | Admitting: Surgery

## 2021-12-23 ENCOUNTER — Emergency Department: Payer: Medicare PPO

## 2021-12-23 ENCOUNTER — Inpatient Hospital Stay: Payer: Medicare PPO

## 2021-12-23 DIAGNOSIS — K219 Gastro-esophageal reflux disease without esophagitis: Secondary | ICD-10-CM | POA: Diagnosis present

## 2021-12-23 DIAGNOSIS — K56609 Unspecified intestinal obstruction, unspecified as to partial versus complete obstruction: Secondary | ICD-10-CM | POA: Diagnosis present

## 2021-12-23 DIAGNOSIS — R188 Other ascites: Secondary | ICD-10-CM | POA: Diagnosis present

## 2021-12-23 DIAGNOSIS — K565 Intestinal adhesions [bands], unspecified as to partial versus complete obstruction: Principal | ICD-10-CM | POA: Diagnosis present

## 2021-12-23 DIAGNOSIS — Z85831 Personal history of malignant neoplasm of soft tissue: Secondary | ICD-10-CM

## 2021-12-23 DIAGNOSIS — Z79899 Other long term (current) drug therapy: Secondary | ICD-10-CM

## 2021-12-23 DIAGNOSIS — K3580 Unspecified acute appendicitis: Secondary | ICD-10-CM | POA: Diagnosis present

## 2021-12-23 DIAGNOSIS — R52 Pain, unspecified: Secondary | ICD-10-CM

## 2021-12-23 DIAGNOSIS — R112 Nausea with vomiting, unspecified: Secondary | ICD-10-CM

## 2021-12-23 DIAGNOSIS — R1084 Generalized abdominal pain: Secondary | ICD-10-CM

## 2021-12-23 DIAGNOSIS — C499 Malignant neoplasm of connective and soft tissue, unspecified: Secondary | ICD-10-CM | POA: Diagnosis present

## 2021-12-23 DIAGNOSIS — K5900 Constipation, unspecified: Secondary | ICD-10-CM | POA: Diagnosis present

## 2021-12-23 DIAGNOSIS — Z86010 Personal history of colonic polyps: Secondary | ICD-10-CM

## 2021-12-23 LAB — RED TOP TUBE

## 2021-12-23 LAB — LIGHT GREEN TOP TUBE

## 2021-12-23 LAB — GREEN TUBE

## 2021-12-23 LAB — CBC AND DIFFERENTIAL
Basophils %: 0.2 % (ref 0.0–3.0)
Basophils Absolute: 0 10*3/uL (ref 0.0–0.3)
Eosinophils %: 0.7 % (ref 0.0–7.0)
Eosinophils Absolute: 0.1 10*3/uL (ref 0.0–0.8)
Hematocrit: 39.3 % (ref 36.0–48.0)
Hemoglobin: 11.8 gm/dL — ABNORMAL LOW (ref 12.0–16.0)
Lymphocytes Absolute: 1.3 10*3/uL (ref 0.6–5.1)
Lymphocytes: 9.7 % — ABNORMAL LOW (ref 15.0–46.0)
MCH: 31 pg (ref 28–35)
MCHC: 30 gm/dL — ABNORMAL LOW (ref 32–36)
MCV: 102 fL — ABNORMAL HIGH (ref 80–100)
MPV: 9.7 fL (ref 6.0–10.0)
Monocytes Absolute: 0.9 10*3/uL (ref 0.1–1.7)
Monocytes: 7.3 % (ref 3.0–15.0)
Neutrophils %: 82.2 % — ABNORMAL HIGH (ref 42.0–78.0)
Neutrophils Absolute: 10.7 10*3/uL — ABNORMAL HIGH (ref 1.7–8.6)
PLT CT: 240 10*3/uL (ref 130–440)
RBC: 3.84 10*6/uL (ref 3.80–5.00)
RDW: 12.5 % (ref 11.0–14.0)
WBC: 13 10*3/uL — ABNORMAL HIGH (ref 4.0–11.0)

## 2021-12-23 LAB — BASIC METABOLIC PANEL
Anion Gap: 15.2 mMol/L (ref 7.0–18.0)
BUN / Creatinine Ratio: 29.3 Ratio (ref 10.0–30.0)
BUN: 29 mg/dL — ABNORMAL HIGH (ref 7–22)
CO2: 23 mMol/L (ref 20–30)
Calcium: 9.3 mg/dL (ref 8.5–10.5)
Chloride: 107 mMol/L (ref 98–110)
Creatinine: 0.99 mg/dL (ref 0.60–1.20)
EGFR: 61 mL/min/{1.73_m2} (ref 60–150)
Glucose: 145 mg/dL — ABNORMAL HIGH (ref 71–99)
Osmolality Calculated: 290 mOsm/kg (ref 275–300)
Potassium: 4.2 mMol/L (ref 3.5–5.3)
Sodium: 141 mMol/L (ref 136–147)

## 2021-12-23 LAB — VH EXTRA SPECIMEN LABEL

## 2021-12-23 LAB — LACTIC ACID, PLASMA: Lactic Acid: 1.2 mMol/L (ref 0.5–1.9)

## 2021-12-23 MED ORDER — PIPERACILLIN-TAZOBACTAM IN DEX 3-0.375 GM/50ML IV SOLN
3.3750 g | Freq: Four times a day (QID) | INTRAVENOUS | Status: DC
Start: 2021-12-23 — End: 2021-12-29
  Administered 2021-12-23 – 2021-12-29 (×23): 3.375 g via INTRAVENOUS

## 2021-12-23 MED ORDER — ENOXAPARIN SODIUM 40 MG/0.4ML IJ SOSY
40.0000 mg | PREFILLED_SYRINGE | Freq: Once | INTRAMUSCULAR | Status: DC
Start: 2021-12-23 — End: 2021-12-23

## 2021-12-23 MED ORDER — ENOXAPARIN SODIUM 40 MG/0.4ML IJ SOSY
40.0000 mg | PREFILLED_SYRINGE | INTRAMUSCULAR | Status: DC
Start: 2021-12-23 — End: 2021-12-30
  Administered 2021-12-23 – 2021-12-30 (×8): 40 mg via SUBCUTANEOUS

## 2021-12-23 MED ORDER — LACTATED RINGERS IV SOLN
INTRAVENOUS | Status: DC
Start: 2021-12-23 — End: 2021-12-25

## 2021-12-23 MED ORDER — SODIUM CHLORIDE (PF) 0.9 % IJ SOLN
3.0000 mL | Freq: Two times a day (BID) | INTRAMUSCULAR | Status: DC
Start: 2021-12-23 — End: 2021-12-30
  Administered 2021-12-23 – 2021-12-30 (×14): 3 mL via INTRAVENOUS

## 2021-12-23 MED ORDER — PIPERACILLIN-TAZOBACTAM IN DEX 3-0.375 GM/50ML IV SOLN
3.3750 g | Freq: Once | INTRAVENOUS | Status: DC
Start: 2021-12-23 — End: 2021-12-23

## 2021-12-23 MED ORDER — SODIUM CHLORIDE (PF) 0.9 % IJ SOLN
20.0000 mg | Freq: Two times a day (BID) | INTRAVENOUS | Status: DC
Start: 2021-12-23 — End: 2021-12-29
  Administered 2021-12-23 – 2021-12-28 (×11): 20 mg via INTRAVENOUS

## 2021-12-23 MED ORDER — SODIUM CHLORIDE (PF) 0.9 % IJ SOLN
0.4000 mg | INTRAMUSCULAR | Status: DC | PRN
Start: 2021-12-23 — End: 2021-12-30

## 2021-12-23 MED ORDER — HYDROMORPHONE HCL 0.5 MG/0.5 ML IJ SOLN
0.5000 mg | Freq: Once | INTRAMUSCULAR | Status: AC
Start: 2021-12-23 — End: 2021-12-23
  Administered 2021-12-23: 0.5 mg via INTRAVENOUS

## 2021-12-23 MED ORDER — HYDROMORPHONE 1 MG/ML INJECTION WRAPPER
1.0000 mg | INJECTION | INTRAMUSCULAR | Status: AC
Start: 2021-12-23 — End: 2021-12-23
  Administered 2021-12-23: 1 mg via INTRAVENOUS
  Filled 2021-12-23: qty 1

## 2021-12-23 MED ORDER — MORPHINE SULFATE 2 MG/ML IJ/IV SOLN (WRAP)
2.0000 mg | Status: DC | PRN
Start: 2021-12-23 — End: 2021-12-23

## 2021-12-23 MED ORDER — HYDROMORPHONE HCL 0.5 MG/0.5 ML IJ SOLN
0.2500 mg | INTRAMUSCULAR | Status: DC | PRN
Start: 2021-12-23 — End: 2021-12-29
  Administered 2021-12-23 – 2021-12-27 (×10): 0.5 mg via INTRAVENOUS

## 2021-12-23 MED ORDER — ONDANSETRON HCL 4 MG/2ML IJ SOLN
4.0000 mg | INTRAMUSCULAR | Status: DC | PRN
Start: 2021-12-23 — End: 2021-12-30
  Administered 2021-12-23 – 2021-12-26 (×5): 4 mg via INTRAVENOUS

## 2021-12-23 MED ORDER — KETOROLAC TROMETHAMINE 15 MG/ML IJ SOLN
15.0000 mg | Freq: Four times a day (QID) | INTRAMUSCULAR | Status: DC
Start: 2021-12-23 — End: 2021-12-23

## 2021-12-23 MED ORDER — VH SODIUM CHLORIDE 0.9 % IV BOLUS
1000.0000 mL | Freq: Once | INTRAVENOUS | Status: AC
Start: 2021-12-23 — End: 2021-12-23
  Administered 2021-12-23: 1000 mL via INTRAVENOUS

## 2021-12-23 MED ORDER — KETOROLAC TROMETHAMINE 15 MG/ML IJ SOLN
15.0000 mg | Freq: Four times a day (QID) | INTRAMUSCULAR | Status: AC
Start: 2021-12-23 — End: 2021-12-26
  Administered 2021-12-23 – 2021-12-25 (×9): 15 mg via INTRAVENOUS

## 2021-12-23 MED ORDER — PROCHLORPERAZINE EDISYLATE 10 MG/2ML IJ SOLN
10.0000 mg | Freq: Four times a day (QID) | INTRAMUSCULAR | Status: DC | PRN
Start: 2021-12-23 — End: 2021-12-29
  Administered 2021-12-23 – 2021-12-25 (×4): 10 mg via INTRAVENOUS

## 2021-12-23 MED ORDER — ONDANSETRON HCL 4 MG/2ML IJ SOLN
4.0000 mg | Freq: Once | INTRAMUSCULAR | Status: AC
Start: 2021-12-23 — End: 2021-12-23
  Administered 2021-12-23: 4 mg via INTRAVENOUS

## 2021-12-23 MED ORDER — KETOROLAC TROMETHAMINE 15 MG/ML IJ SOLN
15.0000 mg | Freq: Four times a day (QID) | INTRAMUSCULAR | Status: DC | PRN
Start: 2021-12-23 — End: 2021-12-23
  Administered 2021-12-23: 15 mg via INTRAVENOUS

## 2021-12-23 MED ORDER — ACETAMINOPHEN 10 MG/ML IV SOLN
1000.0000 mg | Freq: Four times a day (QID) | INTRAVENOUS | Status: AC
Start: 2021-12-23 — End: 2021-12-25
  Administered 2021-12-23 – 2021-12-25 (×8): 1000 mg via INTRAVENOUS

## 2021-12-23 MED ORDER — ONDANSETRON 4 MG PO TBDP
4.0000 mg | ORAL_TABLET | Freq: Three times a day (TID) | ORAL | Status: DC | PRN
Start: 2021-12-23 — End: 2021-12-30

## 2021-12-23 NOTE — ED Nurses Note (Signed)
Dr. Rae Mar, gen surg @ [facility], accepted pt. ER-ER. Report # [phone]

## 2021-12-23 NOTE — ED Nurses Note (Signed)
Pt states she has urge incontinence. Pad soiled with urine. Pt cleaned and sanitary pad applied.

## 2021-12-23 NOTE — ED Nurses Note (Signed)
HT ETA 30-45 mins - [phone]

## 2021-12-23 NOTE — ED Nurses Note (Signed)
Called [facility], spoke to [place].

## 2021-12-23 NOTE — ED Nurses Note (Addendum)
Dr. Burnis Kingfisher consulting with Dr. Rae Mar @ [facility]

## 2021-12-23 NOTE — ED Nurses Note (Signed)
Pt has vomited a large amount of indigested food and liquid. Pt states pain medication made her nauseated. Provider informed. Pt and bed cleaned, new linens and gown placed on pt.

## 2021-12-23 NOTE — ED Nurses Note (Signed)
Health Team at bedside for transport to [facility]. Report given to [facility] and pt dc'd to same. Pt is  A& O x3. Resp even and unlabored. Skin warm and dry. Pt reports nausea has improved. 22 g to right hand Sl'd with ASX site. Pt belongings bagged and sent to with pt, including personal cane.

## 2021-12-23 NOTE — ED Nurses Note (Signed)
Report given to Brantley Stage, RN at [facility] ED

## 2021-12-23 NOTE — ED Attending Handoff Note (Signed)
Jacqulyn Ducking, MD  [facility] Emergency Department  ED Attending Sign-Out Note                               ED PROGRESS NOTE / [place]     23:00  Patient was signed out to be my Dr. Renaldo Reel please please refer to her H and P for further details.    00:15  I spoke with general surgery, Dr. Rudy Jew, who states that there is no acute surgical process and that the patient can be safely admitted to the medicine service overnight with surgical consult in the morning.  I spoke with the admission team here at [facility], Dr. Marina Gravel who tells me the primary team was told not to admit anyone to our hospital if there is no surgical coverage here at [facility].  I spoke with Dr.Mayueir at [facility].  This is where the patient has requested to be transferred if transfer is needed.  He was agreeable to accept the patient to the emergency department.  He was concerned that this was not an appropriate transfer given that there is surgical services available in the morning and that there is no acute surgical intervention necessary tonight.      ED Course as of 12/23/21 0024   Thu Dec 22, 2021   2316 CT ABDOMEN PELVIS W IV CONTRAST       Pre-Disposition Vitals:  Filed Vitals:    12/22/21 2115 12/22/21 2145 12/22/21 2230 12/22/21 2315   BP: (!) 143/75 (!) 150/83 138/75 131/73   Pulse:  74 82 84   Resp: (!) '25 16 18 '$ (!) 22   Temp:   36.8 C (98.2 F)    SpO2: 99% 100% 95% 96%                                                               CLINICAL IMPRESSION     Clinical Impression   SBO (small bowel obstruction) (CMS HCC) (Primary)   Abnormal CT of the abdomen                                                                 DISPOSITION/PLAN     Transfered to Another Facility        Prescriptions:  New Prescriptions    No medications on file         Follow Up:  No follow-up provider specified.      Condition on Disposition: Stable

## 2021-12-23 NOTE — Progress Notes (Signed)
DAILY PROGRESS NOTE - Trauma Acute Care Surgery (TACS)   Name:  Nicole Sutton, Nicole Sutton     DOB:  07-04-1950   MR#:  [id number]               ROOM: 451/451-A    DATE:  12/23/21      Principal Diagnosis:  SBO (small bowel obstruction)    Refer to below for diagnoses being addressed for this encounter       ASSESSMENT & PLAN:                                                              Hospital Day: 1    s/p SBO possibly due to constipation, questionable acute appendicitis   CONDITION:  stable    Patient Active Hospital Problem List:         SBO (small bowel obstruction) (12/23/2021)  possibly likely due to constipation or separate SBO        Assessment: stable, ongoing, very painful, no return of bowel function         Plan: give enema now, follow labs and follow xray in the morning, pain control->schedule Toradol, d/c Morphine and replace with Dilaudid, NPO with ice chips and IVF, continue NGT      Acute appendicitis (12/23/2021)           Assessment: stable, CT looks less likely         Plan: continue IV antibiotics if there is some inflammation as treatment    Has a h/o abdominal sarcoma and associated surgeries.             Nicole Kindred, NP    Juliann Pares [phone]  [facility] Trauma Acute Care Surgery Program  Phone [phone] or Pager [phone]    Additional Info:  Care plan and expectations reviewed with Patient  Questions were answered.  Discussed with bedside RN    Subjective/Chief Complaint & ROS:   Very painful especially on right side, denies nausea, flatus     24-hour interval history:  admitted     Meds:   acetaminophen, 1,000 mg, Intravenous, Q6H  enoxaparin, 40 mg, Subcutaneous, Q24H  famotidine, 20 mg, Intravenous, Q12H SCH  ketorolac, 15 mg, Intravenous, 4 times per day  piperacillin-tazobactam, 3.375 g, Intravenous, Q6H  sodium chloride (PF), 3 mL, Intravenous, Q12H SCH        Infusions:   lactated ringers 125 mL/hr at 12/23/21 9518        DVT Prophylaxis:  enoxaparin 40 mg SQ Daily and SCDs  Medication VTE Prophylaxis  Orders: enoxaparin (LOVENOX) syringe 40 mg  Mechanical VTE Prophylaxis Orders: Mechanical VTE: Pneumatic Compression; Knee high    Foley:  No          GI Prophylaxis:  Yes      BM last 24 Hours:  Yes   Bowel Regimen:  No, enemas only   Diet:  Diet NPO effective now - Surgery/Procedure; Sips with Meds allowed     I & O's:  Data reviewed NGT - (and emesis) 500  Pain: poorly controlled  EXAM:   Vitals:  Blood pressure 124/62, pulse 91, temperature 98.1 F (36.7 C), temperature source Temporal, resp. rate 19, weight 77 kg (169 lb 12.1 oz), SpO2 96 %.   General:   in no  apparent distress, thin  Pulmonary:   lungs clear to ausculation, equal breath sounds, unlabored respirations, no rales/rhonchi/wheezes, no accessory muscle use  Cardiac:   regular rate and rhythm without murmurs/rubs/gallops  Abdomen:   hypoactive bowel sounds, soft, moderate generalized tenderness without rebound and without guarding, mild to moderately distended  Neuro:   alert, no gross focal neurologic deficits, moves all extremities well  Psych:   normal affect, insight good  Extremities:   no edema and SMAE  Skin:   no jaundice, no rashes, normothermic    Lab Data Reviewed:  Yes - WBC 13    Cultures:  None.      CHEM:     Recent Labs   Lab 12/23/21  0645   Glucose 145*   Sodium 141   Potassium 4.2   Chloride 107   CO2 23   BUN 29*   Creatinine 0.99   Calcium 9.3     CBC:       Recent Labs   Lab 12/23/21  0356   WBC 13.0*   Hemoglobin 11.8*   Hematocrit 39.3   PLT CT 240   MCV 102*     BANDS:      POCT:      LFTs:        COAG:      Lactate:    Recent Labs   Lab 12/23/21  0356   Lactic Acid 1.2       Radiology:   XR Abdomen 2 View    Result Date: 12/23/2021  Colonic constipation with mild dilatation of small bowel loops. Decompressed stomach with NG tube tip in the region of the gastric body. Surgical clips in the epigastric region. No gross free air. ReadingStation:[email or web address]    XR Abdomen Portable    Result Date: 12/23/2021  Sidehole and tip of NG tube  are in the body of the stomach ReadingStation:[email or web address]
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
## 2021-12-23 NOTE — Plan of Care (Addendum)
Problem: Altered GI Function  Goal: Fluid and electrolyte balance are achieved/maintained  Outcome: Progressing  Flowsheets (Taken 12/23/2021 1458)  Fluid and electrolyte balance are achieved/maintained:   Monitor intake and output every shift   Monitor/assess lab values and report abnormal values   Provide adequate hydration   Assess for confusion/personality changes   Assess and reassess fluid and electrolyte status   Observe for seizure activity and initiate seizure precautions if indicated   Monitor for muscle weakness  Goal: Elimination patterns are normal or improving  Outcome: Progressing  Flowsheets (Taken 12/23/2021 1458)  Elimination patterns are normal or improving:   Report abnormal assessment to physician   Anticipate/assist with toileting needs   Monitor for abdominal distension   Assess for normal bowel sounds   Monitor for abdominal discomfort   Assess for signs and symptoms of bleeding.  Report signs of bleeding to physician   Administer treatments as ordered   Assess for flatus   Administer medications to improve bowel evacuation as prescribed   Encourage /perform oral hygiene as appropriate  Goal: Mobility/Activity is maintained at optimal level for patient  Outcome: Progressing  Flowsheets (Taken 12/23/2021 1458)  Mobility/activity is maintained at optimal level for patient:   Increase mobility as tolerated/progressive mobility   Encourage independent activity per ability   Plan activities to conserve energy, plan rest periods   Reposition patient every 2 hours and as needed unless able to reposition self   Assess for changes in respiratory status, level of consciousness and/or development of fatigue   Consult/collaborate with Physical Therapy and/or Occupational Therapy

## 2021-12-23 NOTE — Progress Notes (Addendum)
NURSE NOTE SUMMARY  [facility] - 4TH SURGICAL   Patient Name: Nicole Sutton, Nicole Sutton   Attending Physician: Rea College, MD   Today's date:   12/23/2021 LOS: 0 days   Shift Summary:                                                              Assumed care of pt. Pt AO x4, on RA, VSS. Fluids infusing per MAR. Pt up x1 assist to [facility] for voiding, malodorous urine. NG tube to suction, no new output this shift. No nausea this shift. Pt given small amount of ice chips this shift, tolerated well. Pain well controlled this shift. All needs met this shift, call bell within reach; uses appropriately.     7:43 AM- pt down to xray    12:40- milk of molasses enema given. Pt produced one medium/large BM afterwards, soft in appearance.        Provider Notifications:        Rapid Response Notifications:  Mobility:          Weight tracking:  Family Dynamic:   Last 3 Weights for the past 72 hrs (Last 3 readings):   Weight   12/23/21 0218 77 kg (169 lb 12.1 oz)             Last Bowel Movement   No data recorded

## 2021-12-23 NOTE — Nursing Progress Note (Addendum)
Patient arrived to unit around 0500. Patient vomited 300cc shortly after arrival. NG tube inserted with 200cc immediate output. Patient A&Ox4. Walks with cane at baseline. Skin intact, assessed 4 eyes in 4 hours with PCT [facility].
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
## 2021-12-23 NOTE — Progress Notes (Signed)
Readmission Risk  [facility] - 4TH SURGICAL   Patient Name: Nicole Sutton, Nicole Sutton   Attending Physician: Ignacia Bayley, MD   Today's date:   12/23/2021 LOS: 0 days   Expected Discharge Date      Readmission Assessment:                                                              Discharge Planning  ReAdmit Risk Score: 6.67  Does the patient have perscription coverage?: Yes  Utilize [place] Med Program: No  Confirmed PCP with Pt: Yes  Confirmed PCP name: Jewel Baize  Confirm Transport to F/U Appt.: Self/Private Vehicle/Friend  Social Work Referral: Not Applicable  Anticipated Home Health at [place]: No  Anticipated Placement at [place]: No    CM Comments: 12/23/21 SW RJ: Adm with SBO vs constipation. NPO. N/V, NGT. ABx. Pt independent PTA. Lives alone, children provide support when needed. No [place] needs identified. Please consult SW if needs arise. SW will follow.    (RETIRED) Healthcare Agent's Name: Nicole Sutton, daughter or Nicole Sutton, son  (RETIRED) Healthcare Agent's Phone Number: [phone] or [phone]       IDPA:   Patient Type  Within 30 Days of Previous Admission?: No  Healthcare Decisions  Interviewed:: Patient  Orientation/Decision Making Abilities of Patient: Alert and Oriented x3, able to make decisions  (RETIRED) Healthcare Agent's Name: Nicole Sutton, daughter or Nicole Sutton, son  (RETIRED) Healthcare Agent's Phone Number: [phone] or [phone]  Prior to admission  Prior level of function: Independent with ADLs, Ambulates independently  Type of Residence: Private residence  Home Layout: Two level, Stairs to enter without rails (add number in comment), Ramped entrance (2 STE)  Have running water, electricity, heat, etc?: Yes  Living Arrangements: Alone  How do you get to your MD appointments?: Self  How do you get your groceries?: Self  Who fixes your meals?: Self  Who does your laundry?: Self  Who picks up your prescriptions?: Self  Home Care/Community Services: None  Discharge  Planning  Support Systems: Children, Family members  Patient expects to be discharged to:: Home  Anticipated [place] plan discussed with:: Same as interviewed  Mode of transportation:: Private car (family member)  Does the patient have perscription coverage?: Yes  Consults/Providers  Correct PCP listed in Epic?: Yes  Family and PCP  PCP on file was verified as the current PCP?: Yes   30 Day Readmission:       Provider Notifications:             Harolyn Rutherford, MSW  Social Worker  [facility]

## 2021-12-23 NOTE — ED Notes (Addendum)
[facility] EMERGENCY DEPARTMENT  ED NURSING NOTE FOR THE RECEIVING INPATIENT NURSE   ED NURSE PHONE: [phone]    ADMISSION INFORMATION   Nicole Sutton is a 71 y.o. female admitted with an ED diagnosis of:  1. Small bowel obstruction    2. Pain      ED Pre-Departure Assessment:  pt resting quietly, pt reports she is still having pain, but was able to sleep with current medication. Pt vs stable, bp elevated from arrival. Pts fluids are running wide open, pt required NC while in the ED, but does not wear O2 at home   NURSING CARE   Isolation No active isolations   Current O2 Device None (Room air)     Home O2     Patient Comes From: Home Independent   Documents accompanying patient: not applicable   Mental Status: alert and oriented   Ambulation: 1 person assist   Pertinent Info/Safety Concern: None   Report called to receiving RN?: No   ED nurse will obtain a full set of vital signs including temperature and document prior to patient departure
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
## 2021-12-24 ENCOUNTER — Inpatient Hospital Stay: Payer: Medicare PPO

## 2021-12-24 DIAGNOSIS — K219 Gastro-esophageal reflux disease without esophagitis: Secondary | ICD-10-CM | POA: Diagnosis present

## 2021-12-24 LAB — CBC AND DIFFERENTIAL
Basophils %: 0.1 % (ref 0.0–3.0)
Basophils Absolute: 0 10*3/uL (ref 0.0–0.3)
Eosinophils %: 0.7 % (ref 0.0–7.0)
Eosinophils Absolute: 0.1 10*3/uL (ref 0.0–0.8)
Hematocrit: 33.6 % — ABNORMAL LOW (ref 36.0–48.0)
Hemoglobin: 10.1 gm/dL — ABNORMAL LOW (ref 12.0–16.0)
Lymphocytes Absolute: 1.4 10*3/uL (ref 0.6–5.1)
Lymphocytes: 14.1 % — ABNORMAL LOW (ref 15.0–46.0)
MCH: 31 pg (ref 28–35)
MCHC: 30 gm/dL — ABNORMAL LOW (ref 32–36)
MCV: 102 fL — ABNORMAL HIGH (ref 80–100)
MPV: 10.2 fL — ABNORMAL HIGH (ref 6.0–10.0)
Monocytes Absolute: 0.6 10*3/uL (ref 0.1–1.7)
Monocytes: 5.7 % (ref 3.0–15.0)
Neutrophils %: 79.4 % — ABNORMAL HIGH (ref 42.0–78.0)
Neutrophils Absolute: 7.7 10*3/uL (ref 1.7–8.6)
PLT CT: 208 10*3/uL (ref 130–440)
RBC: 3.28 10*6/uL — ABNORMAL LOW (ref 3.80–5.00)
RDW: 12.5 % (ref 11.0–14.0)
WBC: 9.7 10*3/uL (ref 4.0–11.0)

## 2021-12-24 LAB — BASIC METABOLIC PANEL
Anion Gap: 11.9 mMol/L (ref 7.0–18.0)
BUN / Creatinine Ratio: 26.1 Ratio (ref 10.0–30.0)
BUN: 24 mg/dL — ABNORMAL HIGH (ref 7–22)
CO2: 25 mMol/L (ref 20–30)
Calcium: 9 mg/dL (ref 8.5–10.5)
Chloride: 108 mMol/L (ref 98–110)
Creatinine: 0.92 mg/dL (ref 0.60–1.20)
EGFR: 66 mL/min/{1.73_m2} (ref 60–150)
Glucose: 93 mg/dL (ref 71–99)
Osmolality Calculated: 285 mOsm/kg (ref 275–300)
Potassium: 3.9 mMol/L (ref 3.5–5.3)
Sodium: 141 mMol/L (ref 136–147)

## 2021-12-24 LAB — PHOSPHORUS: Phosphorus: 2.6 mg/dL (ref 2.3–4.7)

## 2021-12-24 LAB — MAGNESIUM: Magnesium: 2 mg/dL (ref 1.6–2.6)

## 2021-12-24 MED ORDER — BISACODYL 10 MG RE SUPP
10.0000 mg | Freq: Once | RECTAL | Status: AC
Start: 2021-12-24 — End: 2021-12-24
  Administered 2021-12-24: 10 mg via RECTAL

## 2021-12-24 MED ORDER — IOHEXOL 240 MG/ML IJ SOLN
150.0000 mL | Freq: Once | INTRAMUSCULAR | Status: AC
Start: 2021-12-24 — End: 2021-12-24
  Administered 2021-12-24: 150 mL via ORAL

## 2021-12-24 NOTE — Nursing Progress Note (Signed)
Patient c/o nausea, having dry heaves at beginning of shift, even post zofran administration. On call MD Marcello Fennel paged. Order placed. Compazine effective.     Milk & Molasses enema administered per orders. Only small amount of liquid stool returned with enema. Tubing advanced as far as able while leaving enough tubing to infuse via gravity.     NG tube flushed with 50cc sterile water x1. Output noted in reflux valve. Flushed with 50cc of air. Additional 100cc of output received from NG tube immediately. Patient also vomited 100cc of output this morning. Severe pain returned to patient's right lower abdomen. PRN morphine administered. Patient now resting.
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
## 2021-12-24 NOTE — Progress Notes (Signed)
DAILY PROGRESS NOTE - Trauma Acute Care Surgery (TACS)   Name:  Nicole Sutton, CUELLO     DOB:  Sep 18, 1950   MR#:  [id number]               ROOM: 451/451-A    DATE:  12/24/21      Principal Diagnosis:  SBO (small bowel obstruction)    Refer to below for diagnoses being addressed for this encounter       ASSESSMENT & PLAN:                                                              Hospital Day: 2    s/p SBO possibly due to constipation, questionable acute appendicitis   CONDITION:  stable    Patient Active Hospital Problem List:         SBO (small bowel obstruction) (12/23/2021)  possibly likely due to constipation or separate SBO        Assessment: improving, still painful and nauseated but better than yesterday        Plan: flush NGT and continue to suction, f/up on xray--no more SB distension just showing constipation, so likely aggressive bowel regimen from above and below, OOB->PT/OT, follow labs, continue IVF      Acute appendicitis (12/23/2021)           Assessment: stable, CT looks less likely         Plan: continue IV antibiotics if there is some inflammation as treatment    Has a h/o abdominal sarcoma and associated surgeries.             Nicole Kindred, NP    Juliann Pares [phone]  [facility] Trauma Acute Care Surgery Program  Phone [phone] or Pager [phone]    Additional Info:  Care plan and expectations reviewed with Patient  Questions were answered.  Discussed with bedside RN    Subjective/Chief Complaint & ROS:   Pain is better, nausea continues, some passing of gas     24-hour interval history:  enemas, BM     Meds:   acetaminophen, 1,000 mg, Intravenous, Q6H  enoxaparin, 40 mg, Subcutaneous, Q24H  famotidine, 20 mg, Intravenous, Q12H SCH  ketorolac, 15 mg, Intravenous, 4 times per day  piperacillin-tazobactam, 3.375 g, Intravenous, Q6H  sodium chloride (PF), 3 mL, Intravenous, Q12H SCH        Infusions:   lactated ringers 125 mL/hr at 12/24/21 0316        DVT Prophylaxis:  enoxaparin 40 mg SQ Daily and SCDs  Medication  VTE Prophylaxis Orders: enoxaparin (LOVENOX) syringe 40 mg  Mechanical VTE Prophylaxis Orders: Mechanical VTE: Pneumatic Compression; Knee high    Foley:  No          GI Prophylaxis:  Yes      BM last 24 Hours:  Yes   Bowel Regimen:  No, enemas only   Diet:  Diet NPO effective now - Surgery/Procedure; Sips with Meds allowed     I & O's:  Data reviewed NGT -250 + emesis around tube   Pain: poorly controlled  EXAM:   Vitals:  Blood pressure 133/67, pulse 94, temperature 97.7 F (36.5 C), temperature source Temporal, resp. rate 14, height 1.702 m (5\' 7" ), weight 77 kg (169 lb 12.1 oz), SpO2 95 %.  General:   in no apparent distress, thin  Pulmonary:   lungs clear to ausculation, equal breath sounds, unlabored respirations, no rales/rhonchi/wheezes, no accessory muscle use  Cardiac:   regular rate and rhythm without murmurs/rubs/gallops  Abdomen:   hypoactive bowel sounds, soft, mild generalized tenderness without rebound and without guarding, mildly distended  Neuro:   alert, no gross focal neurologic deficits, moves all extremities well  Psych:   normal affect, insight good  Extremities:   no edema and SMAE  Skin:   no jaundice, no rashes, normothermic    Lab Data Reviewed:  Yes - WBC 9    Cultures:  None.      CHEM:     Recent Labs   Lab 12/24/21  0425 12/23/21  0645   Glucose 93 145*   Sodium 141 141   Potassium 3.9 4.2   Chloride 108 107   CO2 25 23   BUN 24* 29*   Creatinine 0.92 0.99   Calcium 9.0 9.3   Magnesium 2.0  --    Phosphorus 2.6  --        CBC:       Recent Labs   Lab 12/24/21  0425 12/23/21  0356   WBC 9.7 13.0*   Hemoglobin 10.1* 11.8*   Hematocrit 33.6* 39.3   PLT CT 208 240   MCV 102* 102*       BANDS:      POCT:      LFTs:        COAG:      Lactate:    Recent Labs   Lab 12/23/21  0356   Lactic Acid 1.2         Radiology:   XR Abdomen 2 View    Result Date: 12/24/2021  1.  There is a moderate amount of stool in the colon, compatible with constipation. No free air or abnormally distended small bowel  is identified. 2.  Nasogastric tube tip and side-port project over the expected location of the stomach. ReadingStation:[email or web address]

## 2021-12-24 NOTE — Progress Notes (Addendum)
NURSE NOTE SUMMARY  [facility] - 4TH SURGICAL   Patient Name: Nicole Sutton, Nicole Sutton   Attending Physician: Ignacia Bayley, MD   Today's date:   12/24/2021 LOS: 1 days   Shift Summary:                                                              7:46 AM- pt complaining of nausea, PRN Compazine given. Pt being taken down for Xray now.     Assumed care of pt. Pt AO x4, on RA, VSS. Fluids infusing per MAR. Pt up x1 assist to [facility] for voiding, malodorous urine and only voided 300 mL. MD aware--staff to scan as algorithm deems necessary and encourage voiding.  NG tube to suction, 50 mL of brown-ish output this shift. Slight nausea this shift towards the beginning of the day. Pt given small amount of ice chips this shift, tolerated well. Pain well controlled this shift. All needs met this shift, call bell within reach; uses appropriately.       Provider Notifications:        Rapid Response Notifications:  Mobility:      PMP Activity: Step 5 - Chair; Step 6 - Walks in Room (12/23/2021  8:40 AM)     Weight tracking:  Family Dynamic:   Last 3 Weights for the past 72 hrs (Last 3 readings):   Weight   12/23/21 1125 77 kg (169 lb 12.1 oz)   12/23/21 0218 77 kg (169 lb 12.1 oz)             Last Bowel Movement   Last BM Date: 12/23/21

## 2021-12-24 NOTE — Plan of Care (Addendum)
Problem: Altered GI Function  Goal: Elimination patterns are normal or improving  Outcome: Not Progressing  Flowsheets (Taken 12/23/2021 1458)  Elimination patterns are normal or improving:   Report abnormal assessment to physician   Anticipate/assist with toileting needs   Monitor for abdominal distension   Assess for normal bowel sounds   Monitor for abdominal discomfort   Assess for signs and symptoms of bleeding.  Report signs of bleeding to physician   Administer treatments as ordered   Assess for flatus   Administer medications to improve bowel evacuation as prescribed   Encourage /perform oral hygiene as appropriate     Problem: Moderate/High Fall Risk Score >5  Goal: Patient will remain free of falls  Outcome: Progressing  Flowsheets (Taken 12/24/2021 0748)  VH High Risk (Greater than 13):   ALL REQUIRED LOW INTERVENTIONS   ALL REQUIRED MODERATE INTERVENTIONS   RED "HIGH FALL RISK" SIGNAGE   BED ALARM WILL BE ACTIVATED WHEN THE PATEINT IS IN BED WITH SIGNAGE "RESET BED ALARM"   A CHAIR PAD ALARM WILL BE USED WHEN PATIENT IS UP SITTING IN A CHAIR   PATIENT IS TO BE SUPERVISED FOR ALL TOILETING ACTIVITIES

## 2021-12-25 ENCOUNTER — Inpatient Hospital Stay: Payer: Medicare PPO

## 2021-12-25 LAB — CBC AND DIFFERENTIAL
Basophils %: 0.5 % (ref 0.0–3.0)
Basophils Absolute: 0 10*3/uL (ref 0.0–0.3)
Eosinophils %: 1.9 % (ref 0.0–7.0)
Eosinophils Absolute: 0.2 10*3/uL (ref 0.0–0.8)
Hematocrit: 34.9 % — ABNORMAL LOW (ref 36.0–48.0)
Hemoglobin: 11 gm/dL — ABNORMAL LOW (ref 12.0–16.0)
Lymphocytes Absolute: 1.7 10*3/uL (ref 0.6–5.1)
Lymphocytes: 17.9 % (ref 15.0–46.0)
MCH: 32 pg (ref 28–35)
MCHC: 31 gm/dL — ABNORMAL LOW (ref 32–36)
MCV: 103 fL — ABNORMAL HIGH (ref 80–100)
MPV: 10.1 fL — ABNORMAL HIGH (ref 6.0–10.0)
Monocytes Absolute: 0.7 10*3/uL (ref 0.1–1.7)
Monocytes: 7.1 % (ref 3.0–15.0)
Neutrophils %: 72.6 % (ref 42.0–78.0)
Neutrophils Absolute: 6.7 10*3/uL (ref 1.7–8.6)
PLT CT: 217 10*3/uL (ref 130–440)
RBC: 3.4 10*6/uL — ABNORMAL LOW (ref 3.80–5.00)
RDW: 12.4 % (ref 11.0–14.0)
WBC: 9.2 10*3/uL (ref 4.0–11.0)

## 2021-12-25 LAB — BASIC METABOLIC PANEL
Anion Gap: 13.6 mMol/L (ref 7.0–18.0)
BUN / Creatinine Ratio: 26 Ratio (ref 10.0–30.0)
BUN: 26 mg/dL — ABNORMAL HIGH (ref 7–22)
CO2: 25.3 mMol/L (ref 20.0–30.0)
Calcium: 9.8 mg/dL (ref 8.5–10.5)
Chloride: 101 mMol/L (ref 98–110)
Creatinine: 1 mg/dL (ref 0.60–1.20)
EGFR: 60 mL/min/{1.73_m2} (ref 60–150)
Glucose: 84 mg/dL (ref 71–99)
Osmolality Calculated: 276 mOsm/kg (ref 275–300)
Potassium: 3.9 mMol/L (ref 3.5–5.3)
Sodium: 136 mMol/L (ref 136–147)

## 2021-12-25 LAB — PHOSPHORUS: Phosphorus: 2.4 mg/dL (ref 2.3–4.7)

## 2021-12-25 LAB — MAGNESIUM: Magnesium: 1.9 mg/dL (ref 1.6–2.6)

## 2021-12-25 MED ORDER — ACETAMINOPHEN 10 MG/ML IV SOLN
1000.0000 mg | Freq: Four times a day (QID) | INTRAVENOUS | Status: AC
Start: 2021-12-25 — End: 2021-12-28
  Administered 2021-12-25 – 2021-12-27 (×8): 1000 mg via INTRAVENOUS

## 2021-12-25 MED ORDER — VH LACTATED RINGERS IV BOLUS
1000.0000 mL | Freq: Once | INTRAVENOUS | Status: AC
Start: 2021-12-25 — End: 2021-12-25
  Administered 2021-12-25: 1000 mL via INTRAVENOUS

## 2021-12-25 MED ORDER — KCL IN DEXTROSE-NACL 20-5-0.45 MEQ/L-%-% IV SOLN
INTRAVENOUS | Status: DC
Start: 2021-12-25 — End: 2021-12-29
  Filled 2021-12-25 (×11): qty 1000

## 2021-12-25 NOTE — Progress Notes (Signed)
DAILY PROGRESS NOTE - Trauma Acute Care Surgery (TACS)   Name:  Nicole, Sutton     DOB:  May 10, 1951   MR#:  [id number]               ROOM: 451/451-A    DATE:  12/25/21      Principal Diagnosis:  SBO (small bowel obstruction)    Refer to below for diagnoses being addressed for this encounter       ASSESSMENT & PLAN:                                                              Hospital Day: 3    s/p SBO possibly due to constipation, questionable acute appendicitis   CONDITION:  stable    Patient Active Hospital Problem List:         SBO (small bowel obstruction) (12/23/2021)  possibly likely due to constipation or separate SBO        Assessment: worsening, pain slightly better, but nauseated and did not really tolerate the contrast, a lot of output when hooked back up to suction after xray        Plan: continue NGT to suction, f/up on xray--eval contrast from last nigt, OOB->PT/OT, follow labs, continue IVF->change to D5     Acute appendicitis (12/23/2021)           Assessment: stable, CT looks less likely         Plan: continue IV antibiotics if there is some inflammation as treatment, follow labs     Has a h/o abdominal sarcoma and associated surgeries.   She did receive bolus this morning for low urine output (might not have been recorded fully/accurately), tachycardia and large NGT output.     1200-f/up from xray this morning.  Still appears obstructed.  Will need to plan for OR tomorrow, sooner if needed for clinical status.              Nicole Kindred, NP    Juliann Pares [phone]  [facility] Trauma Acute Care Surgery Program  Phone [phone] or Pager [phone]    Additional Info:  Care plan and expectations reviewed with Patient  Questions were answered.  Discussed with bedside RN    Subjective/Chief Complaint & ROS:   Pain is slightly better, not passing gas or stool.  Nauseated when clamped for contrast study.     24-hour interval history:  contrast study      Meds:   acetaminophen, 1,000 mg, Intravenous, Q6H  enoxaparin,  40 mg, Subcutaneous, Q24H  famotidine, 20 mg, Intravenous, Q12H SCH  ketorolac, 15 mg, Intravenous, 4 times per day  piperacillin-tazobactam, 3.375 g, Intravenous, Q6H  sodium chloride (PF), 3 mL, Intravenous, Q12H SCH        Infusions:   dextrose 5 % and 0.45 % NaCl with KCl 20 mEq          DVT Prophylaxis:  enoxaparin 40 mg SQ Daily and SCDs  Medication VTE Prophylaxis Orders: enoxaparin (LOVENOX) syringe 40 mg  Mechanical VTE Prophylaxis Orders: Mechanical VTE: Pneumatic Compression; Knee high    Foley:  No          GI Prophylaxis:  Yes      BM last 24 Hours:   no, 7/21    Bowel  Regimen:  No   Diet:  Diet NPO effective now - Surgery/Procedure; Sips with Meds allowed     I & O's:  Data reviewed NGT -50/1800; UO- 0/300/300+   Pain: poorly controlled  EXAM:   Vitals:  Blood pressure 142/74, pulse 91, temperature 97.9 F (36.6 C), temperature source Temporal, resp. rate 16, height 1.702 m (5\' 7" ), weight 77 kg (169 lb 12.1 oz), SpO2 97 %.   General:   in no apparent distress, thin  Pulmonary:   lungs clear to ausculation, equal breath sounds, unlabored respirations, no rales/rhonchi/wheezes, no accessory muscle use  Cardiac:   regular rate and rhythm without murmurs/rubs/gallops  Abdomen:   hypoactive bowel sounds, soft, mild to moderate right sided tenderness without rebound and without guarding, mildly distended  Neuro:   alert, no gross focal neurologic deficits, moves all extremities well  Psych:   normal affect, insight good  Extremities:   no edema and SMAE  Skin:   no jaundice, no rashes, normothermic    Lab Data Reviewed:  Yes - WBC 9    Cultures:  None.      CHEM:     Recent Labs   Lab 12/25/21  1109 12/25/21  0545 12/24/21  0425 12/23/21  0645   Glucose 84  --  93 145*   Sodium 136  --  141 141   Potassium 3.9  --  3.9 4.2   Chloride 101  --  108 107   CO2 25.3  --  25 23   BUN 26*  --  24* 29*   Creatinine 1.00  --  0.92 0.99   Calcium 9.8  --  9.0 9.3   Magnesium  --  1.9 2.0  --    Phosphorus  --  2.4  2.6  --        CBC:       Recent Labs   Lab 12/25/21  1109 12/24/21  0425 12/23/21  0356   WBC 9.2 9.7 13.0*   Hemoglobin 11.0* 10.1* 11.8*   Hematocrit 34.9* 33.6* 39.3   PLT CT 217 208 240   MCV 103* 102* 102*       BANDS:      POCT:      LFTs:        COAG:      Lactate:    Recent Labs   Lab 12/23/21  0356   Lactic Acid 1.2         Radiology:   XR Abdomen 2 View    Result Date: 12/25/2021  There are multiple dilated loops of small bowel measuring up to 5 cm in diameter, compatible with provided history of obstruction. There is no appreciable contrast in the colon. No free air is seen. ReadingStation:[email or web address]
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
## 2021-12-25 NOTE — Plan of Care (Signed)
NURSE NOTE SUMMARY  [facility] - 4TH SURGICAL   Patient Name: Nicole Sutton, Nicole Sutton   Attending Physician: Ignacia Bayley, MD   Today's date:   12/25/2021 LOS: 2 days   Shift Summary:                                                                At about 2000 patient had an episode of emesis of about 360 ml and greenish in color. She got co,azine which helped. At about 2340 she completed her omnipaque  and her NG tube was clamped till she went for x-ray. She tolerated the procedure  with no emesis.    Provider Notifications:        Rapid Response Notifications:  Mobility:      PMP Activity: Step 6 - Walks in Room (12/25/2021  6:03 AM)     Weight tracking:  Family Dynamic:   Last 3 Weights for the past 72 hrs (Last 3 readings):   Weight   12/23/21 1125 77 kg (169 lb 12.1 oz)   12/23/21 0218 77 kg (169 lb 12.1 oz)             Last Bowel Movement   Last BM Date: 12/23/21

## 2021-12-25 NOTE — Plan of Care (Addendum)
NURSE NOTE SUMMARY  [facility] - 4TH SURGICAL   Patient Name: Nicole Sutton, Nicole Sutton   Attending Physician: Ignacia Bayley, MD   Today's date:   12/25/2021 LOS: 2 days   Shift Summary:                                                              Assumed care of pt. Pt AO x4, on RA, VSS. Fluids infusing per MAR. Pt up x1 assist to [facility] for voiding, malodorous urine and marginal urine output. MD aware--staff to scan as algorithm deems necessary and encourage voiding.  NG tube to suction, hardly any output this shift, flushed to ensure patency. Slight nausea this shift. Pain noted to RLQ and radiating to flank area, somewhat controlled with PRN and scheduled meds. Heating pad also given to patient. Pt given small amount of ice chips this shift, tolerated well.. All needs met this shift, call bell within reach; uses appropriately.      Provider Notifications:        Rapid Response Notifications:  Mobility:      PMP Activity: Step 6 - Walks in Room; Step 5 - Chair (12/25/2021 12:00 PM)     Weight tracking:  Family Dynamic:   Last 3 Weights for the past 72 hrs (Last 3 readings):   Weight   12/23/21 1125 77 kg (169 lb 12.1 oz)   12/23/21 0218 77 kg (169 lb 12.1 oz)             Last Bowel Movement   Last BM Date: 12/23/21        Problem: Altered GI Function  Goal: Fluid and electrolyte balance are achieved/maintained  Outcome: Progressing  Flowsheets (Taken 12/23/2021 1458)  Fluid and electrolyte balance are achieved/maintained:   Monitor intake and output every shift   Monitor/assess lab values and report abnormal values   Provide adequate hydration   Assess for confusion/personality changes   Assess and reassess fluid and electrolyte status   Observe for seizure activity and initiate seizure precautions if indicated   Monitor for muscle weakness  Goal: Elimination patterns are normal or improving  Outcome: Not Progressing  Flowsheets (Taken 12/23/2021 1458)  Elimination patterns are normal or improving:    Report abnormal assessment to physician   Anticipate/assist with toileting needs   Monitor for abdominal distension   Assess for normal bowel sounds   Monitor for abdominal discomfort   Assess for signs and symptoms of bleeding.  Report signs of bleeding to physician   Administer treatments as ordered   Assess for flatus   Administer medications to improve bowel evacuation as prescribed   Encourage /perform oral hygiene as appropriate     Problem: Moderate/High Fall Risk Score >5  Goal: Patient will remain free of falls  Outcome: Progressing  Flowsheets (Taken 12/25/2021 1200)  VH High Risk (Greater than 13):   ALL REQUIRED LOW INTERVENTIONS   ALL REQUIRED MODERATE INTERVENTIONS   RED "HIGH FALL RISK" SIGNAGE   BED ALARM WILL BE ACTIVATED WHEN THE PATEINT IS IN BED WITH SIGNAGE "RESET BED ALARM"   A CHAIR PAD ALARM WILL BE USED WHEN PATIENT IS UP SITTING IN A CHAIR   PATIENT IS TO BE SUPERVISED FOR ALL TOILETING ACTIVITIES

## 2021-12-26 ENCOUNTER — Inpatient Hospital Stay: Payer: Medicare PPO

## 2021-12-26 DIAGNOSIS — C499 Malignant neoplasm of connective and soft tissue, unspecified: Secondary | ICD-10-CM | POA: Diagnosis present

## 2021-12-26 LAB — CBC AND DIFFERENTIAL
Basophils %: 0.7 % (ref 0.0–3.0)
Basophils Absolute: 0.1 10*3/uL (ref 0.0–0.3)
Eosinophils %: 0.9 % (ref 0.0–7.0)
Eosinophils Absolute: 0.1 10*3/uL (ref 0.0–0.8)
Hematocrit: 34.4 % — ABNORMAL LOW (ref 36.0–48.0)
Hemoglobin: 10.7 gm/dL — ABNORMAL LOW (ref 12.0–16.0)
Lymphocytes Absolute: 0.9 10*3/uL (ref 0.6–5.1)
Lymphocytes: 12.3 % — ABNORMAL LOW (ref 15.0–46.0)
MCH: 31 pg (ref 28–35)
MCHC: 31 gm/dL — ABNORMAL LOW (ref 32–36)
MCV: 101 fL — ABNORMAL HIGH (ref 80–100)
MPV: 10.2 fL — ABNORMAL HIGH (ref 6.0–10.0)
Monocytes Absolute: 0.6 10*3/uL (ref 0.1–1.7)
Monocytes: 7.5 % (ref 3.0–15.0)
Neutrophils %: 78.6 % — ABNORMAL HIGH (ref 42.0–78.0)
Neutrophils Absolute: 5.9 10*3/uL (ref 1.7–8.6)
PLT CT: 202 10*3/uL (ref 130–440)
RBC: 3.4 10*6/uL — ABNORMAL LOW (ref 3.80–5.00)
RDW: 12.3 % (ref 11.0–14.0)
WBC: 7.5 10*3/uL (ref 4.0–11.0)

## 2021-12-26 LAB — BASIC METABOLIC PANEL
Anion Gap: 17.2 mMol/L (ref 7.0–18.0)
BUN / Creatinine Ratio: 26.7 Ratio (ref 10.0–30.0)
BUN: 27 mg/dL — ABNORMAL HIGH (ref 7–22)
CO2: 21 mMol/L (ref 20–30)
Calcium: 9.2 mg/dL (ref 8.5–10.5)
Chloride: 102 mMol/L (ref 98–110)
Creatinine: 1.01 mg/dL (ref 0.60–1.20)
EGFR: 59 mL/min/{1.73_m2} — ABNORMAL LOW (ref 60–150)
Glucose: 119 mg/dL — ABNORMAL HIGH (ref 71–99)
Osmolality Calculated: 278 mOsm/kg (ref 275–300)
Potassium: 4.2 mMol/L (ref 3.5–5.3)
Sodium: 136 mMol/L (ref 136–147)

## 2021-12-26 LAB — PHOSPHORUS: Phosphorus: 2.1 mg/dL — ABNORMAL LOW (ref 2.3–4.7)

## 2021-12-26 LAB — MAGNESIUM: Magnesium: 1.9 mg/dL (ref 1.6–2.6)

## 2021-12-26 MED ORDER — SODIUM PHOSPHATES 3 MMOLE/ML IV SOLN (WRAP)
15.0000 mmol | Freq: Once | INTRAVENOUS | Status: AC
Start: 2021-12-26 — End: 2021-12-26
  Administered 2021-12-26: 15 mmol via INTRAVENOUS
  Filled 2021-12-26: qty 5

## 2021-12-26 MED ORDER — MELATONIN 3 MG PO TABS
3.0000 mg | ORAL_TABLET | Freq: Every evening | ORAL | Status: DC | PRN
Start: 2021-12-26 — End: 2021-12-30
  Administered 2021-12-26 – 2021-12-27 (×2): 3 mg via ORAL

## 2021-12-26 NOTE — Plan of Care (Signed)
NURSE NOTE SUMMARY  [facility] - 4TH SURGICAL   Patient Name: Nicole Sutton, Nicole Sutton   Attending Physician: Ignacia Bayley, MD   Today's date:   12/26/2021 LOS: 3 days   Shift Summary:                                                                0725 Pt transported to radiology for abdominal xray at this time.   0900 NG tube verified by auscultation. Placement marked at 61 cm. Flushed with 20 cc tap water. Pt tolerating well.  100 cc output recorded at this time. NG remains to LCWS.     1549 200 cc clear green NG output / 20 cc flush     Lungs clear to auscultation bilaterally. Pt in recliner, reports much more comfortable for back and abdomen.  Ambulates in room, gait even and steady. Ambulates in hallway with staff.     IV remains patent, D5 1/2 NaCL with KCL 20 mEq/L continues to infuse.  Sodium phosphate infused earlier in shift. Pt tolerated well.     Aqua heating pad ordered for abdominal discomfort, pt reports positive results. Dilaudid 0.5 mg administered X 2 during day shift for elevated pain.      Urine output 200 ml for day shift, bladder scan 66 ml, TACS aware, considering bolus, will continue to monitor.      Provider Notifications:        Rapid Response Notifications:  Mobility:      PMP Activity: Step 7 - Walks out of Room (12/26/2021 10:21 AM)     Weight tracking:  Family Dynamic:   No data found.          Last Bowel Movement   Last BM Date: 12/23/21        Problem: Altered GI Function  Goal: Fluid and electrolyte balance are achieved/maintained  Outcome: Progressing  Goal: Elimination patterns are normal or improving  Outcome: Progressing

## 2021-12-26 NOTE — Progress Notes (Signed)
Quick Doc  [facility] - 4TH SURGICAL   Patient Name: [facility]   Attending Physician: Ignacia Bayley, MD   Today's date:   12/26/2021 LOS: 3 days   Expected Discharge Date      Quick  Assessment:                                                              ReAdmit Risk Score: 8.78    CM Comments: 12/26/21 SW RJ: Adm with SBO vs constipation. NGT to suction. NPO. Planning for possible OR today.  Pt independent PTA. Lives alone, children provide support when needed. PT currently recommending home with supervision and OT recommending HH. Cannot have home OT as a stand alone discipline, will need either outpatient OT or both PT/OT at home. SW will follow clinical course for [place] planning needs.        Physical Discharge Disposition: Home                                   Mode of Transportation: Car                                   Provider Notifications:             Harolyn Rutherford, MSW  Social Worker  [facility]

## 2021-12-26 NOTE — Progress Notes (Signed)
DAILY PROGRESS NOTE - Trauma Acute Care Surgery (TACS)   Name:  Nicole Sutton, Nicole Sutton     DOB:  [birth year]   MR#:  [id number]               ROOM: 451/451-A    DATE:  12/26/21      Principal Diagnosis:  SBO (small bowel obstruction)    Refer to below for diagnoses being addressed for this encounter       ASSESSMENT & PLAN:                                                              Hospital Day: 4    s/p SBO possibly due to constipation, questionable acute appendicitis   CONDITION:  stable    Patient Active Hospital Problem List:         SBO (small bowel obstruction) (12/23/2021)  possibly likely due to constipation or separate SBO        Assessment: worsening, pain slightly better, xray this morning not any better, no return of bowel function        Plan: continue NGT to suction, OOB->PT/OT, follow labs, continue IVF, add Melatonin at HS (takes multiple medications at bedtime)     Acute appendicitis (12/23/2021)           Assessment: stable, CT looks less likely         Plan: continue IV antibiotics if there is some inflammation as treatment, follow labs     Has a h/o abdominal sarcoma and associated surgeries.      Dr. Reola Calkins in to see the patient this morning to make operative decision.  She is stable, however, she is not resolving her bowel obstruction.  She has a history of resolving a small bowel obstruction non-operatively in March 23, but it did take nine days.  Given her past surgery, another operation has the potential to be complex.  Decision currently being made to proceed to OR versus wait some more time for resolution.     Nicole Kindred, NP    Juliann Pares [phone]  [facility] Trauma Acute Care Surgery Program  Phone [phone] or Pager [phone]    Additional Info:  Care plan and expectations reviewed with Patient  Questions were answered.  Discussed with bedside RN    Subjective/Chief Complaint & ROS:   Pain is slightly better, not passing gas or stool.  Nauseated and vomited overnight.  Thick bilious output in  NGT.    24-hour interval history:  failed contrast study      Meds:   acetaminophen, 1,000 mg, Intravenous, Q6H  enoxaparin, 40 mg, Subcutaneous, Q24H  famotidine, 20 mg, Intravenous, Q12H SCH  piperacillin-tazobactam, 3.375 g, Intravenous, Q6H  sodium chloride (PF), 3 mL, Intravenous, Q12H SCH  sodium phosphate, 15 mmol, Intravenous, Once        Infusions:   dextrose 5 % and 0.45 % NaCl with KCl 20 mEq 100 mL/hr at 12/26/21 0121        DVT Prophylaxis:  enoxaparin 40 mg SQ Daily and SCDs  Medication VTE Prophylaxis Orders: enoxaparin (LOVENOX) syringe 40 mg  Mechanical VTE Prophylaxis Orders: Mechanical VTE: Pneumatic Compression; Knee high    Foley:  No          GI Prophylaxis:  Yes  BM last 24 Hours:   no, 7/21    Bowel Regimen:  No   Diet:  Diet NPO effective now - Surgery/Procedure; Sips with Meds allowed     I & O's:  Data reviewed NGT:  none recorded, but nursing report of overnight; UO- 225/x1/280     Pain: reasonably controlled  EXAM:   Vitals:  Blood pressure 139/79, pulse 81, temperature 98.6 F (37 C), temperature source Temporal, resp. rate 16, height 1.702 m (5\' 7" ), weight 77 kg (169 lb 12.1 oz), SpO2 95 %.   General:   in no apparent distress, thin  Pulmonary:   lungs clear to ausculation, equal breath sounds, unlabored respirations, no rales/rhonchi/wheezes, no accessory muscle use  Cardiac:   regular rate and rhythm without murmurs/rubs/gallops  Abdomen:   hypoactive bowel sounds, soft, mild right sided tenderness without rebound and without guarding, mildly distended  Neuro:   alert, no gross focal neurologic deficits, moves all extremities well  Psych:   normal affect, insight good  Extremities:   no edema and SMAE  Skin:   no jaundice, no rashes, normothermic    Lab Data Reviewed:  Yes - WBC 7.5; Phos 2.1  Cultures:  None.      CHEM:     Recent Labs   Lab 12/26/21  0401 12/25/21  1109 12/25/21  0545 12/24/21  0425 12/23/21  0645   Glucose 119* 84  --  93 145*   Sodium 136 136  --  141  141   Potassium 4.2 3.9  --  3.9 4.2   Chloride 102 101  --  108 107   CO2 21 25.3  --  25 23   BUN 27* 26*  --  24* 29*   Creatinine 1.01 1.00  --  0.92 0.99   Calcium 9.2 9.8  --  9.0 9.3   Magnesium 1.9  --  1.9 2.0  --    Phosphorus 2.1*  --  2.4 2.6  --        CBC:       Recent Labs   Lab 12/26/21  0401 12/25/21  1109 12/24/21  0425 12/23/21  0356   WBC 7.5 9.2 9.7 13.0*   Hemoglobin 10.7* 11.0* 10.1* 11.8*   Hematocrit 34.4* 34.9* 33.6* 39.3   PLT CT 202 217 208 240   MCV 101* 103* 102* 102*       BANDS:      POCT:      LFTs:        COAG:      Lactate:    Recent Labs   Lab 12/23/21  0356   Lactic Acid 1.2         Radiology:   XR Abdomen 2 View    Result Date: 12/26/2021  NG tube tip in the region of the gastric body. Contrast within the stomach and small bowel loops with dilated small bowel loops as on 12/25/2021. Nondilated colon. No free air identified. Postoperative change in the epigastric region. ReadingStation:[id number]

## 2021-12-26 NOTE — OT Progress Note (Signed)
[facility]:  [facility]   Patient: Nicole Sutton     CSN: [id number]    Bed: 451/451-A  Occupational Therapy PROGRESS note      Visit#: 2  Treatment Frequency: Discontinue OT services at this time.     DISCHARGE RECOMMENDATIONS:   DME recommended for Discharge:   Has needed equipment    Discharge Recommendations:   Home with supervision       Additional Recommendations or Precautions   Precautions, Contraindications, Awareness details:   Mobility protocol     Recommend client participate in BADL with staff and transfer to bathroom vs BSC for toileting outside of and in addition to OT session.    OT Assessment and Plan of Care:     HPI (per physician charting) and Pertinent Medical Details:  Asami Lambright is a 71 y.o. female admitted 12/23/2021 presenting with small bowel obstruction and questionable acute appendicitis       OT Assessment:  Patient's progress towards established goals: Mrs. Lacewell demonstrated improved activity tolerance and performance with BADL this session as compared to evaluation. She reported less pain this date which allowed for tolerance of more functional activity. At this time- she is performing transfers to the bathroom, toileting, dressing, and grooming tasks, including standing aspects with modified independence with use of SPC (with staff assisting with lines and tubes). As no plan for surgery this date as of this session per nsg this therapist- Changing recommendation for discharge to home with supervision based on physical performance at this time. No further skilled OT needs at this time. If change in functional status is observed by staff please notify therapy team for re-assessment. Recommending pt walk daily with mobility tech to continue to sustain/improve activity tolerance for BADL/IADL.     Goals:  Patient will achieve the following goals by 1 visit(s)  STG to LTG:   1. Patient will be Supervision for ADLs, Supervision for  functional transfers/mobility with use of AD/DME/AT/AE. MET     Treatment/interventions: No further skilled acute care interventions needed at this time.    Subjective:   I am not sure if they are going to do surgery. I am still in pain- it comes and goes in intensity.   Patient is agreeable to participation in the therapy session. Nursing clears patient for therapy.    Pain:  At Rest: 4 /10  With Activity: 5/10  Location: Abdomen  Interventions: None required    Objective:   Skin Inspection: intact in all visible areas    Vital Signs:   Stable with no signs/symptoms of distress    Oriented to: Oriented x4  Command following: Follows ALL commands and directions without difficulty  Alertness/Arousal: Appropriate responses to stimuli   Attention Span:Attends to task with redirection, Difficulty attending to directions  Memory: Appears intact  Safety Awareness: Independent  Insights: Decreased awareness of deficits  Problem Solving: Assistance required to identify errors made, Assistance required to generate solutions, Assistance required to implement solutions, minimal assistance  Assessment of new learning ability:  good      Behavior: Cooperative  Motor planning: Intact    Activities of Daily Living:   TOILETING:  Modified Independent with assistive device, Modified Independent with increased time, A for managing tubes and lines for standing; raised toilet, grab bar use       Shower stall transfer simulated modified independence with A only for tubes and lines.     Functional Mobility:   Bed Mobility:  Not tested  due to patient already OOB.    Transfers:  Toilet: Modified independence  A for tubes and lines with Single point cane.          Other Treatment Interventions:   Treatment Activities:   Compensation technique training            Education Provided:   Topics: Role of occupational therapy, plan of care, goals of therapy and safety with mobility and ADLs, use of adaptive equipment, normal recovery process in  regards to activity and ADLs after discharge from hospital .    Individuals educated: Patient.  Method: Explanation and Demonstration.  Response to education: Verbalized understanding and Demonstrated understanding.    Team Communication:   Spoke to: RN/LPN - Leana  Regarding: Pre-session re: patient status, Patient position at end of session, Discharge needs, Patient participation with Therapy  OT/COTA communication: via written note and verbal communication as needed.    Patient Position at End of Treatment:   Sitting, in a reclined position in chair, Family/visitors present, and No distress    Time of treatment:   Time Calculation  OT Received On: 12/26/21  Start Time: 1127  Stop Time: 1146  Time Calculation (min): 19 min  Total Treatment Time (min): 19    Davis Gourd, OT, OTR/L

## 2021-12-26 NOTE — Progress Notes (Signed)
MOBILITY NOTE:     Precautions: Weight Bearing: Full weight bearing     Activity:  Ambulated in the hall x110 feet     Level of Assistance: A little     Device(s) used: Cane     Positioning frequency: Turns self as needed    Last Vitals: BP 163/84   Pulse 96   Temp 98.4 F (36.9 C) (Temporal)   Resp 16   Ht 1.702 m (5\' 7" )   Wt 77 kg (169 lb 12.1 oz)   SpO2 96%   BMI 26.59 kg/m       Pt sitting up in chair, encouraged to go for a walk. C/o right side pain, no nausea or SOB. Offered cues to take it slow and steady. Pt back in room, sitting up in recliner. Call bell within reach. RN notified

## 2021-12-27 ENCOUNTER — Inpatient Hospital Stay: Payer: Medicare PPO

## 2021-12-27 ENCOUNTER — Inpatient Hospital Stay: Payer: Medicare PPO | Admitting: Certified Registered"

## 2021-12-27 ENCOUNTER — Encounter
Admission: EM | Disposition: A | Discharge status: Home / Self Care | Payer: Self-pay | Source: Other Acute Inpatient Hospital | Attending: Surgery

## 2021-12-27 DIAGNOSIS — K660 Peritoneal adhesions (postprocedural) (postinfection): Secondary | ICD-10-CM

## 2021-12-27 HISTORY — PX: ROBOT ASSISTED, LAPAROSCOPIC, OPERATIVE: SHX5505

## 2021-12-27 LAB — BASIC METABOLIC PANEL
Anion Gap: 12 mMol/L (ref 7.0–18.0)
BUN / Creatinine Ratio: 20.5 Ratio (ref 10.0–30.0)
BUN: 17 mg/dL (ref 7–22)
CO2: 24.6 mMol/L (ref 20.0–30.0)
Calcium: 8.5 mg/dL (ref 8.5–10.5)
Chloride: 101 mMol/L (ref 98–110)
Creatinine: 0.83 mg/dL (ref 0.60–1.20)
EGFR: 75 mL/min/{1.73_m2} (ref 60–150)
Glucose: 115 mg/dL — ABNORMAL HIGH (ref 71–99)
Osmolality Calculated: 271 mOsm/kg — ABNORMAL LOW (ref 275–300)
Potassium: 3.6 mMol/L (ref 3.5–5.3)
Sodium: 134 mMol/L — ABNORMAL LOW (ref 136–147)

## 2021-12-27 LAB — CBC AND DIFFERENTIAL
Basophils %: 0.3 % (ref 0.0–3.0)
Basophils Absolute: 0 10*3/uL (ref 0.0–0.3)
Eosinophils %: 4.2 % (ref 0.0–7.0)
Eosinophils Absolute: 0.3 10*3/uL (ref 0.0–0.8)
Hematocrit: 35.3 % — ABNORMAL LOW (ref 36.0–48.0)
Hemoglobin: 11.1 gm/dL — ABNORMAL LOW (ref 12.0–16.0)
Lymphocytes Absolute: 1.1 10*3/uL (ref 0.6–5.1)
Lymphocytes: 13.9 % — ABNORMAL LOW (ref 15.0–46.0)
MCH: 32 pg (ref 28–35)
MCHC: 31 gm/dL — ABNORMAL LOW (ref 32–36)
MCV: 103 fL — ABNORMAL HIGH (ref 80–100)
MPV: 10.1 fL — ABNORMAL HIGH (ref 6.0–10.0)
Monocytes Absolute: 0.9 10*3/uL (ref 0.1–1.7)
Monocytes: 11.6 % (ref 3.0–15.0)
Neutrophils %: 70 % (ref 42.0–78.0)
Neutrophils Absolute: 5.7 10*3/uL (ref 1.7–8.6)
PLT CT: 205 10*3/uL (ref 130–440)
RBC: 3.44 10*6/uL — ABNORMAL LOW (ref 3.80–5.00)
RDW: 12.4 % (ref 11.0–14.0)
WBC: 8.1 10*3/uL (ref 4.0–11.0)

## 2021-12-27 LAB — MAGNESIUM: Magnesium: 1.8 mg/dL (ref 1.6–2.6)

## 2021-12-27 LAB — ECG 12-LEAD
P Wave Axis: 72 deg
P-R Interval: 118 ms
Patient Age: 71 years
Q-T Interval(Corrected): 440 ms
Q-T Interval: 383 ms
QRS Axis: -46 deg
QRS Duration: 101 ms
T Axis: 38 years
Ventricular Rate: 79 //min

## 2021-12-27 LAB — PHOSPHORUS: Phosphorus: 2.4 mg/dL (ref 2.3–4.7)

## 2021-12-27 SURGERY — ROBOT ASSISTED, LAPAROSCOPIC, OPERATIVE
Anesthesia: Anesthesia General | Site: Abdomen | Wound class: Contaminated

## 2021-12-27 MED ORDER — FENTANYL CITRATE (PF) 50 MCG/ML IJ SOLN (WRAP)
25.0000 ug | INTRAMUSCULAR | Status: DC | PRN
Start: 2021-12-27 — End: 2021-12-27

## 2021-12-27 MED ORDER — ROCURONIUM BROMIDE 50 MG/5ML IV SOLN
INTRAVENOUS | Status: DC | PRN
Start: 2021-12-27 — End: 2021-12-27
  Administered 2021-12-27: 50 mg via INTRAVENOUS

## 2021-12-27 MED ORDER — STERILE WATER FOR INJECTION IJ SOLN
2.0000 g | Freq: Three times a day (TID) | INTRAMUSCULAR | Status: DC
Start: 2021-12-27 — End: 2021-12-27
  Administered 2021-12-27: 2 g via INTRAVENOUS

## 2021-12-27 MED ORDER — FENTANYL CITRATE (PF) 50 MCG/ML IJ SOLN (WRAP)
25.0000 ug | INTRAMUSCULAR | Status: DC | PRN
Start: 2021-12-27 — End: 2021-12-27
  Administered 2021-12-27 (×3): 25 ug via INTRAVENOUS

## 2021-12-27 MED ORDER — VH PHENYLEPHRINE 120 MCG/ML IV BOLUS (ANESTHESIA)
PREFILLED_SYRINGE | INTRAVENOUS | Status: AC
Start: 2021-12-27 — End: ?
  Filled 2021-12-27: qty 20

## 2021-12-27 MED ORDER — ONDANSETRON HCL 4 MG/2ML IJ SOLN
INTRAMUSCULAR | Status: DC | PRN
Start: 2021-12-27 — End: 2021-12-27
  Administered 2021-12-27: 4 mg via INTRAVENOUS

## 2021-12-27 MED ORDER — ONDANSETRON HCL 4 MG/2ML IJ SOLN
4.0000 mg | Freq: Once | INTRAMUSCULAR | Status: DC | PRN
Start: 2021-12-27 — End: 2021-12-27

## 2021-12-27 MED ORDER — VH HYDROMORPHONE HCL PF 1 MG/ML CARPUJECT
0.5000 mg | INTRAMUSCULAR | Status: DC | PRN
Start: 2021-12-27 — End: 2021-12-27

## 2021-12-27 MED ORDER — LACTATED RINGERS IV SOLN
INTRAVENOUS | Status: DC | PRN
Start: 2021-12-27 — End: 2021-12-27

## 2021-12-27 MED ORDER — METOCLOPRAMIDE HCL 5 MG/ML IJ SOLN
10.0000 mg | Freq: Once | INTRAMUSCULAR | Status: DC | PRN
Start: 2021-12-27 — End: 2021-12-27

## 2021-12-27 MED ORDER — FENTANYL CITRATE (PF) 50 MCG/ML IJ SOLN (WRAP)
INTRAMUSCULAR | Status: DC | PRN
Start: 2021-12-27 — End: 2021-12-27
  Administered 2021-12-27 (×4): 50 ug via INTRAVENOUS

## 2021-12-27 MED ORDER — SUCCINYLCHOLINE CHLORIDE 20 MG/ML IJ SOLN
INTRAMUSCULAR | Status: DC | PRN
Start: 2021-12-27 — End: 2021-12-27
  Administered 2021-12-27: 120 mg via INTRAVENOUS

## 2021-12-27 MED ORDER — BUPIVACAINE HCL (PF) 0.25 % IJ SOLN
INTRAMUSCULAR | Status: AC | PRN
Start: 2021-12-27 — End: 2021-12-27
  Administered 2021-12-27: 30 mL

## 2021-12-27 MED ORDER — PROPOFOL 200 MG/20ML IV EMUL
INTRAVENOUS | Status: DC | PRN
Start: 2021-12-27 — End: 2021-12-27
  Administered 2021-12-27: 140 mg via INTRAVENOUS
  Administered 2021-12-27: 60 mg via INTRAVENOUS

## 2021-12-27 MED ORDER — ACETAMINOPHEN 10 MG/ML IV SOLN
1000.0000 mg | Freq: Four times a day (QID) | INTRAVENOUS | Status: AC
Start: 2021-12-27 — End: 2021-12-29
  Administered 2021-12-27 – 2021-12-29 (×5): 1000 mg via INTRAVENOUS

## 2021-12-27 MED ORDER — DEXAMETHASONE SODIUM PHOSPHATE 4 MG/ML IJ SOLN
INTRAMUSCULAR | Status: DC | PRN
Start: 2021-12-27 — End: 2021-12-27
  Administered 2021-12-27: 4 mg via INTRAVENOUS

## 2021-12-27 MED ORDER — SUGAMMADEX SODIUM 200 MG/2ML IV SOLN
INTRAVENOUS | Status: AC
Start: 2021-12-27 — End: ?
  Filled 2021-12-27: qty 2

## 2021-12-27 MED ORDER — KETOROLAC TROMETHAMINE 15 MG/ML IJ SOLN
15.0000 mg | Freq: Four times a day (QID) | INTRAMUSCULAR | Status: DC
Start: 2021-12-27 — End: 2021-12-29
  Administered 2021-12-27 – 2021-12-29 (×7): 15 mg via INTRAVENOUS

## 2021-12-27 MED ORDER — VH PHENYLEPHRINE 120 MCG/ML IV BOLUS (ANESTHESIA)
PREFILLED_SYRINGE | INTRAVENOUS | Status: DC | PRN
Start: 2021-12-27 — End: 2021-12-27
  Administered 2021-12-27: 180 ug via INTRAVENOUS

## 2021-12-27 MED ORDER — SUGAMMADEX SODIUM 200 MG/2ML IV SOLN
INTRAVENOUS | Status: DC | PRN
Start: 2021-12-27 — End: 2021-12-27
  Administered 2021-12-27: 150 mg via INTRAVENOUS

## 2021-12-27 SURGICAL SUPPLY — 35 items
ADHESIVE DERMABOND HIVIC PEN (Dressings) ×2 IMPLANT
CANNISTER SUCTION 3000C (Supply) IMPLANT
CHLORAPREP W/ORANGE TINT 26ML (Supply) ×2 IMPLANT
CORD MONOPOLAR ENRG INST GREEN (Supply) ×2 IMPLANT
COVER MAYO STAND (Supply) ×2 IMPLANT
DRAPE DA VINCI ARM (Supply) ×8 IMPLANT
DRAPE DA VINCI COLUMN (Supply) ×2 IMPLANT
DRAPE TOP 102IN X 53IN (Supply) ×2 IMPLANT
FORCEPS CADIERE  18 USES (Supply) ×2 IMPLANT
GLOVE BIOGEL UND PI IND SZ 6.5 (Supply) ×2 IMPLANT
GLOVE BIOGEL UND PI IND SZ 8 (Supply) ×2 IMPLANT
GLOVE BIOGEL UT M PI SUR SZ7.5 (Supply) ×2 IMPLANT
GLOVE BIOGEL UT M PI SURG SZ 6 (Supply) ×2 IMPLANT
GOWN AURORA NONREINF STER XLG (Supply) ×4 IMPLANT
GRASPER FENE TIP-UP 10 USES (Supply) ×2 IMPLANT
HOOK CAUTERY PERMANENT 10 USES (Supply) ×2 IMPLANT
OBTURATOR BLADELESS 8MM (Supply) ×2 IMPLANT
PACK ABDOMINAL LAP CDS760057 (Supply) ×2 IMPLANT
SCS MONOPOLAR CVD 8MM 10 USES (Supply) ×2 IMPLANT
SEAL UNIVERSAL 5-12MM (Supply) ×8 IMPLANT
SOL SALINE .9% 1000ML  LF (Solutions) IMPLANT
SOL SALINE IRRIG 500ML (Supply) ×2 IMPLANT
SOL WATER STERILE IRRG 1000ML (Supply) ×2 IMPLANT
STAPLER ENDWRST30 CV  50 FIRES (Supply) IMPLANT
STRATAFIX SPIRAL PDS+ (Supply) ×2 IMPLANT
STRYKEFLOW2 WITH DISP TIP (Supply) ×2 IMPLANT
SURGINEEDLE 120MM (Supply) ×2 IMPLANT
SUT VICRYL 4-0 J496H (Supply) ×4 IMPLANT
SYRINGE SLIP TIP 5CC (Supply) ×2 IMPLANT
TIP COVER ACSRY MONO CVD SCS (Supply) ×2 IMPLANT
TRAP FLUID FOR SMOKE EVAC (Supply) ×2 IMPLANT
TROCAR 12MM GELPORT BALL. HAS (Supply) IMPLANT
TROCAR 5MM NB FIOS Z-THREAD (Supply) IMPLANT
TUBING IRRIG SINGLE TUBE SET (Supply) IMPLANT
TUBING SMOVE EVAC HFLO (Supply) ×2 IMPLANT

## 2021-12-27 NOTE — OR Nursing (Signed)
Contacted Ty Strauch, LPN patient liaison regarding start of case. Family updated, all questions answered.

## 2021-12-27 NOTE — Anesthesia Postprocedure Evaluation (Signed)
Anesthesia Post Evaluation    Patient: Nicole Sutton    Procedure(s):  ROBOT ASSISTED DIAGNOSTIC LAPAROSCOPY, LYSIS OF ADHESIONS    Anesthesia type: general    Last Vitals:   Vitals Value Taken Time   BP 127/72 12/27/21 1300   Temp 36.5 C (97.7 F) 12/27/21 1300   Pulse 98 12/27/21 1300   Resp 18 12/27/21 1300   SpO2 100 % 12/27/21 1300                 Anesthesia Post Evaluation:     Patient Evaluated: PACU  Patient Participation: complete - patient participated  Level of Consciousness: awake and alert    Pain Management: adequate  Multimodal analgesia pain management approach    Airway Patency: patent    Anesthetic complications: No      PONV Status: none    Cardiovascular status: acceptable  Respiratory status: acceptable  Hydration status: acceptable        Signed by: Lilly Cove, MD, 12/27/2021 1:37 PM

## 2021-12-27 NOTE — Progress Notes (Addendum)
Quick Doc  [facility] - AM ADMITTING   Patient Name: Nicole Sutton, Nicole Sutton   Attending Physician: Ignacia Bayley, MD   Today's date:   12/27/2021 LOS: 4 days   Expected Discharge Date      Quick  Assessment:                                                              ReAdmit Risk Score: 13.22    CM Comments: 12/27/21 SW RJ: Adm with SBO vs constipation. OR today for diagnostic lap.  Pt independent PTA. Lives alone, children provide support when needed. Therapy recommending home with supervision. SW will follow clinical course for [place] planning needs.      Physical Discharge Disposition: Home                                   Mode of Transportation: Car                                Provider Notifications:             Harolyn Rutherford, MSW  Social Worker  [facility]

## 2021-12-27 NOTE — Anesthesia Preprocedure Evaluation (Signed)
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    ECG reviewed  pertinent labs reviewed  imaging results reviewed           Signed by: Janett Labella, MD 12/27/21

## 2021-12-27 NOTE — Plan of Care (Signed)
Problem: Altered GI Function  Goal: Fluid and electrolyte balance are achieved/maintained  Outcome: Progressing  Flowsheets (Taken 12/27/2021 0208)  Fluid and electrolyte balance are achieved/maintained:   Monitor intake and output every shift   Monitor/assess lab values and report abnormal values   Provide adequate hydration   Assess for confusion/personality changes   Assess and reassess fluid and electrolyte status   Observe for cardiac arrhythmias   Monitor for muscle weakness  Goal: Elimination patterns are normal or improving  Outcome: Progressing  Flowsheets (Taken 12/27/2021 0208)  Elimination patterns are normal or improving:   Report abnormal assessment to physician   Anticipate/assist with toileting needs   Assess for normal bowel sounds   Monitor for abdominal distension   Monitor for abdominal discomfort   Assess for signs and symptoms of bleeding.  Report signs of bleeding to physician   Administer treatments as ordered   Assess for flatus   Encourage /perform oral hygiene as appropriate   Administer medications to improve bowel evacuation as prescribed  Goal: Mobility/Activity is maintained at optimal level for patient  Outcome: Progressing  Flowsheets (Taken 12/27/2021 0208)  Mobility/activity is maintained at optimal level for patient:   Increase mobility as tolerated/progressive mobility   Encourage independent activity per ability   Maintain proper body alignment   Perform active/passive ROM   Plan activities to conserve energy, plan rest periods   Reposition patient every 2 hours and as needed unless able to reposition self   Assess for changes in respiratory status, level of consciousness and/or development of fatigue     Problem: Moderate/High Fall Risk Score >5  Goal: Patient will remain free of falls  Outcome: Progressing  Flowsheets (Taken 12/26/2021 2200)  VH Moderate Risk (6-13):   INITIATE YELLOW "FALL RISK" SIGNAGE   ALL REQUIRED LOW INTERVENTIONS   YELLOW NON-SKID SLIPPERS   YELLOW "FALL  RISK" ARM BAND   PLACE FALL RISK LEVEL ON WHITE BOARD FOR COMMUNICATION PURPOSES IN PATIENT'S ROOM   Use assistive devices

## 2021-12-27 NOTE — Transfer of Care (Signed)
Anesthesia Transfer of Care Note    Patient: Nicole Sutton  After procedure, OP suction, positive pressure extubation.  Patient breathing without difficulty. Transport to PACU with oxygen via nasal cannula.  Last vitals:   Vitals:    12/27/21 1204   BP: 133/61   Pulse: (!) 102   Resp: 20   Temp: 37.3 C (99.1 F)   SpO2: 100%       Oxygen: Nasal Cannula     Mental Status:sedated vs. awake    Airway: Natural    Cardiovascular Status:  stable        Report to PACU RN.

## 2021-12-27 NOTE — Nursing Progress Note (Signed)
NURSE NOTE SUMMARY  [facility] - 4TH SURGICAL   Patient Name: Nicole Sutton, Nicole Sutton   Attending Physician: Ignacia Bayley, MD   Today's date:   12/27/2021 LOS: 4 days   Shift Summary:                                                              8:17 PM: Pt is alert and oriented x 4. Reports mild pain when at rest that increases w/ movement. Denies n/v at this time but has had intermittent nausea during previous shift. NGT @ 60cm connected to LCWS per order. IVF running per [facility]. Continues to deny flatus or bowel movements. No further needs at this time. Call bell within reach.     10:00 PM: Pt clamped for PO melatonin administration via NGT. Unable to tolerate d/t nausea and pt's reports of "burning" in her stomach. Returned to suction per pt request.      Provider Notifications:        Rapid Response Notifications:  Mobility:      PMP Activity: Step 7 - Walks out of Room (12/26/2021 10:21 AM)     Weight tracking:  Family Dynamic:   No data found.          Last Bowel Movement   Last BM Date: 12/23/21

## 2021-12-27 NOTE — Op Note (Signed)
FULL OPERATIVE NOTE    Date Time: 12/27/21 12:22 PM  Patient Name: Nicole BaldingSTEPTOE,Nicole Sutton  Attending Physician: Nicole CollegeMayuiers, Aleshia Cartelli J, MD      Date of Operation:   12/27/2021    Providers Performing:   Surgeon(s):  Sudiksha Victor, Kermit BaloMatthew J, MD    Anesthesiologist: Caesar ChestnutHarris, James A, DO; Janett LabellaKrupica, Thomas P, MD  CRNA: Margit BandaLinton, Gardner S, CRNA    Circulator: Raymon MuttonKaylor, Jennifer, RN  Relief Circulator: Octavio GravesMason, Alexa L, RN  Scrub Person: Valinda PartyPhillips, Marquisha  First Assistant: Kaylyn LimGarrett, Anita L    Anesthesia/Sedation:   general    Operative Procedure:   Procedure(s):  ROBOT ASSISTED DIAGNOSTIC LAPAROSCOPY, LYSIS OF ADHESIONS - Wound Class: Contaminated     Preoperative Diagnosis:   Pre-Op Diagnosis Codes:     * SBO (small bowel obstruction) [K56.609]    Postoperative Diagnosis:   Post-Op Diagnosis Codes:     * SBO (small bowel obstruction) [K56.609]    Findings:   Adhesive small bowel obstruction, adhesions from small bowel to abdominal wall and to omental band.     Indications:   [name] presented with small bowel obstruction. Over several days she has failed conservative management. Operative intervention is discussed and agreed upon.     Operative Notes:   After a discussion between myself and Nicole Sutton including all of the risks of the procedure including but not limited to, infection, bleeding, damage to surrounding structures including abdominal viscera, which could cause abscess, leak, enterotomy, recurrence, hernia, need for additional surgery, heart attack, stroke, and death, consent was signed and secured in the chart. The patient was taken to the operating room and placed on the operative table in the supine position. All pressure points were carefully padded. sequential compression devices were placed on bilateral lower extremities. A bovie grounding cord was placed. A time out was then performed including the patient's name, date of birth, medical record number, operative procedure, and safety steps of the operation.  Anesthesia was then induced without incidence. The patient was then prepped and draped in sterile fashion.     A stab incision was made in the left upper quadrant. A Veress needle was inserted into the abdomen. Pneumoperitoneum was established. A 5mm incision was made at the right midclavicular line at the level of the umbilicus. A 5mm trocar was placed using the optiview technique. There was no injury at the entry site or the Veress Site. Immediately a single long dense adhesion was seen between small bowel and the abdominal wall. A 8mm port was placed in the RUQ, RLQ, and exchanged for the 5mm trocar under direct visualization. The DaVinci Xi robotic system was then docked.    The adhesion to the abdominal wall was taken down with cautery scissors, taking care to stay away from bowel and sacrifice peritoneum. The small bowel was then run until a loop was noted to be wrapped around what appeared to be the falciform ligament, but was actually the remainder of the omentum, adherent to the falciform and a loop of bowel that was deep in the abdomen. Gentle traction released this adhesion. The bowel was then run from LOT to ICV. The loop containing peritoneum was encountered first, and inspected for hemostasis and injury. A second piece of small bowel with a bleeding piece of fat attatched was noted at the transition from dilated to normal bowel. Cautery was necessary to achieve hemostasis of this area. The bowel was inspected twice more for evidence of injury, including at the conclusion of the case  to look for delayed injury. The bowel was then run again from ICV to LOT. No other injuries or abnormalities were noted.  Peritoneal ascites was suctioned from the abdomen. The trocar sites were all inspected. Pneumoperitoneum was released. All incisions were closed with 4-0 monocryl and covered with dermabond.     The patient was awoken in the OR, extubated in the OR, tolerated the procedure well and was transported to the  floor in stable condition.     All sponge instrument and needle counts were correct at the beginning and end of the case.       Estimated Blood Loss:    20 mL    Implants:   * No implants in log *    Drains:   Drains: no    Specimens:   * No specimens in log *      Complications:   * No complications entered in OR log *      Signed by: Nicole College, MD, MD

## 2021-12-27 NOTE — Plan of Care (Addendum)
NURSE NOTE SUMMARY  [facility] - AM ADMITTING   Patient Name: Nicole Sutton, Nicole Sutton   Attending Physician: Ignacia Bayley, MD   Today's date:   12/27/2021 LOS: 4 days   Shift Summary:                                                              A&ox4 VSS co severe pain to abdomen medicated with prns NGT intact to LCWS   pt transported to XR around 0815 and preop called for her pt transported straight to preop   1330 pt returned to room A&Ox4 voided NGT back to suction lap sites intact   Provider Notifications:        Rapid Response Notifications:  Mobility:      PMP Activity: Step 6 - Walks in Room; Step 5 - Chair (12/27/2021  9:00 AM)     Weight tracking:  Family Dynamic:   No data found.          Last Bowel Movement   Last BM Date: 12/23/21       Problem: Altered GI Function  Goal: Fluid and electrolyte balance are achieved/maintained  Outcome: Progressing  Goal: Elimination patterns are normal or improving  Outcome: Progressing  Goal: Mobility/Activity is maintained at optimal level for patient  Outcome: Progressing     Problem: Moderate/High Fall Risk Score >5  Description: Fall Risk Score > 5  Goal: Patient will remain free of falls  Outcome: Progressing

## 2021-12-27 NOTE — Progress Notes (Signed)
DAILY PROGRESS NOTE - Trauma Acute Care Surgery (TACS)   Name:  Nicole Sutton, Nicole Sutton     DOB:  02/02/1951   MR#:  [id number]               ROOM: 451/451-A    DATE:  12/27/21      Principal Diagnosis:  SBO (small bowel obstruction)    Refer to below for diagnoses being addressed for this encounter       ASSESSMENT & PLAN:                                                              Hospital Day: 5    s/p SBO possibly due to constipation, questionable acute appendicitis   CONDITION:  stable    Patient Active Hospital Problem List:         SBO (small bowel obstruction) (12/23/2021)  possibly likely due to constipation or separate SBO        Assessment: pt does feel better today after passing flatus   - still has failed to have BM after 48 hrs of contrast, I suspect at least significant partial obstruction. Discussed with patient, she would like to proceed with RA diagnostic laparoscopy today     Acute appendicitis (12/23/2021)           Assessment: stable, CT looks less likely         Plan: continue IV antibiotics if there is some inflammation as treatment, follow labs     Has a h/o abdominal sarcoma and associated surgeries.          Rea College, MD    Juliann Pares [phone]  [facility] Trauma Acute Care Surgery Program  Phone [phone] or Pager [phone]    Additional Info:  Care plan and expectations reviewed with Patient  Questions were answered.  Discussed with bedside RN      Subjective/Chief Complaint & ROS:   Pain is improved, passing flatus but no BM.     24-hour interval history:  failed contrast study      Meds:   acetaminophen, 1,000 mg, Intravenous, Q6H  enoxaparin, 40 mg, Subcutaneous, Q24H  famotidine, 20 mg, Intravenous, Q12H SCH  piperacillin-tazobactam, 3.375 g, Intravenous, Q6H  sodium chloride (PF), 3 mL, Intravenous, Q12H SCH        Infusions:   dextrose 5 % and 0.45 % NaCl with KCl 20 mEq 100 mL/hr at 12/26/21 1548        DVT Prophylaxis:  enoxaparin 40 mg SQ Daily and SCDs  Medication VTE Prophylaxis Orders: enoxaparin  (LOVENOX) syringe 40 mg  Mechanical VTE Prophylaxis Orders: Mechanical VTE: Pneumatic Compression; Knee high    Foley:  No          GI Prophylaxis:  Yes      BM last 24 Hours:   no, 7/21    Bowel Regimen:  No   Diet:  Diet NPO effective now - Surgery/Procedure; Sips with Meds allowed     I & O's:  Data reviewed     Pain: reasonably controlled  EXAM:   Vitals:  Blood pressure 139/71, pulse 87, temperature 98.2 F (36.8 C), temperature source Temporal, resp. rate 16, height 1.702 m (5\' 7" ), weight 77 kg (169 lb 12.1 oz), SpO2 98 %.   General:  in no apparent distress, thin  Pulmonary:   lungs clear to ausculation, equal breath sounds, unlabored respirations, no rales/rhonchi/wheezes, no accessory muscle use  Cardiac:   regular rate and rhythm without murmurs/rubs/gallops  Abdomen:   hypoactive bowel sounds, soft, mild right sided tenderness without rebound and without guarding, mildly distended  Neuro:   alert, no gross focal neurologic deficits, moves all extremities well  Psych:   normal affect, insight good  Extremities:   no edema and SMAE  Skin:   no jaundice, no rashes, normothermic    Lab Data Reviewed:  Yes -   Cultures:  None.      CHEM:     Recent Labs   Lab 12/27/21  0413 12/26/21  0401 12/25/21  1109 12/25/21  0545 12/24/21  0425 12/23/21  0645   Glucose 115* 119* 84  --  93 145*   Sodium 134* 136 136  --  141 141   Potassium 3.6 4.2 3.9  --  3.9 4.2   Chloride 101 102 101  --  108 107   CO2 24.6 21 25.3  --  25 23   BUN 17 27* 26*  --  24* 29*   Creatinine 0.83 1.01 1.00  --  0.92 0.99   Calcium 8.5 9.2 9.8  --  9.0 9.3   Magnesium 1.8 1.9  --  1.9 2.0  --    Phosphorus 2.4 2.1*  --  2.4 2.6  --        CBC:       Recent Labs   Lab 12/27/21  0413 12/26/21  0401 12/25/21  1109 12/24/21  0425 12/23/21  0356   WBC 8.1 7.5 9.2 9.7 13.0*   Hemoglobin 11.1* 10.7* 11.0* 10.1* 11.8*   Hematocrit 35.3* 34.4* 34.9* 33.6* 39.3   PLT CT 205 202 217 208 240   MCV 103* 101* 103* 102* 102*       BANDS:      POCT:       LFTs:        COAG:      Lactate:    Recent Labs   Lab 12/23/21  0356   Lactic Acid 1.2         Radiology:   No results found.

## 2021-12-28 LAB — CBC AND DIFFERENTIAL
Basophils %: 0.5 % (ref 0.0–3.0)
Basophils Absolute: 0 10*3/uL (ref 0.0–0.3)
Eosinophils %: 3 % (ref 0.0–7.0)
Eosinophils Absolute: 0.2 10*3/uL (ref 0.0–0.8)
Hematocrit: 29.3 % — ABNORMAL LOW (ref 36.0–48.0)
Hemoglobin: 9.5 gm/dL — ABNORMAL LOW (ref 12.0–16.0)
Lymphocytes Absolute: 1.2 10*3/uL (ref 0.6–5.1)
Lymphocytes: 16.2 % (ref 15.0–46.0)
MCH: 33 pg (ref 28–35)
MCHC: 32 gm/dL (ref 32–36)
MCV: 102 fL — ABNORMAL HIGH (ref 80–100)
MPV: 9.9 fL (ref 6.0–10.0)
Monocytes Absolute: 0.8 10*3/uL (ref 0.1–1.7)
Monocytes: 10.5 % (ref 3.0–15.0)
Neutrophils %: 69.8 % (ref 42.0–78.0)
Neutrophils Absolute: 5.3 10*3/uL (ref 1.7–8.6)
PLT CT: 180 10*3/uL (ref 130–440)
RBC: 2.86 10*6/uL — ABNORMAL LOW (ref 3.80–5.00)
RDW: 12.4 % (ref 11.0–14.0)
WBC: 7.6 10*3/uL (ref 4.0–11.0)

## 2021-12-28 LAB — BASIC METABOLIC PANEL
Anion Gap: 9 mMol/L (ref 7.0–18.0)
BUN / Creatinine Ratio: 18.1 Ratio (ref 10.0–30.0)
BUN: 15 mg/dL (ref 7–22)
CO2: 21.6 mMol/L (ref 20.0–30.0)
Calcium: 7.8 mg/dL — ABNORMAL LOW (ref 8.5–10.5)
Chloride: 107 mMol/L (ref 98–110)
Creatinine: 0.83 mg/dL (ref 0.60–1.20)
EGFR: 75 mL/min/{1.73_m2} (ref 60–150)
Glucose: 103 mg/dL — ABNORMAL HIGH (ref 71–99)
Osmolality Calculated: 269 mOsm/kg — ABNORMAL LOW (ref 275–300)
Potassium: 3.6 mMol/L (ref 3.5–5.3)
Sodium: 134 mMol/L — ABNORMAL LOW (ref 136–147)

## 2021-12-28 LAB — PHOSPHORUS: Phosphorus: 2.1 mg/dL — ABNORMAL LOW (ref 2.3–4.7)

## 2021-12-28 LAB — MAGNESIUM: Magnesium: 1.8 mg/dL (ref 1.6–2.6)

## 2021-12-28 NOTE — Plan of Care (Signed)
Problem: Altered GI Function  Goal: Fluid and electrolyte balance are achieved/maintained  Outcome: Progressing  Flowsheets (Taken 12/27/2021 0208)  Fluid and electrolyte balance are achieved/maintained:   Monitor intake and output every shift   Monitor/assess lab values and report abnormal values   Provide adequate hydration   Assess for confusion/personality changes   Assess and reassess fluid and electrolyte status   Observe for cardiac arrhythmias   Monitor for muscle weakness  Goal: Elimination patterns are normal or improving  Outcome: Progressing  Flowsheets (Taken 12/27/2021 0208)  Elimination patterns are normal or improving:   Report abnormal assessment to physician   Anticipate/assist with toileting needs   Assess for normal bowel sounds   Monitor for abdominal distension   Monitor for abdominal discomfort   Assess for signs and symptoms of bleeding.  Report signs of bleeding to physician   Administer treatments as ordered   Assess for flatus   Encourage /perform oral hygiene as appropriate   Administer medications to improve bowel evacuation as prescribed  Goal: Mobility/Activity is maintained at optimal level for patient  Outcome: Progressing  Flowsheets (Taken 12/27/2021 0208)  Mobility/activity is maintained at optimal level for patient:   Increase mobility as tolerated/progressive mobility   Encourage independent activity per ability   Maintain proper body alignment   Perform active/passive ROM   Plan activities to conserve energy, plan rest periods   Reposition patient every 2 hours and as needed unless able to reposition self   Assess for changes in respiratory status, level of consciousness and/or development of fatigue     Problem: Moderate/High Fall Risk Score >5  Goal: Patient will remain free of falls  Outcome: Progressing  Flowsheets (Taken 12/27/2021 2130)  VH Moderate Risk (6-13):   ALL REQUIRED LOW INTERVENTIONS   INITIATE YELLOW "FALL RISK" SIGNAGE   YELLOW NON-SKID SLIPPERS   YELLOW "FALL  RISK" ARM BAND   PLACE FALL RISK LEVEL ON WHITE BOARD FOR COMMUNICATION PURPOSES IN PATIENT'S ROOM   Use assistive devices
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
## 2021-12-28 NOTE — Progress Notes (Signed)
DAILY PROGRESS NOTE - Trauma Acute Care Surgery (TACS)   Name:  Nicole Sutton, Nicole Sutton     DOB:  [birth year]   MR#:  [id number]               ROOM: 451/451-A    DATE:  12/28/21      Principal Diagnosis:  SBO (small bowel obstruction)    Refer to below for diagnoses being addressed for this encounter       ASSESSMENT & PLAN:                                                              Hospital Day: 6    S/p Robotic LOA for SBO 12/27/2021  CONDITION:  stable    Patient Active Hospital Problem List:         SBO (small bowel obstruction) (12/23/2021)    s/p robotic LOA   - feels greatly improved. Will clamp NGT and trial of diet     Acute appendicitis (12/23/2021)           Assessment: stable, CT looks less likely         Plan: continue IV antibiotics if there is some inflammation as treatment, follow labs     Has a h/o abdominal sarcoma and associated surgeries.          Rea College, MD    Juliann Pares [phone]  [facility] Trauma Acute Care Surgery Program  Phone [phone] or Pager [phone]    Additional Info:  Care plan and expectations reviewed with Patient  Questions were answered.  Discussed with bedside RN      Subjective/Chief Complaint & ROS:   No pain, passing flatus and BM        Meds:   acetaminophen, 1,000 mg, Intravenous, Q6H  enoxaparin, 40 mg, Subcutaneous, Q24H  famotidine, 20 mg, Intravenous, Q12H SCH  ketorolac, 15 mg, Intravenous, 4 times per day  piperacillin-tazobactam, 3.375 g, Intravenous, Q6H  sodium chloride (PF), 3 mL, Intravenous, Q12H SCH        Infusions:   dextrose 5 % and 0.45 % NaCl with KCl 20 mEq 100 mL/hr at 12/28/21 1359        DVT Prophylaxis:  enoxaparin 40 mg SQ Daily and SCDs  Medication VTE Prophylaxis Orders: enoxaparin (LOVENOX) syringe 40 mg  Mechanical VTE Prophylaxis Orders: Mechanical VTE: Pneumatic Compression; Knee high    Foley:  No          GI Prophylaxis:  Yes      BM last 24 Hours:   no, 7/21    Bowel Regimen:  No   Diet:  Diet clear liquid     I & O's:  Data reviewed     Pain: reasonably  controlled  EXAM:   Vitals:  Blood pressure 123/65, pulse 85, temperature 98.1 F (36.7 C), temperature source Temporal, resp. rate 13, height 1.702 m (5\' 7" ), weight 77 kg (169 lb 12.1 oz), SpO2 99 %.   General:   in no apparent distress, thin  Pulmonary:   lungs clear to ausculation, equal breath sounds, unlabored respirations, no rales/rhonchi/wheezes, no accessory muscle use  Cardiac:   regular rate and rhythm without murmurs/rubs/gallops  Abdomen:   hypoactive bowel sounds, soft, mild right sided tenderness without rebound and without guarding, mildly  distended  Neuro:   alert, no gross focal neurologic deficits, moves all extremities well  Psych:   normal affect, insight good  Extremities:   no edema and SMAE  Skin:   no jaundice, no rashes, normothermic    Lab Data Reviewed:  Yes -   Cultures:  None.      CHEM:     Recent Labs   Lab 12/28/21  0420 12/27/21  0413 12/26/21  0401 12/25/21  1109 12/25/21  0545 12/24/21  0425   Glucose 103* 115* 119* 84  --  93   Sodium 134* 134* 136 136  --  141   Potassium 3.6 3.6 4.2 3.9  --  3.9   Chloride 107 101 102 101  --  108   CO2 21.6 24.6 21 25.3  --  25   BUN 15 17 27* 26*  --  24*   Creatinine 0.83 0.83 1.01 1.00  --  0.92   Calcium 7.8* 8.5 9.2 9.8  --  9.0   Magnesium 1.8 1.8 1.9  --  1.9 2.0   Phosphorus 2.1* 2.4 2.1*  --  2.4 2.6       CBC:       Recent Labs   Lab 12/28/21  0420 12/27/21  0413 12/26/21  0401 12/25/21  1109 12/24/21  0425   WBC 7.6 8.1 7.5 9.2 9.7   Hemoglobin 9.5* 11.1* 10.7* 11.0* 10.1*   Hematocrit 29.3* 35.3* 34.4* 34.9* 33.6*   PLT CT 180 205 202 217 208   MCV 102* 103* 101* 103* 102*       BANDS:      POCT:      LFTs:        COAG:      Lactate:    Recent Labs   Lab 12/23/21  0356   Lactic Acid 1.2         Radiology:   No results found.

## 2021-12-28 NOTE — Progress Notes (Addendum)
Quick Doc  [facility] - 4TH SURGICAL   Patient Name: MIYOSHI, LIGAS   Attending Physician: Rea College, MD   Today's date:   12/28/2021 LOS: 5 days   Expected Discharge Date      Quick  Assessment:                                                              ReAdmit Risk Score: 13.51    CM Comments: 12/28/21 SW RJ: Adm with SBO vs constipation. OR yesterday for RAL LOA. AROBF. NPO. Pt independent PTA. Lives alone, children provide support when needed. Therapy recommending home with supervision. No Willard needs identified at this time. Please consult CM if needs arise. SW will follow.          Physical Discharge Disposition: Home                                   Mode of Transportation: Car                                Provider Notifications:             Harolyn Rutherford, MSW  Social Worker  [facility]

## 2021-12-28 NOTE — PT Progress Note (Signed)
[facility]: [facility]  Patient: Nicole Sutton     CSN: [id number]    Bed: 451/451-A  Physical Therapy PROGRESS note   Visit#: 2   Treatment Frequency: therapy discontinued  Last seen by a physical therapist vs. Physical therapist assistant: 12/28/2021    DISCHARGE RECOMMENDATIONS   Discharge Recommendations:   Home with supervision         DME recommended for Discharge:   Has needed equipment    PMP (Progressive Mobility Program) Recommendations:   Recommend patient  ambulate 2-3 times/day with Gilmer Mor and physical assist and/or supervision of 1 staff, transfer to chair/BSC 2-3 times/day with Gilmer Mor and physical assist and/or supervision of 0 staff as tolerated.     Precautions and Contraindications:   Mobility protocol     PT Assessment and Plan of Care (Treatment frequency noted above):   HPI (per physician charting) and Pertinent Medical Details:    Admitted 12/23/2021 with/for  small bowel obstruction and questionable acute appendicitis     Goals:    Long Term Goals:  Patient will achieve the following goals by 2 visits  Patient will be able to perform bed mobility with supervision assistance in order to prepare for out of bed activities. MET  Patient will be able to perform 150 feet ambulation with supervision assistance using the least restrictive device in order to prepare for household ambulation. MET    PT Assessment:  Patient's progress towards established goals: Patient has met all goals for mobility. Pt is at baseline with no further acute skilled PT needs. Recommend home with supervision at discharge.      Treatment plan: No further skilled acute care interventions needed at this time.    Subjective:   " I just walked 3 laps around "  Patient/family/caregiver consent to therapy session is noted by the participation in the therapy session.    Pain:  At Rest: 0 /10  With Activity: 0/10  Location: N/A  Interventions: None required    OBJECTIVE:   Observation of  Patient/Vital Signs:   Patient's medical condition is appropriate for Physical therapy intervention at this time.    Vital Signs:  Stable with no signs/symptoms of distress    Edema: present in LE's    Oriented to: Oriented x4  Command following: Follows ALL commands and directions without difficulty    Musculoskeletal and Balance Details:   Balance:  Static Standing:  Good  Dynamic Standing:  Good          Bed Mobility:   Not tested due to patient already OOB.    Transfers:  Sit to Stand:  Independent with No assistive device.         Stand to Sit:  Independent.           Locomotion:  LEVEL AMBULATION:  Distance: 150'   Assistance level:  Supervision  Device:  Single point cane  Pattern:  Reciprocal, Step through, standing rest breaks as needed, occasional decreased clearance of R foot      Other Treatment Interventions this session:   Therapeutic activity  Patient/family/caregiver education     Education Provided:   TOPICS: role of physical therapy, plan of care, goals of therapy and safety with mobility and ADLs, benefits of activity, home safety, use of adaptive equipment, activity with nursing    Learner educated: Patient  Method: Explanation  Response to education: Verbalized understanding    Patient Position at End of Treatment:   Sitting, in a reclined position in  chair, in the room, Needs in reach, No distress, and SCD's/foot pumps applied    Team Communication:   Spoke to : RN/LPN - Natalia LeatherwoodKatherine  Regarding: Pre-session re: patient status, Patient position at end of session, Discharge needs, Patient participation with Therapy, Vital signs, Progressive Mobility Program recommendations  PT/PTA communication: via written note and verbal communication as needed.    Time of treatment:   Time Calculation  PT Received On: 12/28/21  Start Time: 1048  Stop Time: 1103  Time Calculation (min): 15 min    Jaci LazierKristina A Vernie Piet, PT, DPT

## 2021-12-28 NOTE — Nursing Progress Note (Signed)
NURSE NOTE SUMMARY  [facility] - 4TH SURGICAL   Patient Name: Nicole Sutton, Nicole Sutton   Attending Physician: Rea College, MD   Today's date:   12/28/2021 LOS: 5 days   Shift Summary:                                                              8:00 PM: Alert and oriented x 4. Denies pain or n/v and reports feeling "better than [she has] in months". Abdominal lap sites are approximated, closed, and dry. NGT remains at depth of 60cm and connected to LCWS per order. Pt verbalizes hope to have NGT removed tomorrow. IVF running per [facility]. Some bladder urgency at baseline per pt. Pt reports flatus.      Provider Notifications:        Rapid Response Notifications:  Mobility:      PMP Activity: Step 6 - Walks in Room; Step 5 - Chair (12/27/2021  9:00 AM)     Weight tracking:  Family Dynamic:   No data found.          Last Bowel Movement   Last BM Date: 12/23/21

## 2021-12-28 NOTE — Progress Notes (Signed)
MOBILITY NOTE:     Precautions: Weight Bearing: Full weight bearing     Activity:  Ambulated in the hall x820 feet     Level of Assistance: A little     Device(s) used: Cane     Positioning frequency: Turns self as needed    Last Vitals: BP 124/61   Pulse 81   Temp 98.2 F (36.8 C) (Temporal)   Resp 18   Ht 1.702 m (5\' 7" )   Wt 77 kg (169 lb 12.1 oz)   SpO2 100%   BMI 26.59 kg/m       Pt in bed, ready to go for a walk. C/o slight right knee pain, no nausea or SOB. Offered cues to take it slow and steady. Pt back in room, sitting up in recliner. Bed side table and call bell within reach. RN notified

## 2021-12-28 NOTE — UM Notes (Signed)
[facility] Utilization Management Review       Facility :  [facility]  ROOM: 451/451-A  AUTH#: [id number]    NAME: Nicole Sutton  Date of Birth: Oct 31, 1950  MRN#: [id number]  CSN#: [id number]    ADMIT DATE AND TIME: 12/23/2021  2:13 AM    CONTINUED STAY REVIEW 12/28/2021    Pertinent Updates:   pt states she is passing gas to be specific pt stated she passsed gas 5 times.   She reports being hungry and would like NGT removed.   OOB to chair denies pain or nausea  Ambulating frequently in hallway     1030 NGT clamped for clamp trial      Vitals:  BP 124/61   Pulse 81   Temp 98.2 F (36.8 C) (Temporal)   Resp 18   Ht 1.702 m (5\' 7" )   Wt 77 kg (169 lb 12.1 oz)   SpO2 100%   BMI 26.59 kg/m        Abnl/Pertinent Labs/Radiology/Diagnostic Studies:    12/28/21 04:20   Hemoglobin 9.5 (L)   Hematocrit 29.3 (L)   RBC 2.86 (L)   MCV 102 (H)   Glucose 103 (H)   Sodium 134 (L)   Calcium 7.8 (L)   Osmolality Calculated 269 (L)   Phosphorus 2.1 (L)       Pertinent Physical Exam Findings:        Pertinent Medications:   Tylenol 1000 mg iv q 6 hours  Lovenox 40 mg sc q 24 hours  Toradol 15 mg iv q 6 hours  Zosyn 3.375 g iv q 6 hours  Lr @ 125 ml/hr iv continuous  Dilaudid 0.25 - 0.5 mg iv q 2 hours prn     Orders:   NPO  Ambulate in hall tid  I/O q 8 hours  Trauma/acute care surgery patient  Vital signs q 4 hours  Ng/og tube maintenance  Pneumatic compression knee highs        PT/OT/SLP/CM Assessments or Notes:   ReAdmit Risk Score: 13.51     CM Comments: 12/27/21 SW RJ: Adm with SBO vs constipation. OR today for diagnostic lap.  Pt independent PTA. Lives alone, children provide support when needed. PT currently recommending home with supervision and OT recommending HH. Cannot have home OT as a stand alone discipline, will need either outpatient OT or both PT/OT at home. SW will follow clinical course for  planning needs.       Physical Discharge Disposition: Home  Mode of Transportation:  Car                12/29/21, RN, BSN    UTILIZATION REVIEW SPECIALIST   Direct ph.: [phone]  UR Dept. #: [id number]  UR fax #:  [phone]    E mail address: [phone]

## 2021-12-28 NOTE — Plan of Care (Addendum)
NURSE NOTE SUMMARY  [facility] - 4TH SURGICAL   Patient Name: Nicole Sutton, MITCHNER   Attending Physician: Rea College, MD   Today's date:   12/28/2021 LOS: 5 days   Shift Summary:                                                              A&Ox4 VSS pt states she is passing gas to be specific pt stated she passsed gas 5 times. She reports being hungry and would like NGT removed. OOB to chair denies pain or nausea  Ambulating frequently in hallway pt having episodes of urgency and incontinence    1030 NGT clamped for clamp trial  1445 NGT removed no outuput from NGT during clamp trial    Pt denied pain all throughout shift had two bowel movements tolerated clear liquid diet    Provider Notifications:        Rapid Response Notifications:  Mobility:      PMP Activity: Step 6 - Walks in Room (12/28/2021  8:00 AM)     Weight tracking:  Family Dynamic:   No data found.          Last Bowel Movement   Last BM Date: 12/23/21       Problem: Altered GI Function  Goal: Fluid and electrolyte balance are achieved/maintained  Outcome: Progressing  Goal: Elimination patterns are normal or improving  Outcome: Progressing  Goal: Mobility/Activity is maintained at optimal level for patient  Outcome: Progressing     Problem: Moderate/High Fall Risk Score >5  Description: Fall Risk Score > 5  Goal: Patient will remain free of falls  Outcome: Progressing

## 2021-12-28 NOTE — Plan of Care (Addendum)
NURSE NOTE SUMMARY  [facility] - 4TH SURGICAL   Patient Name: Nicole Sutton, Nicole Sutton   Attending Physician: Rea College, MD   Today's date:   12/28/2021 LOS: 5 days   Shift Summary:                                                              2140  When this RN flush patient IV site, patient complaint pain and blood comes out to the other previous IV site. Charge RN informed and aware.      Provider Notifications:        Rapid Response Notifications:  Mobility:      PMP Activity: Step 7 - Walks out of Room (12/28/2021 10:48 AM)     Weight tracking:  Family Dynamic:   No data found.          Last Bowel Movement   Last BM Date: 12/23/21       Problem: Altered GI Function  Goal: Fluid and electrolyte balance are achieved/maintained  Description: Interventions:  1. Monitor intake and output every shift  2. Monitor/assess lab values and report abnormal values  3. Provide adequate hydration  4. Assess for confusion/personality changes  5. Monitor daily weight  6. Assess and reassess fluid and electrolyte status  7. Observe for seizure activity and initiate seizure precautions if indicated  8. Observe for cardiac arrhythmias  9. Monitor for muscle weakness  Outcome: Progressing  Flowsheets (Taken 12/28/2021 2145)  Fluid and electrolyte balance are achieved/maintained:   Monitor intake and output every shift   Monitor/assess lab values and report abnormal values   Provide adequate hydration   Assess for confusion/personality changes   Assess and reassess fluid and electrolyte status   Monitor for muscle weakness  Goal: Elimination patterns are normal or improving  Description: Interventions:  1.      Report abnormal assessment to physician  2.      Anticipate/assist with toileting needs  3.      Assess for normal bowel sounds  4.      Monitor for abdominal distension  5.      Monitor for abdominal discomfort  6.      Assess for signs and symptoms of bleeding.  Report signs of bleeding to physician    7.      Administer treatments as ordered  8.      Consult/collaborate with Clinical Nutritionist  9.      Assess for flatus  10.   Assess for and discuss C. diff screening with LIP  11.   Collaborate with LIP for containment device  12.   Reinforce education on foods that improve and complicate bowel movements and how activity and medications can affect bowel movements  13.   Administer medications to improve bowel evacuation as prescribed  14.   Encourage/perform oral hygiene as appropriate  Outcome: Progressing  Flowsheets (Taken 12/28/2021 2148)  Elimination patterns are normal or improving:   Report abnormal assessment to physician   Anticipate/assist with toileting needs   Assess for normal bowel sounds   Monitor for abdominal distension   Monitor for abdominal discomfort   Assess for flatus   Encourage /perform oral hygiene as appropriate  Goal: Mobility/Activity is maintained at optimal level for patient  Description: Interventions:  1. Increase mobility as tolerated/progressive mobility  2. Encourage independent activity per ability  3. Maintain proper body alignment  4. Perform active/passive ROM  5. Plan activities to conserve energy, plan rest periods  6. Reposition patient every 2 hours and as needed unless able to reposition self  7. Assess for changes in respiratory status, level of consciousness and/or development of fatigue  8. Consult/collaborate with Physical Therapy and/or Occupational Therapy  Outcome: Progressing  Flowsheets (Taken 12/28/2021 2148)  Mobility/activity is maintained at optimal level for patient:   Increase mobility as tolerated/progressive mobility   Encourage independent activity per ability   Maintain proper body alignment   Plan activities to conserve energy, plan rest periods   Reposition patient every 2 hours and as needed unless able to reposition self   Assess for changes in respiratory status, level of consciousness and/or development of fatigue     Problem: Moderate/High  Fall Risk Score >5  Description: Fall Risk Score > 5  Goal: Patient will remain free of falls  Outcome: Progressing  Flowsheets (Taken 12/28/2021 2145)  VH Moderate Risk (6-13):   ALL REQUIRED LOW INTERVENTIONS   INITIATE YELLOW "FALL RISK" SIGNAGE   YELLOW NON-SKID SLIPPERS   YELLOW "FALL RISK" ARM BAND   USE OF BED EXIT ALARM IF PATIENT IS CONFUSED OR IMPULSIVE. PLACE RESET BED ALARM SIGN ABOVE BED

## 2021-12-29 ENCOUNTER — Encounter: Payer: Self-pay | Admitting: Surgery

## 2021-12-29 LAB — CBC AND DIFFERENTIAL
Basophils %: 0.3 % (ref 0.0–3.0)
Basophils Absolute: 0 10*3/uL (ref 0.0–0.3)
Eosinophils %: 4.5 % (ref 0.0–7.0)
Eosinophils Absolute: 0.4 10*3/uL (ref 0.0–0.8)
Hematocrit: 31.8 % — ABNORMAL LOW (ref 36.0–48.0)
Hemoglobin: 9.9 gm/dL — ABNORMAL LOW (ref 12.0–16.0)
Lymphocytes Absolute: 1.6 10*3/uL (ref 0.6–5.1)
Lymphocytes: 18.8 % (ref 15.0–46.0)
MCH: 32 pg (ref 28–35)
MCHC: 31 gm/dL — ABNORMAL LOW (ref 32–36)
MCV: 103 fL — ABNORMAL HIGH (ref 80–100)
MPV: 10.4 fL — ABNORMAL HIGH (ref 6.0–10.0)
Monocytes Absolute: 1.1 10*3/uL (ref 0.1–1.7)
Monocytes: 12.6 % (ref 3.0–15.0)
Neutrophils %: 63.7 % (ref 42.0–78.0)
Neutrophils Absolute: 5.4 10*3/uL (ref 1.7–8.6)
PLT CT: 169 10*3/uL (ref 130–440)
RBC: 3.1 10*6/uL — ABNORMAL LOW (ref 3.80–5.00)
RDW: 12.5 % (ref 11.0–14.0)
WBC: 8.5 10*3/uL (ref 4.0–11.0)

## 2021-12-29 LAB — BASIC METABOLIC PANEL
Anion Gap: 12.5 mMol/L (ref 7.0–18.0)
BUN / Creatinine Ratio: 13.8 Ratio (ref 10.0–30.0)
BUN: 12 mg/dL (ref 7–22)
CO2: 18.7 mMol/L — ABNORMAL LOW (ref 20.0–30.0)
Calcium: 8.3 mg/dL — ABNORMAL LOW (ref 8.5–10.5)
Chloride: 107 mMol/L (ref 98–110)
Creatinine: 0.87 mg/dL (ref 0.60–1.20)
EGFR: 71 mL/min/{1.73_m2} (ref 60–150)
Glucose: 78 mg/dL (ref 71–99)
Osmolality Calculated: 267 mOsm/kg — ABNORMAL LOW (ref 275–300)
Potassium: 4.2 mMol/L (ref 3.5–5.3)
Sodium: 134 mMol/L — ABNORMAL LOW (ref 136–147)

## 2021-12-29 LAB — PHOSPHORUS: Phosphorus: 2.1 mg/dL — ABNORMAL LOW (ref 2.3–4.7)

## 2021-12-29 LAB — MAGNESIUM: Magnesium: 1.7 mg/dL (ref 1.6–2.6)

## 2021-12-29 MED ORDER — TRAMADOL HCL 50 MG PO TABS
50.0000 mg | ORAL_TABLET | Freq: Four times a day (QID) | ORAL | Status: DC | PRN
Start: 2021-12-29 — End: 2021-12-30

## 2021-12-29 MED ORDER — DOCUSATE SODIUM 100 MG PO CAPS
100.0000 mg | ORAL_CAPSULE | Freq: Every day | ORAL | Status: DC
Start: 2021-12-29 — End: 2021-12-30
  Administered 2021-12-30: 100 mg via ORAL

## 2021-12-29 MED ORDER — IBUPROFEN 600 MG PO TABS
600.0000 mg | ORAL_TABLET | Freq: Four times a day (QID) | ORAL | Status: DC | PRN
Start: 2021-12-29 — End: 2021-12-30

## 2021-12-29 MED ORDER — FAMOTIDINE 20 MG PO TABS
20.0000 mg | ORAL_TABLET | Freq: Two times a day (BID) | ORAL | Status: DC
Start: 2021-12-29 — End: 2021-12-30
  Administered 2021-12-29 – 2021-12-30 (×3): 20 mg via ORAL

## 2021-12-29 MED ORDER — SODIUM PHOSPHATES 3 MMOLE/ML IV SOLN (WRAP)
15.0000 mmol | Freq: Once | INTRAVENOUS | Status: DC
Start: 2021-12-29 — End: 2021-12-29

## 2021-12-29 MED ORDER — SENNOSIDES-DOCUSATE SODIUM 8.6-50 MG PO TABS
2.0000 | ORAL_TABLET | Freq: Every evening | ORAL | Status: DC
Start: 2021-12-29 — End: 2021-12-30
  Administered 2021-12-29: 2 via ORAL

## 2021-12-29 MED ORDER — ACETAMINOPHEN 325 MG PO TABS
650.0000 mg | ORAL_TABLET | Freq: Four times a day (QID) | ORAL | Status: DC
Start: 2021-12-29 — End: 2021-12-30
  Administered 2021-12-29 – 2021-12-30 (×5): 650 mg via ORAL

## 2021-12-29 MED ORDER — POTASSIUM & SODIUM PHOSPHATES 280-160-250 MG PO PACK
2.0000 | PACK | ORAL | Status: AC
Start: 2021-12-29 — End: 2021-12-29
  Administered 2021-12-29 (×3): 2 via ORAL

## 2021-12-29 NOTE — Nursing Progress Note (Addendum)
NURSE NOTE SUMMARY  [name] - 4TH SURGICAL   Patient Name: BOBBYE, PETTI   Attending Physician: Rea College, MD   Today's date:   12/29/2021 LOS: 6 days   Shift Summary:                                                              S/p RA diagnostic lap w LOA (7/25); abdominal lap sites CDI, OTA. Abdomen soft; patient reports passing flatus and having multiple diarrhea BM overnight.  Diet advanced this morning to full liquids. Patient with urinary urgency and frequency. BLE +1 pitting edema noted. IV fluids d/c.   1500- Patient tolerating full liquids well, diet advanced again to post op solids.   Patient ambulating in hallway with standby assist.    Provider Notifications:        Rapid Response Notifications:  Mobility:      PMP Activity: Step 6 - Walks in Room (12/28/2021  9:30 PM)     Weight tracking:  Family Dynamic:   No data found.          Last Bowel Movement   Last BM Date: 12/28/21 (per patient)

## 2021-12-29 NOTE — Progress Notes (Signed)
DAILY PROGRESS NOTE - Trauma Acute Care Surgery (TACS)   Name:  Nicole Sutton, Nicole Sutton     DOB:  1951-01-07   MR#:  [id number]               ROOM: 451/451-A    DATE:  12/29/21      Principal Diagnosis:  SBO (small bowel obstruction)    Refer to below for diagnoses being addressed for this encounter    ASSESSMENT & PLAN:                                                               Hospital Day: 7    s/p robotic dx lap with LOA  CONDITION:  stable    Patient Active Hospital Problem List:       SBO (small bowel obstruction) (12/23/2021)           Assessment: s/p OR, +bm/flatus, tolerated clears, NGT removed 7/26        Plan: advance to fulls (crackers okay), IS, SCDs, most likely d/c home tomorrow               Acute appendicitis       Assessment: unlikely       Plan: abx completed               Gastroesophageal reflux disease (12/24/2021)           Assessment: POA        Plan: home meds started         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  Discussed with bedside RN      I have examined this patient and have reviewed the notes, assessments and/or procedures performed by Massie Kluver. I concur with her/his documentation of this patient--with these modifications made directly to the note.      Stable clinically.  Tolerated removal of NGT and slow diet advancement.  Slow ROBF.  Pain well controlled.  Incisions C/D/I.      Terrill Mohr MD  Trauma/Acute Care Surgery (TACS)  [facility]  Phone # [phone]  Pager # [phone]      Subjective/Chief Complaint & ROS:   No significant complaints, ready to  eat solid foods    24-hour interval history:  NGT removed, Clears advanced to fulls    Meds:   acetaminophen, 650 mg, Oral, 4 times per day  acetaminophen, 1,000 mg, Intravenous, Q6H  enoxaparin, 40 mg, Subcutaneous, Q24H  famotidine, 20 mg, Oral, Q12H [facility]  potassium & sodium phosphates, 2 packet, Oral, Q4H [facility]  sodium chloride (PF), 3 mL, Intravenous, Q12H [facility]  Infusions:       DVT Prophylaxis:  enoxaparin 40 mg SQ Daily  Medication VTE Prophylaxis Orders: enoxaparin (LOVENOX) syringe 40 mg  Mechanical VTE Prophylaxis Orders: Mechanical VTE: Pneumatic Compression; Knee high    Foley:  No          GI Prophylaxis:  Yes      BM last 24 Hours:  Yes   Bowel Regimen:  Yes  Diet:  Diet full liquid Patient preferences: make have crackers w/ PB, honey, butter, etc    I & O's:  Data reviewed UO - 200/x4, BM - 6x  Pain: reasonably controlled  EXAM:   Vitals:  Blood pressure 144/72, pulse 77, temperature 97.2 F (36.2 C), temperature source Temporal, resp. rate 17, height 1.702 m (5\' 7" ), weight 77 kg (169 lb 12.1 oz), SpO2 100 %.   General:   well-nourished, in no apparent distress  Pulmonary:   lungs clear to ausculation, no rales/rhonchi/wheezes  Cardiac:   regular rate and rhythm without murmurs/rubs/gallops  Abdomen:   soft, mild incisional tenderness without rebound and without guarding, non-distended, incision Dermabond is present  Neuro:   alert, oriented x 3, no gross focal neurologic deficits, moves all extremities well  Psych:   normal affect, insight good  Extremities:   no edema  HEENT:   normocephalic  Neck:   supple  Skin:   normothermic, warm    Lab Data Reviewed:  Yes - phos 2.1     Cultures:  none     CHEM:     Recent Labs   Lab 12/29/21  0435 12/28/21  0420 12/27/21  0413 12/26/21  0401 12/25/21  1109 12/25/21  0545   Glucose 78 103* 115* 119* 84  --    Sodium 134* 134* 134* 136 136  --    Potassium 4.2 3.6 3.6 4.2 3.9  --    Chloride 107 107 101 102 101  --    CO2 18.7* 21.6 24.6 21 25.3  --    BUN  12 15 17  27* 26*  --    Creatinine 0.87 0.83 0.83 1.01 1.00  --    Calcium 8.3* 7.8* 8.5 9.2 9.8  --    Magnesium 1.7 1.8 1.8 1.9  --  1.9   Phosphorus 2.1* 2.1* 2.4 2.1*  --  2.4     CBC:       Recent Labs   Lab 12/29/21  0435 12/28/21  0420 12/27/21  0413 12/26/21  0401 12/25/21  1109   WBC 8.5 7.6 8.1 7.5 9.2   Hemoglobin 9.9* 9.5* 11.1* 10.7* 11.0*   Hematocrit 31.8* 29.3* 35.3* 34.4* 34.9*   PLT CT 169 180 205 202 217   MCV 103* 102* 103* 101* 103*     BANDS:      POCT:      LFTs:        COAG:      Lactate:    Recent Labs   Lab 12/23/21  0356   Lactic Acid 1.2       Radiology:    No results found.

## 2021-12-29 NOTE — Progress Notes (Signed)
Called for PIV placement. Staff attempted multiple times without success.     Able to place #24g in left forearm without difficulty, site immediately began to swell/bruise with flushing. IV removed. Patient requested no additional attempts for the night. Discussed with charge RN - notify MD, consult vascular access. Staff to call as additional needs arise.    Maryjane Hurter, BSN RN CCRN  Rapid Response Team

## 2021-12-30 MED ORDER — DSS 100 MG PO CAPS
100.0000 mg | ORAL_CAPSULE | Freq: Every day | ORAL | Status: AC
Start: 2021-12-31 — End: ?

## 2021-12-30 MED ORDER — ACETAMINOPHEN 325 MG PO TABS
650.0000 mg | ORAL_TABLET | Freq: Four times a day (QID) | ORAL | Status: AC
Start: 2021-12-30 — End: ?

## 2021-12-30 MED ORDER — IBUPROFEN 600 MG PO TABS
600.0000 mg | ORAL_TABLET | Freq: Four times a day (QID) | ORAL | Status: DC | PRN
Start: 2021-12-30 — End: 2022-01-16

## 2021-12-30 MED ORDER — SENNOSIDES-DOCUSATE SODIUM 8.6-50 MG PO TABS
2.0000 | ORAL_TABLET | Freq: Every evening | ORAL | Status: AC
Start: 2021-12-30 — End: ?
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
## 2021-12-30 NOTE — Discharge Instr - AVS First Page (Signed)
Diet:  Low residue diet x 2 weeks then advance as tolerated to previous diet.    Activity:  As tolerated.  No strenuous activity of lifting greater than 10-15 pounds x 2 weeks then slowly increase as tolerated over the next 2 weeks.  If an activity hurts then you should stop.  No working or driving while on narcotic pain medications.    Medications:  Refer to the After Visit Summary for updates and new medications.  Take over-the-counter  Tylenol (aka acetaminophen) and Advil/Motrin (aka ibuprofen) or other NSAIDs as needed for mild to moderate pain or headache.  Take the medications on a scheduled basis (as instructed) for moderate to severe pain.  Use over-the-counter stool softeners and/or Senokot daily to twice daily as needed for constipation.    Wound/Drain Care:  If Dermabond is present, you may shower over this and pat dry after 24 hours.    Instructions:  Call/Return if temperature over 101.0, redness or purulent (pus) drainage or warmth to your incision(s), refractory nausea/vomiting, increased pain not relieved by medications, or new onset of chest pain or shortness or breath.  Continue to use your incentive spirometer at home at least 10 times every hour while awake.    For additional questions or concerns after your discharge, you may contact the Trauma Acute Care Surgery (TACS) Office with [facility] at [phone] between the hours of 8:00am - 4:30pm.  Any medication refill requests should be called to the office during daytime business hours.  If you have an urgent need that occurs after office hours, call [phone] and ask to speak to the Trauma Acute Care Surgery [facility]) physician on call.  No medication refills will be handled outside of business hours.  Emergencies should go to the Emergency Room and cannot be handled over the phone.    Trauma Acute Care Surgery (TACS) BOWEL MAINTENANCE GUIDELINES    The use of narcotic pain medications and decreased activity will lead to  constipation.  In order to prevent this from happening to you, please follow these recommendations:  Drink adequate amounts of fluid throughout the day.    Take an over-the-counter stool softener, such as Colace, once or twice a day depending on the firmness of your stools.  You may take another over-the-counter stool softener, such as Senokot or generic Senna, two tablets at bedtime.  Add Metamucil or other similar fiber supplement to your diet twice a day.    If constipation persists:   Add Milk of Magnesia two (2) tablespoons (30mL) every 6    hours (up to 8 doses).  If you are experiencing rectal fullness or pressure, try a Dulcolax and/or   glycerin suppository every 12 hours.    Avoid anti-diarrhea medications once your bowels begin to move.  Loose         stools should resolve on their own after 2-3 days.  Gas relief medications such as Gas-X or Gaviscon are acceptable for use.    Probiotic food products, such as yogurt, are acceptable especially if you are or have been on antibiotics.    Additional Recipe for Success (Natural Stool Softener):    -1 cup of unsweetened applesauce,   - cup of unsweetened prune juice,   -1 cup of unprocessed bran (ex., [employer]'s brand - can be found at  [employer], [employer],        [place], or other health food stores).    Mix together and store in refrigerator.    Take  2 tablespoons with a large glass of water daily and expect results 6-8 hours later.  You may add 1 tablespoon every 5-7 days if needed.   *can be used as long as desired, non-addictive, & gentle

## 2021-12-30 NOTE — Plan of Care (Signed)
NURSE NOTE SUMMARY  [facility] - 4TH SURGICAL   Patient Name: Nicole Sutton   Attending Physician: Leory Plowman, MD   Today's date:   12/30/2021 LOS: 7 days   Shift Summary:                                                              Received report and assumed care. Pt sitting up in chair upon assessment. Offers no concerns or complaints at this time. Remains independent in room. Call light within reach, will continue to monitor.     Provider Notifications:        Rapid Response Notifications:  Mobility:      PMP Activity: Step 6 - Walks in Room (12/30/2021  2:00 AM)     Weight tracking:  Family Dynamic:   No data found.          Last Bowel Movement   Last BM Date: 12/29/21        Problem: Altered GI Function  Goal: Fluid and electrolyte balance are achieved/maintained  Outcome: Progressing  Goal: Elimination patterns are normal or improving  Outcome: Progressing  Goal: Mobility/Activity is maintained at optimal level for patient  Outcome: Progressing     Problem: Moderate/High Fall Risk Score >5  Goal: Patient will remain free of falls  Outcome: Progressing

## 2021-12-30 NOTE — Progress Notes (Signed)
Quick Doc  [facility] - 4TH SURGICAL   Patient Name: [facility]   Attending Physician: Leory Plowman, MD   Today's date:   12/30/2021 LOS: 7 days   Expected Discharge Date      Quick  Assessment:                                                              ReAdmit Risk Score: 11.87    CM Comments: 12/30/21 SW RJ: Adm with SBO vs constipation. S/p robotic dx lap, LOA. FLD. Therapy recommending home with supervision.  Pt independent PTA. Lives alone, children provide support when needed.  MD anticipating [place] home today. No [place] needs identified.        Physical Discharge Disposition: Home                                   Mode of Transportation: Car     Patient/Family/POA notified of transfer plan: Patient informed only  Patient agreeable to discharge plan/expected d/c date?: Yes     Bedside nurse notified of transport plan?: Yes                       Physical Discharge Disposition: Home       Provider Notifications:             Harolyn Rutherford, MSW  Social Worker  [facility]

## 2021-12-30 NOTE — Progress Notes (Signed)
DAILY PROGRESS NOTE - Trauma Acute Care Surgery (TACS)   Name:  Nicole Sutton, Nicole Sutton     DOB:  1951/03/22   MR#:  [id number]               ROOM: 451/451-A    DATE:  12/30/21      Principal Diagnosis:  SBO (small bowel obstruction)    Refer to below for diagnoses being addressed for this encounter    ASSESSMENT & PLAN:                                                               Hospital Day: 8    s/p robotic dx lap with LOA  CONDITION:  stable    Patient Active Hospital Problem List:       SBO (small bowel obstruction) (12/23/2021)           Assessment: s/p OR, +bm/flatus, tolerated clears, NGT removed 7/26        Plan: advance to post op solids, IS, SCDs, d/c today               Acute appendicitis       Assessment: unlikely       Plan: abx completed               Gastroesophageal reflux disease (12/24/2021)           Assessment: POA        Plan: home meds started         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 Trauma Acute Care Surgery [facility]) Program  Phone [phone] or Pager [phone]    Incidental findings:  none  Additional Info:  Care plan and expectations reviewed with Patient  Questions were answered.  Discussed with bedside RN      I have examined this patient and have reviewed the notes, assessments and/or procedures performed by Massie Kluver. I concur with her/his documentation of this patient--with these modifications made directly to the note.      Doing well clinically.  Has flatus and BM.  Pain well controlled.  OOB.  Incisions C/D/I.  Ok to d/c home today with family.      Terrill Mohr MD  Trauma/Acute Care Surgery (TACS)  [facility]  Phone # [phone]  Pager # [phone]    Subjective/Chief Complaint & ROS:   No significant complaints, ready to leave    24-hour interval history:  POS diet    Meds:   acetaminophen, 650 mg, Oral, 4 times per day  docusate sodium, 100 mg, Oral,  Daily  enoxaparin, 40 mg, Subcutaneous, Q24H  famotidine, 20 mg, Oral, Q12H SCH  senna-docusate, 2 tablet, Oral, QHS  sodium chloride (PF), 3 mL, Intravenous, Q12H SCH        Infusions:       DVT Prophylaxis:  enoxaparin 40 mg SQ Daily  Medication VTE Prophylaxis Orders: enoxaparin (LOVENOX) syringe 40 mg  Mechanical VTE Prophylaxis Orders: Mechanical VTE: Pneumatic Compression; Knee high    Foley:  No  GI Prophylaxis:  Yes      BM last 24 Hours:  Yes   Bowel Regimen:  Yes  Diet:  Diet post-op solids    I & O's:  Data reviewed UO - 150 6x, BM - x2  Pain: reasonably controlled  EXAM:   Vitals:  Blood pressure 145/82, pulse 89, temperature 98.8 F (37.1 C), temperature source Temporal, resp. rate 16, height 1.702 m (5\' 7" ), weight 77 kg (169 lb 12.1 oz), SpO2 99 %.   General:   well-nourished, in no apparent distress  Pulmonary:   lungs clear to ausculation, no rales/rhonchi/wheezes  Cardiac:   regular rate and rhythm without murmurs/rubs/gallops  Abdomen:   soft, mild incisional tenderness without rebound and without guarding, non-distended, incision Dermabond is present  Neuro:   alert, oriented x 3, no gross focal neurologic deficits, moves all extremities well  Psych:   normal affect, insight good  Extremities:   no edema  HEENT:   normocephalic  Neck:   supple  Skin:   normothermic, warm    Lab Data Reviewed:  No labs today    Cultures:  none     CHEM:     Recent Labs   Lab 12/29/21  0435 12/28/21  0420 12/27/21  0413 12/26/21  0401 12/25/21  1109 12/25/21  0545   Glucose 78 103* 115* 119* 84  --    Sodium 134* 134* 134* 136 136  --    Potassium 4.2 3.6 3.6 4.2 3.9  --    Chloride 107 107 101 102 101  --    CO2 18.7* 21.6 24.6 21 25.3  --    BUN 12 15 17  27* 26*  --    Creatinine 0.87 0.83 0.83 1.01 1.00  --    Calcium 8.3* 7.8* 8.5 9.2 9.8  --    Magnesium 1.7 1.8 1.8 1.9  --  1.9   Phosphorus 2.1* 2.1* 2.4 2.1*  --  2.4     CBC:       Recent Labs   Lab 12/29/21  0435 12/28/21  0420 12/27/21  0413  12/26/21  0401 12/25/21  1109   WBC 8.5 7.6 8.1 7.5 9.2   Hemoglobin 9.9* 9.5* 11.1* 10.7* 11.0*   Hematocrit 31.8* 29.3* 35.3* 34.4* 34.9*   PLT CT 169 180 205 202 217   MCV 103* 102* 103* 101* 103*     BANDS:      POCT:      LFTs:        COAG:      Lactate:            Radiology:    No results found.

## 2022-01-02 NOTE — Retrospective Coding Query (Signed)
PHYSICIAN'S DOCUMENTATION                                                                      REQUEST                                                                         Date of Request:  01/02/2022  Type of Request:  DOCUMENTATION CLARIFICATION                                         Patient Name: Chandler, Stofer [facility]  Account #: [id number]  MR #: [id number]  Discharge Date: 12/30/2021       Dear Dr. Reola Calkins,    Ms. Duhon was admitted with a SBO.    The H&P states "OSH CT read as possible appendicitis."    The 12/30/21 Progress Note states "Acute appendicitis, unlikely, abx completed."    The Discharge Summary provides a discharge diagnosis of "Acute appendicitis."      Question to Physician:    Can you please clarify if acute appendicitis was treated or ruled out?      PHYSICIAN RESPONSE:        ____ Acute appendicitis is present and treated    __x__ Acute appendicitis is ruled out after further workup and monitoring    ____ Other (Please specify below)      Terrill Mohr MD  Trauma/Acute Care Surgery (TACS)  [facility]  Phone # [phone]  Pager # [phone]      Thank you for your time.    Coder Noel Christmas  Date 01/02/2022         This form is considered part of the permanent legal medical record.

## 2022-01-05 NOTE — Retrospective Coding Query (Signed)
PHYSICIAN'S DOCUMENTATION                                                                      REQUEST                                                                         Date of Request:  01/05/2022  Type of Request:  DOCUMENTATION CLARIFICATION                                         Patient Name: Nicole Sutton, Nicole Sutton [facility]  Account #: [id number]  MR #: [id number]  Discharge Date: 12/30/2021       Dear Dr. Reola Calkins,    Ms. Knick was admitted with a SBO and is status post robotic assisted laparoscopic lysis of adhesions.     The medical record reflects the following:    H&P: "Outside films: CT scan showing diffusely dilated bowel, and stool burden throughout. Read as enlarged appendix, possible partial SBO as there appears to be a transition, perihepatic, RLQ and pelvic fluid suggestive of ascites."    OP Note: "Peritoneal ascites was suctioned from the abdomen"    Ascites is not documented anywhere else within the medical record including any progress notes or in the discharge summary.     Question to Physician:    Since you are the attending, can you please clarify if the ascites is clinically significant?      PHYSICIAN RESPONSE:        __x__ Ascites is clinically significant, affected patient care and required evaluation, monitoring and/or treatment    ____ Ascites is not clinically significant, did not affect patient care and no evaluation, monitoring or treatment was provided    ____ Other (Please specify below)    Terrill Mohr MD  Trauma/Acute Care Surgery (TACS)  [facility]  Phone # [phone]  Pager # [phone]    Thank you for your time.    Coder Noel Christmas  Date 01/05/2022         This form is considered part of the permanent legal medical record.

## 2022-01-09 ENCOUNTER — Encounter
Admission: EM | Disposition: A | Discharge status: Home / Self Care | Payer: Self-pay | Source: Home / Self Care | Attending: Surgery

## 2022-01-09 ENCOUNTER — Inpatient Hospital Stay: Payer: Medicare PPO | Admitting: Anesthesiology

## 2022-01-09 ENCOUNTER — Inpatient Hospital Stay
Admission: EM | Admit: 2022-01-09 | Discharge: 2022-01-17 | DRG: 331 | Disposition: A | Discharge status: Home / Self Care | Payer: Medicare PPO | Attending: Surgery | Admitting: Surgery

## 2022-01-09 ENCOUNTER — Emergency Department: Payer: Medicare PPO

## 2022-01-09 DIAGNOSIS — R1031 Right lower quadrant pain: Secondary | ICD-10-CM

## 2022-01-09 DIAGNOSIS — R197 Diarrhea, unspecified: Secondary | ICD-10-CM

## 2022-01-09 DIAGNOSIS — K562 Volvulus: Secondary | ICD-10-CM | POA: Diagnosis present

## 2022-01-09 DIAGNOSIS — Z79899 Other long term (current) drug therapy: Secondary | ICD-10-CM

## 2022-01-09 DIAGNOSIS — K56609 Unspecified intestinal obstruction, unspecified as to partial versus complete obstruction: Principal | ICD-10-CM | POA: Diagnosis present

## 2022-01-09 DIAGNOSIS — Z91030 Bee allergy status: Secondary | ICD-10-CM

## 2022-01-09 DIAGNOSIS — Z85831 Personal history of malignant neoplasm of soft tissue: Secondary | ICD-10-CM

## 2022-01-09 DIAGNOSIS — Z881 Allergy status to other antibiotic agents status: Secondary | ICD-10-CM

## 2022-01-09 DIAGNOSIS — K458 Other specified abdominal hernia without obstruction or gangrene: Secondary | ICD-10-CM | POA: Diagnosis present

## 2022-01-09 DIAGNOSIS — N3281 Overactive bladder: Secondary | ICD-10-CM | POA: Diagnosis present

## 2022-01-09 DIAGNOSIS — Z9081 Acquired absence of spleen: Secondary | ICD-10-CM

## 2022-01-09 DIAGNOSIS — R112 Nausea with vomiting, unspecified: Secondary | ICD-10-CM

## 2022-01-09 DIAGNOSIS — Z905 Acquired absence of kidney: Secondary | ICD-10-CM

## 2022-01-09 DIAGNOSIS — R109 Unspecified abdominal pain: Secondary | ICD-10-CM

## 2022-01-09 DIAGNOSIS — R1011 Right upper quadrant pain: Secondary | ICD-10-CM

## 2022-01-09 DIAGNOSIS — K219 Gastro-esophageal reflux disease without esophagitis: Secondary | ICD-10-CM | POA: Diagnosis present

## 2022-01-09 DIAGNOSIS — Z886 Allergy status to analgesic agent status: Secondary | ICD-10-CM

## 2022-01-09 DIAGNOSIS — K565 Intestinal adhesions [bands], unspecified as to partial versus complete obstruction: Principal | ICD-10-CM | POA: Diagnosis present

## 2022-01-09 DIAGNOSIS — Z5331 Laparoscopic surgical procedure converted to open procedure: Secondary | ICD-10-CM

## 2022-01-09 HISTORY — PX: EXPLORATORY LAPAROSCOPY: SHX4078

## 2022-01-09 HISTORY — PX: LYSIS OF ADHESIONS: SHX510349

## 2022-01-09 HISTORY — DX: Malignant neoplasm of retroperitoneum: C48.0

## 2022-01-09 HISTORY — PX: EXPLORATORY LAPAROTOMY: SHX4079

## 2022-01-09 LAB — CBC AND DIFFERENTIAL
Basophils %: 1 % (ref 0.0–3.0)
Basophils Absolute: 0.1 10*3/uL (ref 0.0–0.3)
Eosinophils %: 2.1 % (ref 0.0–7.0)
Eosinophils Absolute: 0.1 10*3/uL (ref 0.0–0.8)
Hematocrit: 35.3 % — ABNORMAL LOW (ref 36.0–48.0)
Hemoglobin: 11.3 gm/dL — ABNORMAL LOW (ref 12.0–16.0)
Lymphocytes Absolute: 1.2 10*3/uL (ref 0.6–5.1)
Lymphocytes: 19.5 % (ref 15.0–46.0)
MCH: 33 pg (ref 28–35)
MCHC: 32 gm/dL (ref 32–36)
MCV: 102 fL — ABNORMAL HIGH (ref 80–100)
MPV: 9.6 fL (ref 6.0–10.0)
Monocytes Absolute: 0.6 10*3/uL (ref 0.1–1.7)
Monocytes: 9.5 % (ref 3.0–15.0)
Neutrophils %: 67.9 % (ref 42.0–78.0)
Neutrophils Absolute: 4.3 10*3/uL (ref 1.7–8.6)
PLT CT: 343 10*3/uL (ref 130–440)
RBC: 3.45 10*6/uL — ABNORMAL LOW (ref 3.80–5.00)
RDW: 13.2 % (ref 11.0–14.0)
WBC: 6.3 10*3/uL (ref 4.0–11.0)

## 2022-01-09 LAB — COMPREHENSIVE METABOLIC PANEL
ALT: 19 U/L (ref 0–55)
AST (SGOT): 25 U/L (ref 10–42)
Albumin/Globulin Ratio: 1.03 Ratio (ref 0.80–2.00)
Albumin: 3.9 gm/dL (ref 3.5–5.0)
Alkaline Phosphatase: 54 U/L (ref 40–145)
Anion Gap: 14 mMol/L (ref 7.0–18.0)
BUN / Creatinine Ratio: 23.3 Ratio (ref 10.0–30.0)
BUN: 24 mg/dL — ABNORMAL HIGH (ref 7–22)
Bilirubin, Total: 0.5 mg/dL (ref 0.1–1.2)
CO2: 25 mMol/L (ref 20–30)
Calcium: 9.9 mg/dL (ref 8.5–10.5)
Chloride: 104 mMol/L (ref 98–110)
Creatinine: 1.03 mg/dL (ref 0.60–1.20)
EGFR: 58 mL/min/{1.73_m2} — ABNORMAL LOW (ref 60–150)
Globulin: 3.8 gm/dL (ref 2.0–4.0)
Glucose: 130 mg/dL — ABNORMAL HIGH (ref 71–99)
Osmolality Calculated: 283 mOsm/kg (ref 275–300)
Potassium: 4 mMol/L (ref 3.5–5.3)
Protein, Total: 7.7 gm/dL (ref 6.0–8.3)
Sodium: 139 mMol/L (ref 136–147)

## 2022-01-09 LAB — ECG 12-LEAD
P Wave Axis: 68 deg
P-R Interval: 124 ms
Patient Age: 71 years
Q-T Interval(Corrected): 457 ms
Q-T Interval: 369 ms
T Axis: 57 years
Ventricular Rate: 92 //min

## 2022-01-09 LAB — LIPASE: Lipase: 17 U/L (ref 8–78)

## 2022-01-09 LAB — VH EXTRA SPECIMEN LABEL

## 2022-01-09 LAB — I-STAT LACTIC ACID
Lactic Acid I-Stat: 0.8 mMol/L (ref 0.5–1.9)
Room Number I-Stat: 20

## 2022-01-09 LAB — VH I-STAT LACTIC ACID NOTIFICATION

## 2022-01-09 SURGERY — EXPLORATORY LAPAROSCOPY
Anesthesia: Anesthesia General | Site: Abdomen | Wound class: Dirty or Infected

## 2022-01-09 MED ORDER — LACTATED RINGERS IV SOLN
INTRAVENOUS | Status: DC
Start: 2022-01-10 — End: 2022-01-10

## 2022-01-09 MED ORDER — HYDROMORPHONE HCL 0.5 MG/0.5 ML IJ SOLN
INTRAMUSCULAR | Status: AC
Start: 2022-01-09 — End: ?
  Filled 2022-01-09: qty 1

## 2022-01-09 MED ORDER — HYDROMORPHONE HCL 0.5 MG/0.5 ML IJ SOLN
INTRAMUSCULAR | Status: AC
Start: 2022-01-09 — End: ?
  Filled 2022-01-09: qty 0.5

## 2022-01-09 MED ORDER — STERILE WATER FOR INJECTION IJ SOLN
INTRAMUSCULAR | Status: DC | PRN
Start: 2022-01-09 — End: 2022-01-09
  Administered 2022-01-09: 2 g via INTRAVENOUS

## 2022-01-09 MED ORDER — LIDOCAINE HCL (PF) 2 % IJ SOLN
INTRAMUSCULAR | Status: AC
Start: 2022-01-09 — End: ?
  Filled 2022-01-09: qty 5

## 2022-01-09 MED ORDER — VH PHENYLEPHRINE 12 MG IN NS 100 ML (SIMPLE)
INTRAVENOUS | Status: DC | PRN
Start: 2022-01-09 — End: 2022-01-09
  Administered 2022-01-09: 120 ug via INTRAVENOUS
  Administered 2022-01-09: 240 ug via INTRAVENOUS
  Administered 2022-01-09: 120 ug via INTRAVENOUS
  Administered 2022-01-09: 240 ug via INTRAVENOUS
  Administered 2022-01-09: 60 ug/min via INTRAVENOUS
  Administered 2022-01-09: 240 ug via INTRAVENOUS
  Administered 2022-01-09: 120 ug via INTRAVENOUS

## 2022-01-09 MED ORDER — HYDRALAZINE HCL 20 MG/ML IJ SOLN
10.0000 mg | Freq: Once | INTRAMUSCULAR | Status: AC | PRN
Start: 2022-01-09 — End: 2022-01-09

## 2022-01-09 MED ORDER — ONDANSETRON 4 MG PO TBDP
4.0000 mg | ORAL_TABLET | Freq: Three times a day (TID) | ORAL | Status: DC | PRN
Start: 2022-01-09 — End: 2022-01-17

## 2022-01-09 MED ORDER — SUGAMMADEX SODIUM 200 MG/2ML IV SOLN
INTRAVENOUS | Status: AC
Start: 2022-01-09 — End: ?
  Filled 2022-01-09: qty 2

## 2022-01-09 MED ORDER — SUCCINYLCHOLINE CHLORIDE 20 MG/ML IJ SOLN
INTRAMUSCULAR | Status: AC
Start: 2022-01-09 — End: ?
  Filled 2022-01-09: qty 10

## 2022-01-09 MED ORDER — FENTANYL CITRATE (PF) 50 MCG/ML IJ SOLN (WRAP)
25.0000 ug | INTRAMUSCULAR | Status: DC | PRN
Start: 2022-01-09 — End: 2022-01-10

## 2022-01-09 MED ORDER — VH SODIUM CHLORIDE 0.9 % IV BOLUS
1000.0000 mL | Freq: Once | INTRAVENOUS | Status: AC
Start: 2022-01-09 — End: 2022-01-09
  Administered 2022-01-09: 1000 mL via INTRAVENOUS

## 2022-01-09 MED ORDER — VH DEXAMETHASONE SOD PHOSPHATE PF 10 MG/ML IJ (WRAP)
INTRAMUSCULAR | Status: AC
Start: 2022-01-09 — End: ?
  Filled 2022-01-09: qty 1

## 2022-01-09 MED ORDER — IOHEXOL 350 MG/ML IV SOLN
100.0000 mL | Freq: Once | INTRAVENOUS | Status: AC | PRN
Start: 2022-01-09 — End: 2022-01-09
  Administered 2022-01-09: 100 mL via INTRAVENOUS

## 2022-01-09 MED ORDER — ACETAMINOPHEN 10 MG/ML IV SOLN
INTRAVENOUS | Status: DC | PRN
Start: 2022-01-09 — End: 2022-01-09
  Administered 2022-01-09: 1000 mg via INTRAVENOUS

## 2022-01-09 MED ORDER — DIPHENHYDRAMINE HCL 50 MG/ML IJ SOLN
25.0000 mg | Freq: Four times a day (QID) | INTRAMUSCULAR | Status: DC | PRN
Start: 2022-01-09 — End: 2022-01-10

## 2022-01-09 MED ORDER — ONDANSETRON HCL 4 MG/2ML IJ SOLN
4.0000 mg | Freq: Three times a day (TID) | INTRAMUSCULAR | Status: DC | PRN
Start: 2022-01-09 — End: 2022-01-17

## 2022-01-09 MED ORDER — OXYCODONE HCL 5 MG/5ML PO SOLN
5.0000 mg | ORAL | Status: DC | PRN
Start: 2022-01-09 — End: 2022-01-10

## 2022-01-09 MED ORDER — DOCUSATE SODIUM 100 MG PO CAPS
100.0000 mg | ORAL_CAPSULE | Freq: Every day | ORAL | Status: DC
Start: 2022-01-09 — End: 2022-01-10

## 2022-01-09 MED ORDER — LACTATED RINGERS IV SOLN
INTRAVENOUS | Status: DC | PRN
Start: 2022-01-09 — End: 2022-01-09

## 2022-01-09 MED ORDER — ROCURONIUM BROMIDE 50 MG/5ML IV SOLN
INTRAVENOUS | Status: AC
Start: 2022-01-09 — End: ?
  Filled 2022-01-09: qty 5

## 2022-01-09 MED ORDER — FENTANYL CITRATE (PF) 50 MCG/ML IJ SOLN (WRAP)
INTRAMUSCULAR | Status: AC
Start: 2022-01-09 — End: ?
  Filled 2022-01-09: qty 2

## 2022-01-09 MED ORDER — SUGAMMADEX SODIUM 200 MG/2ML IV SOLN
INTRAVENOUS | Status: DC | PRN
Start: 2022-01-09 — End: 2022-01-09
  Administered 2022-01-09: 200 mg via INTRAVENOUS

## 2022-01-09 MED ORDER — SUCCINYLCHOLINE CHLORIDE 20 MG/ML IJ SOLN
INTRAMUSCULAR | Status: DC | PRN
Start: 2022-01-09 — End: 2022-01-09
  Administered 2022-01-09: 180 mg via INTRAVENOUS

## 2022-01-09 MED ORDER — VH PROPOFOL INFUSION 10 MG/ML (WRAPPED)
INTRAVENOUS | Status: DC | PRN
Start: 2022-01-09 — End: 2022-01-09
  Administered 2022-01-09: 200 mg via INTRAVENOUS

## 2022-01-09 MED ORDER — KETOROLAC TROMETHAMINE 30 MG/ML IJ SOLN
15.0000 mg | Freq: Four times a day (QID) | INTRAMUSCULAR | Status: DC
Start: 2022-01-09 — End: 2022-01-10
  Administered 2022-01-09: 15 mg via INTRAVENOUS

## 2022-01-09 MED ORDER — VH ALBUMIN HUMAN/BIOSIMILAR 25 % IV SOLN (WRAP)
INTRAVENOUS | Status: DC | PRN
Start: 2022-01-09 — End: 2022-01-09
  Administered 2022-01-09: 12.5 g via INTRAVENOUS

## 2022-01-09 MED ORDER — ONDANSETRON HCL 4 MG/2ML IJ SOLN
4.0000 mg | Freq: Once | INTRAMUSCULAR | Status: AC
Start: 2022-01-09 — End: 2022-01-09
  Administered 2022-01-09: 4 mg via INTRAVENOUS

## 2022-01-09 MED ORDER — OXYCODONE HCL 5 MG PO TABS
5.0000 mg | ORAL_TABLET | ORAL | Status: DC | PRN
Start: 2022-01-09 — End: 2022-01-10

## 2022-01-09 MED ORDER — PROCHLORPERAZINE EDISYLATE 10 MG/2ML IJ SOLN
5.0000 mg | Freq: Once | INTRAMUSCULAR | Status: DC | PRN
Start: 2022-01-09 — End: 2022-01-10

## 2022-01-09 MED ORDER — GLYCOPYRROLATE 0.2 MG/ML IJ SOLN (WRAP)
INTRAMUSCULAR | Status: DC | PRN
Start: 2022-01-09 — End: 2022-01-09
  Administered 2022-01-09: .2 mg via INTRAVENOUS

## 2022-01-09 MED ORDER — HYDROMORPHONE HCL 0.5 MG/0.5 ML IJ SOLN
0.5000 mg | Freq: Once | INTRAMUSCULAR | Status: AC
Start: 2022-01-09 — End: 2022-01-09
  Administered 2022-01-09: 0.5 mg via INTRAVENOUS

## 2022-01-09 MED ORDER — ONDANSETRON HCL 4 MG/2ML IJ SOLN
INTRAMUSCULAR | Status: AC
Start: 2022-01-09 — End: ?
  Filled 2022-01-09: qty 2

## 2022-01-09 MED ORDER — VH HYDROMORPHONE HCL PF 1 MG/ML CARPUJECT
0.2500 mg | INTRAMUSCULAR | Status: DC | PRN
Start: 2022-01-09 — End: 2022-01-10

## 2022-01-09 MED ORDER — LACTATED RINGERS IV SOLN
INTRAVENOUS | Status: DC
Start: 2022-01-09 — End: 2022-01-11

## 2022-01-09 MED ORDER — OXYCODONE HCL 5 MG/5ML PO SOLN
10.0000 mg | ORAL | Status: DC | PRN
Start: 2022-01-09 — End: 2022-01-10

## 2022-01-09 MED ORDER — ESCITALOPRAM OXALATE 10 MG PO TABS
5.0000 mg | ORAL_TABLET | Freq: Every morning | ORAL | Status: DC
Start: 2022-01-10 — End: 2022-01-10

## 2022-01-09 MED ORDER — BUPIVACAINE HCL (PF) 0.25 % IJ SOLN
INTRAMUSCULAR | Status: AC
Start: 2022-01-09 — End: ?
  Filled 2022-01-09: qty 60

## 2022-01-09 MED ORDER — SODIUM CHLORIDE (PF) 0.9 % IJ SOLN
0.4000 mg | INTRAMUSCULAR | Status: DC | PRN
Start: 2022-01-09 — End: 2022-01-15

## 2022-01-09 MED ORDER — ROCURONIUM BROMIDE 50 MG/5ML IV SOLN
INTRAVENOUS | Status: DC | PRN
Start: 2022-01-09 — End: 2022-01-09
  Administered 2022-01-09: 10 mg via INTRAVENOUS
  Administered 2022-01-09 (×2): 50 mg via INTRAVENOUS
  Administered 2022-01-09: 40 mg via INTRAVENOUS

## 2022-01-09 MED ORDER — ACETAMINOPHEN 500 MG PO TABS
1000.0000 mg | ORAL_TABLET | Freq: Four times a day (QID) | ORAL | Status: DC
Start: 2022-01-09 — End: 2022-01-10

## 2022-01-09 MED ORDER — DEXAMETHASONE SODIUM PHOSPHATE 4 MG/ML IJ SOLN (WRAP)
INTRAMUSCULAR | Status: DC | PRN
Start: 2022-01-09 — End: 2022-01-09
  Administered 2022-01-09: 4 mg via INTRAVENOUS

## 2022-01-09 MED ORDER — HYDROMORPHONE HCL 0.5 MG/0.5 ML IJ SOLN
1.0000 mg | Freq: Once | INTRAMUSCULAR | Status: AC
Start: 2022-01-09 — End: 2022-01-09
  Administered 2022-01-09: 1 mg via INTRAVENOUS

## 2022-01-09 MED ORDER — HYDRALAZINE HCL 20 MG/ML IJ SOLN
INTRAMUSCULAR | Status: AC
Start: 2022-01-09 — End: 2022-01-09
  Administered 2022-01-09: 10 mg via INTRAVENOUS
  Filled 2022-01-09: qty 1

## 2022-01-09 MED ORDER — PROPOFOL 200 MG/20ML IV EMUL
INTRAVENOUS | Status: AC
Start: 2022-01-09 — End: ?
  Filled 2022-01-09: qty 20

## 2022-01-09 MED ORDER — FENTANYL CITRATE (PF) 50 MCG/ML IJ SOLN (WRAP)
INTRAMUSCULAR | Status: DC | PRN
Start: 2022-01-09 — End: 2022-01-09
  Administered 2022-01-09 (×2): 50 ug via INTRAVENOUS

## 2022-01-09 MED ORDER — ACETAMINOPHEN 160 MG/5ML PO SOLN
1000.0000 mg | Freq: Four times a day (QID) | ORAL | Status: DC
Start: 2022-01-09 — End: 2022-01-10
  Administered 2022-01-10: 1000 mg via ORAL
  Filled 2022-01-09: qty 40.6

## 2022-01-09 MED ORDER — VH DEXAMETHASONE SOD PHOSPHATE PF 10 MG/ML IJ (WRAP)
INTRAMUSCULAR | Status: AC | PRN
Start: 2022-01-09 — End: 2022-01-09

## 2022-01-09 MED ORDER — HYDROMORPHONE HCL 0.5 MG/0.5 ML IJ SOLN
0.5000 mg | INTRAMUSCULAR | Status: DC | PRN
Start: 2022-01-09 — End: 2022-01-10
  Administered 2022-01-09: 0.5 mg via INTRAVENOUS

## 2022-01-09 MED ORDER — OXYCODONE HCL 5 MG PO TABS
10.0000 mg | ORAL_TABLET | ORAL | Status: DC | PRN
Start: 2022-01-09 — End: 2022-01-10

## 2022-01-09 MED ORDER — ROCURONIUM BROMIDE 50 MG/5ML IV SOLN
INTRAVENOUS | Status: AC
Start: 2022-01-09 — End: ?
  Filled 2022-01-09: qty 15

## 2022-01-09 MED ORDER — KETOROLAC TROMETHAMINE 30 MG/ML IJ SOLN
INTRAMUSCULAR | Status: AC
Start: 2022-01-09 — End: ?
  Filled 2022-01-09: qty 1

## 2022-01-09 MED ORDER — DEXAMETHASONE SODIUM PHOSPHATE 4 MG/ML IJ SOLN
INTRAMUSCULAR | Status: AC
Start: 2022-01-09 — End: ?
  Filled 2022-01-09: qty 1

## 2022-01-09 MED ORDER — ONDANSETRON HCL 4 MG/2ML IJ SOLN
INTRAMUSCULAR | Status: DC | PRN
Start: 2022-01-09 — End: 2022-01-09
  Administered 2022-01-09: 4 mg via INTRAVENOUS

## 2022-01-09 MED ORDER — CEFAZOLIN SODIUM 1 G IJ SOLR
INTRAMUSCULAR | Status: AC
Start: 2022-01-09 — End: ?
  Filled 2022-01-09: qty 2000

## 2022-01-09 MED ORDER — HYDRALAZINE HCL 20 MG/ML IJ SOLN
10.0000 mg | INTRAMUSCULAR | Status: DC | PRN
Start: 2022-01-09 — End: 2022-01-17

## 2022-01-09 MED ORDER — FENTANYL CITRATE (PF) 50 MCG/ML IJ SOLN (WRAP)
50.0000 ug | INTRAMUSCULAR | Status: AC | PRN
Start: 2022-01-09 — End: 2022-01-10
  Administered 2022-01-09 – 2022-01-10 (×4): 50 ug via INTRAVENOUS
  Filled 2022-01-09 (×2): qty 2

## 2022-01-09 MED ORDER — BUPIVACAINE-EPINEPHRINE (PF) 0.25% -1:200000 IJ SOLN
INTRAMUSCULAR | Status: AC
Start: 2022-01-09 — End: ?
  Filled 2022-01-09: qty 30

## 2022-01-09 MED ORDER — DROPERIDOL 2.5 MG/ML IJ SOLN
0.6250 mg | Freq: Once | INTRAMUSCULAR | Status: AC | PRN
Start: 2022-01-09 — End: 2022-01-09
  Administered 2022-01-09: 0.625 mg via INTRAVENOUS
  Filled 2022-01-09: qty 2

## 2022-01-09 MED ORDER — LIDOCAINE HCL (PF) 2 % IJ SOLN
INTRAMUSCULAR | Status: DC | PRN
Start: 2022-01-09 — End: 2022-01-09
  Administered 2022-01-09: 100 mg via INTRADERMAL

## 2022-01-09 MED ORDER — ENOXAPARIN SODIUM 40 MG/0.4ML IJ SOSY
40.0000 mg | PREFILLED_SYRINGE | INTRAMUSCULAR | Status: DC
Start: 2022-01-09 — End: 2022-01-17
  Administered 2022-01-10 – 2022-01-16 (×7): 40 mg via SUBCUTANEOUS
  Filled 2022-01-09 (×7): qty 0.4

## 2022-01-09 MED ORDER — VH HYDROMORPHONE HCL PF 1 MG/ML CARPUJECT
0.5000 mg | INTRAMUSCULAR | Status: DC | PRN
Start: 2022-01-09 — End: 2022-01-10

## 2022-01-09 MED ORDER — VH DEXTROSE 5 % IN LACTATED RINGERS IV BOLUS
500.0000 mL | Freq: Once | INTRAVENOUS | Status: DC | PRN
Start: 2022-01-09 — End: 2022-01-10

## 2022-01-09 MED ORDER — SODIUM CHLORIDE (PF) 0.9 % IJ SOLN
3.0000 mL | Freq: Two times a day (BID) | INTRAMUSCULAR | Status: DC
Start: 2022-01-09 — End: 2022-01-11
  Administered 2022-01-10: 3 mL via INTRAVENOUS

## 2022-01-09 SURGICAL SUPPLY — 50 items
ADHESIVE DERMABOND HIVIC PEN (Dressings) IMPLANT
APPLIER LIGAMAX ENDO CLIP 5 MM (Supply) IMPLANT
CANNISTER 1200CC W/LOCK LID (Supply) ×3 IMPLANT
CHLORAPREP W/ORANGE TINT 26ML (Supply) ×3 IMPLANT
CORD DISPOSABLE CAUTERY 10FT (Supply) IMPLANT
CUTTER LINEAR 45MM ATS45 (Supply) ×3 IMPLANT
CUTTER LINEAR 55 MM (Supply) IMPLANT
CUTTER LINEAR 75 MM (Supply) ×3 IMPLANT
DRAPE BACK TABLE COVER (Supply) IMPLANT
DRAPE WARMING 66X44 (Supply) IMPLANT
DRSG SURGICEL 2 X 3 (Dressings) IMPLANT
ENSEAL LAP X1 37CM CURVED (Supply) ×3 IMPLANT
GLOVE BIOGEL UT M PI SUR SZ7.5 (Supply) ×3 IMPLANT
GLOVE BIOGEL UT PI SURG SZ 6.5 (Supply) ×3 IMPLANT
GLOVE BIOGEL UT PI SURG SZ 7 (Supply) ×3 IMPLANT
GLOVE BIOGEL UT PI SURG SZ 8 (Supply) ×3 IMPLANT
GOWN AURORA NONREINF STER LG (Supply) ×6 IMPLANT
GOWN AURORA NONREINF STER XLG (Supply) ×3 IMPLANT
GOWN WARMING  STANDARD BAIRPAW (Supply) ×3 IMPLANT
HEMOCLIP MED/LARGE (Supply) IMPLANT
NDL 18GA X 1.5 SAFETYGLIDE (Supply) IMPLANT
PACK ABDOMINAL LAP CDS760057 (Supply) ×3 IMPLANT
PACK DRAPE (Supply) ×3 IMPLANT
PACK DRAPE MAJOR (Supply) ×3 IMPLANT
PAD ARMBOARD 20 X 8 (Supply) ×3 IMPLANT
RELOAD LINEAR 6R45B WAS-TR45B (Supply) IMPLANT
SLEEVE 5MM Z-THREAD (Supply) ×3 IMPLANT
SOL SALINE .9% 1000ML  LF (Solutions) ×3 IMPLANT
SOL SALINE IRRIG 1000ML (Solutions) IMPLANT
SOL SALINE IRRIG 500ML (Supply) ×3 IMPLANT
SPONGE ENDOSCOPIC KITTNER (Supply) IMPLANT
SPONGE GAUZE 4 X 4 RAYTEC (Supply) IMPLANT
SPONGE LAP 18 X 18 (Supply) IMPLANT
STAPLER SKIN DISP35WIDE#PXW35 (Supply) IMPLANT
SUT PDS II 1 Z880G (Supply) ×6 IMPLANT
SUT SILK 3-0 C013D (Supply) ×3 IMPLANT
SUT VICRYL 3-0 J784G (Supply) ×3 IMPLANT
SYRINGE 10CC LL (Supply) IMPLANT
SYSTEM INZII RETRIEVAL (Supply) IMPLANT
TIP DISP STRYKERFLOW S/I (Supply) ×3 IMPLANT
TOWEL BLUE STERILE 6 PER PK (Supply) ×3 IMPLANT
TRAP FLUID FOR SMOKE EVAC (Supply) ×3 IMPLANT
TRAY URINEMETER FOLEY LATEXFRE (TDC (Tubes, Draines, Catheters)) ×1
TRAY URINEMETER FOLEY LATEXFRE (TDC (Tubes, Drains, Catheters)) ×2 IMPLANT
TROCAR 11MMX100MM KII THREADED (Supply) ×3 IMPLANT
TROCAR 12MM GELPORT BALL. HAS (Supply) IMPLANT
TUBING IRRIG SINGLE TUBE SET (Supply) IMPLANT
TUBING SMOVE EVAC HFLO (Supply) ×3 IMPLANT
UNIT RELOAD CUTTER 55MM (Supply) IMPLANT
UNIT RELOAD CUTTER 75MM STDRD (Supply) ×9 IMPLANT

## 2022-01-09 NOTE — ED Notes (Signed)
RN to bedside. Pt c/o pain, Pt medicated per MAR. Pt on monitor as appropriate. Pt needs and concerns addressed. Pt educated and updated on labs, imaging, and plan of care. Care continued.

## 2022-01-09 NOTE — Op Note (Signed)
FULL OPERATIVE NOTE    Date Time: 01/09/22 11:28 PM  Patient Name: Nicole Sutton  Attending Physician: Larna Daughters, DO      Date of Operation:   01/09/2022    Providers Performing:   Surgeon(s):  Orland Mustard, MD    Anesthesiologist: Malva Limes, DO; Ardelle Lesches, MD  CRNA: Lonna Duval, CRNA    Circulator: Robert Bellow, RN; Poppo, Dayton Scrape, RN  Relief Circulator: Michiel Cowboy, RN  Relief Scrub: Tanner, Sharice  Scrub Person: Jovita Gamma, Cleophus Molt  Certified Tech First Assistant: Linzie Collin, CSA    Anesthesia/Sedation:   general    Operative Procedure:   Procedure(s):  EXPLORATORY LAPAROSCOPY , SMALL BOWEL RESECTION - Wound Class: Dirty or Infected   EXPLORATORY LAPAROTOMY - Wound Class: Dirty or Infected   LYSIS OF ADHESIONS - Wound Class: Dirty or Infected     Preoperative Diagnosis:   Pre-Op Diagnosis Codes:     * 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  Thick adhesive band noted between descending colon and mid-jejunal SB and surrounding mesentery.  Volvulus of mesentery and scar tissue noted off axis from this band which was divided using sharp dissection, bovie electrocautery, and Enseal energy device.  Additional bands were lysed between loops of small bowel.  Colon was noted to be extensively mobilized, fixated along the splenic flexure and left lateral pelvic wall.  Thick adhesive band between descending colon and retroperitoneum was noted to have small bowel wrapped around, which was also divided.  A 15cm segment of jejunum was resected upon identifying a 1.5cm nodular mass on the wall of the small bowel. Functional side-to-side anastomosis performed with 75mm GIA blue load.  No active leaking or bleeding noted.  Two small serosal injuries were repaired primarily with 3-0 Vicryl sutures in the proximal jejunum which were  identified from running small bowel laparoscopically.  Small bowel was run multiple times from TI to LOT, layed in anatomic fashion, and no other mesenteric defects or adhesive bands were noted.  Colon was also inspected and found to be without evidence of injury.    Indications:   Pt is a 71y/oF with extensive abdominal surgical hx who presents with concern for mesenteric volvulus vs internal hernia with ischemic changes 2 weeks s/p robot-assisted laparoscopic LOA for an SBO.    Operative Notes:   Patient was identified in the pre-operative surgical holding area where consent was reviewed and obtained.  Patient was extensively counseled on initial plan for diagnostic laparoscopy with completion of any additional procedures.  Higher risk for conversion to open, possible need for resection of small bowel, possible lysis of more adhesions, and other indicated procedures were reviewed with the patient who expressed understanding and agreement to proceed as planned.    Patient was subsequently placed supine on the operating room table and general endotracheal anesthesia was induced.  Patient was noted to have tolerated induction well and her abdomen was prepped and draped in the standard sterile surgical fashion.  A surgical timeout was performed and upon ensuring that the patient had received peri-operative antibiotics and DVT prophylaxis, one of her prior RUQ port site incisions were opened with a knife and gasless optiview entry was used to enter her peritoneal cavity with a 5mm trochar.  Upon confirmation of entry, patient's peritoneal  cavity was insufflated to a pressure of which she tolerated well.      Bilateral laparoscopic TAP blocks were placed under direct visualization consisting of 0.25% Marcaine, injectable NS mixed 1:1 with 10mg  of dexamethasone.  Turbid and green fluid was noted in the peritoneum with thin fibrinous exudate covering the majority of dusky-looking small bowel.  This fluid was  cultured as well.  An additional 5mm trochar was placed in the RLQ and right lateral abdominal wall, allowing for the small bowel to be run proximally from the terminal ileum.  Normal looking small bowel was traced to the area of volvulized mesentery and a very thickened band with twisted mesentery, fixating multiple loops of small bowel with the descending colon was identified.  Multiple attempts at reduction through this defect and detorsion of the mesentery with loops of small bowel were unsuccessful due to the thick adhesion and stuck nature of this band and adjacent edematous loops of small bowel.  Two small serosal injuries were noted at this time from attempts at laparoscopic reduction and decision was made to convert to an exploratory laparotomy given duskiness of the small bowel and worsening hypotension per anesthesia.    An upper midline incision was made through patient's prior ex-lap scar.  The incision was deepened down to the level of the fascia and extended to the level of the umbilicus.  The peritoneal cavity was entered and upon clearing the fascial and abdominal wall edges, attention was turned to the left mid-abdomen where this dense adhesion was noted.    Careful manipulation with manual reduction of multiple loops of small bowel allowed for some increased freedom of movement, allowing this band to be divided in multiple layers to ensure it was not another twisted mesenteric structure.  The band was divided using sharp dissection, bovie electrocautery, and finally Enseal energy device for hemostasis.  The thick adhesion was noted to be from the medial aspect of the descending colon to a loop of mid-jejunum, involving the small bowel wall and mesentery.  Upon division of this band, the small bowel could be mobilized enough to splay out and be traced proximally to the LOT.      Running the small bowel, I came across a 1.5 cm lesion on the anti-mesenteric side of the mid-jejunum.  Lesion was noted  to be fixed, non-mobile, and seemingly had enteric contents in a well encapsulated, contained mass.  Given patient's oncologic history and uncertainty of the origin of this lesion, a 15cm segment of jejnum (7cm proximally and 7cm distally was resected with surrounding mesentery for diagnosis.  A functional side-to-side anatomosis was created using a 75mm linear GIA blue load stapler, and the common enterotomy was hand-sewn with a running layer of 3-0 Vicryl followed by interrupted 3-0 silk Lembert sutures.  Additional bands of adhesive disease were ligated between other loops of small bowel which were noted to be flimsy.  The previously identified serosal injuries of the proximal jejunum were repaired primarily with 3-0 Vicryl.    Inspection of the colon demonstrated relatively extensive colonic mobilization of the right colon, with meseteric attachments intact to the transverse colon, splenic flexure, and parts of the descending colon.  Patient was noted and palpated to have another thick adhesive band between the descending colon and left retroperitoneum where some small bowel was noted to have gotten stuck.  These loops were bluntly freed and the band was ligated using an Enseal energy device.      Upon ensuring all  major adhesions were divided, the peritoneum was irrigated with warm saline solution, the small bowel was run again from the TI to the LOT without evidence of other injuries or adhesive disease.  The small bowel and its mesentery was then allowed to fall back into place without significant twisting, laying in the appropriate orientation.  Warm Aricept solution was used to irrigate the peritoneal cavity.  After ensuring hemostasis, the abdomen was closed with two separate running looped 1-0 PDS sutures.  Skin staples used to close the incision.  All sponge, instrument, and needle counts were correct at the end of the case.  No other complications noted, patient was noted to be normotensive off pressors  by the end of the case.  She was subsequently extubated and taken to the PACU in stable condition.    Estimated Blood Loss:    * No values recorded between 01/09/2022  8:39 PM and 01/09/2022 11:20 PM *    Implants:   * No implants in log *    Drains:   Drains: no    Specimens:     ID Type Source Tests Collected by Time Destination   1 : Peritoneal Fluid Peritoneal Peritoneal Fluid VH FUNGAL OTHER AND SMEAR, VH CULTURE AND SMEAR, STERILE BODY FLUID, VH CULTURE, ANAEROBIC Orland Mustard, MD 01/09/2022 2148    A : Mesenteric Band Excision Abdomen [facility] SURGICAL PATHOLOGY Orland Mustard, MD 01/09/2022 2202    B : Mid jejunum Resection Jejunum [facility] SURGICAL PATHOLOGY Orland Mustard, MD 01/09/2022 2208          Complications:   * No complications entered in OR log *      Signed by: Orland Mustard, MD, MD
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
## 2022-01-09 NOTE — Transfer of Care (Signed)
Anesthesia Transfer of Care Note    Patient: Nicole Sutton    Last vitals:   Pulse 115  SPO2 92%  RR 18  BP 172/90     Oxygen: Room Air     Mental Status:awake    Airway: Natural    Cardiovascular Status:  stable

## 2022-01-09 NOTE — ED Notes (Signed)
OR called for report. Transport here to get Pt.
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
## 2022-01-09 NOTE — ED Triage Notes (Addendum)
Pt presents with RLQ pain that radiates to her back and N/V that started this morning. Pt suffers with chronic constipation. Has 2 BM yesterday and diarrhea today. Pt had laparoscopy for release of adhesions 2 weeks ago due to bowel obstruction. Pain 10/10.

## 2022-01-10 DIAGNOSIS — K56609 Unspecified intestinal obstruction, unspecified as to partial versus complete obstruction: Secondary | ICD-10-CM

## 2022-01-10 LAB — VH FUNGAL OTHER AND SMEAR: Fungal Smear: NONE SEEN

## 2022-01-10 LAB — HEPATIC FUNCTION PANEL
ALT: 19 U/L (ref 0–55)
AST (SGOT): 30 U/L (ref 10–42)
Albumin/Globulin Ratio: 1.03 Ratio (ref 0.80–2.00)
Albumin: 3.3 gm/dL — ABNORMAL LOW (ref 3.5–5.0)
Alkaline Phosphatase: 47 U/L (ref 40–145)
Bilirubin Direct: 0.2 mg/dL (ref 0.0–0.3)
Bilirubin, Total: 0.6 mg/dL (ref 0.1–1.2)
Globulin: 3.2 gm/dL (ref 2.0–4.0)
Protein, Total: 6.5 gm/dL (ref 6.0–8.3)

## 2022-01-10 LAB — BASIC METABOLIC PANEL
Anion Gap: 14.5 mMol/L (ref 7.0–18.0)
BUN / Creatinine Ratio: 24.4 Ratio (ref 10.0–30.0)
BUN: 20 mg/dL (ref 7–22)
CO2: 19 mMol/L — ABNORMAL LOW (ref 20–30)
Calcium: 8.5 mg/dL (ref 8.5–10.5)
Chloride: 107 mMol/L (ref 98–110)
Creatinine: 0.82 mg/dL (ref 0.60–1.20)
EGFR: 76 mL/min/{1.73_m2} (ref 60–150)
Glucose: 145 mg/dL — ABNORMAL HIGH (ref 71–99)
Osmolality Calculated: 277 mOsm/kg (ref 275–300)
Potassium: 4.5 mMol/L (ref 3.5–5.3)
Sodium: 136 mMol/L (ref 136–147)

## 2022-01-10 LAB — CBC AND DIFFERENTIAL
Basophils %: 0.2 % (ref 0.0–3.0)
Basophils Absolute: 0 10*3/uL (ref 0.0–0.3)
Eosinophils %: 0.1 % (ref 0.0–7.0)
Eosinophils Absolute: 0 10*3/uL (ref 0.0–0.8)
Hematocrit: 35.1 % — ABNORMAL LOW (ref 36.0–48.0)
Hemoglobin: 10.8 gm/dL — ABNORMAL LOW (ref 12.0–16.0)
Lymphocytes Absolute: 0.6 10*3/uL (ref 0.6–5.1)
Lymphocytes: 5 % — ABNORMAL LOW (ref 15.0–46.0)
MCH: 32 pg (ref 28–35)
MCHC: 31 gm/dL — ABNORMAL LOW (ref 32–36)
MCV: 105 fL — ABNORMAL HIGH (ref 80–100)
MPV: 8.8 fL (ref 6.0–10.0)
Monocytes Absolute: 0.3 10*3/uL (ref 0.1–1.7)
Monocytes: 2.5 % — ABNORMAL LOW (ref 3.0–15.0)
Neutrophils %: 92.2 % — ABNORMAL HIGH (ref 42.0–78.0)
Neutrophils Absolute: 11.4 10*3/uL — ABNORMAL HIGH (ref 1.7–8.6)
PLT CT: 309 10*3/uL (ref 130–440)
RBC: 3.36 10*6/uL — ABNORMAL LOW (ref 3.80–5.00)
RDW: 13.4 % (ref 11.0–14.0)
WBC: 12.4 10*3/uL — ABNORMAL HIGH (ref 4.0–11.0)

## 2022-01-10 LAB — ECG 12-LEAD
QRS Axis: -44 deg
QRS Duration: 107 ms

## 2022-01-10 LAB — PHOSPHORUS: Phosphorus: 2.8 mg/dL (ref 2.3–4.7)

## 2022-01-10 LAB — LACTIC ACID, PLASMA: Lactic Acid: 0.8 mMol/L (ref 0.5–1.9)

## 2022-01-10 LAB — MAGNESIUM: Magnesium: 1.8 mg/dL (ref 1.6–2.6)

## 2022-01-10 LAB — VH CULTURE AND SMEAR, STERILE BODY FLUID: Culture Result: NO GROWTH

## 2022-01-10 MED ORDER — VH HYDROMORPHONE HCL PF 1 MG/ML CARPUJECT
0.5000 mg | INTRAMUSCULAR | Status: DC | PRN
Start: 2022-01-10 — End: 2022-01-11
  Administered 2022-01-10 – 2022-01-11 (×10): 0.5 mg via INTRAVENOUS
  Filled 2022-01-10 (×8): qty 1

## 2022-01-10 MED ORDER — PHENOL 1.4 % MT LIQD
1.0000 | OROMUCOSAL | Status: DC | PRN
Start: 2022-01-10 — End: 2022-01-17

## 2022-01-10 MED ORDER — HYDRALAZINE HCL 20 MG/ML IJ SOLN
20.0000 mg | Freq: Once | INTRAMUSCULAR | Status: DC
Start: 2022-01-10 — End: 2022-01-10

## 2022-01-10 MED ORDER — STERILE WATER FOR INJECTION IJ SOLN
2.0000 g | INTRAMUSCULAR | Status: DC
Start: 2022-01-10 — End: 2022-01-10

## 2022-01-10 MED ORDER — ACETAMINOPHEN 10 MG/ML IV SOLN
1000.0000 mg | Freq: Four times a day (QID) | INTRAVENOUS | Status: AC
Start: 2022-01-10 — End: 2022-01-12
  Administered 2022-01-10 – 2022-01-12 (×7): 1000 mg via INTRAVENOUS
  Filled 2022-01-10 (×9): qty 100

## 2022-01-10 MED ORDER — SODIUM CHLORIDE (PF) 0.9 % IJ SOLN
0.4000 mg | INTRAMUSCULAR | Status: DC | PRN
Start: 2022-01-10 — End: 2022-01-17

## 2022-01-10 MED ORDER — ONDANSETRON 4 MG PO TBDP
4.0000 mg | ORAL_TABLET | Freq: Three times a day (TID) | ORAL | Status: DC | PRN
Start: 2022-01-10 — End: 2022-01-15

## 2022-01-10 MED ORDER — BENZOCAINE-MENTHOL MT LOZG (WRAP)
1.0000 | LOZENGE | OROMUCOSAL | Status: DC | PRN
Start: 2022-01-10 — End: 2022-01-17
  Administered 2022-01-10 (×2): 1 via BUCCAL
  Filled 2022-01-10: qty 18

## 2022-01-10 MED ORDER — SODIUM CHLORIDE (PF) 0.9 % IJ SOLN
20.0000 mg | Freq: Two times a day (BID) | INTRAVENOUS | Status: DC
Start: 2022-01-10 — End: 2022-01-14
  Administered 2022-01-10 – 2022-01-14 (×9): 20 mg via INTRAVENOUS
  Filled 2022-01-10 (×11): qty 2

## 2022-01-10 MED ORDER — ONDANSETRON HCL 4 MG/2ML IJ SOLN
4.0000 mg | Freq: Three times a day (TID) | INTRAMUSCULAR | Status: DC | PRN
Start: 2022-01-10 — End: 2022-01-15
  Administered 2022-01-12: 4 mg via INTRAVENOUS
  Filled 2022-01-10: qty 2

## 2022-01-10 MED ORDER — PIPERACILLIN-TAZOBACTAM IN DEX 3-0.375 GM/50ML IV SOLN
3.3750 g | Freq: Four times a day (QID) | INTRAVENOUS | Status: DC
Start: 2022-01-10 — End: 2022-01-10

## 2022-01-10 MED ORDER — SODIUM CHLORIDE (PF) 0.9 % IJ SOLN
3.0000 mL | Freq: Two times a day (BID) | INTRAMUSCULAR | Status: DC
Start: 2022-01-10 — End: 2022-01-17
  Administered 2022-01-10 – 2022-01-17 (×13): 3 mL via INTRAVENOUS

## 2022-01-10 MED ORDER — SODIUM CHLORIDE 0.9 % IV SOLN
4.5000 g | Freq: Four times a day (QID) | INTRAVENOUS | Status: AC
Start: 2022-01-10 — End: 2022-01-13
  Administered 2022-01-10 – 2022-01-13 (×13): 4.5 g via INTRAVENOUS
  Filled 2022-01-10 (×13): qty 20

## 2022-01-10 NOTE — Progress Notes (Signed)
Per the [facility] Automatic Renal Dosing Protocol:    The following medication was adjusted based on the patient's creatinine clearance:    Medication: Zosyn  Indication: intraabdominal infections  SCr: 1.03 mg/dL  CrCl: 16.1 ml/min  Previous Dosing Regimen: 3.375gm IV Q6h  New Dosing Regimen: Extended Infusion - 4.5gm IV Q6h - new standard dosing regimen at [facility]      [id number]  [facility]

## 2022-01-10 NOTE — Progress Notes (Signed)
Pt coughed and felt NGT in the back of her throat. Jessica PA called, NGT removed. Pt will remain NPO.

## 2022-01-10 NOTE — Plan of Care (Signed)
Problem: Compromised Tissue integrity  Goal: Nutritional status is improving  Outcome: Not Progressing  Flowsheets (Taken 01/10/2022 1712)  Nutritional status is improving: Include patient/patient care companion in decisions related to nutrition     Problem: Pain interferes with ability to perform ADL  Goal: Pain at adequate level as identified by patient  Outcome: Not Progressing  Flowsheets (Taken 01/10/2022 1712)  Pain at adequate level as identified by patient:   Assess pain on admission, during daily assessment and/or before any "as needed" intervention(s)   Assess for risk of opioid induced respiratory depression, including snoring/sleep apnea. Alert healthcare team of risk factors identified.   Reassess pain within 30-60 minutes of any procedure/intervention, per Pain Assessment, Intervention, Reassessment (AIR) Cycle   Evaluate patient's satisfaction with pain management progress   Offer non-pharmacological pain management interventions     Problem: Altered GI Function  Goal: Elimination patterns are normal or improving  Outcome: Not Progressing  Flowsheets (Taken 01/10/2022 1712)  Elimination patterns are normal or improving:   Report abnormal assessment to physician   Assess for normal bowel sounds   Anticipate/assist with toileting needs   Monitor for abdominal distension   Assess for flatus   Monitor for abdominal discomfort     Problem: Compromised Tissue integrity  Goal: Damaged tissue is healing and protected  Outcome: Progressing  Flowsheets (Taken 01/10/2022 1712)  Damaged tissue is healing and protected:   Monitor/assess Braden scale every shift   Increase activity as tolerated/progressive mobility   Keep intact skin clean and dry     Problem: Side Effects from Pain Analgesia  Goal: Patient will experience minimal side effects of analgesic therapy  Outcome: Progressing  Flowsheets (Taken 01/10/2022 1712)  Patient will experience minimal side effects of analgesic therapy:   Monitor/assess patient's  respiratory status (RR depth, effort, breath sounds)   Assess for changes in cognitive function   Prevent/manage side effects per LIP orders (i.e. nausea, vomiting, pruritus, constipation, urinary retention, etc.)   Evaluate for opioid-induced sedation with appropriate assessment tool (i.e. POSS)     Problem: Altered GI Function  Goal: Fluid and electrolyte balance are achieved/maintained  Outcome: Progressing  Flowsheets (Taken 01/10/2022 1712)  Fluid and electrolyte balance are achieved/maintained:   Monitor intake and output every shift   Monitor/assess lab values and report abnormal values   Provide adequate hydration   Assess and reassess fluid and electrolyte status  Goal: Mobility/Activity is maintained at optimal level for patient  Outcome: Progressing  Flowsheets (Taken 01/10/2022 1712)  Mobility/activity is maintained at optimal level for patient:   Increase mobility as tolerated/progressive mobility   Encourage independent activity per ability   Maintain proper body alignment   Plan activities to conserve energy, plan rest periods   Reposition patient every 2 hours and as needed unless able to reposition self

## 2022-01-10 NOTE — Progress Notes (Signed)
DAILY PROGRESS NOTE - Trauma Acute Care Surgery (TACS)   Name:  Nicole Sutton, Nicole Sutton     DOB:  [birth year]   MR#:  [id number]               ROOM: [facility] MAINPACU/[facility] MAINPACU    DATE:  01/10/22      Principal Diagnosis:  SBO (small bowel obstruction)    Refer to below for diagnoses being addressed for this encounter    ASSESSMENT & PLAN:                                                               Hospital Day: 2    s/p laparoscopic converted to exploratory laparotomy with SBR and LOA 8/7 with Dr Nedra Hai   CONDITION:  stable    Patient Active Hospital Problem List:  SBO (small bowel obstruction) (12/23/2021)           Assessment: sp above procedure, NGT in place with no output, pt reports abdominal pain with mild nausea without vomiting. No flatus.         Plan: NPO with IVF, NGT to LCWS, pain control and antiemetics, serial abdominal exams, monitor closely     Gastroesophageal reflux disease (12/24/2021)           Assessment: POA, chronic         Plan: IV famotidine         Pt stable post op. Pain present and pt receiving medications for pain.   NGT still in place. ARBF.     Doy Mince [place], [place]    X [phone]  [facility] Trauma Acute Care Surgery [facility]) Program  Phone [phone] or Pager [phone]    I agree with the history, exam, imaging and lab value interpretation, as well as the assessment and plan documented by Hayden Pedro, PA in this note  Gabriel Cirri, MD [phone]  Phone [phone]      Incidental findings:  none  Additional Info:  Care plan and expectations reviewed with Patient  Questions were answered.  Discussed with bedside RN    Subjective/Chief Complaint & ROS:   Diffuse abdominal pain. No N/V. No flatus or BM. Breathing without issue but pt reports feeling SOB due to abdominal pain.     24-hour interval history:  admission and OR     Meds:   acetaminophen, 1,000 mg, Oral, Q6H   Or  acetaminophen, 1,000 mg, Oral, Q6H  ceFAZolin, 2 g, Intravenous, 60 Min Pre-Op  enoxaparin, 40 mg, Subcutaneous, Q24H  famotidine, 20 mg,  Intravenous, Q12H SCH  piperacillin-tazobactam, 4.5 g, Intravenous, Q6H  sodium chloride (PF), 3 mL, Intravenous, Q12H SCH  sodium chloride (PF), 3 mL, Intravenous, Q12H [facility]        Infusions:   lactated ringers 100 mL/hr at 01/09/22 1942    lactated ringers 100 mL/hr at 01/10/22 0111        DVT Prophylaxis:  enoxaparin 40 mg SQ Daily  Medication VTE Prophylaxis Orders: enoxaparin (LOVENOX) syringe 40 mg  Mechanical VTE Prophylaxis Orders: Mechanical VTE: Pneumatic Compression; Knee high    Foley:  Yes          GI Prophylaxis:  Yes      BM last 24 Hours:  No   Bowel Regimen:  No  Diet:  Diet NPO effective now - Other (NG tube); Other- enter in Comments    I & O's:  Data reviewed Reviewed   Pain: reasonably controlled  EXAM:   Vitals:  Blood pressure 99/57, pulse 83, temperature (!) 100.9 F (38.3 C), temperature source Skin, resp. rate 14, height 1.702 m (5\' 7" ), weight 75.3 kg (166 lb 0.1 oz), SpO2 95 %.   General:   in no apparent distress, appears comfortable, non-toxic, elderly, frail  Pulmonary:   lungs clear to ausculation, equal breath sounds, unlabored respirations, no accessory muscle use  Cardiac:   regular rate and rhythm without murmurs/rubs/gallops  Abdomen:   hypoactive bowel sounds, soft, moderate generalized tenderness without rebound and without guarding, non-distended  Neuro:   alert, no gross focal neurologic deficits, moves all extremities well  Psych:   normal affect, insight good  Extremities:   no edema and peripheral pulses 2+ and symmetric  Skin:   no jaundice, no rashes, normothermic    Lab Data Reviewed:  Yes      Cultures:    Anaerobic cx - NGTD  Sterile body fluid cx/smear -  NGTD  Fungal cx - NGTD     CHEM:     Recent Labs   Lab 01/10/22  0524 01/09/22  1416   Glucose 145* 130*   Sodium 136 139   Potassium 4.5 4.0   Chloride 107 104   CO2 19* 25   BUN 20 24*   Creatinine 0.82 1.03   Calcium 8.5 9.9   Magnesium 1.8  --    Phosphorus 2.8  --      CBC:       Recent Labs   Lab  01/10/22  0524 01/09/22  1416   WBC 12.4* 6.3   Hemoglobin 10.8* 11.3*   Hematocrit 35.1* 35.3*   PLT CT 309 343   MCV 105* 102*     BANDS:      POCT:      LFTs:      Recent Labs   Lab 01/10/22  0524 01/09/22  1416   ALT 19 19   AST (SGOT) 30 25   Alkaline Phosphatase 47 54   Bilirubin, Total 0.6 0.5   Bilirubin Direct 0.2  --    Albumin 3.3* 3.9     COAG:      Lactate:    Recent Labs   Lab 01/10/22  0543   Lactic Acid 0.8       Radiology:    CT Abdomen Pelvis with IV Cont    Result Date: 01/09/2022  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 There is new twisting of the mesenteric vascular root, raising suspicion for an internal hernia or partial volvulus. No associated distention of small bowel loops to suggest  a high-grade obstruction at this time. However, there is distention of the mesenteric veins, mesenteric edema and bowel edema, compatible with an enteritis. Some degree of ischemia is not excluded. Recommend correlation with clinical exam/laboratory findings and consider surgical consultation. Small amount of free fluid. ReadingStation:[email or web address]

## 2022-01-10 NOTE — Progress Note - Problem Oriented Charting Notewrit (Signed)
Dr. Lorn Junes updated via phone re fever. Order recd to use per tube tylenol. Pt up to chair. Painful, dilaudid given. Jessica PA in to see pt. Pt c/o sore throat, PA will order lozenges for that. VS remain stable.

## 2022-01-10 NOTE — Progress Notes (Signed)
4 eyes in 4 hours pressure injury assessment note:      RN or CNA Completed with: Geralyn Flash RN           Bony Prominences: Check appropriate box below     If wound is present add wound to LDA avatar      Occiput:   [x]  WNL   []  Wound present    Face:                     [x]  WNL    []  Wound present    Ears:      [x]  WNL   []  Wound present    Spine:    [x]  WNL   []  Wound present    Shoulders:    [x]  WNL    []  Wound present    Elbows:    [x]  WNL    []  Wound present    Sacrum/coccyx:   [x]  WNL    []  Wound present    Ischial Tuberosity:  [x]  WNL  []  Wound present    Trochanter/Hip:       [x]  WNL  []  Wound present    Knees:     [x]  WNL  []  Wound present    Ankles:     [x]  WNL  []  Wound present    Heels:    [x]  WNL  []  Wound present      Other pressure areas:      Wound Location: ABRASION L HAND, MIDLINE ABD SCAR, MIDLINE ABD INCISION/ABD LAP SITES X3 (NEW)      Device related: []  Device:       Consult WOCN for guidance & staging of pressure injuries.

## 2022-01-10 NOTE — Progress Notes (Signed)
NURSE NOTE SUMMARY  [facility] - 4TH SURGICAL   Patient Name: [facility]   Attending Physician: Larna Daughters, DO   Today's date:   01/10/2022 LOS: 1 days   Shift Summary:                                                              S/P LOA for SBO, return for concerns internal hernia. S/p ex lap SBR, LOA. Arrives from PACU pain 7/10; provided IV dilaudid for abd pain. Pt resting comfortably throughout evening. Maintaining NPO, IVF hydration. IV abx started. SCDs on while in bed. Refused ambulation at this time but may try this evening. IS use while in bed achieved 750. Abd lap sites intact and midline incision with surgical foam dressing. Denies any needs rest of shift. Guinevere Scarlet, RN      Provider Notifications:        Rapid Response Notifications:  Mobility:      PMP Activity: Step 3 - Bed Mobility (01/10/2022  5:30 PM)     Weight tracking:  Family Dynamic:   Last 3 Weights for the past 72 hrs (Last 3 readings):   Weight   01/09/22 1358 75.3 kg (166 lb 0.1 oz)             Last Bowel Movement   Last BM Date: 01/08/22

## 2022-01-10 NOTE — Nursing Progress Note (Signed)
NURSE NOTE SUMMARY  [facility] - 4TH SURGICAL   Patient Name: [facility]   Attending Physician: Larna Daughters, DO   Today's date:   01/10/2022 LOS: 1 days   Shift Summary:                                                              Pt arrived to unit at 1330 from PACU. Adm for SBO. Surgical intervention includes ex lap, and SBR.   Pt had sat up in chair downstairs. Reports no flatus or BM. Pain is controlled by PRN dilaudid Q2h. Pt is NPO at this time, uses throat lozenges for comfort. Has midline incision and lap sites w/ staples. Foley catheter draining clear, yellow urine.     Provider Notifications:        Rapid Response Notifications:  Mobility:      PMP Activity: Step 3 - Bed Mobility (01/10/2022  5:30 PM)     Weight tracking:  Family Dynamic:   Last 3 Weights for the past 72 hrs (Last 3 readings):   Weight   01/09/22 1358 75.3 kg (166 lb 0.1 oz)             Last Bowel Movement   Last BM Date: 01/08/22

## 2022-01-11 ENCOUNTER — Encounter: Payer: Self-pay | Admitting: Surgery

## 2022-01-11 LAB — BASIC METABOLIC PANEL
Anion Gap: 9.7 mMol/L (ref 7.0–18.0)
BUN / Creatinine Ratio: 21.7 Ratio (ref 10.0–30.0)
BUN: 18 mg/dL (ref 7–22)
CO2: 24 mMol/L (ref 20–30)
Calcium: 7.9 mg/dL — ABNORMAL LOW (ref 8.5–10.5)
Chloride: 107 mMol/L (ref 98–110)
Creatinine: 0.83 mg/dL (ref 0.60–1.20)
EGFR: 75 mL/min/{1.73_m2} (ref 60–150)
Glucose: 88 mg/dL (ref 71–99)
Osmolality Calculated: 275 mOsm/kg (ref 275–300)
Potassium: 3.7 mMol/L (ref 3.5–5.3)
Sodium: 137 mMol/L (ref 136–147)

## 2022-01-11 LAB — VH SURGICAL PATHOLOGY

## 2022-01-11 LAB — CBC AND DIFFERENTIAL
Basophils %: 0.2 % (ref 0.0–3.0)
Basophils Absolute: 0 10*3/uL (ref 0.0–0.3)
Eosinophils %: 5.9 % (ref 0.0–7.0)
Eosinophils Absolute: 0.5 10*3/uL (ref 0.0–0.8)
Hematocrit: 27.8 % — ABNORMAL LOW (ref 36.0–48.0)
Hemoglobin: 8.8 gm/dL — ABNORMAL LOW (ref 12.0–16.0)
Lymphocytes Absolute: 1.4 10*3/uL (ref 0.6–5.1)
Lymphocytes: 16.4 % (ref 15.0–46.0)
MCH: 33 pg (ref 28–35)
MCHC: 32 gm/dL (ref 32–36)
MCV: 104 fL — ABNORMAL HIGH (ref 80–100)
MPV: 9.3 fL (ref 6.0–10.0)
Monocytes Absolute: 0.6 10*3/uL (ref 0.1–1.7)
Monocytes: 7.2 % (ref 3.0–15.0)
Neutrophils %: 70.3 % (ref 42.0–78.0)
Neutrophils Absolute: 5.8 10*3/uL (ref 1.7–8.6)
PLT CT: 241 10*3/uL (ref 130–440)
RBC: 2.67 10*6/uL — ABNORMAL LOW (ref 3.80–5.00)
RDW: 13.3 % (ref 11.0–14.0)
WBC: 8.2 10*3/uL (ref 4.0–11.0)

## 2022-01-11 LAB — VH CULTURE AND SMEAR, STERILE BODY FLUID

## 2022-01-11 LAB — MAGNESIUM: Magnesium: 1.9 mg/dL (ref 1.6–2.6)

## 2022-01-11 LAB — PHOSPHORUS: Phosphorus: 2.1 mg/dL — ABNORMAL LOW (ref 2.3–4.7)

## 2022-01-11 MED ORDER — VH HYDROMORPHONE HCL PF 1 MG/ML CARPUJECT
1.0000 mg | INTRAMUSCULAR | Status: DC | PRN
Start: 2022-01-11 — End: 2022-01-13
  Administered 2022-01-11 – 2022-01-12 (×10): 1 mg via INTRAVENOUS
  Filled 2022-01-11 (×10): qty 1

## 2022-01-11 MED ORDER — VH LIDOCAINE 4%-5% PATCH (WRAP)
2.0000 | MEDICATED_PATCH | Freq: Every day | CUTANEOUS | Status: DC
Start: 2022-01-11 — End: 2022-01-17
  Administered 2022-01-11 – 2022-01-17 (×7): 2 via TRANSDERMAL
  Filled 2022-01-11 (×7): qty 2

## 2022-01-11 MED ORDER — SODIUM CHLORIDE 0.9 % IV SOLN
INTRAVENOUS | Status: DC
Start: 2022-01-11 — End: 2022-01-12

## 2022-01-11 MED ORDER — SODIUM CHLORIDE 0.9 % IV SOLN
30.0000 mmol | Freq: Once | INTRAVENOUS | Status: AC
Start: 2022-01-11 — End: 2022-01-11
  Administered 2022-01-11: 30 mmol via INTRAVENOUS
  Filled 2022-01-11: qty 10

## 2022-01-11 MED ORDER — KETOROLAC TROMETHAMINE 15 MG/ML IJ SOLN
15.0000 mg | Freq: Four times a day (QID) | INTRAMUSCULAR | Status: AC
Start: 2022-01-11 — End: 2022-01-12
  Administered 2022-01-11 (×2): 15 mg via INTRAVENOUS
  Filled 2022-01-11 (×2): qty 1

## 2022-01-11 NOTE — Anesthesia Postprocedure Evaluation (Signed)
Anesthesia Post Evaluation    Patient: Nicole Sutton    Procedure(s):  EXPLORATORY LAPAROSCOPY , SMALL BOWEL RESECTION  EXPLORATORY LAPAROTOMY  LYSIS OF ADHESIONS    Anesthesia type: general    Last Vitals:   Vitals Value Taken Time   BP 121/56 01/10/22 1400   Temp 37.3 C (99.1 F) 01/10/22 1400   Pulse 98 01/10/22 1400   Resp 17 01/10/22 1400   SpO2 99 % 01/10/22 1400                 Anesthesia Post Evaluation:     Patient Evaluated: PACU  Patient Participation: complete - patient participated  Level of Consciousness: awake and alert  Pain Score: 0  Pain Management: adequate    Airway Patency: patent    Anesthetic complications: No      PONV Status: none    Cardiovascular status: acceptable  Respiratory status: acceptable  Hydration status: acceptable        Signed by: Malva Limes, DO, 01/11/2022 9:23 AM

## 2022-01-11 NOTE — Progress Notes (Signed)
NURSE NOTE SUMMARY  [facility] - 4TH SURGICAL   Patient Name: [facility]   Attending Physician: Larna Daughters, DO   Today's date:   01/11/2022 LOS: 2 days   Shift Summary:                                                              0730- Shift assessment completed. Patient alert and oriented x4. Bilateral radial and pedal pulses palpable. Patient reports full sensation to the BUE and numbness & tingling to the BLE that is her baseline. Bilateral lung sounds clear and diminished on RA. Bowel sounds hypoactive, patient denies passing flatus. 3 lap sites to the ABD OTA closed with staples. Midline ABD incision covered with mepilex. Foley intact with yellow/straw urine present. IVFs infusing per MAR. SCDs on. Bed alarm on.   0840- Patient reporting that when she lifted her left arm and it made her SOB and painful. Oxygen saturation of 96% on RA. Pain medication given. Provider notified.   1345- Patient declining walk at this time, per patient she might feel up to it later on.   EOS: Patient worked with PT/OT this shift. Patient pulling 750 on the IS, patient encouraged to use the IS 10 times per hour. Patient walked in hall this shift with CNA.    Provider Notifications:        Rapid Response Notifications:  Mobility:      PMP Activity: Step 3 - Bed Mobility (01/11/2022  7:30 AM)     Weight tracking:  Family Dynamic:   Last 3 Weights for the past 72 hrs (Last 3 readings):   Weight   01/09/22 1358 75.3 kg (166 lb 0.1 oz)             Last Bowel Movement   Last BM Date: 01/08/22 (Per patient)

## 2022-01-11 NOTE — Progress Notes (Signed)
DAILY PROGRESS NOTE - Trauma Acute Care Surgery (TACS)   Name:  Nicole Sutton, Nicole Sutton     DOB:  1950/09/20   MR#:  [id number]               ROOM: 447/447-A    DATE:  01/11/22      Principal Diagnosis:  SBO (small bowel obstruction)    Refer to below for diagnoses being addressed for this encounter    ASSESSMENT & PLAN:                                                               Hospital Day: 3    s/p laparoscopic converted to exploratory laparotomy with SBR and LOA 8/7 with Dr Nedra Hai   CONDITION:  stable    Patient Active Hospital Problem List:  SBO (small bowel obstruction) (12/23/2021)           Assessment: sp above procedure, NGT d/c d/t displacement        Plan: NPO with IVF, pain control and antiemetics, serial abdominal exams, monitor closely, pain medications changed today (Lidoderm patches added, Ketorlac added (check Cr.), and Dilaudid increased to 1mg )    Gastroesophageal reflux disease (12/24/2021)           Assessment: POA, chronic         Plan: IV famotidine        Hypophosphatemia       Assessment: 2.1       Plan: replaced    Doreene Burke, NP    X [phone]  [facility] Trauma Acute Care Surgery (TACS) Program  Phone [phone] or Pager #[phone]      AROBF, pain control  I agree with the history, exam, imaging and lab value interpretation, as well as the assessment and plan documented by Massie Kluver, NP in this note  Gabriel Cirri, MD [phone]  Phone [phone]    Incidental findings:  none  Additional Info:  Care plan and expectations reviewed with Patient  Questions were answered.  Discussed with bedside RN  Attempted to call daughter Thurston Hole) no answer, left message with direct call back number  Subjective/Chief Complaint & ROS:   Diffuse abdominal pain. No N/V. No flatus or BM.    24-hour interval history:  pain medications changed    Meds:   acetaminophen, 1,000 mg, Intravenous, 4 times per day  enoxaparin, 40 mg, Subcutaneous, Q24H  famotidine, 20 mg, Intravenous, Q12H SCH  ketorolac, 15 mg, Intravenous,  Q6H  lidocaine, 2 patch, Transdermal, Daily  piperacillin-tazobactam, 4.5 g, Intravenous, Q6H  sodium chloride (PF), 3 mL, Intravenous, Q12H SCH        Infusions:   lactated ringers 100 mL/hr at 01/11/22 0840        DVT Prophylaxis:  enoxaparin 40 mg SQ Daily  Medication VTE Prophylaxis Orders: enoxaparin (LOVENOX) syringe 40 mg  Mechanical VTE Prophylaxis Orders: Mechanical VTE: Pneumatic Compression; Knee high    Foley:  Yes          GI Prophylaxis:  Yes      BM last 24 Hours:  No   Bowel Regimen:  No  Diet:  Diet NPO effective now - Other (NG tube); Other- enter in Comments    I & O's:  Data reviewed Reviewed   UO 400  Pain: reasonably controlled  EXAM:   Vitals:  Blood pressure 121/57, pulse 72, temperature 98.2 F (36.8 C), temperature source Temporal, resp. rate 19, height 1.702 m (5\' 7" ), weight 75.3 kg (166 lb 0.1 oz), SpO2 96 %.   General:   in no apparent distress, appears comfortable, non-toxic, elderly, frail  Pulmonary:   lungs clear to ausculation, equal breath sounds, unlabored respirations, no accessory muscle use  Cardiac:   regular rate and rhythm without murmurs/rubs/gallops  Abdomen:   hypoactive bowel sounds, soft, moderate generalized tenderness without rebound and without guarding, non-distended  Neuro:   alert, no gross focal neurologic deficits, moves all extremities well  Psych:   normal affect, insight good  Extremities:   no edema and peripheral pulses 2+ and symmetric  Skin:   no jaundice, no rashes, normothermic    Lab Data Reviewed:  Yes-phos 2.1    Cultures:    Anaerobic cx - NGTD  Sterile body fluid cx/smear -  NGTD  Fungal cx - NGTD     Pathology: 1. Mesenteric band, excision:          Benign fibroadipose tissue with fibrosis and mild acute and chronic              inflammation.     2. Mid jejunum, segmental resection:          Benign villiform small bowel with acute serositis and foreign body   reaction,              consistent with suture granuloma.     CHEM:     Recent Labs    Lab 01/11/22  0936 01/10/22  0524 01/09/22  1416   Glucose 88 145* 130*   Sodium 137 136 139   Potassium 3.7 4.5 4.0   Chloride 107 107 104   CO2 24 19* 25   BUN 18 20 24*   Creatinine 0.83 0.82 1.03   Calcium 7.9* 8.5 9.9   Magnesium 1.9 1.8  --    Phosphorus 2.1* 2.8  --        CBC:       Recent Labs   Lab 01/11/22  0936 01/10/22  0524 01/09/22  1416   WBC 8.2 12.4* 6.3   Hemoglobin 8.8* 10.8* 11.3*   Hematocrit 27.8* 35.1* 35.3*   PLT CT 241 309 343   MCV 104* 105* 102*       BANDS:      POCT:      LFTs:      Recent Labs   Lab 01/10/22  0524 01/09/22  1416   ALT 19 19   AST (SGOT) 30 25   Alkaline Phosphatase 47 54   Bilirubin, Total 0.6 0.5   Bilirubin Direct 0.2  --    Albumin 3.3* 3.9       COAG:      Lactate:    Recent Labs   Lab 01/10/22  0543   Lactic Acid 0.8         Radiology:    No results found.
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
## 2022-01-11 NOTE — Progress Notes (Signed)
Readmission Risk  [facility] - 4TH SURGICAL   Patient Name: Nicole Sutton, Nicole Sutton   Attending Physician: Rea College, MD   Today's date:   01/11/2022 LOS: 2 days   Expected Discharge Date      Readmission Assessment:                                                              Discharge Planning  ReAdmit Risk Score: 14.21  Does the patient have perscription coverage?: Yes  CM Comments: 01/11/22 LPN DCP ([place]) 71 y.o. female admitted with small bowel obstruction. IVF. IVA. Foley. PT/OT recommending inpatient rehab. Patient lives in a two story home by herself. States bed, bath and laundry are on second floor. Identifies children, brother and friends as her support system. CM will continue to follow.       IDPA:   Patient Type  Within 30 Days of Previous Admission?: Yes  Healthcare Decisions  Interviewed:: Patient  Orientation/Decision Making Abilities of Patient: Alert and Oriented x3, able to make decisions  Advance Directive: Patient does not have advance directive  Healthcare Agent Appointed: No  Prior to admission  Prior level of function: Independent with ADLs, Ambulates independently  Type of Residence: Private residence  Home Layout: Two level, Bed/bath upstairs  Have running water, electricity, heat, etc?: Yes  Living Arrangements: Alone  How do you get to your MD appointments?: Self  How do you get your groceries?: Self  Who fixes your meals?: Self  Who does your laundry?: Self  Who picks up your prescriptions?: Self  Dressing: Independent  Grooming: Independent  Feeding: Independent  Bathing: Independent  Toileting: Independent  DME Currently at Home: [id number], [place], [place], [facility]  Discharge Planning  Support Systems: Children, Family members, Friends/neighbors  Patient expects to be discharged to:: Home  Anticipated  plan discussed with:: Same as interviewed  Mode of transportation:: Private car (family member)  Does the patient have perscription coverage?:  Yes  Consults/Providers  PT Evaluation Needed: No  OT Evalulation Needed: No  SLP Evaluation Needed: No  Correct PCP listed in Epic?: Yes  Family and PCP  PCP on file was verified as the current PCP?: Yes   30 Day Readmission:       Provider Notifications:      Luna Glasgow LPN DCP  Discharge Planner 4th/2nd  Ext: [phone]

## 2022-01-11 NOTE — Plan of Care (Signed)
NURSE NOTE SUMMARY  [facility] - 4TH SURGICAL   Patient Name: [facility]   Attending Physician: Larna Daughters, DO   Today's date:   01/11/2022 LOS: 2 days   Shift Summary:                                                              1930 assessment completed. IVF infusing well. IV tylenol given. Midline dressing to abdomen dry and intact, 3 lap sites with staples, foley to gravity draining clear yellow urine. SCD'S on and encouraged use of IS.  2000 due meds given  2244 dilaudid given for c/o pain 10/10  0044 resting well pain down to 5/10  0200 tylenol given iv foley care done      Provider Notifications:        Rapid Response Notifications:  Mobility:      PMP Activity: Step 3 - Bed Mobility (01/10/2022  8:00 PM)     Weight tracking:  Family Dynamic:   Last 3 Weights for the past 72 hrs (Last 3 readings):   Weight   01/09/22 1358 75.3 kg (166 lb 0.1 oz)             Last Bowel Movement   Last BM Date: 01/08/22       Problem: Compromised Tissue integrity  Goal: Damaged tissue is healing and protected  Description: Interventions:  1. Monitor/assess Braden scale every shift  2. Provide wound care per wound care algorithm  3. Reposition patient every 2 hours and as needed unless able to reposition self  4. Increase activity as tolerated/progressive mobility  5. Relieve pressure to bony prominences for patients at moderate and high risk  6. Avoid shearing injuries   7. Keep intact skin clean and dry  8. Use bath wipes, not soap and water, for daily bathing   9. Use incontinence wipes for cleaning urine, stool and caustic drainage; Foley care as needed   10. Monitor external devices/tubes for correct placement to prevent pressure, friction and shearing   11. Encourage use of lotion/moisturizer on skin  12. Monitor patient's hygiene practices  13. Consult/collaborate with wound care nurse   14. Utilize specialty bed  15. Consider placing an indwelling catheter if incontinence interferes with  healing of stage 3 or 4 pressure injury  Outcome: Progressing  Flowsheets (Taken 01/11/2022 0228)  Damaged tissue is healing and protected:   Monitor/assess Braden scale every shift   Keep intact skin clean and dry     Problem: Compromised Tissue integrity  Goal: Damaged tissue is healing and protected  Description: Interventions:  1. Monitor/assess Braden scale every shift  2. Provide wound care per wound care algorithm  3. Reposition patient every 2 hours and as needed unless able to reposition self  4. Increase activity as tolerated/progressive mobility  5. Relieve pressure to bony prominences for patients at moderate and high risk  6. Avoid shearing injuries   7. Keep intact skin clean and dry  8. Use bath wipes, not soap and water, for daily bathing   9. Use incontinence wipes for cleaning urine, stool and caustic drainage; Foley care as needed   10. Monitor external devices/tubes for correct placement to prevent pressure, friction and shearing   11. Encourage use of lotion/moisturizer on  skin  12. Monitor patient's hygiene practices  13. Consult/collaborate with wound care nurse   14. Utilize specialty bed  15. Consider placing an indwelling catheter if incontinence interferes with healing of stage 3 or 4 pressure injury  Outcome: Progressing  Flowsheets (Taken 01/11/2022 0228)  Damaged tissue is healing and protected:   Monitor/assess Braden scale every shift   Keep intact skin clean and dry     Problem: Pain interferes with ability to perform ADL  Goal: Pain at adequate level as identified by patient  Description: Interventions:  1. Identify patient comfort function goal  2. Evaluate if patient comfort function goal is met  3. Assess pain on admission, during daily assessment and/or before any "as needed" intervention(s)  4. Reassess pain within 30-60 minutes of any procedure/intervention, per Pain Assessment, Intervention, Reassessment (AIR) Cycle  5. Evaluate patient's satisfaction with pain management  progress  6. Offer non-pharmacological pain management interventions  7. Consult/collaborate with Pain Service  8. Consult/collaborate with Physical Therapy, Occupational Therapy, and/or Speech Therapy  9. Assess for risk of opioid induced respiratory depression and side effects, including snoring/sleep apnea. Alert healthcare team of risk factors identified.  10. Include patient/patient care companion in decisions related to pain management as needed  Outcome: Progressing  Flowsheets (Taken 01/11/2022 0228)  Pain at adequate level as identified by patient:   Identify patient comfort function goal   Assess for risk of opioid induced respiratory depression, including snoring/sleep apnea. Alert healthcare team of risk factors identified.   Assess pain on admission, during daily assessment and/or before any "as needed" intervention(s)   Reassess pain within 30-60 minutes of any procedure/intervention, per Pain Assessment, Intervention, Reassessment (AIR) Cycle   Evaluate if patient comfort function goal is met   Evaluate patient's satisfaction with pain management progress   Offer non-pharmacological pain management interventions     Problem: Pain interferes with ability to perform ADL  Goal: Pain at adequate level as identified by patient  Description: Interventions:  1. Identify patient comfort function goal  2. Evaluate if patient comfort function goal is met  3. Assess pain on admission, during daily assessment and/or before any "as needed" intervention(s)  4. Reassess pain within 30-60 minutes of any procedure/intervention, per Pain Assessment, Intervention, Reassessment (AIR) Cycle  5. Evaluate patient's satisfaction with pain management progress  6. Offer non-pharmacological pain management interventions  7. Consult/collaborate with Pain Service  8. Consult/collaborate with Physical Therapy, Occupational Therapy, and/or Speech Therapy  9. Assess for risk of opioid induced respiratory depression and side effects,  including snoring/sleep apnea. Alert healthcare team of risk factors identified.  10. Include patient/patient care companion in decisions related to pain management as needed  Outcome: Progressing  Flowsheets (Taken 01/11/2022 0228)  Pain at adequate level as identified by patient:   Identify patient comfort function goal   Assess for risk of opioid induced respiratory depression, including snoring/sleep apnea. Alert healthcare team of risk factors identified.   Assess pain on admission, during daily assessment and/or before any "as needed" intervention(s)   Reassess pain within 30-60 minutes of any procedure/intervention, per Pain Assessment, Intervention, Reassessment (AIR) Cycle   Evaluate if patient comfort function goal is met   Evaluate patient's satisfaction with pain management progress   Offer non-pharmacological pain management interventions     Problem: Altered GI Function  Goal: Fluid and electrolyte balance are achieved/maintained  Description: Interventions:  1. Monitor intake and output every shift  2. Monitor/assess lab values and report abnormal values  3. Provide adequate hydration  4. Assess for confusion/personality changes  5. Monitor daily weight  6. Assess and reassess fluid and electrolyte status  7. Observe for seizure activity and initiate seizure precautions if indicated  8. Observe for cardiac arrhythmias  9. Monitor for muscle weakness  Outcome: Progressing  Flowsheets (Taken 01/11/2022 0228)  Fluid and electrolyte balance are achieved/maintained:   Monitor intake and output every shift   Monitor/assess lab values and report abnormal values   Provide adequate hydration   Monitor daily weight   Assess and reassess fluid and electrolyte status     Problem: Altered GI Function  Goal: Fluid and electrolyte balance are achieved/maintained  Description: Interventions:  1. Monitor intake and output every shift  2. Monitor/assess lab values and report abnormal values  3. Provide adequate  hydration  4. Assess for confusion/personality changes  5. Monitor daily weight  6. Assess and reassess fluid and electrolyte status  7. Observe for seizure activity and initiate seizure precautions if indicated  8. Observe for cardiac arrhythmias  9. Monitor for muscle weakness  Outcome: Progressing  Flowsheets (Taken 01/11/2022 0228)  Fluid and electrolyte balance are achieved/maintained:   Monitor intake and output every shift   Monitor/assess lab values and report abnormal values   Provide adequate hydration   Monitor daily weight   Assess and reassess fluid and electrolyte status
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
## 2022-01-12 ENCOUNTER — Ambulatory Visit (INDEPENDENT_AMBULATORY_CARE_PROVIDER_SITE_OTHER): Payer: Medicare PPO | Admitting: Family

## 2022-01-12 ENCOUNTER — Encounter: Payer: Self-pay | Admitting: Surgery

## 2022-01-12 LAB — CBC AND DIFFERENTIAL
Basophils %: 0.6 % (ref 0.0–3.0)
Basophils Absolute: 0 10*3/uL (ref 0.0–0.3)
Eosinophils %: 9.5 % — ABNORMAL HIGH (ref 0.0–7.0)
Eosinophils Absolute: 0.6 10*3/uL (ref 0.0–0.8)
Hematocrit: 27.2 % — ABNORMAL LOW (ref 36.0–48.0)
Hemoglobin: 8.6 gm/dL — ABNORMAL LOW (ref 12.0–16.0)
Lymphocytes Absolute: 1.4 10*3/uL (ref 0.6–5.1)
Lymphocytes: 21.4 % (ref 15.0–46.0)
MCH: 33 pg (ref 28–35)
MCHC: 32 gm/dL (ref 32–36)
MCV: 104 fL — ABNORMAL HIGH (ref 80–100)
MPV: 9.8 fL (ref 6.0–10.0)
Monocytes Absolute: 0.5 10*3/uL (ref 0.1–1.7)
Monocytes: 8 % (ref 3.0–15.0)
Neutrophils %: 60.5 % (ref 42.0–78.0)
Neutrophils Absolute: 3.9 10*3/uL (ref 1.7–8.6)
PLT CT: 242 10*3/uL (ref 130–440)
RBC: 2.61 10*6/uL — ABNORMAL LOW (ref 3.80–5.00)
RDW: 13.2 % (ref 11.0–14.0)
WBC: 6.5 10*3/uL (ref 4.0–11.0)

## 2022-01-12 LAB — BASIC METABOLIC PANEL
Anion Gap: 9.8 mMol/L (ref 7.0–18.0)
BUN / Creatinine Ratio: 25 Ratio (ref 10.0–30.0)
BUN: 21 mg/dL (ref 7–22)
CO2: 23 mMol/L (ref 20–30)
Calcium: 8.1 mg/dL — ABNORMAL LOW (ref 8.5–10.5)
Chloride: 109 mMol/L (ref 98–110)
Creatinine: 0.84 mg/dL (ref 0.60–1.20)
EGFR: 74 mL/min/{1.73_m2} (ref 60–150)
Glucose: 65 mg/dL — ABNORMAL LOW (ref 71–99)
Osmolality Calculated: 277 mOsm/kg (ref 275–300)
Potassium: 3.8 mMol/L (ref 3.5–5.3)
Sodium: 138 mMol/L (ref 136–147)

## 2022-01-12 LAB — MAGNESIUM: Magnesium: 1.9 mg/dL (ref 1.6–2.6)

## 2022-01-12 LAB — PHOSPHORUS: Phosphorus: 3.1 mg/dL (ref 2.3–4.7)

## 2022-01-12 MED ORDER — ACETAMINOPHEN 10 MG/ML IV SOLN
1000.0000 mg | Freq: Four times a day (QID) | INTRAVENOUS | Status: AC
Start: 2022-01-12 — End: 2022-01-14
  Administered 2022-01-12 – 2022-01-14 (×8): 1000 mg via INTRAVENOUS
  Filled 2022-01-12 (×8): qty 100

## 2022-01-12 MED ORDER — KCL IN DEXTROSE-NACL 20-5-0.45 MEQ/L-%-% IV SOLN
INTRAVENOUS | Status: DC
Start: 2022-01-12 — End: 2022-01-16
  Filled 2022-01-12 (×6): qty 1000

## 2022-01-12 MED ORDER — OXYCODONE HCL 5 MG PO TABS
5.0000 mg | ORAL_TABLET | ORAL | Status: DC | PRN
Start: 2022-01-12 — End: 2022-01-13

## 2022-01-12 NOTE — OT Progress Note (Signed)
[facility]:  [id number] [facility]   Patient: Nicole Sutton     CSN: [id number]    Bed: 447/447-A  Occupational Therapy PROGRESS note      Visit#: 2  Treatment Frequency: OT Frequency Recommended: 3-4x/wk    DISCHARGE RECOMMENDATIONS:   DME recommended for Discharge:   Sock aid  Long handled shoe horn    Discharge Recommendations:   Home with supervision         Additional Recommendations or Precautions   Precautions, Contraindications, Awareness details:   Abdominal Precautions  Falls  Mobility protocol      Recommend patient to be out of bed for all meals and toileting needs using FWW up to BSC/Chair with Ax1. Participate in own self-care as able outside of and in addition to OT session.     OT Assessment and Plan of Care:      HPI (per physician charting) and Pertinent Medical Details:  Nicole Sutton is a 71 y.o. female admitted 01/09/2022 presenting with increased abdominal pain n/v. Dx small bowel obstruction. Pt s/p laparoscopic to exploratory laparotomy with small bowel resection and lysis of adhesions on 8/7.      PMHx: multiple abdominal surgeries    OT Assessment:  Patient making significant functional progress from evaluation, meeting 4 short term goal(s) this session. Patient continues to be limited by decreased functional mobility and activity tolerance from baseline, however able to demonstrate implementation of adaptive techniques to improve independence and safety this date with ADL completion. Focus of session on dressing adaptive techniques and use AE to improve independence. Patient able to return understanding of abdominal precautions with minimal cues for thoroughness and adapt routine to complete ADLs with supervision. Patient reports increased family support available and so d/c recommendation changed to home with supervision. Frequency decreased due to patient progression. Continue OT POC.       Goals:  STG: in 4-9 visits   Pt will donn/doff socks Min  (A) and use of AE/AT prn following precautions - Met 8/10  Pt will complete pants/clothing management Mod (A) and use of AE/AT prn maintaining precautions - Met 8/10  Pt will complete UB dressing (S) and use of AE/AT prn - ongoing   Pt will perform BSC/chair transfers (S) with use of AD/DME prn - Met 8/10  Pt will complete toileting skills Min (A) with use of AD/DME prn -  ongoing  Pt will perform 2-3 grooming tasks standing at sink with (S) and use of AD/DME prn - ongoing  Pt will return good understanding of abdominal precautions with 100% carryover - ongoing  Pt will return application of LB AE for ADL skills while maintaining precautions with (S) - Met 8/10     LTG (in 10 to 15 visits):   1. Patient will be Supervision for ADLs, Supervision for functional transfers/mobility with use of AD/DME/AT/AE. - ongoing      Patient will continue to benefit from skilled OT services in order to increase independence and safety with ADL/IADLs, reduce risk of falls, reduce risk of rehospitalization.    Treatment/interventions: Continue plan of care     Subjective:   "I use my cane as a reach most of the time"   Patient is agreeable to participation in the therapy session. Nursing clears patient for therapy.    Pain:  At Rest: 3 /10  With Activity: 3/10  Location: Abdomen  Interventions: Repositioned         Vital Signs:   Stable with no signs/symptoms  of distress     Oriented to: Oriented x4  Command following: Follows ALL commands and directions without difficulty  Memory: Appears intact  Safety Awareness: Independent    Behavior: Cooperative    Activities of Daily Living:   BATHING: Supervision/Set up;  Simulated performed seated in a chair with  long handled sponge   LB Dressing: Supervision/Set up; thread RLE into underwear, thread LLE into underwear, pull up over hips, don/doff R sock, don/doff L sock, sock-aid, reacher performed  seated in a chair patient also able to obtain/maintain figure 4 positioning in sitting to  address distal BLEs to complete dressing without use of AE.     Training this date for LB AE application including reacher, sock aid, long handled sponge, and long handled shoe horn for application to improve independence and safety with LB ADL completion. OT provided visual and verbal demonstration/education for techniques to donn/doff socks, undergarments, pants and shoes. Patient completed teach back with minimal cues for skill carryover and correct execution to ensure best practice with noted improved independence. Education provided for how to acquire AE through multiple resources. Patient returns good understanding. Will address skill carryover.    Functional Mobility:   Bed Mobility:  Not tested due to patient already OOB.    Transfers:  Chair: Supervision with Front wheeled walker.        Toilet: Supervision with Front wheeled walker, [employer] left.   Cues for Sequencing, dry transfer  Functional mobility/ambulation: Supervision with Front wheeled walker.      Other Treatment Interventions:   Treatment Activities:   Compensation technique training  Energy conservation  Adaptive techniques  DME/AE application/use  Fall prevention  Abdominal precautions     Education Provided:   Topics: Role of occupational therapy, plan of care, goals of therapy and safety with mobility and ADLs, benefits of activity, abdominal precautions, home safety, use of adaptive equipment, bed mobility with use of adaptive equipment or strategy, activity with nursing, normal recovery process in regards to activity and ADLs after discharge from hospital .    Individuals educated: Patient.  Method: Explanation, Demonstration, and Handout on abdominal precautions with ADL given to patient.  Response to education: Verbalized understanding and Demonstrated understanding.    Team Communication:   Spoke to: RN/LPN - Leana  Regarding: Pre-session re: patient status, Patient position at end of session, Discharge needs, Patient participation  with Therapy, Progressive Mobility Program recommendations  OT/COTA communication: via written note and verbal communication as needed.    Patient Position at End of Treatment:   Sitting, in a chair, in the room, Needs in reach, and No distress    Time of treatment:   Time Calculation  OT Received On: 01/12/22  Start Time: 1452  Stop Time: 1511  Time Calculation (min): 19 min    Campbell Lerner, OTR/L    OT Progress Note

## 2022-01-12 NOTE — Progress Notes (Signed)
DAILY PROGRESS NOTE - Trauma Acute Care Surgery (TACS)   Name:  Nicole Sutton, Nicole Sutton     DOB:  1951/02/06   MR#:  [id number]               ROOM: 447/447-A    DATE:  01/12/22      Principal Diagnosis:  SBO (small bowel obstruction)    Refer to below for diagnoses being addressed for this encounter    ASSESSMENT & PLAN:                                                               Hospital Day: 4    s/p laparoscopic converted to exploratory laparotomy with SBR and LOA 8/7 with Dr Nedra Hai   CONDITION:  stable    Patient Active Hospital Problem List:  SBO (small bowel obstruction) (12/23/2021)           Assessment: sp above procedure, pain improved, no flatus or N/V        Plan: NPO with IVF, pain control and antiemetics, serial abdominal exams, monitor closely, pain medications     Gastroesophageal reflux disease (12/24/2021)           Assessment: POA, chronic         Plan: IV famotidine        Hypophosphatemia       Assessment: resolved        Plan: replaced prn, monitor     Wynetta Emery, [place]    X [phone]  [facility] Trauma Acute Care Surgery [facility]) Program  Phone [phone] or Pager #[phone]    AROBF  I agree with the history, exam, imaging and lab value interpretation, as well as the assessment and plan documented by Hayden Pedro, PA in this note  Gabriel Cirri, MD [phone]  Phone [phone]      Incidental findings:  none  Additional Info:  Care plan and expectations reviewed with Patient  Questions were answered.  Discussed with bedside RN  Attempted to call daughter Thurston Hole) no answer, left message with direct call back number  Subjective/Chief Complaint & ROS:   Diffuse abdominal pain. No N/V. No flatus or BM.    24-hour interval history:  ARBF     Meds:   acetaminophen, 1,000 mg, Intravenous, 4 times per day  enoxaparin, 40 mg, Subcutaneous, Q24H  famotidine, 20 mg, Intravenous, Q12H SCH  lidocaine, 2 patch, Transdermal, Daily  piperacillin-tazobactam, 4.5 g, Intravenous, Q6H  sodium chloride (PF), 3 mL, Intravenous, Q12H SCH         Infusions:   dextrose 5 % and 0.45 % NaCl with KCl 20 mEq          DVT Prophylaxis:  enoxaparin 40 mg SQ Daily  Medication VTE Prophylaxis Orders: enoxaparin (LOVENOX) syringe 40 mg  Mechanical VTE Prophylaxis Orders: Mechanical VTE: Pneumatic Compression; Knee high    Foley:  Yes          GI Prophylaxis:  Yes      BM last 24 Hours:  No   Bowel Regimen:  No  Diet:  Diet NPO effective now - Other (NG tube); Other- enter in Comments    I & O's:  Data reviewed Reviewed   UO 600  Pain: reasonably controlled  EXAM:   Vitals:  Blood pressure 121/63, pulse 79, temperature 97 F (36.1 C), temperature source Temporal, resp. rate 18, height 1.702 m (5\' 7" ), weight 75.3 kg (166 lb 0.1 oz), SpO2 96 %.   General:   in no apparent distress, appears comfortable, non-toxic, elderly, frail  Pulmonary:   lungs clear to ausculation, equal breath sounds, unlabored respirations, no accessory muscle use  Cardiac:   regular rate and rhythm without murmurs/rubs/gallops  Abdomen:   hypoactive bowel sounds, soft, moderate generalized tenderness without rebound and without guarding, non-distended  Neuro:   alert, no gross focal neurologic deficits, moves all extremities well  Psych:   normal affect, insight good  Extremities:   no edema and peripheral pulses 2+ and symmetric  Skin:   no jaundice, no rashes, normothermic    Lab Data Reviewed:  Yes    Cultures:    Anaerobic cx - NGTD  Sterile body fluid cx/smear -  NGTD  Fungal cx - NGTD     Pathology:   1. Mesenteric band, excision:          Benign fibroadipose tissue with fibrosis and mild acute and chronic inflammation.   2. Mid jejunum, segmental resection:          Benign villiform small bowel with acute serositis and foreign body   reaction, consistent with suture granuloma.     CHEM:     Recent Labs   Lab 01/12/22  0420 01/11/22  0936 01/10/22  0524 01/09/22  1416   Glucose 65* 88 145* 130*   Sodium 138 137 136 139   Potassium 3.8 3.7 4.5 4.0   Chloride 109 107 107 104   CO2 23 24 19*  25   BUN 21 18 20  24*   Creatinine 0.84 0.83 0.82 1.03   Calcium 8.1* 7.9* 8.5 9.9   Magnesium 1.9 1.9 1.8  --    Phosphorus 3.1 2.1* 2.8  --        CBC:       Recent Labs   Lab 01/12/22  0420 01/11/22  0936 01/10/22  0524 01/09/22  1416   WBC 6.5 8.2 12.4* 6.3   Hemoglobin 8.6* 8.8* 10.8* 11.3*   Hematocrit 27.2* 27.8* 35.1* 35.3*   PLT CT 242 241 309 343   MCV 104* 104* 105* 102*       BANDS:      POCT:      LFTs:      Recent Labs   Lab 01/10/22  0524 01/09/22  1416   ALT 19 19   AST (SGOT) 30 25   Alkaline Phosphatase 47 54   Bilirubin, Total 0.6 0.5   Bilirubin Direct 0.2  --    Albumin 3.3* 3.9       COAG:      Lactate:    Recent Labs   Lab 01/10/22  0543   Lactic Acid 0.8         Radiology:    No results found.

## 2022-01-12 NOTE — Progress Notes (Signed)
MOBILITY NOTE:     Precautions: Weight Bearing: Full weight bearing     Activity:  Ambulated in the hall x320 feet     Level of Assistance: A little     Device(s) used: Wheeled walker     Positioning frequency: Turns self as needed    Last Vitals: BP 121/63   Pulse 79   Temp 97 F (36.1 C) (Temporal)   Resp 18   Ht 1.702 m (5\' 7" )   Wt 75.3 kg (166 lb 0.1 oz)   SpO2 96%   BMI 26.00 kg/m       Pt sitting up in recliner, ready to go for a walk. C/o slight abd pain, no nausea or sob. Offered cues to take it slow and steady. Pt back in room, sitting up in recliner. Pt back in bed, bed side table and call bell within reach.

## 2022-01-12 NOTE — UM Notes (Signed)
[facility] Utilization Management Review       Facility :  [facility]  ROOM: 447/447-A    NAME: Nicole Sutton  Date of Birth: 08-11-1950  MRN#: [id number]  CSN#: [id number]    ADMIT DATE AND TIME: 01/09/2022  2:59 PM    CONTINUED STAY REVIEW 01/11/2022    Pertinent Updates:     SBO (small bowel obstruction) (12/23/2021)           Assessment: sp above procedure, NGT d/c d/t displacement        Plan: NPO with IVF, pain control and antiemetics, serial abdominal exams, monitor closely, pain medications changed today (Lidoderm patches added, Ketorlac added (check Cr.), and Dilaudid increased to 1mg )    Hypophosphatemia       Assessment: 2.1       Plan: replaced    AROBF, pain control      Vitals: Blood pressure 121/57, pulse 72, temperature 98.2 F (36.8 C), temperature source Temporal, resp. rate 19, height 1.702 m (5\' 7" ), weight 75.3 kg (166 lb 0.1 oz), SpO2 96 %.     Abnl/Pertinent Labs/Radiology/Diagnostic Studies:         Pertinent Physical Exam Findings: Abdomen:   hypoactive bowel sounds, soft, moderate generalized tenderness without rebound and without guarding, non-distended    Pertinent Medications:    24-hour interval history:  pain medications changed     Meds:   acetaminophen, 1,000 mg, Intravenous, 4 times per day  enoxaparin, 40 mg, Subcutaneous, Q24H  famotidine, 20 mg, Intravenous, Q12H SCH  ketorolac, 15 mg, Intravenous, Q6H  lidocaine, 2 patch, Transdermal, Daily  piperacillin-tazobactam, 4.5 g, Intravenous, Q6H  sodium chloride (PF), 3 mL, Intravenous, Q12H SCH         Infusions:   lactated ringers 100 mL/hr at 01/11/22 0840      IV DILAUDID 0.5 MG PRN Q2H 7X'S     Orders:   NPO  02 @ 2L NC PRN  IS  AM LABS  STRICT I/O  AMB IN HALL   TACS PT  PT/OT  SCD'S       PT/OT/SLP/CM Assessments or Notes:   Discharge Planning  ReAdmit Risk Score: 14.21  Does the patient have perscription coverage?: Yes  CM Comments: 01/11/22 LPN DCP (AR) 71 y.o. female admitted with small bowel obstruction. IVF. IVA.  Foley. PT/OT recommending inpatient rehab. Patient lives in a two story home by herself. States bed, bath and laundry are on second floor. Identifies children, brother and friends as her support system. CM will continue to follow.    PT:  Recommend patient  ambulate 2-3 times/day with Wheeled walker and physical assist and/or supervision of 1 staff, transfer to chair/BSC 2-3 times/day with Wheeled walker and physical assist and/or supervision of 1 staff as tolerated.       OT:  Recommend patient to be out of bed for all meals and toileting needs using FWW up to BSC/Chair with Ax1. Participate in own self-care as able outside of and in addition to OT session.       03/13/22, RN, BSN    UTILIZATION REVIEW SPECIALIST   Direct ph.: [phone]  UR Dept. #: [phone]  UR fax #:  [phone]    E mail address: [name]

## 2022-01-12 NOTE — Plan of Care (Addendum)
NURSE NOTE SUMMARY  [facility] - 4TH SURGICAL   Patient Name: Nicole Sutton, Nicole Sutton   Attending Physician: Rea College, MD   Today's date:   01/12/2022 LOS: 3 days   Shift Summary:                                                                Pt uses IS to 500 initially and increased to 550. She continues to work on IS increasing volume. She uses IS correctly.   BS hypoactive X 4.   Pain well controlled with IV dilaudid and scheduled iv acetaminophen.   1422 urinary catheter removed at this time.   Urinary catheter protocol in place.     IV remains patent. Pt ambulates in hallway with staff, gait even and steady.      Provider Notifications:        Rapid Response Notifications:  Mobility:      PMP Activity: Step 5 - Chair (01/11/2022 11:00 PM)     Weight tracking:  Family Dynamic:   Last 3 Weights for the past 72 hrs (Last 3 readings):   Weight   01/09/22 1358 75.3 kg (166 lb 0.1 oz)             Last Bowel Movement   Last BM Date: 01/08/22        Problem: Pain interferes with ability to perform ADL  Goal: Pain at adequate level as identified by patient  Outcome: Progressing     Problem: Altered GI Function  Goal: Fluid and electrolyte balance are achieved/maintained  Outcome: Progressing     Problem: Moderate/High Fall Risk Score >5  Goal: Patient will remain free of falls  Outcome: Progressing

## 2022-01-12 NOTE — PT Progress Note (Signed)
[facility]: [facility]  Patient: Nicole Sutton     CSN: [id number]    Bed: 447/447-A  Physical Therapy PROGRESS note   Visit#: 2   Treatment Frequency: 4-5x/wk  Last seen by a physical therapist vs. Physical therapist assistant: 01/12/2022      DISCHARGE RECOMMENDATIONS   Discharge Recommendations:   Home with supervision;Home with home health PT   -patient stating if needed can stay at sister's home     DME recommended for Discharge:   Has needed equipment    PMP (Progressive Mobility Program) Recommendations:   Recommend patient  ambulate 2-3 times/day with Wheeled walker and physical assist and/or supervision of 1 staff, transfer to chair/BSC 2-3 times/day with Wheeled walker and physical assist and/or supervision of 1 staff as tolerated.     Precautions and Contraindications:   Abdominal Precautions  Falls  Mobility protocol     PT Assessment and Plan of Care (Treatment frequency noted above):   HPI (per physician charting) and Pertinent Medical Details:    Admitted 01/09/2022 with/for small bowel obstruction. Pt s/p laparoscopic to exploratory laparotomy with small bowel resection and lysis of adhesions on 8/7.      PMHx: multiple abdominal surgeries     Goals:    Long Term Goals:  Patient will achieve the following goals by 4-10 visits  Patient will be able to perform bed mobility with modified independence assistance in order to prepare for out of bed activities. ONGOING  Patient will be able to perform sit to stand with modified independence  assistance using the least restrictive device in order to prepare for ambulation. ONGOING  Patient will be able to perform 100 feet ambulation with modified independence assistance using the least restrictive device in order to prepare for household ambulation. ONGOING  Patient will be able to ascend and descend full flight of stairs with 2 handrail and supervision assistance in order to be able to enter/exit home.  ONGOING    PT Assessment:  Patient's progress towards established goals: Patient with improved pain control which assists to progress mobility. Pt ambulates 100 feet with supervision using a FWW. Pt defers further mobility due to fatigue. Pt requires assistance to recall all abdominal precautions and educated on upcoming PT sessions to address further with bed mobility. Patient informing PT that currently believes she is functioning at safe level to return home independently, however discussed with patient benefits of discharging home to sister's for a few days. Updating recommendation to home with supervision and home health PT.      Treatment plan: Continue plan of care     Subjective:   " I used to walk 5,000 steps a day  "  Patient/family/caregiver consent to therapy session is noted by the participation in the therapy session.    Pain:  At Rest: 3 /10  With Activity: 3/10  Location: Abdomen  Interventions: None required    OBJECTIVE:   Observation of Patient/Vital Signs:   Patient's medical condition is appropriate for Physical therapy intervention at this time.    Vital Signs:  Stable with no signs/symptoms of distress           Oriented to: Oriented x4  Command following: Follows ALL commands and directions without difficulty  Memory: Decreased recall of precautions    Musculoskeletal and Balance Details:   Balance:        [employer] Balance Scale  2/5    Patient supports self independently with both upper extremities. (i.e. walker,  crutches).        Bed Mobility:   Not tested due to patient already OOB.    Transfers:  Sit to Stand:  Supervision with Front wheeled walker.    Cues for Sequencing  Stand to Sit:  Supervision.    Cues for Sequencing    Locomotion:  LEVEL AMBULATION:  Distance: 100'   Assistance level:  Supervision  Device:  Front wheeled walker  Pattern:  Reciprocal, Decreased cadence  -limited by: patient fatigued and wanting to prevent onset of pain deferring further  ambulation      Other Treatment Interventions this session:   Therapeutic activity  Patient/family/caregiver education  Equipment evaluation/education     Education Provided:   TOPICS: role of physical therapy, plan of care, goals of therapy and HEP, safety with mobility and ADLs, benefits of activity, abdominal precautions, home safety, use of adaptive equipment, bed mobility with use of adaptive equipment or strategy, activity with nursing    Learner educated: Patient  Method: Explanation  Response to education: Verbalized understanding    Patient Position at End of Treatment:   Sitting, in a reclined position in chair, in the room, Needs in reach, and No distress    Team Communication:   Spoke to : RN/LPN - Leana  Regarding: Pre-session re: patient status, Patient position at end of session, Discharge needs, Patient participation with Therapy, Vital signs, Progressive Mobility Program recommendations  PT/PTA communication: via written note and verbal communication as needed.    Time of treatment:   Time Calculation  PT Received On: 01/12/22  Start Time: 1147  Stop Time: 1202  Time Calculation (min): 15 min    Jaci Lazier, PT, DPT

## 2022-01-12 NOTE — Plan of Care (Signed)
NURSE NOTE SUMMARY  [facility] - 4TH SURGICAL   Patient Name: Nicole Sutton, Nicole Sutton   Attending Physician: Rea College, MD   Today's date:   01/12/2022 LOS: 3 days   Shift Summary:                                                              Patient sitting up in the chair during initial rounds with family at bedside.   IVF infusing well. Assessment completed. Due meds given. Pain medicine given per MAR . Refused toradol because of her kidney. Foley to gravity. Call bell within reached. Continue with POC. No acute distress noted.   Provider Notifications:        Rapid Response Notifications:  Mobility:      PMP Activity: Step 5 - Chair (01/11/2022 11:00 PM)     Weight tracking:  Family Dynamic:   Last 3 Weights for the past 72 hrs (Last 3 readings):   Weight   01/09/22 1358 75.3 kg (166 lb 0.1 oz)             Last Bowel Movement   Last BM Date: 01/08/22       Problem: Compromised Tissue integrity  Goal: Damaged tissue is healing and protected  Description: Interventions:  1. Monitor/assess Braden scale every shift  2. Provide wound care per wound care algorithm  3. Reposition patient every 2 hours and as needed unless able to reposition self  4. Increase activity as tolerated/progressive mobility  5. Relieve pressure to bony prominences for patients at moderate and high risk  6. Avoid shearing injuries   7. Keep intact skin clean and dry  8. Use bath wipes, not soap and water, for daily bathing   9. Use incontinence wipes for cleaning urine, stool and caustic drainage; Foley care as needed   10. Monitor external devices/tubes for correct placement to prevent pressure, friction and shearing   11. Encourage use of lotion/moisturizer on skin  12. Monitor patient's hygiene practices  13. Consult/collaborate with wound care nurse   14. Utilize specialty bed  15. Consider placing an indwelling catheter if incontinence interferes with healing of stage 3 or 4 pressure injury  Outcome:  Progressing  Flowsheets (Taken 01/12/2022 0220)  Damaged tissue is healing and protected:   Monitor/assess Braden scale every shift   Provide wound care per wound care algorithm   Reposition patient every 2 hours and as needed unless able to reposition self   Increase activity as tolerated/progressive mobility   Relieve pressure to bony prominences for patients at moderate and high risk   Avoid shearing injuries   Keep intact skin clean and dry   Use bath wipes, not soap and water, for daily bathing     Problem: Compromised Tissue integrity  Goal: Damaged tissue is healing and protected  Description: Interventions:  1. Monitor/assess Braden scale every shift  2. Provide wound care per wound care algorithm  3. Reposition patient every 2 hours and as needed unless able to reposition self  4. Increase activity as tolerated/progressive mobility  5. Relieve pressure to bony prominences for patients at moderate and high risk  6. Avoid shearing injuries   7. Keep intact skin clean and dry  8. Use bath wipes, not soap and water, for  daily bathing   9. Use incontinence wipes for cleaning urine, stool and caustic drainage; Foley care as needed   10. Monitor external devices/tubes for correct placement to prevent pressure, friction and shearing   11. Encourage use of lotion/moisturizer on skin  12. Monitor patient's hygiene practices  13. Consult/collaborate with wound care nurse   14. Utilize specialty bed  15. Consider placing an indwelling catheter if incontinence interferes with healing of stage 3 or 4 pressure injury  Outcome: Progressing  Flowsheets (Taken 01/12/2022 0220)  Damaged tissue is healing and protected:   Monitor/assess Braden scale every shift   Provide wound care per wound care algorithm   Reposition patient every 2 hours and as needed unless able to reposition self   Increase activity as tolerated/progressive mobility   Relieve pressure to bony prominences for patients at moderate and high risk   Avoid  shearing injuries   Keep intact skin clean and dry   Use bath wipes, not soap and water, for daily bathing     Problem: Pain interferes with ability to perform ADL  Goal: Pain at adequate level as identified by patient  Description: Interventions:  1. Identify patient comfort function goal  2. Evaluate if patient comfort function goal is met  3. Assess pain on admission, during daily assessment and/or before any "as needed" intervention(s)  4. Reassess pain within 30-60 minutes of any procedure/intervention, per Pain Assessment, Intervention, Reassessment (AIR) Cycle  5. Evaluate patient's satisfaction with pain management progress  6. Offer non-pharmacological pain management interventions  7. Consult/collaborate with Pain Service  8. Consult/collaborate with Physical Therapy, Occupational Therapy, and/or Speech Therapy  9. Assess for risk of opioid induced respiratory depression and side effects, including snoring/sleep apnea. Alert healthcare team of risk factors identified.  10. Include patient/patient care companion in decisions related to pain management as needed  Outcome: Progressing  Flowsheets (Taken 01/12/2022 0220)  Pain at adequate level as identified by patient:   Identify patient comfort function goal   Assess pain on admission, during daily assessment and/or before any "as needed" intervention(s)   Reassess pain within 30-60 minutes of any procedure/intervention, per Pain Assessment, Intervention, Reassessment (AIR) Cycle   Evaluate if patient comfort function goal is met   Offer non-pharmacological pain management interventions     Problem: Pain interferes with ability to perform ADL  Goal: Pain at adequate level as identified by patient  Description: Interventions:  1. Identify patient comfort function goal  2. Evaluate if patient comfort function goal is met  3. Assess pain on admission, during daily assessment and/or before any "as needed" intervention(s)  4. Reassess pain within 30-60 minutes of  any procedure/intervention, per Pain Assessment, Intervention, Reassessment (AIR) Cycle  5. Evaluate patient's satisfaction with pain management progress  6. Offer non-pharmacological pain management interventions  7. Consult/collaborate with Pain Service  8. Consult/collaborate with Physical Therapy, Occupational Therapy, and/or Speech Therapy  9. Assess for risk of opioid induced respiratory depression and side effects, including snoring/sleep apnea. Alert healthcare team of risk factors identified.  10. Include patient/patient care companion in decisions related to pain management as needed  Outcome: Progressing  Flowsheets (Taken 01/12/2022 0220)  Pain at adequate level as identified by patient:   Identify patient comfort function goal   Assess pain on admission, during daily assessment and/or before any "as needed" intervention(s)   Reassess pain within 30-60 minutes of any procedure/intervention, per Pain Assessment, Intervention, Reassessment (AIR) Cycle   Evaluate if patient comfort function goal  is met   Offer non-pharmacological pain management interventions     Problem: Altered GI Function  Goal: Fluid and electrolyte balance are achieved/maintained  Description: Interventions:  1. Monitor intake and output every shift  2. Monitor/assess lab values and report abnormal values  3. Provide adequate hydration  4. Assess for confusion/personality changes  5. Monitor daily weight  6. Assess and reassess fluid and electrolyte status  7. Observe for seizure activity and initiate seizure precautions if indicated  8. Observe for cardiac arrhythmias  9. Monitor for muscle weakness  Outcome: Progressing  Flowsheets (Taken 01/11/2022 0228)  Fluid and electrolyte balance are achieved/maintained:   Monitor intake and output every shift   Monitor/assess lab values and report abnormal values   Provide adequate hydration   Monitor daily weight   Assess and reassess fluid and electrolyte status     Problem: Altered GI  Function  Goal: Fluid and electrolyte balance are achieved/maintained  Description: Interventions:  1. Monitor intake and output every shift  2. Monitor/assess lab values and report abnormal values  3. Provide adequate hydration  4. Assess for confusion/personality changes  5. Monitor daily weight  6. Assess and reassess fluid and electrolyte status  7. Observe for seizure activity and initiate seizure precautions if indicated  8. Observe for cardiac arrhythmias  9. Monitor for muscle weakness  Outcome: Progressing  Flowsheets (Taken 01/11/2022 0228)  Fluid and electrolyte balance are achieved/maintained:   Monitor intake and output every shift   Monitor/assess lab values and report abnormal values   Provide adequate hydration   Monitor daily weight   Assess and reassess fluid and electrolyte status

## 2022-01-12 NOTE — Plan of Care (Addendum)
NURSE NOTE SUMMARY  [facility] - 4TH SURGICAL   Patient Name: Nicole Sutton, Nicole Sutton   Attending Physician: Rea College, MD   Today's date:   01/12/2022 LOS: 3 days   Shift Summary:                                                              Patient complains of severe pain in abdomen. PRN IV dilaudid given x1 overnight. Patient states dilaudid is the only medication effective for her pain and oxycodone gives her a severe headache. Midline incision is covered with dressing that is clean dry and intact. Patient hopeful to [place]. Denies passing gas and no bm this shift. Voiding adequately .     Provider Notifications:        Rapid Response Notifications:  Mobility:      PMP Activity: Step 7 - Walks out of Room (01/12/2022  9:00 PM)     Weight tracking:  Family Dynamic:   No data found.          Last Bowel Movement   Last BM Date: 01/08/22        Problem: Compromised Tissue integrity  Goal: Damaged tissue is healing and protected  Outcome: Progressing  Flowsheets (Taken 01/12/2022 2354)  Damaged tissue is healing and protected:   Monitor/assess Braden scale every shift   Provide wound care per wound care algorithm   Reposition patient every 2 hours and as needed unless able to reposition self   Increase activity as tolerated/progressive mobility   Relieve pressure to bony prominences for patients at moderate and high risk   Avoid shearing injuries   Keep intact skin clean and dry   Use bath wipes, not soap and water, for daily bathing   Use incontinence wipes for cleaning urine, stool and caustic drainage. Foley care as needed  Goal: Nutritional status is improving  Outcome: Progressing     Problem: Side Effects from Pain Analgesia  Goal: Patient will experience minimal side effects of analgesic therapy  Outcome: Progressing     Problem: Altered GI Function  Goal: Fluid and electrolyte balance are achieved/maintained  Outcome: Progressing  Goal: Elimination patterns are normal or  improving  Outcome: Progressing  Goal: Nutritional intake is adequate  Outcome: Progressing  Goal: Mobility/Activity is maintained at optimal level for patient  Outcome: Progressing     Problem: Moderate/High Fall Risk Score >5  Goal: Patient will remain free of falls  Outcome: Progressing  Flowsheets (Taken 01/12/2022 2100)  VH Moderate Risk (6-13):   ALL REQUIRED LOW INTERVENTIONS   INITIATE YELLOW "FALL RISK" SIGNAGE   YELLOW NON-SKID SLIPPERS   YELLOW "FALL RISK" ARM BAND   PLACE FALL RISK LEVEL ON WHITE BOARD FOR COMMUNICATION PURPOSES IN PATIENT'S ROOM   USE OF BED EXIT ALARM IF PATIENT IS CONFUSED OR IMPULSIVE. PLACE RESET BED ALARM SIGN ABOVE BED

## 2022-01-13 ENCOUNTER — Encounter: Payer: Self-pay | Admitting: Surgery

## 2022-01-13 DIAGNOSIS — Z85831 Personal history of malignant neoplasm of soft tissue: Secondary | ICD-10-CM

## 2022-01-13 DIAGNOSIS — K565 Intestinal adhesions [bands], unspecified as to partial versus complete obstruction: Secondary | ICD-10-CM

## 2022-01-13 LAB — CBC AND DIFFERENTIAL
Basophils %: 0.7 % (ref 0.0–3.0)
Basophils Absolute: 0 10*3/uL (ref 0.0–0.3)
Eosinophils %: 12.7 % — ABNORMAL HIGH (ref 0.0–7.0)
Eosinophils Absolute: 0.8 10*3/uL (ref 0.0–0.8)
Hematocrit: 29.2 % — ABNORMAL LOW (ref 36.0–48.0)
Hemoglobin: 8.9 gm/dL — ABNORMAL LOW (ref 12.0–16.0)
Lymphocytes Absolute: 1 10*3/uL (ref 0.6–5.1)
Lymphocytes: 15.3 % (ref 15.0–46.0)
MCH: 32 pg (ref 28–35)
MCHC: 31 gm/dL — ABNORMAL LOW (ref 32–36)
MCV: 105 fL — ABNORMAL HIGH (ref 80–100)
MPV: 9.6 fL (ref 6.0–10.0)
Monocytes Absolute: 0.6 10*3/uL (ref 0.1–1.7)
Monocytes: 9.2 % (ref 3.0–15.0)
Neutrophils %: 62.1 % (ref 42.0–78.0)
Neutrophils Absolute: 3.9 10*3/uL (ref 1.7–8.6)
PLT CT: 242 10*3/uL (ref 130–440)
RBC: 2.79 10*6/uL — ABNORMAL LOW (ref 3.80–5.00)
RDW: 13.3 % (ref 11.0–14.0)
WBC: 6.2 10*3/uL (ref 4.0–11.0)

## 2022-01-13 LAB — MAGNESIUM: Magnesium: 1.9 mg/dL (ref 1.6–2.6)

## 2022-01-13 LAB — BASIC METABOLIC PANEL
Anion Gap: 11.9 mMol/L (ref 7.0–18.0)
BUN / Creatinine Ratio: 24.1 Ratio (ref 10.0–30.0)
BUN: 19 mg/dL (ref 7–22)
CO2: 20 mMol/L (ref 20–30)
Calcium: 8.4 mg/dL — ABNORMAL LOW (ref 8.5–10.5)
Chloride: 107 mMol/L (ref 98–110)
Creatinine: 0.79 mg/dL (ref 0.60–1.20)
EGFR: 79 mL/min/{1.73_m2} (ref 60–150)
Glucose: 71 mg/dL (ref 71–99)
Osmolality Calculated: 271 mOsm/kg — ABNORMAL LOW (ref 275–300)
Potassium: 3.9 mMol/L (ref 3.5–5.3)
Sodium: 135 mMol/L — ABNORMAL LOW (ref 136–147)

## 2022-01-13 LAB — PHOSPHORUS: Phosphorus: 2.5 mg/dL (ref 2.3–4.7)

## 2022-01-13 LAB — VH CULTURE, ANAEROBIC

## 2022-01-13 MED ORDER — DOCUSATE SODIUM 100 MG PO CAPS
100.0000 mg | ORAL_CAPSULE | Freq: Every day | ORAL | Status: DC
Start: 2022-01-13 — End: 2022-01-17
  Administered 2022-01-13 – 2022-01-17 (×5): 100 mg via ORAL
  Filled 2022-01-13 (×5): qty 1

## 2022-01-13 MED ORDER — KETOROLAC TROMETHAMINE 15 MG/ML IJ SOLN
15.0000 mg | Freq: Four times a day (QID) | INTRAMUSCULAR | Status: DC
Start: 2022-01-13 — End: 2022-01-14
  Administered 2022-01-13 (×2): 15 mg via INTRAVENOUS
  Filled 2022-01-13 (×2): qty 1

## 2022-01-13 MED ORDER — HYDROMORPHONE HCL 2 MG PO TABS
2.0000 mg | ORAL_TABLET | ORAL | Status: DC | PRN
Start: 2022-01-13 — End: 2022-01-16
  Administered 2022-01-15 – 2022-01-16 (×2): 2 mg via ORAL
  Filled 2022-01-13 (×2): qty 1

## 2022-01-13 MED ORDER — CYCLOBENZAPRINE HCL 10 MG PO TABS
5.0000 mg | ORAL_TABLET | Freq: Three times a day (TID) | ORAL | Status: DC
Start: 2022-01-13 — End: 2022-01-17
  Administered 2022-01-13 – 2022-01-17 (×12): 5 mg via ORAL
  Filled 2022-01-13 (×12): qty 1

## 2022-01-13 MED ORDER — ESCITALOPRAM OXALATE 10 MG PO TABS
20.0000 mg | ORAL_TABLET | Freq: Every morning | ORAL | Status: DC
Start: 2022-01-13 — End: 2022-01-17
  Administered 2022-01-13 – 2022-01-14 (×2): 20 mg via ORAL
  Administered 2022-01-15: 10 mg via ORAL
  Administered 2022-01-16 – 2022-01-17 (×2): 20 mg via ORAL
  Filled 2022-01-13 (×5): qty 2

## 2022-01-13 MED ORDER — HYDROMORPHONE HCL 0.5 MG/0.5 ML IJ SOLN
0.5000 mg | INTRAMUSCULAR | Status: DC | PRN
Start: 2022-01-13 — End: 2022-01-15
  Administered 2022-01-13 – 2022-01-14 (×3): 0.5 mg via INTRAVENOUS
  Filled 2022-01-13 (×3): qty 0.5

## 2022-01-13 MED ORDER — SENNOSIDES-DOCUSATE SODIUM 8.6-50 MG PO TABS
2.0000 | ORAL_TABLET | Freq: Every evening | ORAL | Status: DC
Start: 2022-01-13 — End: 2022-01-17
  Administered 2022-01-13 – 2022-01-16 (×4): 2 via ORAL
  Filled 2022-01-13 (×4): qty 2

## 2022-01-13 NOTE — Plan of Care (Addendum)
NURSE NOTE SUMMARY  [facility] - 4TH SURGICAL   Patient Name: [facility]   Attending Physician: Solon Palm, MD   Today's date:   01/13/2022 LOS: 4 days   Shift Summary:                                                              6144 Pt given pain medication and lidocaine patches applied to abdomen. Pt denies flatus or BM. OOB to chair this am.     0908 Toradol restarted for pain control, Pt aware.     1113 Pt reports passing flatus. NP made aware, Pt advanced to clear liquid diet.     1303 Pt tolerating clears. Resting comfortably up in chair, denies needs at this time. Call bell within reach.     5:02 PM Pt remains comfortable. Urine output adequate this shift continues to pass flatus, denies N/V. VSS. Call bell in reach.        Provider Notifications:        Rapid Response Notifications:  Mobility:      PMP Activity: Step 7 - Walks out of Room (01/13/2022 10:00 AM)     Weight tracking:  Family Dynamic:   No data found.          Last Bowel Movement   Last BM Date: 01/08/22       Problem: Compromised Tissue integrity  Goal: Damaged tissue is healing and protected  Outcome: Progressing  Flowsheets (Taken 01/12/2022 2354 by Barb Merino, RN)  Damaged tissue is healing and protected:   Monitor/assess Braden scale every shift   Provide wound care per wound care algorithm   Reposition patient every 2 hours and as needed unless able to reposition self   Increase activity as tolerated/progressive mobility   Relieve pressure to bony prominences for patients at moderate and high risk   Avoid shearing injuries   Keep intact skin clean and dry   Use bath wipes, not soap and water, for daily bathing   Use incontinence wipes for cleaning urine, stool and caustic drainage. Foley care as needed  Goal: Nutritional status is improving  Outcome: Progressing     Problem: Pain interferes with ability to perform ADL  Goal: Pain at adequate level as identified by patient  Outcome: Progressing      Problem: Side Effects from Pain Analgesia  Goal: Patient will experience minimal side effects of analgesic therapy  Outcome: Progressing     Problem: Altered GI Function  Goal: Fluid and electrolyte balance are achieved/maintained  Outcome: Progressing  Goal: Elimination patterns are normal or improving  Outcome: Progressing  Goal: Nutritional intake is adequate  Outcome: Progressing  Goal: Mobility/Activity is maintained at optimal level for patient  Outcome: Progressing     Problem: Moderate/High Fall Risk Score >5  Goal: Patient will remain free of falls  Outcome: Progressing

## 2022-01-13 NOTE — Progress Notes (Signed)
MOBILITY NOTE:     Precautions: Weight Bearing: Full weight bearing     Activity:  Ambulated in the hall x680 feet     Level of Assistance: A little     Device(s) used: Wheeled walker     Positioning frequency: Turns self as needed    Last Vitals: BP 140/73   Pulse 77   Temp 98.1 F (36.7 C) (Temporal)   Resp 18   Ht 1.702 m (5\' 7" )   Wt 75.3 kg (166 lb 0.1 oz)   SpO2 96%   BMI 26.00 kg/m       Pt in bed, ready to go for a walk. C/o abd pain rated 7/10 at rest and active. Offered cues to take it slow and steady and to grab on to the walker at all times. Pt back in room, sitting up in recliner. Bed side table and call bell within reach. RN notified

## 2022-01-13 NOTE — Progress Notes (Signed)
DAILY PROGRESS NOTE - Trauma Acute Care Surgery (TACS)   Name:  Nicole Sutton, Nicole Sutton     DOB:  04-11-1951   MR#:  [id number]               ROOM: 447/447-A    DATE:  01/13/22      Principal Diagnosis:  SBO (small bowel obstruction)    Refer to below for diagnoses being addressed for this encounter    ASSESSMENT & PLAN:                                                               Hospital Day: 5    s/p laparoscopic converted to exploratory laparotomy with SBR and LOA 8/7 with Dr Nedra Hai   CONDITION:  stable    Patient Active Hospital Problem List:  SBO (small bowel obstruction) (12/23/2021)           Assessment: sp above procedure, pain improved, small amount of flatus        Plan: clear liquids, IVF, pain control  and antiemetics, serial abdominal exams, monitor closely, start home meds once taking po, decreased dilaudid, added toradol, added dilaudid po for an option once tolerating clears (kidney functions did not change with day of toradol)    Gastroesophageal reflux disease (12/24/2021)           Assessment: POA, chronic         Plan: IV famotidine        Doreene Burke, NP    Juliann Pares [phone]  [facility] Trauma Acute Care Surgery [facility]) Program  Phone [phone] or Pager #[phone]  Incidental findings:  none  Additional Info:  Care plan and expectations reviewed with Patient  Questions were answered.  Discussed with bedside RN    24-hour interval history:      Meds:   acetaminophen, 1,000 mg, Intravenous, 4 times per day  enoxaparin, 40 mg, Subcutaneous, Q24H  famotidine, 20 mg, Intravenous, Q12H SCH  ketorolac, 15 mg, Intravenous, 4 times per day  lidocaine, 2 patch, Transdermal, Daily  piperacillin-tazobactam, 4.5 g, Intravenous, Q6H  sodium chloride (PF), 3 mL, Intravenous, Q12H SCH        Infusions:   dextrose 5 % and 0.45 % NaCl with KCl 20 mEq 83 mL/hr at 01/12/22 1727        DVT Prophylaxis:  enoxaparin 40 mg SQ Daily  Medication VTE Prophylaxis Orders: enoxaparin (LOVENOX) syringe 40 mg  Mechanical VTE Prophylaxis Orders:  Mechanical VTE: Pneumatic Compression; Knee high    Foley:  Yes          GI Prophylaxis:  Yes      BM last 24 Hours:  No   Bowel Regimen:  No  Diet:  Diet NPO effective now - Other (NG tube); Other- enter in Comments    I & O's:  Data reviewed Reviewed   UO 175 x1  Pain: reasonably controlled  EXAM:   Vitals:  Blood pressure 140/73, pulse 77, temperature 98.1 F (36.7 C), temperature source Temporal, resp. rate 18, height 1.702 m (5\' 7" ), weight 75.3 kg (166 lb 0.1 oz), SpO2 96 %.   General:   in no apparent distress, appears comfortable, non-toxic, elderly, frail  Pulmonary:   lungs clear to ausculation, equal breath sounds, unlabored respirations, no accessory  muscle use  Cardiac:   regular rate and rhythm without murmurs/rubs/gallops  Abdomen:   hypoactive bowel sounds, soft, moderate generalized tenderness without rebound and without guarding, non-distended midline staples and lap site staples intact with no drainage/erythema  Neuro:   alert, no gross focal neurologic deficits, moves all extremities well  Psych:   normal affect, insight good  Extremities:   no edema and peripheral pulses 2+ and symmetric  Skin:   no jaundice, no rashes, normothermic    Lab Data Reviewed:  Yes    Cultures:    Anaerobic cx - NGTD  Sterile body fluid cx/smear -  NGTD  Fungal cx - NGTD     Pathology:   1. Mesenteric band, excision:          Benign fibroadipose tissue with fibrosis and mild acute and chronic inflammation.   2. Mid jejunum, segmental resection:          Benign villiform small bowel with acute serositis and foreign body   reaction, consistent with suture granuloma.     CHEM:     Recent Labs   Lab 01/13/22  0407 01/12/22  0420 01/11/22  0936 01/10/22  0524 01/09/22  1416   Glucose 71 65* 88 145* 130*   Sodium 135* 138 137 136 139   Potassium 3.9 3.8 3.7 4.5 4.0   Chloride 107 109 107 107 104   CO2 20 23 24  19* 25   BUN 19 21 18 20  24*   Creatinine 0.79 0.84 0.83 0.82 1.03   Calcium 8.4* 8.1* 7.9* 8.5 9.9   Magnesium  1.9 1.9 1.9 1.8  --    Phosphorus 2.5 3.1 2.1* 2.8  --        CBC:       Recent Labs   Lab 01/13/22  0407 01/12/22  0420 01/11/22  0936 01/10/22  0524 01/09/22  1416   WBC 6.2 6.5 8.2 12.4* 6.3   Hemoglobin 8.9* 8.6* 8.8* 10.8* 11.3*   Hematocrit 29.2* 27.2* 27.8* 35.1* 35.3*   PLT CT 242 242 241 309 343   MCV 105* 104* 104* 105* 102*       BANDS:      POCT:      LFTs:      Recent Labs   Lab 01/10/22  0524 01/09/22  1416   ALT 19 19   AST (SGOT) 30 25   Alkaline Phosphatase 47 54   Bilirubin, Total 0.6 0.5   Bilirubin Direct 0.2  --    Albumin 3.3* 3.9       COAG:      Lactate:    Recent Labs   Lab 01/10/22  0543   Lactic Acid 0.8         Radiology:    No results found.

## 2022-01-13 NOTE — Progress Notes (Signed)
Quick Doc  [facility] - 4TH SURGICAL   Patient Name: [facility]   Attending Physician: Solon Palm, MD   Today's date:   01/13/2022 LOS: 4 days   Expected Discharge Date      Quick  Assessment:                                                              ReAdmit Risk Score: 12.21    CM Comments: 01/13/22 LPN DCP (AR) Patient progressing. Ambulating with staff in halls. IVA. IVF. Foley. PT/OT recommending home with supervision. Patient states that if needed she can stay at sister's home. CM will continue to follow.                                                                                       Provider Notifications:      Luna Glasgow LPN DCP  Discharge Planner 4th/2nd  Ext: [phone]

## 2022-01-13 NOTE — PT Plan of Care Note (Signed)
[facility]  [facility]  Department of Rehabilitation Services  [phone]  Jizelle Conkey CSN: [id number]  4TH SURGICAL 447/447-A    Physical Therapy General Note  Time: 1010; 1356    Last seen by a Physical Therapist vs. PTA: 01/12/22    Attempted to see patient for PT treatment.  At both attempts, patient reporting increased pain. She states she ambulated x3 laps already this morning and states she will have no trouble with the flight of stairs at home if discharged over the weekend.  Discussed that PT will follow-up with her next week if she is still admitted.    Plan: Will follow up per POC.     Sydell Axon, PT, DPT
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
## 2022-01-14 DIAGNOSIS — K219 Gastro-esophageal reflux disease without esophagitis: Secondary | ICD-10-CM

## 2022-01-14 DIAGNOSIS — N3281 Overactive bladder: Secondary | ICD-10-CM | POA: Diagnosis present

## 2022-01-14 LAB — CBC AND DIFFERENTIAL
Basophils %: 0.8 % (ref 0.0–3.0)
Basophils Absolute: 0 10*3/uL (ref 0.0–0.3)
Eosinophils %: 13.7 % — ABNORMAL HIGH (ref 0.0–7.0)
Eosinophils Absolute: 0.7 10*3/uL (ref 0.0–0.8)
Hematocrit: 28.7 % — ABNORMAL LOW (ref 36.0–48.0)
Hemoglobin: 9 gm/dL — ABNORMAL LOW (ref 12.0–16.0)
Lymphocytes Absolute: 1.3 10*3/uL (ref 0.6–5.1)
Lymphocytes: 24.7 % (ref 15.0–46.0)
MCH: 33 pg (ref 28–35)
MCHC: 31 gm/dL — ABNORMAL LOW (ref 32–36)
MCV: 104 fL — ABNORMAL HIGH (ref 80–100)
MPV: 9.5 fL (ref 6.0–10.0)
Monocytes Absolute: 0.6 10*3/uL (ref 0.1–1.7)
Monocytes: 11.3 % (ref 3.0–15.0)
Neutrophils %: 49.5 % (ref 42.0–78.0)
Neutrophils Absolute: 2.7 10*3/uL (ref 1.7–8.6)
PLT CT: 245 10*3/uL (ref 130–440)
RBC: 2.77 10*6/uL — ABNORMAL LOW (ref 3.80–5.00)
RDW: 13.1 % (ref 11.0–14.0)
WBC: 5.4 10*3/uL (ref 4.0–11.0)

## 2022-01-14 LAB — PHOSPHORUS: Phosphorus: 2.4 mg/dL (ref 2.3–4.7)

## 2022-01-14 LAB — BASIC METABOLIC PANEL
Anion Gap: 7.6 mMol/L (ref 7.0–18.0)
BUN / Creatinine Ratio: 17.3 Ratio (ref 10.0–30.0)
BUN: 13 mg/dL (ref 7–22)
CO2: 24 mMol/L (ref 20–30)
Calcium: 8.4 mg/dL — ABNORMAL LOW (ref 8.5–10.5)
Chloride: 108 mMol/L (ref 98–110)
Creatinine: 0.75 mg/dL (ref 0.60–1.20)
EGFR: 85 mL/min/{1.73_m2} (ref 60–150)
Glucose: 86 mg/dL (ref 71–99)
Osmolality Calculated: 271 mOsm/kg — ABNORMAL LOW (ref 275–300)
Potassium: 3.6 mMol/L (ref 3.5–5.3)
Sodium: 136 mMol/L (ref 136–147)

## 2022-01-14 LAB — MAGNESIUM: Magnesium: 1.9 mg/dL (ref 1.6–2.6)

## 2022-01-14 MED ORDER — OXYBUTYNIN CHLORIDE 5 MG PO TABS
5.0000 mg | ORAL_TABLET | Freq: Three times a day (TID) | ORAL | Status: DC
Start: 2022-01-14 — End: 2022-01-17
  Administered 2022-01-14 – 2022-01-17 (×9): 5 mg via ORAL
  Filled 2022-01-14 (×9): qty 1

## 2022-01-14 MED ORDER — FAMOTIDINE 20 MG PO TABS
20.0000 mg | ORAL_TABLET | Freq: Two times a day (BID) | ORAL | Status: DC
Start: 2022-01-14 — End: 2022-01-17
  Administered 2022-01-14 – 2022-01-17 (×6): 20 mg via ORAL
  Filled 2022-01-14 (×6): qty 1

## 2022-01-14 MED ORDER — VH MUSCLE RUB TOPICAL (WRAP)
TOPICAL_CREAM | CUTANEOUS | Status: DC | PRN
Start: 2022-01-14 — End: 2022-01-17
  Filled 2022-01-14: qty 85

## 2022-01-14 MED ORDER — CELECOXIB 100 MG PO CAPS
100.0000 mg | ORAL_CAPSULE | Freq: Two times a day (BID) | ORAL | Status: DC
Start: 2022-01-14 — End: 2022-01-17
  Administered 2022-01-14 – 2022-01-17 (×7): 100 mg via ORAL
  Filled 2022-01-14 (×7): qty 1

## 2022-01-14 NOTE — Consults (Signed)
The Vascular Access Team (VAT) was consulted for assistance with IV placement by the patients care team.     MRN: [id number]  NAME: Nicole Sutton, Nicole Sutton  DOB: [birth year]    Ultrasound was used to confirm patency of the Right forearm basilic vein prior to obtaining venous access. The area to be punctured was cleansed with chlorhexidine 2% for more than 30 seconds maintaining sterile precautions.    A sterile cover was sheathed to the Ultrasound probe. The vessel was then revisualized prior to puncture of the vessel with the selected needle under direct sonographic guidance and inserted per manufacturer guidelines. The catheter was flushed with normal saline to confirm brisk blood return via capped extension tubing. The catheter was stabilized on the skin using a sterile transparent occlusive dressing applied using sterile technique.      Insertion Site (vein): Right forearm  basilic  Catheter Gauge: IV: 07E  Total Length: 1.75"    Peripheral IV access placed by: Wyvonna Plum, RN      FINDINGS/CONCLUSIONS:   No signs or symptoms of related complications. Patient did tolerate procedure well.    Thank you for consulting the Vascular Access Team. Please notify us for further questions or concerns during hours of operation at Ext. [phone] or re-consulting our team when needed.

## 2022-01-14 NOTE — Progress Notes (Signed)
NURSE NOTE SUMMARY  [facility] - 4TH SURGICAL   Patient Name: [facility]   Attending Physician: Solon Palm, MD   Today's date:   01/14/2022 LOS: 5 days   Shift Summary:                                                              1610- Shift assessment completed. Patient alert and oriented x4. Bilateral radial and pedal pulses palpable. Patient reports full sensation to the BUE and numbness & tingling to the BLE that is her baseline. Bilateral lung sounds clear and diminished on RA. Bowel sounds hypoactive, patient reports she is passing flatus.  Midline ABD incision covered with mepilex, C/D/I. IVFs infusing per [facility].  1257- Patient reporting that her BLE are aching and painful. Patient requesting Bengay or Lidocaine cream.   EOS: Patient walked in hall with CNA this shift. Patient advanced to full liquid diet. Patient pulling 1000 on the IS, patient encouraged to use the IS 10 times per hour.    Provider Notifications:   1258Charlynne Pander, NP regarding patients legs and wanting a cream. New orders placed.      Rapid Response Notifications:  Mobility:      PMP Activity: Step 7 - Walks out of Room (01/14/2022  8:10 AM)     Weight tracking:  Family Dynamic:   No data found.          Last Bowel Movement   Last BM Date: 01/08/22 (Per patient)

## 2022-01-14 NOTE — Progress Notes (Signed)
Pharmacy IV to PO Conversion Note    Previous IV dose: famotidine 20 mg IV BID  New oral dose: famotidine 20 mg PO BID    1.  The patient met the following conversion criteria and can receive oral therapy.   Able to eat or tolerate enteral feeding OR  Receiving enteral nutrition by the oral, gastric, or nasogastric tube OR  Receiving other scheduled medications by the oral route    2.  Patient does not have any of the following:   Inability to swallow, refuses oral medication or is strict NPO  Malabsorption syndrome  Unresolved ileus or complete bowel obstruction  Active GI bleed (pantoprazole and famotidine only)  Seizures (lorazepam only)  Myxedema coma (levothyroxine only)  Organ donation (levothyroxine only)    If you have any questions, please contact the pharmacist at [phone].    Verble Styron, PharmD, BCPS  [facility]

## 2022-01-14 NOTE — Plan of Care (Addendum)
NURSE NOTE SUMMARY  [facility] - 4TH SURGICAL   Patient Name: [facility]   Attending Physician: Solon Palm, MD   Today's date:   01/14/2022 LOS: 5 days   Shift Summary:                                                              -Assumed care, patient reports of abdominal and back pain, prn and scheduled medications given per [facility]. Denies N/V, hypoactive bowels but passing flatus occasionally. Midline dressing c/d/I.     Provider Notifications:        Rapid Response Notifications:  Mobility:      PMP Activity: Step 7 - Walks out of Room (01/13/2022 10:00 PM)     Weight tracking:  Family Dynamic:   No data found.          Last Bowel Movement   Last BM Date: 01/08/22       Problem: Compromised Tissue integrity  Goal: Damaged tissue is healing and protected  Outcome: Progressing  Goal: Nutritional status is improving  Outcome: Progressing     Problem: Pain interferes with ability to perform ADL  Goal: Pain at adequate level as identified by patient  Outcome: Progressing     Problem: Side Effects from Pain Analgesia  Goal: Patient will experience minimal side effects of analgesic therapy  Outcome: Progressing     Problem: Altered GI Function  Goal: Fluid and electrolyte balance are achieved/maintained  Outcome: Progressing  Goal: Elimination patterns are normal or improving  Outcome: Progressing  Goal: Nutritional intake is adequate  Outcome: Progressing  Goal: Mobility/Activity is maintained at optimal level for patient  Outcome: Progressing     Problem: Moderate/High Fall Risk Score >5  Goal: Patient will remain free of falls  Outcome: Progressing

## 2022-01-14 NOTE — Progress Notes (Signed)
DAILY PROGRESS NOTE - Trauma Acute Care Surgery (TACS)   Name:  Nicole Sutton, Nicole Sutton     DOB:  Sep 21, 1950   MR#:  [id number]               ROOM: 447/447-A    DATE:  01/14/22      Principal Diagnosis:  Small bowel obstruction due to adhesions    Refer to below for diagnoses being addressed for this encounter       ASSESSMENT & PLAN:                                                              Hospital Day: 6    s/p lapatoscopy converted to laparotomy, small bowel resection and lysis of adhesions 8/7    CONDITION:  gradually improving    Patient Active Hospital Problem List:       Small bowel obstruction due to adhesions (12/23/2021)           Assessment: POD#5, tolerating clears, passing flatus, awaiting BM        Plan: advance diet to full liquids, bowel regimen, pain control.  Mobilize.  [place] IV fluids when tolerating adequate oral intake         Gastroesophageal reflux disease (12/24/2021)           Assessment: chronic, stable        Plan: famotidine.  No home meds listed.         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)           Assessment: noted history        Plan: continue outpatient follow up          Advance to full liquid diet, continue bowel regimen  Pain control  Mobilize in halls  Dispo planning- home when tolerating diet and has full return of bowel function    Theotis Burrow, NP    X [phone]  [facility] Trauma Acute Care Surgery Program  Phone [phone] or Pager #[phone]    Incidental findings:  none  Additional Info:  Care plan and expectations reviewed with Patient  Discussed with bedside RN    Subjective/Chief Complaint & ROS:   Resting in bed, states she tolerated clear liquids without nausea, increased pain or bloating.  Passing large amount of flatus, no BM yet.  Feeling hungry for more to eat    24-hour interval history:  diet advanced to clear liquids    Meds:   acetaminophen, 1,000 mg, Intravenous, 4 times per day  celecoxib, 100 mg, Oral, Q12H SCH  cyclobenzaprine, 5 mg, Oral, Q8H SCH  docusate  sodium, 100 mg, Oral, Daily  enoxaparin, 40 mg, Subcutaneous, Q24H  escitalopram, 20 mg, Oral, QAM  famotidine, 20 mg, Intravenous, Q12H SCH  lidocaine, 2 patch, Transdermal, Daily  senna-docusate, 2 tablet, Oral, QHS  sodium chloride (PF), 3 mL, Intravenous, Q12H SCH        Infusions:   dextrose 5 % and 0.45 % NaCl with KCl 20 mEq 83 mL/hr at 01/13/22 2329        DVT Prophylaxis:  enoxaparin 40 mg SQ Daily  Medication VTE Prophylaxis Orders: enoxaparin (LOVENOX) syringe 40 mg  Mechanical VTE Prophylaxis Orders: Mechanical VTE: Pneumatic Compression; Knee high    Foley:  No  GI Prophylaxis:  Yes      BM last 24 Hours:  No   Bowel Regimen:  Yes  Diet:  Diet full liquid     I & O's:  Data reviewed UO -  Pain: well controlled  EXAM:   Vitals:  Blood pressure 100/69, pulse 73, temperature 98.1 F (36.7 C), resp. rate 16, height 1.702 m (5\' 7" ), weight 75.3 kg (166 lb 0.1 oz), SpO2 96 %.   General:   in no apparent distress, appears comfortable, non-toxic  Pulmonary:   lungs clear to ausculation, equal breath sounds, unlabored respirations  Cardiac:   regular rate and rhythm  Abdomen:   hypoactive bowel sounds, soft, mild to moderate incisional tenderness without rebound and without guarding, mildly distended, dressing is Dry and Intact  Neuro:   alert, speech normal, oriented x 3, no gross focal neurologic deficits, moves all extremities well  Psych:   cooperative and calm  Extremities:   no edema  Skin:   normothermic    Lab Data Reviewed:  Yes - stable for days    Cultures:    Microbiology Results (last 15 days)       Procedure Component Value Units Date/Time    Multi-Specimen Fungal Culture and Smear [[id number]] Collected: 01/09/22 2148    Order Status: Completed Specimen: Peritoneal Fluid Updated: 01/10/22 0948     Fungal Smear No Fungal Elements Seen    Sterile Body Fluid Culture and Smear [[id number]] Collected: 01/09/22 2148    Order Status: Completed Specimen: Peritoneal Fluid Updated: 01/12/22  0909     Gram Stain --     No Epithelial Cells  No WBC's Seen  No Organisms Seen  Specimen performed by cytocentrifugation methodology       Culture Result No Growth Final    Anaerobic Culture [[id number]] Collected: 01/09/22 2148    Order Status: Completed Specimen: Peritoneal Fluid Updated: 01/13/22 0927     Culture Result No Anaerobes Isolated               CHEM:     Recent Labs   Lab 01/14/22  0450 01/13/22  0407 01/12/22  0420 01/11/22  0936 01/10/22  0524   Glucose 86 71 65* 88 145*   Sodium 136 135* 138 137 136   Potassium 3.6 3.9 3.8 3.7 4.5   Chloride 108 107 109 107 107   CO2 24 20 23 24  19*   BUN 13 19 21 18 20    Creatinine 0.75 0.79 0.84 0.83 0.82   Calcium 8.4* 8.4* 8.1* 7.9* 8.5   Magnesium 1.9 1.9 1.9 1.9 1.8   Phosphorus 2.4 2.5 3.1 2.1* 2.8     CBC:       Recent Labs   Lab 01/14/22  0450 01/13/22  0407 01/12/22  0420 01/11/22  0936 01/10/22  0524   WBC 5.4 6.2 6.5 8.2 12.4*   Hemoglobin 9.0* 8.9* 8.6* 8.8* 10.8*   Hematocrit 28.7* 29.2* 27.2* 27.8* 35.1*   PLT CT 245 242 242 241 309   MCV 104* 105* 104* 104* 105*     BANDS:      POCT:      LFTs:      Recent Labs   Lab 01/10/22  0524 01/09/22  1416   ALT 19 19   AST (SGOT) 30 25   Alkaline Phosphatase 47 54   Bilirubin, Total 0.6 0.5   Bilirubin Direct 0.2  --    Albumin 3.3* 3.9  COAG:      Lactate:    Recent Labs   Lab 01/10/22  0543   Lactic Acid 0.8       Radiology:   No results found.

## 2022-01-14 NOTE — Plan of Care (Addendum)
NURSE NOTE SUMMARY  [facility]   [facility]   Attending Physician: Solon PalmBoyd, Loretta A, MD   Today's date:   01/14/2022 LOS: 5 days   Shift Summary:                                                              Pt alert and oriented x4. C/o pain on BLE and abdomen. Managed with scheduled and PRN analgesic. Muscle rub applied on BLE per pt request and per MAR.  PIV intact and IV fluid infusing well. Abd. Midline incision dressing and 3 lap sites with staples, CDI. VSS. Pt reports passing flatus but no urge to move bowels yet. Call light within reach.   5:08 AM  Per patient report during pain re-assessment, pain on BLE and abdomen is a lot better and she was able to sleep.      Provider Notifications:        Rapid Response Notifications:  Mobility:      PMP Activity: Step 6 - Walks in Room (01/14/2022  8:00 PM)     Weight tracking:  Family Dynamic:   No data found.          Last Bowel Movement   Last BM Date: 01/08/22 (Per patient)        Problem: Compromised Tissue integrity  Goal: Damaged tissue is healing and protected  Outcome: Progressing  Flowsheets (Taken 01/14/2022 1033 by Otilio JeffersonShockey, Ashlyn B, RN)  Damaged tissue is healing and protected:   Monitor/assess Braden scale every shift   Provide wound care per wound care algorithm   Reposition patient every 2 hours and as needed unless able to reposition self   Increase activity as tolerated/progressive mobility   Relieve pressure to bony prominences for patients at moderate and high risk   Avoid shearing injuries   Keep intact skin clean and dry   Monitor external devices/tubes for correct placement to prevent pressure, friction and shearing  Goal: Nutritional status is improving  Outcome: Progressing  Flowsheets (Taken 01/14/2022 1033 by Otilio JeffersonShockey, Ashlyn B, RN)  Nutritional status is improving:   Assist patient with eating   Allow adequate time for meals   Encourage patient to take dietary supplement(s) as ordered    Include patient/patient care companion in decisions related to nutrition     Problem: Pain interferes with ability to perform ADL  Goal: Pain at adequate level as identified by patient  Outcome: Progressing  Flowsheets (Taken 01/14/2022 1033 by Otilio JeffersonShockey, Ashlyn B, RN)  Pain at adequate level as identified by patient:   Identify patient comfort function goal   Assess for risk of opioid induced respiratory depression, including snoring/sleep apnea. Alert healthcare team of risk factors identified.   Reassess pain within 30-60 minutes of any procedure/intervention, per Pain Assessment, Intervention, Reassessment (AIR) Cycle   Assess pain on admission, during daily assessment and/or before any "as needed" intervention(s)   Evaluate if patient comfort function goal is met   Evaluate patient's satisfaction with pain management progress   Include patient/patient care companion in decisions related to pain management as needed     Problem: Side Effects from Pain Analgesia  Goal: Patient will experience minimal side effects of analgesic therapy  Outcome: Progressing  Flowsheets (Taken 01/10/2022 1712  by Joya Salm, RN)  Patient will experience minimal side effects of analgesic therapy:   Monitor/assess patient's respiratory status (RR depth, effort, breath sounds)   Assess for changes in cognitive function   Prevent/manage side effects per LIP orders (i.e. nausea, vomiting, pruritus, constipation, urinary retention, etc.)   Evaluate for opioid-induced sedation with appropriate assessment tool (i.e. POSS)     Problem: Altered GI Function  Goal: Fluid and electrolyte balance are achieved/maintained  Outcome: Progressing  Flowsheets (Taken 01/11/2022 0228 by Monica Becton, RN)  Fluid and electrolyte balance are achieved/maintained:   Monitor intake and output every shift   Monitor/assess lab values and report abnormal values   Provide adequate hydration   Monitor daily weight   Assess and reassess fluid and electrolyte  status  Goal: Elimination patterns are normal or improving  Outcome: Progressing  Flowsheets (Taken 01/10/2022 1712 by Joya Salm, RN)  Elimination patterns are normal or improving:   Report abnormal assessment to physician   Assess for normal bowel sounds   Anticipate/assist with toileting needs   Monitor for abdominal distension   Assess for flatus   Monitor for abdominal discomfort  Goal: Nutritional intake is adequate  Outcome: Progressing  Flowsheets (Taken 01/14/2022 2337)  Nutritional intake is adequate:   Monitor daily weights   Assist patient with meals/food selection   Allow adequate time for meals   Encourage/perform oral hygiene as appropriate   Include patient/patient care companion in decisions related to nutrition   Assess anorexia, appetite, and amount of meal/food tolerated  Goal: Mobility/Activity is maintained at optimal level for patient  Outcome: Progressing  Flowsheets (Taken 01/10/2022 1712 by Joya Salm, RN)  Mobility/activity is maintained at optimal level for patient:   Increase mobility as tolerated/progressive mobility   Encourage independent activity per ability   Maintain proper body alignment   Plan activities to conserve energy, plan rest periods   Reposition patient every 2 hours and as needed unless able to reposition self     Problem: Moderate/High Fall Risk Score >5  Goal: Patient will remain free of falls  Outcome: Progressing  Flowsheets (Taken 01/14/2022 2000)  VH Moderate Risk (6-13):   ALL REQUIRED LOW INTERVENTIONS   INITIATE YELLOW "FALL RISK" SIGNAGE   YELLOW NON-SKID SLIPPERS   YELLOW "FALL RISK" ARM BAND   USE OF BED EXIT ALARM IF PATIENT IS CONFUSED OR IMPULSIVE. PLACE RESET BED ALARM SIGN ABOVE BED   PLACE FALL RISK LEVEL ON WHITE BOARD FOR COMMUNICATION PURPOSES IN PATIENT'S ROOM

## 2022-01-15 LAB — CBC AND DIFFERENTIAL
Basophils %: 0.9 % (ref 0.0–3.0)
Basophils Absolute: 0 10*3/uL (ref 0.0–0.3)
Eosinophils %: 14.3 % — ABNORMAL HIGH (ref 0.0–7.0)
Eosinophils Absolute: 0.8 10*3/uL (ref 0.0–0.8)
Hematocrit: 29.1 % — ABNORMAL LOW (ref 36.0–48.0)
Hemoglobin: 9.1 gm/dL — ABNORMAL LOW (ref 12.0–16.0)
Lymphocytes Absolute: 1.4 10*3/uL (ref 0.6–5.1)
Lymphocytes: 25.7 % (ref 15.0–46.0)
MCH: 33 pg (ref 28–35)
MCHC: 31 gm/dL — ABNORMAL LOW (ref 32–36)
MCV: 104 fL — ABNORMAL HIGH (ref 80–100)
MPV: 9.2 fL (ref 6.0–10.0)
Monocytes Absolute: 0.6 10*3/uL (ref 0.1–1.7)
Monocytes: 11.5 % (ref 3.0–15.0)
Neutrophils %: 47.7 % (ref 42.0–78.0)
Neutrophils Absolute: 2.6 10*3/uL (ref 1.7–8.6)
PLT CT: 246 10*3/uL (ref 130–440)
RBC: 2.8 10*6/uL — ABNORMAL LOW (ref 3.80–5.00)
RDW: 13.2 % (ref 11.0–14.0)
WBC: 5.3 10*3/uL (ref 4.0–11.0)

## 2022-01-15 LAB — BASIC METABOLIC PANEL
Anion Gap: 9.9 mMol/L (ref 7.0–18.0)
BUN / Creatinine Ratio: 9.2 Ratio — ABNORMAL LOW (ref 10.0–30.0)
BUN: 7 mg/dL (ref 7–22)
CO2: 24 mMol/L (ref 20–30)
Calcium: 8.6 mg/dL (ref 8.5–10.5)
Chloride: 111 mMol/L — ABNORMAL HIGH (ref 98–110)
Creatinine: 0.76 mg/dL (ref 0.60–1.20)
EGFR: 83 mL/min/{1.73_m2} (ref 60–150)
Glucose: 89 mg/dL (ref 71–99)
Osmolality Calculated: 279 mOsm/kg (ref 275–300)
Potassium: 3.9 mMol/L (ref 3.5–5.3)
Sodium: 141 mMol/L (ref 136–147)

## 2022-01-15 LAB — PHOSPHORUS: Phosphorus: 2.5 mg/dL (ref 2.3–4.7)

## 2022-01-15 LAB — MAGNESIUM: Magnesium: 1.7 mg/dL (ref 1.6–2.6)

## 2022-01-15 NOTE — Progress Notes (Signed)
NURSE NOTE SUMMARY  [facility] - 4TH SURGICAL   Patient Name: Nicole Sutton   Attending Physician: Solon Palm, MD   Today's date:   01/15/2022 LOS: 6 days   Shift Summary:                                                                Pt A&Ox4.  Calm, cooperative, and pleasant.  Flat affect.  Uses call bell to make needs known.  Dsg to ABD CDI, Lidocaine patches placed to left side of ABD DSG.  States she is passing gas.  Hypoactive Bowel sounds. Pt tolerating diet and request meal to be advanced.     Pt ambulating in hallway, sometimes 12 laps at a time. IVF infusing with no issues.  PRN and scheduled pain medications administered per [name]   Provider Notifications:        Rapid Response Notifications:  Mobility:      PMP Activity: Step 6 - Walks in Room (01/15/2022  8:20 AM)     Weight tracking:  Family Dynamic:   No data found.          Last Bowel Movement   Last BM Date: 01/08/22

## 2022-01-15 NOTE — Progress Notes (Signed)
DAILY PROGRESS NOTE - Trauma Acute Care Surgery (TACS)   Name:  Nicole Sutton, Nicole Sutton     DOB:  21-Feb-1951   MR#:  [id number]               ROOM: 447/447-A    DATE:  01/15/22      Principal Diagnosis:  Small bowel obstruction due to adhesions    Refer to below for diagnoses being addressed for this encounter       ASSESSMENT & PLAN:                                                              Hospital Day: 7    s/p lapatoscopy converted to laparotomy, small bowel resection and lysis of adhesions 8/7    CONDITION:  gradually improving    Patient Active Hospital Problem List:       Small bowel obstruction due to adhesions (12/23/2021)           Assessment: POD#6, tolerating full liquids, passing flatus, awaiting BM        Plan: full liquid diet, bowel regimen, pain control.  Mobilize.           Gastroesophageal reflux disease (12/24/2021)           Assessment: chronic, stable        Plan: famotidine.  No home meds listed.         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)           Assessment: noted history        Plan: continue outpatient follow up          Continue full liquid diet and bowel regimen  Pain control- [place] IV dilaudid, Dilaudid PO PRN ordered  Mobilize in halls  Dispo planning- home when tolerating diet and has full return of bowel function    Theotis Burrow, NP    X [phone]  [facility] Trauma Acute Care Surgery Program  Phone [phone] or Pager #[phone]    Incidental findings:  none  Additional Info:  Care plan and expectations reviewed with Patient  Discussed with bedside RN    Subjective/Chief Complaint & ROS:   Resting in bed, reports tolerating full liquid diet and passing flatus.  No BM yet, states she doesn't think she'll have a BM until she gets real food.    24-hour interval history:  diet advanced to full liquids, ditropan started for home med substitute, leg pain- [place] ordered    Meds:   celecoxib, 100 mg, Oral, Q12H SCH  cyclobenzaprine, 5 mg, Oral, Q8H SCH  docusate sodium, 100 mg, Oral,  Daily  enoxaparin, 40 mg, Subcutaneous, Q24H  escitalopram, 20 mg, Oral, QAM  famotidine, 20 mg, Oral, Q12H SCH  lidocaine, 2 patch, Transdermal, Daily  oxybutynin, 5 mg, Oral, TID  senna-docusate, 2 tablet, Oral, QHS  sodium chloride (PF), 3 mL, Intravenous, Q12H SCH        Infusions:   dextrose 5 % and 0.45 % NaCl with KCl 20 mEq 20 mL/hr at 01/14/22 2054        DVT Prophylaxis:  enoxaparin 40 mg SQ Daily  Medication VTE Prophylaxis Orders: enoxaparin (LOVENOX) syringe 40 mg  Mechanical VTE Prophylaxis Orders: Mechanical VTE: Pneumatic Compression; Knee high    Foley:  No          GI Prophylaxis:  Yes      BM last 24 Hours:  No   Bowel Regimen:  Yes  Diet:  Diet full liquid     I & O's:  Data reviewed UO -  Pain: well controlled  EXAM:   Vitals:  Blood pressure 135/78, pulse 74, temperature 97.9 F (36.6 C), temperature source Temporal, resp. rate 16, height 1.702 m (5\' 7" ), weight 75.3 kg (166 lb 0.1 oz), SpO2 100 %.   General:   in no apparent distress, appears comfortable, non-toxic  Pulmonary:   lungs clear to ausculation, equal breath sounds, unlabored respirations  Cardiac:   regular rate and rhythm  Abdomen:   hypoactive bowel sounds, soft, mild to moderate incisional tenderness without rebound and without guarding, mildly distended, dressing is Dry and Intact  Neuro:   alert, speech normal, oriented x 3, no gross focal neurologic deficits, moves all extremities well  Psych:   cooperative and calm  Extremities:   no edema  Skin:   normothermic    Lab Data Reviewed:  Yes - stable for days    Cultures:    Microbiology Results (last 15 days)       Procedure Component Value Units Date/Time    Multi-Specimen Fungal Culture and Smear [[id number]] Collected: 01/09/22 2148    Order Status: Completed Specimen: Peritoneal Fluid Updated: 01/10/22 0948     Fungal Smear No Fungal Elements Seen    Sterile Body Fluid Culture and Smear [[id number]] Collected: 01/09/22 2148    Order Status: Completed Specimen:  Peritoneal Fluid Updated: 01/12/22 0909     Gram Stain --     No Epithelial Cells  No WBC's Seen  No Organisms Seen  Specimen performed by cytocentrifugation methodology       Culture Result No Growth Final    Anaerobic Culture [[id number]] Collected: 01/09/22 2148    Order Status: Completed Specimen: Peritoneal Fluid Updated: 01/13/22 0927     Culture Result No Anaerobes Isolated               CHEM:     Recent Labs   Lab 01/15/22  0522 01/14/22  0450 01/13/22  0407 01/12/22  0420 01/11/22  0936   Glucose 89 86 71 65* 88   Sodium 141 136 135* 138 137   Potassium 3.9 3.6 3.9 3.8 3.7   Chloride 111* 108 107 109 107   CO2 24 24 20 23 24    BUN 7 13 19 21 18    Creatinine 0.76 0.75 0.79 0.84 0.83   Calcium 8.6 8.4* 8.4* 8.1* 7.9*   Magnesium 1.7 1.9 1.9 1.9 1.9   Phosphorus 2.5 2.4 2.5 3.1 2.1*       CBC:       Recent Labs   Lab 01/15/22  0522 01/14/22  0450 01/13/22  0407 01/12/22  0420 01/11/22  0936   WBC 5.3 5.4 6.2 6.5 8.2   Hemoglobin 9.1* 9.0* 8.9* 8.6* 8.8*   Hematocrit 29.1* 28.7* 29.2* 27.2* 27.8*   PLT CT 246 245 242 242 241   MCV 104* 104* 105* 104* 104*       BANDS:      POCT:      LFTs:      Recent Labs   Lab 01/10/22  0524 01/09/22  1416   ALT 19 19   AST (SGOT) 30 25   Alkaline Phosphatase 47 54   Bilirubin, Total 0.6  0.5   Bilirubin Direct 0.2  --    Albumin 3.3* 3.9       COAG:      Lactate:    Recent Labs   Lab 01/10/22  0543   Lactic Acid 0.8         Radiology:   No results found.

## 2022-01-15 NOTE — Plan of Care (Signed)
Problem: Compromised Tissue integrity  Goal: Damaged tissue is healing and protected  Description: Interventions:  1. Monitor/assess Braden scale every shift  2. Provide wound care per wound care algorithm  3. Reposition patient every 2 hours and as needed unless able to reposition self  4. Increase activity as tolerated/progressive mobility  5. Relieve pressure to bony prominences for patients at moderate and high risk  6. Avoid shearing injuries   7. Keep intact skin clean and dry  8. Use bath wipes, not soap and water, for daily bathing   9. Use incontinence wipes for cleaning urine, stool and caustic drainage; Foley care as needed   10. Monitor external devices/tubes for correct placement to prevent pressure, friction and shearing   11. Encourage use of lotion/moisturizer on skin  12. Monitor patient's hygiene practices  13. Consult/collaborate with wound care nurse   14. Utilize specialty bed  15. Consider placing an indwelling catheter if incontinence interferes with healing of stage 3 or 4 pressure injury  Outcome: Progressing  Goal: Nutritional status is improving  Description: Interventions:  1. Assist patient with eating   2. Allow adequate time for meals   3. Encourage patient to take dietary supplement(s) as ordered   4. Collaborate with Clinical Nutritionist  5. Include patient/patient care companion in decisions related to nutrition  Outcome: Progressing     Problem: Pain interferes with ability to perform ADL  Goal: Pain at adequate level as identified by patient  Description: Interventions:  1. Identify patient comfort function goal  2. Evaluate if patient comfort function goal is met  3. Assess pain on admission, during daily assessment and/or before any "as needed" intervention(s)  4. Reassess pain within 30-60 minutes of any procedure/intervention, per Pain Assessment, Intervention, Reassessment (AIR) Cycle  5. Evaluate patient's satisfaction with pain management progress  6. Offer  non-pharmacological pain management interventions  7. Consult/collaborate with Pain Service  8. Consult/collaborate with Physical Therapy, Occupational Therapy, and/or Speech Therapy  9. Assess for risk of opioid induced respiratory depression and side effects, including snoring/sleep apnea. Alert healthcare team of risk factors identified.  10. Include patient/patient care companion in decisions related to pain management as needed  Outcome: Progressing     Problem: Side Effects from Pain Analgesia  Goal: Patient will experience minimal side effects of analgesic therapy  Description: Interventions:  1. Monitor/assess patient's respiratory status (RR depth, effort, breath sounds)  2. Assess for changes in cognitive function   3. Prevent/manage side effects per LIP orders (i.e. nausea, vomiting, pruritus, constipation, urinary retention, etc.)  4. Evaluate for opioid-induced sedation with appropriate assessment tool (i.e. POSS)  Outcome: Progressing     Problem: Altered GI Function  Goal: Fluid and electrolyte balance are achieved/maintained  Description: Interventions:  1. Monitor intake and output every shift  2. Monitor/assess lab values and report abnormal values  3. Provide adequate hydration  4. Assess for confusion/personality changes  5. Monitor daily weight  6. Assess and reassess fluid and electrolyte status  7. Observe for seizure activity and initiate seizure precautions if indicated  8. Observe for cardiac arrhythmias  9. Monitor for muscle weakness  Outcome: Progressing  Goal: Elimination patterns are normal or improving  Description: Interventions:  1.      Report abnormal assessment to physician  2.      Anticipate/assist with toileting needs  3.      Assess for normal bowel sounds  4.      Monitor for abdominal distension  5.  Monitor for abdominal discomfort  6.      Assess for signs and symptoms of bleeding.  Report signs of bleeding to physician   7.      Administer treatments as ordered  8.       Consult/collaborate with Clinical Nutritionist  9.      Assess for flatus  10.   Assess for and discuss C. diff screening with LIP  11.   Collaborate with LIP for containment device  12.   Reinforce education on foods that improve and complicate bowel movements and how activity and medications can affect bowel movements  13.   Administer medications to improve bowel evacuation as prescribed  14.   Encourage/perform oral hygiene as appropriate  Outcome: Progressing  Goal: Nutritional intake is adequate  Description: Interventions:  1. Monitor daily weights  2. Assist patient with meals/food selection  3. Allow adequate time for meals  4. Encourage/perform oral hygiene as appropriate   5. Encourage/administer dietary supplements as ordered (i.e. tube feed, TPN, oral, OGT/NGT, supplements)  6. Consult/collaborate with Clinical Nutritionist  7. Include patient/patient care companion in decisions related to nutrition  8. Assess anorexia, appetite, and amount of meal/food tolerated  9. Consult/collaborate with Speech Therapy (swallow evaluations)  Outcome: Progressing  Goal: Mobility/Activity is maintained at optimal level for patient  Description: Interventions:  1. Increase mobility as tolerated/progressive mobility  2. Encourage independent activity per ability  3. Maintain proper body alignment  4. Perform active/passive ROM  5. Plan activities to conserve energy, plan rest periods  6. Reposition patient every 2 hours and as needed unless able to reposition self  7. Assess for changes in respiratory status, level of consciousness and/or development of fatigue  8. Consult/collaborate with Physical Therapy and/or Occupational Therapy  Outcome: Progressing

## 2022-01-15 NOTE — Progress Notes (Addendum)
1915: change of shift/bedside report received from Cordelia Pen, RN    2130: patient resting in bed, alert and oriented, denies nausea, reports 6/10 "deep" pain in abdomen as well as baseline aching pain of right leg- scheduled medication administered, lung sounds clear but diminished on room air, reports regular incentive spirometer use achieving 750-1075mL, abdomen soft/tender with midline surgical dressing, bowel sounds active and reports passing flatus but denies bowel movement, denies issues with urination, bilateral upper/lower extremity pulses intact with pain/numbness/tingling of right leg, IV flushed/infusing, given fresh water, no further needs expressed     0219: sticky note left for providers to request dressing change instructions if needed     0704: change of shift/bedside report given to Lavonne Chick, RN

## 2022-01-16 LAB — BASIC METABOLIC PANEL
Anion Gap: 12 mMol/L (ref 7.0–18.0)
BUN / Creatinine Ratio: 11.3 Ratio (ref 10.0–30.0)
BUN: 8 mg/dL (ref 7–22)
CO2: 24 mMol/L (ref 20–30)
Calcium: 8.8 mg/dL (ref 8.5–10.5)
Chloride: 109 mMol/L (ref 98–110)
Creatinine: 0.71 mg/dL (ref 0.60–1.20)
EGFR: 90 mL/min/{1.73_m2} (ref 60–150)
Glucose: 88 mg/dL (ref 71–99)
Osmolality Calculated: 279 mOsm/kg (ref 275–300)
Potassium: 4 mMol/L (ref 3.5–5.3)
Sodium: 141 mMol/L (ref 136–147)

## 2022-01-16 LAB — CBC AND DIFFERENTIAL
Basophils %: 1.2 % (ref 0.0–3.0)
Basophils Absolute: 0.1 10*3/uL (ref 0.0–0.3)
Eosinophils %: 13.4 % — ABNORMAL HIGH (ref 0.0–7.0)
Eosinophils Absolute: 0.7 10*3/uL (ref 0.0–0.8)
Hematocrit: 29.8 % — ABNORMAL LOW (ref 36.0–48.0)
Hemoglobin: 9.2 gm/dL — ABNORMAL LOW (ref 12.0–16.0)
Lymphocytes Absolute: 1.5 10*3/uL (ref 0.6–5.1)
Lymphocytes: 28.7 % (ref 15.0–46.0)
MCH: 32 pg (ref 28–35)
MCHC: 31 gm/dL — ABNORMAL LOW (ref 32–36)
MCV: 105 fL — ABNORMAL HIGH (ref 80–100)
MPV: 9.7 fL (ref 6.0–10.0)
Monocytes Absolute: 0.6 10*3/uL (ref 0.1–1.7)
Monocytes: 11.3 % (ref 3.0–15.0)
Neutrophils %: 45.4 % (ref 42.0–78.0)
Neutrophils Absolute: 2.3 10*3/uL (ref 1.7–8.6)
PLT CT: 251 10*3/uL (ref 130–440)
RBC: 2.85 10*6/uL — ABNORMAL LOW (ref 3.80–5.00)
RDW: 13.3 % (ref 11.0–14.0)
WBC: 5.1 10*3/uL (ref 4.0–11.0)

## 2022-01-16 LAB — MAGNESIUM: Magnesium: 1.8 mg/dL (ref 1.6–2.6)

## 2022-01-16 LAB — PHOSPHORUS: Phosphorus: 3 mg/dL (ref 2.3–4.7)

## 2022-01-16 MED ORDER — HYDROMORPHONE HCL 2 MG PO TABS
2.0000 mg | ORAL_TABLET | ORAL | Status: DC | PRN
Start: 2022-01-16 — End: 2022-01-17
  Filled 2022-01-16: qty 1

## 2022-01-16 MED ORDER — ACETAMINOPHEN 500 MG PO TABS
1000.0000 mg | ORAL_TABLET | Freq: Three times a day (TID) | ORAL | Status: DC
Start: 2022-01-16 — End: 2022-01-17
  Administered 2022-01-16 – 2022-01-17 (×3): 1000 mg via ORAL
  Filled 2022-01-16 (×3): qty 2
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
## 2022-01-16 NOTE — Plan of Care (Signed)
Problem: Compromised Tissue integrity  Goal: Damaged tissue is healing and protected  Outcome: Progressing  Flowsheets (Taken 01/16/2022 0220)  Damaged tissue is healing and protected:   Monitor/assess Braden scale every shift   Reposition patient every 2 hours and as needed unless able to reposition self   Increase activity as tolerated/progressive mobility   Relieve pressure to bony prominences for patients at moderate and high risk   Avoid shearing injuries   Keep intact skin clean and dry   Use incontinence wipes for cleaning urine, stool and caustic drainage. Foley care as needed   Monitor external devices/tubes for correct placement to prevent pressure, friction and shearing   Monitor patient's hygiene practices  Goal: Nutritional status is improving  Outcome: Progressing  Flowsheets (Taken 01/16/2022 0220)  Nutritional status is improving:   Allow adequate time for meals   Include patient/patient care companion in decisions related to nutrition     Problem: Pain interferes with ability to perform ADL  Goal: Pain at adequate level as identified by patient  Outcome: Progressing  Flowsheets (Taken 01/16/2022 0220)  Pain at adequate level as identified by patient:   Identify patient comfort function goal   Assess for risk of opioid induced respiratory depression, including snoring/sleep apnea. Alert healthcare team of risk factors identified.   Assess pain on admission, during daily assessment and/or before any "as needed" intervention(s)   Reassess pain within 30-60 minutes of any procedure/intervention, per Pain Assessment, Intervention, Reassessment (AIR) Cycle   Evaluate if patient comfort function goal is met   Evaluate patient's satisfaction with pain management progress   Offer non-pharmacological pain management interventions   Include patient/patient care companion in decisions related to pain management as needed     Problem: Side Effects from Pain Analgesia  Goal: Patient will experience minimal side  effects of analgesic therapy  Outcome: Progressing  Flowsheets (Taken 01/16/2022 0220)  Patient will experience minimal side effects of analgesic therapy:   Monitor/assess patient's respiratory status (RR depth, effort, breath sounds)   Assess for changes in cognitive function   Prevent/manage side effects per LIP orders (i.e. nausea, vomiting, pruritus, constipation, urinary retention, etc.)   Evaluate for opioid-induced sedation with appropriate assessment tool (i.e. POSS)     Problem: Altered GI Function  Goal: Fluid and electrolyte balance are achieved/maintained  Outcome: Progressing  Flowsheets (Taken 01/16/2022 0220)  Fluid and electrolyte balance are achieved/maintained:   Monitor intake and output every shift   Monitor/assess lab values and report abnormal values   Provide adequate hydration   Assess for confusion/personality changes   Assess and reassess fluid and electrolyte status   Monitor for muscle weakness  Goal: Elimination patterns are normal or improving  Outcome: Progressing  Flowsheets (Taken 01/16/2022 0220)  Elimination patterns are normal or improving:   Report abnormal assessment to physician   Assess for normal bowel sounds   Monitor for abdominal discomfort   Administer treatments as ordered   Assess for flatus   Administer medications to improve bowel evacuation as prescribed   Anticipate/assist with toileting needs   Monitor for abdominal distension   Assess for signs and symptoms of bleeding.  Report signs of bleeding to physician   Reinforce education on foods that improve and complicate bowel movements and how activity and medications can affect bowel movements   Encourage /perform oral hygiene as appropriate  Goal: Nutritional intake is adequate  Outcome: Progressing  Flowsheets (Taken 01/16/2022 0220)  Nutritional intake is adequate:   Assist patient with  meals/food selection   Allow adequate time for meals   Encourage/perform oral hygiene as appropriate   Include patient/patient care  companion in decisions related to nutrition   Assess anorexia, appetite, and amount of meal/food tolerated  Goal: Mobility/Activity is maintained at optimal level for patient  Outcome: Progressing  Flowsheets (Taken 01/16/2022 0220)  Mobility/activity is maintained at optimal level for patient:   Increase mobility as tolerated/progressive mobility   Encourage independent activity per ability   Maintain proper body alignment   Perform active/passive ROM   Plan activities to conserve energy, plan rest periods   Reposition patient every 2 hours and as needed unless able to reposition self   Assess for changes in respiratory status, level of consciousness and/or development of fatigue     Problem: Moderate/High Fall Risk Score >5  Goal: Patient will remain free of falls  Outcome: Progressing  Flowsheets (Taken 01/15/2022 2130)  VH Moderate Risk (6-13):   ALL REQUIRED LOW INTERVENTIONS   INITIATE YELLOW "FALL RISK" SIGNAGE   YELLOW NON-SKID SLIPPERS   YELLOW "FALL RISK" ARM BAND   USE OF BED EXIT ALARM IF PATIENT IS CONFUSED OR IMPULSIVE. PLACE RESET BED ALARM SIGN ABOVE BED   Include family/significant other in multidisciplinary discussion regarding plan of care as appropriate

## 2022-01-16 NOTE — Progress Notes (Signed)
DAILY PROGRESS NOTE - Trauma Acute Care Surgery (TACS)   Name:  Nicole Sutton, Nicole Sutton     DOB:  30-Jan-1951   MR#:  [id number]               ROOM: 447/447-A    DATE:  01/16/22      Principal Diagnosis:  Small bowel obstruction due to adhesions    Refer to below for diagnoses being addressed for this encounter       ASSESSMENT & PLAN:                                                              Hospital Day: 8    s/p lapatoscopy converted to laparotomy, small bowel resection and lysis of adhesions 8/7    CONDITION:  gradually improving    Patient Active Hospital Problem List:       Small bowel obstruction due to adhesions (12/23/2021)           Assessment: POD#7, tolerating full liquids, passing flatus, awaiting BM        Plan: continue full liquid diet, bowel regimen, pain control.  Mobilize.  May have hard candy or gum.         Gastroesophageal reflux disease (12/24/2021)           Assessment: chronic, stable        Plan: famotidine.  No home meds listed.         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)           Assessment: noted history        Plan: continue outpatient follow up          Continue full liquid diet and bowel regimen  Pain control- celebrex, lidoderm patches, and Dilaudid PO PRN   Mobilize in halls  Dispo planning- home when tolerating diet advancement and has full return of bowel function    Theotis Burrow, NP    X [phone]  [facility] Trauma Acute Care Surgery Program  Phone [phone] or Pager #[phone]    Incidental findings:  none  Additional Info:  Care plan and expectations reviewed with Patient  Discussed with bedside RN    Subjective/Chief Complaint & ROS:   Up walking in room.  Tolerating full liquids, passing flatus, no BM yet.  Hungry for more to eat    24-hour interval history:  awaiting return of bowel function    Meds:   celecoxib, 100 mg, Oral, Q12H SCH  cyclobenzaprine, 5 mg, Oral, Q8H SCH  docusate sodium, 100 mg, Oral, Daily  enoxaparin, 40 mg, Subcutaneous, Q24H  escitalopram, 20 mg, Oral,  QAM  famotidine, 20 mg, Oral, Q12H SCH  lidocaine, 2 patch, Transdermal, Daily  oxybutynin, 5 mg, Oral, TID  senna-docusate, 2 tablet, Oral, QHS  sodium chloride (PF), 3 mL, Intravenous, Q12H SCH        Infusions:   dextrose 5 % and 0.45 % NaCl with KCl 20 mEq 20 mL/hr at 01/14/22 2054        DVT Prophylaxis:  enoxaparin 40 mg SQ Daily  Medication VTE Prophylaxis Orders: enoxaparin (LOVENOX) syringe 40 mg  Mechanical VTE Prophylaxis Orders: Mechanical VTE: Pneumatic Compression; Knee high    Foley:  No  GI Prophylaxis:  Yes      BM last 24 Hours:  No   Bowel Regimen:  Yes  Diet:  Diet full liquid Patient preferences: Crackers OK - may add butter, jelly, PB, honey, etc     I & O's:  Data reviewed UO -  Pain: well controlled  EXAM:   Vitals:  Blood pressure 128/63, pulse 74, temperature 97.2 F (36.2 C), temperature source Temporal, resp. rate 18, height 1.702 m (5\' 7" ), weight 75.3 kg (166 lb 0.1 oz), SpO2 99 %.   General:   in no apparent distress, appears comfortable, non-toxic  Pulmonary:   lungs clear to ausculation, equal breath sounds, unlabored respirations  Cardiac:   regular rate and rhythm  Abdomen:   hypoactive bowel sounds, soft, mild generalized and mostly incisional tenderness without rebound and without guarding, mildly distended, dressing is Dry and Intact  Neuro:   alert, speech normal, oriented x 3, no gross focal neurologic deficits, moves all extremities well  Psych:   cooperative and calm  Extremities:   no edema  Skin:   normothermic    Lab Data Reviewed:  Yes - stable for days, no new interventions    Cultures:    Microbiology Results (last 15 days)       Procedure Component Value Units Date/Time    Multi-Specimen Fungal Culture and Smear [[id number]] Collected: 01/09/22 2148    Order Status: Completed Specimen: Peritoneal Fluid Updated: 01/10/22 0948     Fungal Smear No Fungal Elements Seen    Sterile Body Fluid Culture and Smear [[id number]] Collected: 01/09/22 2148    Order  Status: Completed Specimen: Peritoneal Fluid Updated: 01/12/22 0909     Gram Stain --     No Epithelial Cells  No WBC's Seen  No Organisms Seen  Specimen performed by cytocentrifugation methodology       Culture Result No Growth Final    Anaerobic Culture [[id number]] Collected: 01/09/22 2148    Order Status: Completed Specimen: Peritoneal Fluid Updated: 01/13/22 0927     Culture Result No Anaerobes Isolated               CHEM:     Recent Labs   Lab 01/16/22  0427 01/15/22  0522 01/14/22  0450 01/13/22  0407 01/12/22  0420   Glucose 88 89 86 71 65*   Sodium 141 141 136 135* 138   Potassium 4.0 3.9 3.6 3.9 3.8   Chloride 109 111* 108 107 109   CO2 24 24 24 20 23    BUN 8 7 13 19 21    Creatinine 0.71 0.76 0.75 0.79 0.84   Calcium 8.8 8.6 8.4* 8.4* 8.1*   Magnesium 1.8 1.7 1.9 1.9 1.9   Phosphorus 3.0 2.5 2.4 2.5 3.1       CBC:       Recent Labs   Lab 01/16/22  0427 01/15/22  0522 01/14/22  0450 01/13/22  0407 01/12/22  0420   WBC 5.1 5.3 5.4 6.2 6.5   Hemoglobin 9.2* 9.1* 9.0* 8.9* 8.6*   Hematocrit 29.8* 29.1* 28.7* 29.2* 27.2*   PLT CT 251 246 245 242 242   MCV 105* 104* 104* 105* 104*       BANDS:      POCT:      LFTs:      Recent Labs   Lab 01/10/22  0524 01/09/22  1416   ALT 19 19   AST (SGOT) 30 25   Alkaline Phosphatase 47  54   Bilirubin, Total 0.6 0.5   Bilirubin Direct 0.2  --    Albumin 3.3* 3.9       COAG:      Lactate:    Recent Labs   Lab 01/10/22  0543   Lactic Acid 0.8         Radiology:   No results found.

## 2022-01-16 NOTE — Plan of Care (Signed)
NURSE NOTE SUMMARY  [facility] - 4TH SURGICAL   Patient Name: Nicole Sutton   Attending Physician: Solon Palm, MD   Today's date:   01/16/2022 LOS: 7 days   Shift Summary:                                                              Pt A&O X4. SCD's on throughout the shift, IS encouraged. Small BM today. Dilaudid given X1. Hourly rounds completed this shift.        Provider Notifications:        Rapid Response Notifications:  Mobility:      PMP Activity: Step 7 - Walks out of Room (01/16/2022 10:10 AM)     Weight tracking:  Family Dynamic:   No data found.          Last Bowel Movement   Last BM Date: 01/16/22        Problem: Compromised Tissue integrity  Goal: Damaged tissue is healing and protected  Description: Interventions:  1. Monitor/assess Braden scale every shift  2. Provide wound care per wound care algorithm  3. Reposition patient every 2 hours and as needed unless able to reposition self  4. Increase activity as tolerated/progressive mobility  5. Relieve pressure to bony prominences for patients at moderate and high risk  6. Avoid shearing injuries   7. Keep intact skin clean and dry  8. Use bath wipes, not soap and water, for daily bathing   9. Use incontinence wipes for cleaning urine, stool and caustic drainage; Foley care as needed   10. Monitor external devices/tubes for correct placement to prevent pressure, friction and shearing   11. Encourage use of lotion/moisturizer on skin  12. Monitor patient's hygiene practices  13. Consult/collaborate with wound care nurse   14. Utilize specialty bed  15. Consider placing an indwelling catheter if incontinence interferes with healing of stage 3 or 4 pressure injury  Flowsheets (Taken 01/16/2022 0220 by Luz Brazen, RN)  Damaged tissue is healing and protected:   Monitor/assess Braden scale every shift   Reposition patient every 2 hours and as needed unless able to reposition self   Increase activity as tolerated/progressive  mobility   Relieve pressure to bony prominences for patients at moderate and high risk   Avoid shearing injuries   Keep intact skin clean and dry   Use incontinence wipes for cleaning urine, stool and caustic drainage. Foley care as needed   Monitor external devices/tubes for correct placement to prevent pressure, friction and shearing   Monitor patient's hygiene practices  Goal: Nutritional status is improving  Description: Interventions:  1. Assist patient with eating   2. Allow adequate time for meals   3. Encourage patient to take dietary supplement(s) as ordered   4. Collaborate with Clinical Nutritionist  5. Include patient/patient care companion in decisions related to nutrition  Flowsheets (Taken 01/16/2022 0220 by Luz Brazen, RN)  Nutritional status is improving:   Allow adequate time for meals   Include patient/patient care companion in decisions related to nutrition     Problem: Pain interferes with ability to perform ADL  Goal: Pain at adequate level as identified by patient  Description: Interventions:  1. Identify patient comfort function goal  2. Evaluate if patient comfort  function goal is met  3. Assess pain on admission, during daily assessment and/or before any "as needed" intervention(s)  4. Reassess pain within 30-60 minutes of any procedure/intervention, per Pain Assessment, Intervention, Reassessment (AIR) Cycle  5. Evaluate patient's satisfaction with pain management progress  6. Offer non-pharmacological pain management interventions  7. Consult/collaborate with Pain Service  8. Consult/collaborate with Physical Therapy, Occupational Therapy, and/or Speech Therapy  9. Assess for risk of opioid induced respiratory depression and side effects, including snoring/sleep apnea. Alert healthcare team of risk factors identified.  10. Include patient/patient care companion in decisions related to pain management as needed  Flowsheets (Taken 01/16/2022 0220 by Luz Brazen, RN)  Pain at adequate  level as identified by patient:   Identify patient comfort function goal   Assess for risk of opioid induced respiratory depression, including snoring/sleep apnea. Alert healthcare team of risk factors identified.   Assess pain on admission, during daily assessment and/or before any "as needed" intervention(s)   Reassess pain within 30-60 minutes of any procedure/intervention, per Pain Assessment, Intervention, Reassessment (AIR) Cycle   Evaluate if patient comfort function goal is met   Evaluate patient's satisfaction with pain management progress   Offer non-pharmacological pain management interventions   Include patient/patient care companion in decisions related to pain management as needed     Problem: Side Effects from Pain Analgesia  Goal: Patient will experience minimal side effects of analgesic therapy  Description: Interventions:  1. Monitor/assess patient's respiratory status (RR depth, effort, breath sounds)  2. Assess for changes in cognitive function   3. Prevent/manage side effects per LIP orders (i.e. nausea, vomiting, pruritus, constipation, urinary retention, etc.)  4. Evaluate for opioid-induced sedation with appropriate assessment tool (i.e. POSS)  Flowsheets (Taken 01/16/2022 0220 by Luz Brazen, RN)  Patient will experience minimal side effects of analgesic therapy:   Monitor/assess patient's respiratory status (RR depth, effort, breath sounds)   Assess for changes in cognitive function   Prevent/manage side effects per LIP orders (i.e. nausea, vomiting, pruritus, constipation, urinary retention, etc.)   Evaluate for opioid-induced sedation with appropriate assessment tool (i.e. POSS)     Problem: Altered GI Function  Goal: Fluid and electrolyte balance are achieved/maintained  Description: Interventions:  1. Monitor intake and output every shift  2. Monitor/assess lab values and report abnormal values  3. Provide adequate hydration  4. Assess for confusion/personality changes  5. Monitor  daily weight  6. Assess and reassess fluid and electrolyte status  7. Observe for seizure activity and initiate seizure precautions if indicated  8. Observe for cardiac arrhythmias  9. Monitor for muscle weakness  Flowsheets (Taken 01/16/2022 0220 by Luz Brazen, RN)  Fluid and electrolyte balance are achieved/maintained:   Monitor intake and output every shift   Monitor/assess lab values and report abnormal values   Provide adequate hydration   Assess for confusion/personality changes   Assess and reassess fluid and electrolyte status   Monitor for muscle weakness  Goal: Elimination patterns are normal or improving  Description: Interventions:  1.      Report abnormal assessment to physician  2.      Anticipate/assist with toileting needs  3.      Assess for normal bowel sounds  4.      Monitor for abdominal distension  5.      Monitor for abdominal discomfort  6.      Assess for signs and symptoms of bleeding.  Report signs of bleeding to physician   7.  Administer treatments as ordered  8.      Consult/collaborate with Clinical Nutritionist  9.      Assess for flatus  10.   Assess for and discuss C. diff screening with LIP  11.   Collaborate with LIP for containment device  12.   Reinforce education on foods that improve and complicate bowel movements and how activity and medications can affect bowel movements  13.   Administer medications to improve bowel evacuation as prescribed  14.   Encourage/perform oral hygiene as appropriate  Flowsheets (Taken 01/16/2022 0220 by Luz Brazen, RN)  Elimination patterns are normal or improving:   Report abnormal assessment to physician   Assess for normal bowel sounds   Monitor for abdominal discomfort   Administer treatments as ordered   Assess for flatus   Administer medications to improve bowel evacuation as prescribed   Anticipate/assist with toileting needs   Monitor for abdominal distension   Assess for signs and symptoms of bleeding.  Report signs of  bleeding to physician   Reinforce education on foods that improve and complicate bowel movements and how activity and medications can affect bowel movements   Encourage /perform oral hygiene as appropriate  Goal: Nutritional intake is adequate  Description: Interventions:  1. Monitor daily weights  2. Assist patient with meals/food selection  3. Allow adequate time for meals  4. Encourage/perform oral hygiene as appropriate   5. Encourage/administer dietary supplements as ordered (i.e. tube feed, TPN, oral, OGT/NGT, supplements)  6. Consult/collaborate with Clinical Nutritionist  7. Include patient/patient care companion in decisions related to nutrition  8. Assess anorexia, appetite, and amount of meal/food tolerated  9. Consult/collaborate with Speech Therapy (swallow evaluations)  Flowsheets (Taken 01/16/2022 0220 by Luz Brazen, RN)  Nutritional intake is adequate:   Assist patient with meals/food selection   Allow adequate time for meals   Encourage/perform oral hygiene as appropriate   Include patient/patient care companion in decisions related to nutrition   Assess anorexia, appetite, and amount of meal/food tolerated     Problem: Moderate/High Fall Risk Score >5  Description: Fall Risk Score > 5  Goal: Patient will remain free of falls  Flowsheets (Taken 01/16/2022 0800)  VH Moderate Risk (6-13):   ALL REQUIRED LOW INTERVENTIONS   INITIATE YELLOW "FALL RISK" SIGNAGE   YELLOW "FALL RISK" ARM BAND   YELLOW NON-SKID SLIPPERS   PLACE FALL RISK LEVEL ON WHITE BOARD FOR COMMUNICATION PURPOSES IN PATIENT'S ROOM   Remain with patient during toileting   Include family/significant other in multidisciplinary discussion regarding plan of care as appropriate   Use assistive devices     Problem: Safety  Goal: Patient will be free from injury during hospitalization  Description: Interventions:  1. Assess patient's risk for falls and implement fall prevention plan of care per policy      2. Provide and maintain safe  environment   3. Use appropriate transfer methods   4. Ensure appropriate safety devices are available at the bedside   5. Include patient/ family/ care giver in decisions related to safety   6. Hourly rounding   7. Assess for patients risk for elopement and implement Elopement Risk Plan per policy   8. Provide alternative method of communication if needed [employer], writing)  Flowsheets (Taken 01/16/2022 1048)  Patient will be free from injury during hospitalization:   Assess patient's risk for falls and implement fall prevention plan of care per policy   Provide and maintain safe environment  Use appropriate transfer methods   Ensure appropriate safety devices are available at the bedside   Include patient/ family/ care giver in decisions related to safety   Hourly rounding   Assess for patients risk for elopement and implement Elopement Risk Plan per policy  Goal: Patient will be free from infection during hospitalization  Description: Interventions:  1. Assess and monitor for signs and symptoms of infection  2. Monitor lab/diagnostic results  3. Monitor all insertion sites (i.e. indwelling lines, tubes, urinary catheters, and drains)  4. Encourage patient and family to use good hand hygiene technique  Flowsheets (Taken 01/16/2022 1048)  Free from Infection during hospitalization:   Assess and monitor for signs and symptoms of infection   Monitor lab/diagnostic results   Monitor all insertion sites (i.e. indwelling lines, tubes, urinary catheters, and drains)   Encourage patient and family to use good hand hygiene technique

## 2022-01-16 NOTE — Plan of Care (Signed)
Problem: Compromised Tissue integrity  Goal: Damaged tissue is healing and protected  Outcome: Progressing  Goal: Nutritional status is improving  Outcome: Progressing     Problem: Pain interferes with ability to perform ADL  Goal: Pain at adequate level as identified by patient  Outcome: Progressing     Problem: Side Effects from Pain Analgesia  Goal: Patient will experience minimal side effects of analgesic therapy  Outcome: Progressing     Problem: Altered GI Function  Goal: Fluid and electrolyte balance are achieved/maintained  Outcome: Progressing  Goal: Elimination patterns are normal or improving  Outcome: Progressing  Goal: Nutritional intake is adequate  Outcome: Progressing  Goal: Mobility/Activity is maintained at optimal level for patient  Outcome: Progressing     Problem: Moderate/High Fall Risk Score >5  Goal: Patient will remain free of falls  Outcome: Progressing     Problem: Safety  Goal: Patient will be free from injury during hospitalization  Outcome: Progressing  Goal: Patient will be free from infection during hospitalization  Outcome: Progressing

## 2022-01-16 NOTE — Plan of Care (Incomplete)
NURSE NOTE SUMMARY  [facility] - 4TH SURGICAL   Patient Name: Nicole Sutton   Attending Physician: Solon Palm, MD   Today's date:   01/16/2022 LOS: 7 days   Shift Summary:                                                                   Provider Notifications:        Rapid Response Notifications:  Mobility:      PMP Activity: Step 7 - Walks out of Room (01/16/2022 10:10 AM)     Weight tracking:  Family Dynamic:   No data found. {Family Dynamics (Optional):[id number]::"No special circumstances."}         Last Bowel Movement   Last BM Date: 01/16/22        Problem: Compromised Tissue integrity  Goal: Damaged tissue is healing and protected  Description: Interventions:  1. Monitor/assess Braden scale every shift  2. Provide wound care per wound care algorithm  3. Reposition patient every 2 hours and as needed unless able to reposition self  4. Increase activity as tolerated/progressive mobility  5. Relieve pressure to bony prominences for patients at moderate and high risk  6. Avoid shearing injuries   7. Keep intact skin clean and dry  8. Use bath wipes, not soap and water, for daily bathing   9. Use incontinence wipes for cleaning urine, stool and caustic drainage; Foley care as needed   10. Monitor external devices/tubes for correct placement to prevent pressure, friction and shearing   11. Encourage use of lotion/moisturizer on skin  12. Monitor patient's hygiene practices  13. Consult/collaborate with wound care nurse   14. Utilize specialty bed  15. Consider placing an indwelling catheter if incontinence interferes with healing of stage 3 or 4 pressure injury  Flowsheets (Taken 01/16/2022 0220 by Luz Brazen, RN)  Damaged tissue is healing and protected:   Monitor/assess Braden scale every shift   Reposition patient every 2 hours and as needed unless able to reposition self   Increase activity as tolerated/progressive mobility   Relieve pressure to bony prominences for patients  at moderate and high risk   Avoid shearing injuries   Keep intact skin clean and dry   Use incontinence wipes for cleaning urine, stool and caustic drainage. Foley care as needed   Monitor external devices/tubes for correct placement to prevent pressure, friction and shearing   Monitor patient's hygiene practices  Goal: Nutritional status is improving  Description: Interventions:  1. Assist patient with eating   2. Allow adequate time for meals   3. Encourage patient to take dietary supplement(s) as ordered   4. Collaborate with Clinical Nutritionist  5. Include patient/patient care companion in decisions related to nutrition  Flowsheets (Taken 01/16/2022 0220 by Luz Brazen, RN)  Nutritional status is improving:   Allow adequate time for meals   Include patient/patient care companion in decisions related to nutrition     Problem: Pain interferes with ability to perform ADL  Goal: Pain at adequate level as identified by patient  Description: Interventions:  1. Identify patient comfort function goal  2. Evaluate if patient comfort function goal is met  3. Assess pain on admission, during daily assessment and/or before any "as needed" intervention(s)  4. Reassess pain within 30-60 minutes of any procedure/intervention, per Pain Assessment, Intervention, Reassessment (AIR) Cycle  5. Evaluate patient's satisfaction with pain management progress  6. Offer non-pharmacological pain management interventions  7. Consult/collaborate with Pain Service  8. Consult/collaborate with Physical Therapy, Occupational Therapy, and/or Speech Therapy  9. Assess for risk of opioid induced respiratory depression and side effects, including snoring/sleep apnea. Alert healthcare team of risk factors identified.  10. Include patient/patient care companion in decisions related to pain management as needed  Flowsheets (Taken 01/16/2022 0220 by Luz BrazenKujala, Natalie, RN)  Pain at adequate level as identified by patient:   Identify patient comfort  function goal   Assess for risk of opioid induced respiratory depression, including snoring/sleep apnea. Alert healthcare team of risk factors identified.   Assess pain on admission, during daily assessment and/or before any "as needed" intervention(s)   Reassess pain within 30-60 minutes of any procedure/intervention, per Pain Assessment, Intervention, Reassessment (AIR) Cycle   Evaluate if patient comfort function goal is met   Evaluate patient's satisfaction with pain management progress   Offer non-pharmacological pain management interventions   Include patient/patient care companion in decisions related to pain management as needed     Problem: Side Effects from Pain Analgesia  Goal: Patient will experience minimal side effects of analgesic therapy  Description: Interventions:  1. Monitor/assess patient's respiratory status (RR depth, effort, breath sounds)  2. Assess for changes in cognitive function   3. Prevent/manage side effects per LIP orders (i.e. nausea, vomiting, pruritus, constipation, urinary retention, etc.)  4. Evaluate for opioid-induced sedation with appropriate assessment tool (i.e. POSS)  Flowsheets (Taken 01/16/2022 0220 by Luz BrazenKujala, Natalie, RN)  Patient will experience minimal side effects of analgesic therapy:   Monitor/assess patient's respiratory status (RR depth, effort, breath sounds)   Assess for changes in cognitive function   Prevent/manage side effects per LIP orders (i.e. nausea, vomiting, pruritus, constipation, urinary retention, etc.)   Evaluate for opioid-induced sedation with appropriate assessment tool (i.e. POSS)     Problem: Altered GI Function  Goal: Fluid and electrolyte balance are achieved/maintained  Description: Interventions:  1. Monitor intake and output every shift  2. Monitor/assess lab values and report abnormal values  3. Provide adequate hydration  4. Assess for confusion/personality changes  5. Monitor daily weight  6. Assess and reassess fluid and electrolyte  status  7. Observe for seizure activity and initiate seizure precautions if indicated  8. Observe for cardiac arrhythmias  9. Monitor for muscle weakness  Flowsheets (Taken 01/16/2022 0220 by Luz BrazenKujala, Natalie, RN)  Fluid and electrolyte balance are achieved/maintained:   Monitor intake and output every shift   Monitor/assess lab values and report abnormal values   Provide adequate hydration   Assess for confusion/personality changes   Assess and reassess fluid and electrolyte status   Monitor for muscle weakness  Goal: Elimination patterns are normal or improving  Description: Interventions:  1.      Report abnormal assessment to physician  2.      Anticipate/assist with toileting needs  3.      Assess for normal bowel sounds  4.      Monitor for abdominal distension  5.      Monitor for abdominal discomfort  6.      Assess for signs and symptoms of bleeding.  Report signs of bleeding to physician   7.      Administer treatments as ordered  8.      Consult/collaborate with Clinical Nutritionist  9.      Assess for flatus  10.   Assess for and discuss C. diff screening with LIP  11.   Collaborate with LIP for containment device  12.   Reinforce education on foods that improve and complicate bowel movements and how activity and medications can affect bowel movements  13.   Administer medications to improve bowel evacuation as prescribed  14.   Encourage/perform oral hygiene as appropriate  Flowsheets (Taken 01/16/2022 0220 by Luz Brazen, RN)  Elimination patterns are normal or improving:   Report abnormal assessment to physician   Assess for normal bowel sounds   Monitor for abdominal discomfort   Administer treatments as ordered   Assess for flatus   Administer medications to improve bowel evacuation as prescribed   Anticipate/assist with toileting needs   Monitor for abdominal distension   Assess for signs and symptoms of bleeding.  Report signs of bleeding to physician   Reinforce education on foods that improve  and complicate bowel movements and how activity and medications can affect bowel movements   Encourage /perform oral hygiene as appropriate  Goal: Nutritional intake is adequate  Description: Interventions:  1. Monitor daily weights  2. Assist patient with meals/food selection  3. Allow adequate time for meals  4. Encourage/perform oral hygiene as appropriate   5. Encourage/administer dietary supplements as ordered (i.e. tube feed, TPN, oral, OGT/NGT, supplements)  6. Consult/collaborate with Clinical Nutritionist  7. Include patient/patient care companion in decisions related to nutrition  8. Assess anorexia, appetite, and amount of meal/food tolerated  9. Consult/collaborate with Speech Therapy (swallow evaluations)  Flowsheets (Taken 01/16/2022 0220 by Luz Brazen, RN)  Nutritional intake is adequate:   Assist patient with meals/food selection   Allow adequate time for meals   Encourage/perform oral hygiene as appropriate   Include patient/patient care companion in decisions related to nutrition   Assess anorexia, appetite, and amount of meal/food tolerated     Problem: Moderate/High Fall Risk Score >5  Description: Fall Risk Score > 5  Goal: Patient will remain free of falls  Flowsheets (Taken 01/16/2022 0800)  VH Moderate Risk (6-13):   ALL REQUIRED LOW INTERVENTIONS   INITIATE YELLOW "FALL RISK" SIGNAGE   YELLOW "FALL RISK" ARM BAND   YELLOW NON-SKID SLIPPERS   PLACE FALL RISK LEVEL ON WHITE BOARD FOR COMMUNICATION PURPOSES IN PATIENT'S ROOM   Remain with patient during toileting   Include family/significant other in multidisciplinary discussion regarding plan of care as appropriate   Use assistive devices     Problem: Safety  Goal: Patient will be free from injury during hospitalization  Description: Interventions:  1. Assess patient's risk for falls and implement fall prevention plan of care per policy      2. Provide and maintain safe environment   3. Use appropriate transfer methods   4. Ensure  appropriate safety devices are available at the bedside   5. Include patient/ family/ care giver in decisions related to safety   6. Hourly rounding   7. Assess for patients risk for elopement and implement Elopement Risk Plan per policy   8. Provide alternative method of communication if needed [employer], writing)  Flowsheets (Taken 01/16/2022 1048)  Patient will be free from injury during hospitalization:   Assess patient's risk for falls and implement fall prevention plan of care per policy   Provide and maintain safe environment   Use appropriate transfer methods   Ensure appropriate safety devices are available at the bedside  Include patient/ family/ care giver in decisions related to safety   Hourly rounding   Assess for patients risk for elopement and implement Elopement Risk Plan per policy  Goal: Patient will be free from infection during hospitalization  Description: Interventions:  1. Assess and monitor for signs and symptoms of infection  2. Monitor lab/diagnostic results  3. Monitor all insertion sites (i.e. indwelling lines, tubes, urinary catheters, and drains)  4. Encourage patient and family to use good hand hygiene technique  Flowsheets (Taken 01/16/2022 1048)  Free from Infection during hospitalization:   Assess and monitor for signs and symptoms of infection   Monitor lab/diagnostic results   Monitor all insertion sites (i.e. indwelling lines, tubes, urinary catheters, and drains)   Encourage patient and family to use good hand hygiene technique

## 2022-01-16 NOTE — Progress Notes (Signed)
Quick Doc  [facility] - 4TH SURGICAL   Patient Name: [facility]   Attending Physician: Solon Palm, MD   Today's date:   01/16/2022 LOS: 7 days   Expected Discharge Date      Quick  Assessment:                                                              ReAdmit Risk Score: 11.07    CM Comments: 01/16/22 LPN DCP (AR) Patient progressing. IVF. Full liquid diet. Ambulating in hallway with walker. PT/OT recommending HHPT. No F2F order in at this time. CM will continue to follow.                                                                                       Provider Notifications:      Luna Glasgow LPN DCP  Discharge Planner 4th/2nd  Ext: [phone]

## 2022-01-16 NOTE — Discharge Instr - AVS First Page (Addendum)
Diet:  Low residue diet x 2 weeks then advance as tolerated to previous diet.    Activity:  No strenuous activity or lifting greater than 10-15 pounds x 4 weeks.  Encourage ambulation as tolerated.  May use stairs if needed.  No working or driving while on narcotic pain medications.    Medications:  Refer to the After Visit Summary for updates and new medications.  Take over-the-counter  Tylenol (aka acetaminophen) as needed for mild to moderate pain or headache.  If you are taking the prescribed medication Mobic (meloxicam) or Celebrex (celecoxib), DO NOT take additional over-the-counter non-steroidal medications (NSAIDs) such as Ibuprofen/Advil/Motrin or Naproxen/Naprosyn/Aleve.  If you develop stomach upset or burning while taking NSAIDs such as Ibuprofen (Advil, Motrin), Naproxen (Naprosyn/Aleve), Mobic (meloxicam) or Celebrex (celecoxib) then begin taking an over-the-counter antacid pill daily.  If the symptoms do not resolve after 2-3 days then discontinue the NSAID.  Use over-the-counter stool softeners and/or Senokot daily to twice daily as needed for constipation.  Obtain over-the-counter lidocaine patches if they are helpful for pain relief.  Apply to area of pain/fractures daily for 12 hours on and remove the next 12 hours.    Wound/Drain Care:  It is OK to shower and pat dry.    Instructions:  Call/Return if temperature over 101.0, redness or purulent (pus) drainage or warmth to your incision(s), refractory nausea/vomiting, increased pain not relieved by medications, or new onset of chest pain or shortness or breath.    For additional questions or concerns after your discharge, you may contact the Trauma Acute Care Surgery (TACS) Office with [facility] at [phone] between the hours of 8:00am - 4:30pm.  Any medication refill requests should be called to the office during daytime business hours.  If you have an urgent need that occurs after office hours, call [phone] and ask to speak to  the Trauma Acute Care Surgery [facility]) physician on call.  No medication refills will be handled outside of business hours.  Emergencies should go to the Emergency Room and cannot be handled over the phone.    Trauma Acute Care Surgery (TACS) BOWEL MAINTENANCE GUIDELINES    The use of narcotic pain medications and decreased activity will lead to constipation.  In order to prevent this from happening to you, please follow these recommendations:  Drink adequate amounts of fluid throughout the day.    Take an over-the-counter stool softener, such as Colace, once or twice a day depending on the firmness of your stools.  You may take another over-the-counter stool softener, such as Senokot or generic Senna, two tablets at bedtime.  Add Metamucil or other similar fiber supplement to your diet twice a day.    If constipation persists:   Add Milk of Magnesia two (2) tablespoons (30mL) every 6    hours (up to 8 doses).  If you are experiencing rectal fullness or pressure, try a Dulcolax and/or   glycerin suppository every 12 hours.    Avoid anti-diarrhea medications once your bowels begin to move.  Loose         stools should resolve on their own after 2-3 days.  Gas relief medications such as Gas-X or Gaviscon are acceptable for use.    Probiotic food products, such as yogurt, are acceptable especially if you are or have been on antibiotics.    Additional Recipe for Success (Natural Stool Softener):    -1 cup of unsweetened applesauce,   - cup of unsweetened prune juice,   -1  cup of unprocessed bran (ex., Miller's brand - can be found at  [employer], [employer],        [employer], or other health food stores).    Mix together and store in refrigerator.    Take 2 tablespoons with a large glass of water daily and expect results 6-8 hours later.  You may add 1 tablespoon every 5-7 days if needed.   *can be used as long as desired, non-addictive, & gentle

## 2022-01-16 NOTE — Progress Notes (Signed)
MOBILITY NOTE:     Precautions: Weight Bearing: Full weight bearing     Activity:  Ambulated in the hall x2000 feet     Level of Assistance: A little     Device(s) used: Wheeled walker     Positioning frequency: Turns self as needed    Last Vitals: BP 136/74   Pulse 88   Temp 97.2 F (36.2 C) (Temporal)   Resp 16   Ht 1.702 m (5\' 7" )   Wt 75.3 kg (166 lb 0.1 oz)   SpO2 97%   BMI 26.00 kg/m       Pt ready to go for a walk. C/o right knee pain and abd pain, no nausea or SOB. Offered cues to take it slow and steady. Pt back in room, sitting  up in recliner. Bed side table and call bell within reach. RN notified.

## 2022-01-17 MED ORDER — CELECOXIB 100 MG PO CAPS
100.0000 mg | ORAL_CAPSULE | Freq: Two times a day (BID) | ORAL | 0 refills | Status: AC
Start: 2022-01-17 — End: ?

## 2022-01-17 MED ORDER — HYDROMORPHONE HCL 2 MG PO TABS
2.0000 mg | ORAL_TABLET | Freq: Four times a day (QID) | ORAL | 0 refills | Status: AC | PRN
Start: 2022-01-17 — End: 2022-01-24

## 2022-01-17 MED ORDER — CYCLOBENZAPRINE HCL 5 MG PO TABS
5.0000 mg | ORAL_TABLET | Freq: Three times a day (TID) | ORAL | 0 refills | Status: AC
Start: 2022-01-17 — End: ?

## 2022-01-17 NOTE — Progress Notes (Signed)
DAILY PROGRESS NOTE - Trauma Acute Care Surgery (TACS)   Name:  Nicole Sutton,Nicole Sutton     DOB:  1950-07-04   MR#:  [id number]               ROOM: 447/447-A    DATE:  01/17/22      Principal Diagnosis:  Small bowel obstruction due to adhesions    Refer to below for diagnoses being addressed for this encounter       ASSESSMENT & PLAN:                                                              Hospital Day: 9    s/p lapatoscopy converted to laparotomy, small bowel resection and lysis of adhesions 8/7    CONDITION:  gradually improving    Patient Active Hospital Problem List:       Small bowel obstruction due to adhesions (12/23/2021)           Assessment: tolerating POS, passing flatus, having BM        Plan:low fiber diet, discharge today         Gastroesophageal reflux disease (12/24/2021)           Assessment: chronic, stable        Plan: famotidine.  No home meds listed.         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  ROBF  24-hour interval history:  dispo planning    Meds:         Infusions:         DVT Prophylaxis:  enoxaparin 40 mg SQ Daily      Foley:  No          GI Prophylaxis:  Yes      BM last 24 Hours:  No   Bowel Regimen:  Yes  Diet:  No diet orders on file     I & O's:  Data reviewed UO - 250  stool x1  Pain: well controlled  EXAM:   Vitals:  Blood pressure 127/68, pulse 70, temperature 97.5 F (36.4 C), temperature source Temporal, resp. rate 18, height 1.702 m (5\' 7" ), weight 75.3 kg (166 lb 0.1 oz), SpO2 99 %.   General:   in no apparent distress, appears  comfortable, non-toxic  Pulmonary:   lungs clear to ausculation, equal breath sounds, unlabored respirations  Cardiac:   regular rate and rhythm  Abdomen:   active bowel sounds, soft, dressing is removed and left off  Neuro:   alert, speech normal, oriented x 3, no gross focal neurologic deficits, moves all extremities well  Psych:   cooperative and calm  Extremities:   no edema  Skin:   normothermic    Lab Data Reviewed:  no    Cultures:    Microbiology Results (last 15 days)       Procedure Component Value Units Date/Time    Multi-Specimen Fungal Culture and Smear [[id number]] Collected: 01/09/22 2148    Order Status: Completed Specimen: Peritoneal Fluid Updated: 01/10/22 0948     Fungal Smear No Fungal Elements Seen    Sterile Body Fluid Culture and Smear [[id number]] Collected: 01/09/22 2148    Order Status: Completed Specimen: Peritoneal Fluid Updated: 01/12/22 0909     Gram Stain --     No Epithelial Cells  No WBC's Seen  No Organisms Seen  Specimen performed by cytocentrifugation methodology       Culture Result No Growth Final    Anaerobic Culture [[id number]] Collected: 01/09/22 2148    Order Status: Completed Specimen: Peritoneal Fluid Updated: 01/13/22 0927     Culture Result No Anaerobes Isolated               CHEM:     Recent Labs   Lab 01/16/22  0427 01/15/22  0522 01/14/22  0450 01/13/22  0407 01/12/22  0420   Glucose 88 89 86 71 65*   Sodium 141 141 136 135* 138   Potassium 4.0 3.9 3.6 3.9 3.8   Chloride 109 111* 108 107 109   CO2 24 24 24 20 23    BUN 8 7 13 19 21    Creatinine 0.71 0.76 0.75 0.79 0.84   Calcium 8.8 8.6 8.4* 8.4* 8.1*   Magnesium 1.8 1.7 1.9 1.9 1.9   Phosphorus 3.0 2.5 2.4 2.5 3.1       CBC:       Recent Labs   Lab 01/16/22  0427 01/15/22  0522 01/14/22  0450 01/13/22  0407 01/12/22  0420   WBC 5.1 5.3 5.4 6.2 6.5   Hemoglobin 9.2* 9.1* 9.0* 8.9* 8.6*   Hematocrit 29.8* 29.1* 28.7* 29.2* 27.2*   PLT CT 251 246 245 242 242   MCV 105* 104* 104* 105* 104*       BANDS:      POCT:      LFTs:             COAG:      Lactate:            Radiology:   No results found.

## 2022-01-17 NOTE — Nursing Progress Note (Addendum)
NURSE NOTE SUMMARY  [facility] - 4TH SURGICAL   [facility]   Attending Physician: Solon Palm, MD   Today's date:   01/17/2022 LOS: 8 days   Shift Summary:                                                              Alert & oriented x4. VSS. Medications administered per MAR. Adequate UOP this shift. Patient experiencing headache this morning, scheduled tylenol given. Denies further needs at this time. Call bell within reach.      Provider Notifications:        Rapid Response Notifications:  Mobility:      PMP Activity: Step 7 - Walks out of Room (01/16/2022  9:20 PM)     Weight tracking:  Family Dynamic:   No data found.          Last Bowel Movement   Last BM Date: 01/16/22

## 2022-01-17 NOTE — Progress Notes (Signed)
Nutrition Therapy  Nutrition Follow Up    Patient Information:     Name:Nicole Sutton   Age: 71 y.o.   Sex: female     MRN: [id number]      Recommendation:     Low fiber diet     Nutrition Assessment:     Pt is s/p exploratory laparotomy with SBR, LOA on 8/7. Pt is having return of bowel function. Diet advanced, tolerating solids without N/V per report.       Nutrition Risk Level: Moderate    Nutrition Diagnosis:     Inadequate oral intake - improving      Monitoring:  Evaluation:    PO/EN/PN intake:  Amount of food    Labs:  Electrolyte Profile, Renal Profile, and Glucose, casual    GI Profile:  Bowel Function   Nutrition Focused Physical:  Skin       Assessment Data:     Height: 1.702 m (5\' 7" )   Weight: 75.3 kg (166 lb 0.1 oz)   BMI: Body mass index is 26 kg/m.     Pertinent Meds: reviewed  Pertinent Labs:  Recent Labs   Lab 01/16/22  0427 01/15/22  0522 01/14/22  0450 01/13/22  0407 01/12/22  0420   Sodium 141 141 136 135* 138   Potassium 4.0 3.9 3.6 3.9 3.8   Chloride 109 111* 108 107 109   CO2 24 24 24 20 23    BUN 8 7 13 19 21    Creatinine 0.71 0.76 0.75 0.79 0.84   Glucose 88 89 86 71 65*   Calcium 8.8 8.6 8.4* 8.4* 8.1*   Magnesium 1.8 1.7 1.9 1.9 1.9   Phosphorus 3.0 2.5 2.4 2.5 3.1   Osmolality Calculated 279 279 271* 271* 277       Diet Order:  Orders Placed This Encounter   Procedures    Diet low fiber        GI symptoms:    +BM   I/O:  reviewed   Skin: no PI noted     Estimated Needs:  Estimated Energy Needs  Total Energy Estimated Needs: 1525-1830  Method for Estimating Needs: 25-30 kcals/kg IBW 61 kg  Estimated Protein Needs  Total Protein Estimated Needs: 73-92  Method for Estimating Needs: 1.2-1.5 gm/kg IBW 61 kg       Additional Comments:       Markham Jordan, RDN  01/17/2022 11:42 AM

## 2022-01-17 NOTE — Progress Notes (Signed)
HOME HEALTH ASSESSMENT    Consulted to arrange [facility] PT/OT services for pt. Met patient at bedside, pt stated that she preferred outpatient PT instead of home health. Maudry Mayhew, CM. HH liaison will sign off, please re-consult if new needs arise.             Simonne Maffucci, RN BSN  [facility] Liaison   [phone]  [email or web address]

## 2022-01-17 NOTE — Progress Notes (Signed)
Quick Doc  [facility] [place] - 4TH SURGICAL   Patient Name: [facility]   Attending Physician: Solon Palm, MD   Today's date:   01/17/2022 LOS: 8 days   Expected Discharge Date      Quick  Assessment:                                                              ReAdmit Risk Score: 11.37    CM Comments: 01/17/22 LPN DCP (AR) Patient for discharge today. Patient declined HHPT services when approached by [facility] liaison. Patient stated she would rather have outpatient PT. Attending made aware of patient's wishes. No further needs identified for CM at this time.    Physical Discharge Disposition: Home                                   Mode of Transportation: Car     Patient/Family/POA notified of transfer plan: Yes  Patient agreeable to discharge plan/expected d/c date?: Yes  Family/POA agreeable to discharge plan/expected d/c date?: Yes  Bedside nurse notified of transport plan?: Yes                       Physical Discharge Disposition: Home       Provider Notifications:      Luna Glasgow LPN DCP  Discharge Planner 4th/2nd  Ext: [phone]

## 2022-01-17 NOTE — Plan of Care (Addendum)
NURSE NOTE SUMMARY  [facility] - 4TH SURGICAL   Patient Name: [facility]   Attending Physician: Solon Palm, MD   Today's date:   01/17/2022 LOS: 8 days   Shift Summary:                                                              9:19 AM: Pt tolerating PO solids diet with no c/o nausea. Abdomen soft, bowel sounds hypoactive, and she reports passing a small amount of liquid BM. Midline incision covered in dressing, which is clean dry and intact. No c/o pain. Ambulating independently.     1:21 PM: Pt discharged. IV removed, all instructions reviewed with pt and questions answered.    Provider Notifications:        Rapid Response Notifications:  Mobility:      PMP Activity: Step 7 - Walks out of Room (01/16/2022  9:20 PM)     Weight tracking:  Family Dynamic:   No data found.          Last Bowel Movement   Last BM Date: 01/16/22        Problem: Compromised Tissue integrity  Goal: Damaged tissue is healing and protected  Outcome: Progressing  Goal: Nutritional status is improving  Outcome: Progressing     Problem: Pain interferes with ability to perform ADL  Goal: Pain at adequate level as identified by patient  Outcome: Progressing     Problem: Side Effects from Pain Analgesia  Goal: Patient will experience minimal side effects of analgesic therapy  Outcome: Progressing     Problem: Altered GI Function  Goal: Fluid and electrolyte balance are achieved/maintained  Outcome: Progressing  Goal: Elimination patterns are normal or improving  Outcome: Progressing  Goal: Nutritional intake is adequate  Outcome: Progressing  Goal: Mobility/Activity is maintained at optimal level for patient  Outcome: Progressing     Problem: Moderate/High Fall Risk Score >5  Goal: Patient will remain free of falls  Outcome: Progressing     Problem: Safety  Goal: Patient will be free from injury during hospitalization  Outcome: Progressing  Goal: Patient will be free from infection during  hospitalization  Outcome: Progressing

## 2022-01-19 ENCOUNTER — Telehealth (INDEPENDENT_AMBULATORY_CARE_PROVIDER_SITE_OTHER): Payer: Self-pay

## 2022-01-19 NOTE — Telephone Encounter (Signed)
Transition of Care Contact Information  Discharge Date: 01/17/2022  Transition Facility Type--Hospital (Inpatient or Observation)  [place]): Completed or attempted contact indicated by Date/Time  Completed Contact: 01/19/2022  3:41 PM  Contact Method(s)-- Patient/Caregiver Telephone  Clinical Staff Name/Role who contacted--MW  Transition Assessment  Discharge Summary obtained?--Yes  How are you recovering?--Improving  Discharge Meds obtained?--Yes  Medications reconcilled with Discharge Documentation?  Medication understanding --knows new medication(s)--knows purpose of medication  Medication Concerns?--No  Have everything needed for recovery?--Yes  Care Coordination:   Patient has transition follow-up appointment date and time?--Yes  Primary Care Transition Visit planned?--Yes  Specialist Transition Visit planned?--Yes  Patient/caregiver plans to attend transition visit?--Yes  Primary Follow-up Barrier  Interventions provided --follow-up appointment date/time reinforced  Home Health or DME ordered at discharge?--No  Clinician/Team notified?--No  Primary reason clinician notified?  Transition Note:  Spoke to patient to f/u from recent discharge from [facility]. Patient reports she is "doing o.K." Reviewed discharge meds with patient. No questions noted about meds. Patient confirmed she has f/u appt with PCP and plans to attend. Patient states she is with family and they are helping her recover. Patient denied any questions/concerns at present.   Clabe Seal, RN    Further information may be documented in relevant telephone or outreach encounter.

## 2022-01-23 ENCOUNTER — Encounter (INDEPENDENT_AMBULATORY_CARE_PROVIDER_SITE_OTHER): Payer: Self-pay | Admitting: Primary Care

## 2022-01-23 ENCOUNTER — Inpatient Hospital Stay
Admission: RE | Admit: 2022-01-23 | Discharge: 2022-01-23 | Disposition: A | Discharge status: Home / Self Care | Payer: Medicare Other | Source: Ambulatory Visit | Attending: Primary Care | Admitting: Primary Care

## 2022-01-23 ENCOUNTER — Other Ambulatory Visit: Payer: Self-pay

## 2022-01-23 ENCOUNTER — Ambulatory Visit (INDEPENDENT_AMBULATORY_CARE_PROVIDER_SITE_OTHER): Payer: Medicare Other | Admitting: Primary Care

## 2022-01-23 VITALS — BP 110/80 | HR 84 | Temp 97.1°F | Ht 67.0 in | Wt 161.8 lb

## 2022-01-23 DIAGNOSIS — Z09 Encounter for follow-up examination after completed treatment for conditions other than malignant neoplasm: Secondary | ICD-10-CM

## 2022-01-23 DIAGNOSIS — R109 Unspecified abdominal pain: Secondary | ICD-10-CM | POA: Insufficient documentation

## 2022-01-23 DIAGNOSIS — C785 Secondary malignant neoplasm of large intestine and rectum: Secondary | ICD-10-CM

## 2022-01-23 NOTE — Progress Notes (Unsigned)
INTERNAL MEDICINE, [facility]  [address]  [place] [address]    Transitional Care Management Note    Name: [name] MRN:  [id number]   Date: 01/23/2022 Age: 71 y.o.     Chief Complaint: Hospital Follow Up (Bowel obstruction    no bowel movement since discharge  last Thursday.  Not feeling well ) and Hospital Discharge Transition       SUBJECTIVE:  [name] is a 71 y.o. female presenting today for follow-up after being discharged. The main problem requiring admission was ***.       OBJECTIVE:   BP 110/80   Pulse 84   Temp 36.2 C (97.1 F) (Thermal Scan)   Ht 1.702 m ('5\' 7"'$ )   Wt 73.4 kg (161 lb 12.8 oz)   SpO2 98%   BMI 25.34 kg/m          Transition of Care Contact Information  Discharge date: Discharge Date: 01/17/2022  Transition Facility Type--Hospital (Inpatient or Observation)  [place]):  Completed Contact: 01/19/2022  3:41 PM  Contact Method(s)-- Patient/Caregiver Telephone  Clinical Staff Name/Role who contacted--MW     Data Reviewed  Medication Reconciliation completed    Assessment and Plan    ICD-10-CM    1. Acute abdominal pain  R10.9 CT ABDOMEN PELVIS WO IV CONTRAST        {Other transition actions (Optional) -:210010006}    No follow-ups on file.    Shan Levans, DNP,FNP-C

## 2022-01-23 NOTE — Result Encounter Note (Signed)
Please inform patient CT abdomen shows no bowel obstruction but does show a large amount of retained stool in the colon would benefit greatly from laxative treatment.  Please have the patient call me she has any questions.

## 2022-01-23 NOTE — Nursing Note (Signed)
BP 110/80   Pulse 84   Temp 36.2 C (97.1 F) (Thermal Scan)   Ht 1.702 m ('5\' 7"'$ )   Wt 73.4 kg (161 lb 12.8 oz)   SpO2 98%   BMI 25.34 kg/m     Vernon Prey, CMA

## 2022-01-24 ENCOUNTER — Encounter (INDEPENDENT_AMBULATORY_CARE_PROVIDER_SITE_OTHER): Payer: Self-pay | Admitting: Primary Care

## 2022-01-24 ENCOUNTER — Telehealth (INDEPENDENT_AMBULATORY_CARE_PROVIDER_SITE_OTHER): Payer: Self-pay

## 2022-01-24 ENCOUNTER — Telehealth (INDEPENDENT_AMBULATORY_CARE_PROVIDER_SITE_OTHER): Payer: Self-pay | Admitting: Family

## 2022-01-24 ENCOUNTER — Other Ambulatory Visit (INDEPENDENT_AMBULATORY_CARE_PROVIDER_SITE_OTHER): Payer: Self-pay | Admitting: Family

## 2022-01-24 MED ORDER — LACTULOSE 10 GM/15ML PO SOLN
20.0000 g | Freq: Every day | ORAL | 0 refills | Status: AC
Start: 2022-01-24 — End: 2022-01-29
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
## 2022-01-24 NOTE — Telephone Encounter (Signed)
PT reports she has only had 2 small walnut size stools since D/C. PCP had ordered CT yesterday. PT would like a call back to discuss.

## 2022-01-24 NOTE — Telephone Encounter (Signed)
Discussed with patient she is having constipation, sent pharmacy rx for Lactulose, closer to sisters house where she is staying. Attempted to call daughter,did not leave message.

## 2022-01-24 NOTE — Telephone Encounter (Signed)
Called pt will have CT sent to TACS this afternoon

## 2022-01-26 ENCOUNTER — Ambulatory Visit (INDEPENDENT_AMBULATORY_CARE_PROVIDER_SITE_OTHER): Payer: Medicare PPO | Admitting: Nurse Practitioner

## 2022-01-26 ENCOUNTER — Other Ambulatory Visit
Admission: RE | Admit: 2022-01-26 | Discharge: 2022-01-26 | Disposition: A | Discharge status: Home / Self Care | Payer: Medicare PPO | Source: Ambulatory Visit

## 2022-01-26 ENCOUNTER — Ambulatory Visit
Admission: RE | Admit: 2022-01-26 | Discharge: 2022-01-26 | Disposition: A | Discharge status: Home / Self Care | Payer: Medicare PPO | Source: Ambulatory Visit | Attending: Nurse Practitioner | Admitting: Nurse Practitioner

## 2022-01-26 ENCOUNTER — Inpatient Hospital Stay: Admission: RE | Admit: 2022-01-26 | Payer: Self-pay | Source: Ambulatory Visit

## 2022-01-26 VITALS — BP 119/58 | HR 88 | Temp 97.7°F

## 2022-01-26 DIAGNOSIS — E639 Nutritional deficiency, unspecified: Secondary | ICD-10-CM | POA: Insufficient documentation

## 2022-01-26 DIAGNOSIS — K5900 Constipation, unspecified: Secondary | ICD-10-CM

## 2022-01-26 NOTE — Progress Notes (Signed)
119/68 

## 2022-01-27 LAB — BASIC METABOLIC PANEL
Anion Gap: 17.3 mMol/L (ref 7.0–18.0)
BUN / Creatinine Ratio: 29.4 Ratio (ref 10.0–30.0)
BUN: 25 mg/dL — ABNORMAL HIGH (ref 7–22)
CO2: 21 mMol/L (ref 20–30)
Calcium: 9.7 mg/dL (ref 8.5–10.5)
Chloride: 106 mMol/L (ref 98–110)
Creatinine: 0.85 mg/dL (ref 0.60–1.20)
EGFR: 73 mL/min/{1.73_m2} (ref 60–150)
Glucose: 93 mg/dL (ref 71–99)
Osmolality Calculated: 282 mOsm/kg (ref 275–300)
Potassium: 5.3 mMol/L (ref 3.5–5.3)
Sodium: 139 mMol/L (ref 136–147)

## 2022-01-27 LAB — MAGNESIUM: Magnesium: 2.2 mg/dL (ref 1.6–2.6)

## 2022-01-27 NOTE — Progress Notes (Signed)
Called patient to discuss labs and AXR after appointment yesterday.  Chem 8 fairly normal (BUN slightly elevated), but not concerned for dehydration at this time.  AXR demonstrates constipation with stool throughout colon.  Encouraged aggressive bowel regimen (oral and enemas or suppositories) and increased fluid intake.  Patient expressed understanding and will keep follow up in 2 weeks.

## 2022-01-31 NOTE — Progress Notes (Signed)
Tra[facility]) Clinic Note - General Postop    Patient Name: Nicole Sutton, Nicole Sutton     PCP: Sampson Goon, MD   DOB: [birth year]        MRN#: [id number]    Date: 01/26/2022     History of Present Illness:   71 y.o. female returns for first surgical follow-up.  She is s/p robot assisted diagnostic laparascopy and lysis of adhesions on 7/25 by Dr Marchia Bond and return to OR on 8/7 with Dr Nedra Hai for exploratory laparoscopy converted to laparotomy with small bowel resection and lysis of adhesions for internal hernia.    She denies fever, chills, vomiting, diarrhea.  Her appetite has been fair , feeling full quickly.  She is no longer using narcotic pain medications.  Her activity is decreased with some fatigue.   She also complains of constipation and mild abdominal soreness and bloating.    Medications:     Outpatient Medications Marked as Taking for the 01/26/22 encounter (Office Visit) with Theotis Burrow, NP   Medication Sig Dispense Refill    acetaminophen (TYLENOL) 325 MG tablet Take 2 tablets (650 mg) by mouth every 6 (six) hours      celecoxib (CeleBREX) 100 MG capsule Take 1 capsule (100 mg) by mouth every 12 (twelve) hours 28 capsule 0    cyclobenzaprine (FLEXERIL) 5 MG tablet Take 1 tablet (5 mg) by mouth every 8 (eight) hours 30 tablet 0    EPINEPHrine 0.3 MG/0.3ML Solution Auto-injector injection Inject 0.3 mLs (0.3 mg) into the muscle once as needed (anaphylaxis)      escitalopram (LEXAPRO) 20 MG tablet Take 0.5 tablets (10 mg) by mouth every morning Pt was only taking 5 mg at home as pt was titrating medication under dr orders.  She was initially given RX after husband's death      fluticasone (FLONASE) 50 MCG/ACT nasal spray 1 spray by Nasal route every morning      Galcanezumab-gnlm (Emgality) 120 MG/ML Solution Auto-injector Inject 1 mL into the skin every 30 (thirty) days      [EXPIRED] lactulose (CHRONULAC) 10 GM/15ML solution Take 30 mLs (20 g) by mouth daily for 5 days 150 mL 0     Mirabegron ER 50 MG Tablet SR 24 hr Take 1 tablet (50 mg) by mouth every morning      Omega-3 Fatty Acids (OMEGA-3 FISH OIL) 500 MG Cap Take 2 capsules (1,000 mg) by mouth every morning      plecanatide (Trulance) 3 MG tablet Take 1 tablet (3 mg) by mouth daily      Vitamin D3 (CHOLECALCIFEROL) 50 MCG (2000 UT) tablet Take 1 tablet (50 mcg) by mouth every morning         Physical Exam:   BP 119/58   Pulse 88   Temp 97.7 F (36.5 C)   SpO2 98%   General:  in no apparent distress, non-toxic  Pulm/Chest:  clear to auscultation bilaterally, equal breath sounds, no rales/rhonchi/wheezes  Cardiac:  regular rate and rhythm  Abdomen:  hypoactive bowel sounds, soft, mild incisional tenderness without guarding, mildly distended, midline staples intact with NO drainage or concern for infection.  Some staple insertion site erythema noted.  Staples removed by NP without difficulty, bacitracin applied to incision line after staple removal    Pathology/Labs-Cultures/Radiology:     Diagnosis 1. Mesenteric band, excision:          Benign fibroadipose tissue with fibrosis and mild acute and chronic  inflammation.     2. Mid jejunum, segmental resection:          Benign villiform small bowel with acute serositis and foreign body   reaction,              consistent with suture granuloma.    Comments No neoplasm is identified in the present specimens.       Assessment:     1. Constipation, unspecified constipation type    2. Inadequate oral nutritional intake       Postop Status:    Postoperative course complicated by abdominal pain, constipation, and poor intake    Plan:   Follow-up: Return in about 2 weeks (around 02/09/2022).     Medication Orders:    Additional Orders:  Orders Placed This Encounter   Procedures    XR Abdomen 2 View    Basic Metabolic Panel    Magnesium        Instructions:  Small frequent meals  Increase water intake as able  Bowel regimen  Obtain AXR and labs to monitor for dehydration, electrolyte  imbalance, constipation  Will call with AXR and labs results    Theotis Burrow, NP  01/31/2022 9:59 AM    The supervising physician for this clinic visit was Dr. Marchia Bond.

## 2022-02-07 ENCOUNTER — Encounter (INDEPENDENT_AMBULATORY_CARE_PROVIDER_SITE_OTHER): Payer: Self-pay

## 2022-02-07 ENCOUNTER — Ambulatory Visit (INDEPENDENT_AMBULATORY_CARE_PROVIDER_SITE_OTHER): Payer: Medicare PPO | Admitting: Physician Assistant

## 2022-02-07 VITALS — Ht 67.0 in | Wt 166.0 lb

## 2022-02-07 DIAGNOSIS — Z5189 Encounter for other specified aftercare: Secondary | ICD-10-CM

## 2022-02-07 NOTE — Patient Instructions (Addendum)
Call the GI office to follow up on the medication for constipation  No need to follow up further with our office  Wait 2 more weeks before attempting lifting greater than 15 lbs

## 2022-02-08 NOTE — Progress Notes (Signed)
Trauma Acute Care Surgery [facility](TACS) Clinic Note - General Postop    Patient Name: Nicole Sutton,Nicole Sutton     PCP: Sampson GoonBagree, Sarika V, MD   DOB: 1951/01/18        MRN#: [id number]    Date: 02/07/2022     History of Present Illness:   71 y.o. female returns for second surgical follow-up. She is s/p robot assisted diagnostic laparascopy and lysis of adhesions on 7/25 by Dr Marchia BondMayuiers and return to OR on 8/7 with Dr Nedra HaiLee for exploratory laparoscopy converted to laparotomy with small bowel resection and lysis of adhesions for internal hernia.    She denies fever, chills, sweats, nausea, vomiting, diarrhea.   Her appetite has been fair .    Her activity is decreased with some fatigue.    She also complains of continued constipation and bloating. She is being followed by GI for the chronic constipation, and was prescribed a new medication for constipation by Dr. Lance BoschShea. She is waiting on insurance authorization for this medication.    Medications:     Outpatient Medications Marked as Taking for the 02/07/22 encounter (Office Visit) with Iverson AlaminStade, Doyne Ellinger G, PA   Medication Sig Dispense Refill    acetaminophen (TYLENOL) 325 MG tablet Take 2 tablets (650 mg) by mouth every 6 (six) hours      celecoxib (CeleBREX) 100 MG capsule Take 1 capsule (100 mg) by mouth every 12 (twelve) hours 28 capsule 0    cyclobenzaprine (FLEXERIL) 5 MG tablet Take 1 tablet (5 mg) by mouth every 8 (eight) hours 30 tablet 0    docusate sodium (COLACE) 100 MG capsule Take 1 capsule (100 mg) by mouth daily      EPINEPHrine 0.3 MG/0.3ML Solution Auto-injector injection Inject 0.3 mLs (0.3 mg) into the muscle once as needed (anaphylaxis)      escitalopram (LEXAPRO) 20 MG tablet Take 0.5 tablets (10 mg) by mouth every morning Pt was only taking 5 mg at home as pt was titrating medication under dr orders.  She was initially given RX after husband's death      fluticasone (FLONASE) 50 MCG/ACT nasal spray 1 spray by Nasal route every morning      Galcanezumab-gnlm (Emgality) 120  MG/ML Solution Auto-injector Inject 1 mL into the skin every 30 (thirty) days      Mirabegron ER 50 MG Tablet SR 24 hr Take 1 tablet (50 mg) by mouth every morning      Omega-3 Fatty Acids (OMEGA-3 FISH OIL) 500 MG Cap Take 2 capsules (1,000 mg) by mouth every morning      plecanatide (Trulance) 3 MG tablet Take 1 tablet (3 mg) by mouth daily      senna-docusate (PERICOLACE) 8.6-50 MG per tablet Take 2 tablets by mouth nightly      Vitamin D3 (CHOLECALCIFEROL) 50 MCG (2000 UT) tablet Take 1 tablet (50 mcg) by mouth every morning         Physical Exam:   Ht 1.702 m (5\' 7" )   Wt 75.3 kg (166 lb)   BMI 26.00 kg/m   General:  well-nourished, in no apparent distress, appears comfortable, non-toxic  Pulm/Chest:  respirations unlabored  Cardiac:  regular rate and rhythm  Abdomen:  active bowel sounds, soft, non-tender, non-distended, incision is healed    Pathology/Labs-Cultures/Radiology:   No new    Assessment:     1. Visit for wound check       Postop Status:    Doing well postoperatively. Tolerating oral intake and pain  well-controlled. Stable respiratory and hemodynamic status. Discharge instructions and medications reviewed.  She is still having constipation. She is being followed by GI. I will defer to GI's management of her constipation. She does not need to return to [facility] for further follow up    Plan:   Follow-up: Return if symptoms worsen or fail to improve.     Medication Orders:    Additional Orders:  No orders of the defined types were placed in this encounter.       Instructions:  Patient Instructions   Call the GI office to follow up on the medication for constipation  No need to follow up further with our office  Wait 2 more weeks before attempting lifting greater than 15 lbs       Iverson Alamin, [place]  02/08/2022 4:38 PM    The supervising physician for this clinic visit was Dr. Reola Calkins.      I have reviewed the notes, assessments and/or procedures performed by Manuela Neptune. I concur with her/his  documentation of this patient--with these modifications made directly to the note.      Terrill Mohr MD  Trauma/Acute Care Surgery (TACS)  [facility]  Phone # [phone]  Pager # [phone]

## 2022-02-09 ENCOUNTER — Other Ambulatory Visit (INDEPENDENT_AMBULATORY_CARE_PROVIDER_SITE_OTHER): Payer: Self-pay | Admitting: INTERNAL MEDICINE

## 2022-02-09 ENCOUNTER — Encounter (INDEPENDENT_AMBULATORY_CARE_PROVIDER_SITE_OTHER): Payer: Self-pay | Admitting: Orthopaedic Surgery

## 2022-02-09 ENCOUNTER — Ambulatory Visit (INDEPENDENT_AMBULATORY_CARE_PROVIDER_SITE_OTHER): Payer: Medicare PPO | Admitting: Orthopaedic Surgery

## 2022-02-09 DIAGNOSIS — M5431 Sciatica, right side: Secondary | ICD-10-CM

## 2022-02-09 NOTE — Progress Notes (Signed)
Pt says she just had 7 inches of her colon. 30 days ago was surgery.

## 2022-02-10 ENCOUNTER — Encounter (INDEPENDENT_AMBULATORY_CARE_PROVIDER_SITE_OTHER): Payer: Self-pay | Admitting: Orthopaedic Surgery

## 2022-02-10 NOTE — Progress Notes (Signed)
Nicole Sutton                           [birth year]               [id number]     02/10/2022 following up for right leg pain since last time I saw her unfortunately she had to undergo another bowel surgery so was unable to pursue physical therapy and/or pain management referral    Physical Examination:      There were no vitals taken for this visit.     Physical exam does have pain along the lateral aspect of her leg in a dermatomal pattern no motor deficits    X-ray Findings:      Diagnosis:  1. Sciatica of right side  Ambulatory referral to Physical Therapy    Referral to Pain Clinic             Plan: Going to refer her back to physical therapy as well as pain management we will see her back in 6 to 8 weeks for recheck      Electronically signed and finalized by:    Raliegh Scarlet., DO  02/10/2022 7:56 AM     Although significant efforts were made to ensure accuracy of spelling and grammar, today's note was completed in large part by speech/voice recognition Chemical engineer) software and by keyboarding information into the electronic health care record in real time.  As such, there may be errors in grammar and spelling, insertion of incorrect words/phrases, pronoun errors, etc., that should be disregarded when reviewing this note.

## 2022-02-22 ENCOUNTER — Encounter (INDEPENDENT_AMBULATORY_CARE_PROVIDER_SITE_OTHER): Payer: Self-pay | Admitting: Internal Medicine

## 2022-02-22 ENCOUNTER — Ambulatory Visit (INDEPENDENT_AMBULATORY_CARE_PROVIDER_SITE_OTHER): Payer: Medicare Other | Admitting: Internal Medicine

## 2022-02-22 ENCOUNTER — Other Ambulatory Visit: Payer: Self-pay

## 2022-02-22 ENCOUNTER — Other Ambulatory Visit: Payer: Medicare Other | Attending: Internal Medicine

## 2022-02-22 VITALS — BP 118/66 | HR 75 | Temp 98.4°F | Ht 67.0 in | Wt 165.0 lb

## 2022-02-22 DIAGNOSIS — R5383 Other fatigue: Secondary | ICD-10-CM

## 2022-02-22 DIAGNOSIS — G8929 Other chronic pain: Secondary | ICD-10-CM

## 2022-02-22 DIAGNOSIS — Z9049 Acquired absence of other specified parts of digestive tract: Secondary | ICD-10-CM

## 2022-02-22 DIAGNOSIS — G43009 Migraine without aura, not intractable, without status migrainosus: Secondary | ICD-10-CM

## 2022-02-22 DIAGNOSIS — M25561 Pain in right knee: Secondary | ICD-10-CM

## 2022-02-22 DIAGNOSIS — D649 Anemia, unspecified: Secondary | ICD-10-CM

## 2022-02-22 DIAGNOSIS — Z23 Encounter for immunization: Secondary | ICD-10-CM

## 2022-02-22 DIAGNOSIS — Z0001 Encounter for general adult medical examination with abnormal findings: Secondary | ICD-10-CM

## 2022-02-22 DIAGNOSIS — N39498 Other specified urinary incontinence: Secondary | ICD-10-CM

## 2022-02-22 LAB — CBC W/AUTO DIFF
BASOPHIL #: <0.1 10*3/uL (ref <=0.20)
BASOPHIL %: 1 %
EOSINOPHIL #: 0.21 10*3/uL (ref <=0.50)
EOSINOPHIL %: 4 %
HCT: 33.8 % — ABNORMAL LOW (ref 34.8–46.0)
HGB: 10.6 g/dL — ABNORMAL LOW (ref 11.5–16.0)
IMMATURE GRANULOCYTE #: <0.1 10*3/uL (ref <0.10)
IMMATURE GRANULOCYTE %: 0 % (ref 0–1)
LYMPHOCYTE #: 1.56 10*3/uL (ref 1.00–4.80)
LYMPHOCYTE %: 27 %
MCH: 30.9 pg (ref 26.0–32.0)
MCHC: 31.4 g/dL (ref 31.0–35.5)
MCV: 98.5 fL (ref 78.0–100.0)
MONOCYTE #: 0.54 10*3/uL (ref 0.20–1.10)
MONOCYTE %: 9 %
MPV: 12 fL (ref 8.7–12.5)
NEUTROPHIL #: 3.51 10*3/uL (ref 1.50–7.70)
NEUTROPHIL %: 59 %
PLATELETS: 236 10*3/uL (ref 150–400)
RBC: 3.43 10*6/uL — ABNORMAL LOW (ref 3.85–5.22)
RDW-CV: 15.8 % — ABNORMAL HIGH (ref 11.5–15.5)
WBC: 5.9 10*3/uL (ref 3.7–11.0)

## 2022-02-22 MED ORDER — LIDOCAINE 5 % TOPICAL OINTMENT
TOPICAL_OINTMENT | Freq: Three times a day (TID) | CUTANEOUS | 3 refills | Status: DC | PRN
Start: 2022-02-22 — End: 2023-02-17

## 2022-02-22 MED ORDER — MYRBETRIQ 50 MG TABLET,EXTENDED RELEASE
1.0000 | ORAL_TABLET | Freq: Every day | ORAL | 3 refills | Status: DC
Start: 2022-02-22 — End: 2022-07-20

## 2022-02-22 NOTE — Patient Instructions (Addendum)
Medicare Preventive Services  Medicare coverage information Recommendation for YOU   Heart Disease and Diabetes   Lipid profile Every 5 years or more often if at risk for cardiovascular disease  ?   Diabetes Screening  yearly for those at risk for diabetes, 2 tests per year for those with prediabetes Last Glucose: 126    Diabetes Self Management Training or Medical Nutrition Therapy  For those with diabetes, up to 10 hrs initial training within a year, subsequent years up to 2 hrs of follow up training Optional for those with diabetes     Medical Nutrition Therapy Three hours of one-on-one counseling in first year, two hours in subsequent years Optional for those with diabetes, kidney disease   Intensive Behavioral Therapy for Obesity  Face-to-face counseling, first month every week, month 2-6 every other week, month 7-12 every month if continued progress is documented Optional for those with Body Mass Index 30 or higher  Your Body mass index is 25.84 kg/m.   Tobacco Cessation (Quitting) Counseling   Two attempts per year, max 4 sessions per attempt, up to 8 per year, for those with tobacco-related health condition Optional for those that use tobacco   Cancer Screening   Colorectal screening   For anyone age 60 to 26 or any age if high risk:  Screening Colonoscopy every 10 yrs if low risk,  more frequent if higher risk  OR  Cologuard Stool DNA test once every 3 years OR  Fecal Occult Blood Testing yearly OR  Flexible  Sigmoidoscopy  every 5 yr OR  CT Colonography every 5 yrs    See your schedule below   Screening Pap Test Recommended every 3 years for all women age 76 to 76, or every five years if combined with HPV test (routine screening not needed after total hysterectomy).  Medicare covers every 2 years, up to yearly if high risk.  Screening Pelvic Exam Medicare covers every 2 years, yearly if high risk or childbearing age with abnormal Pap in last 3 yrs. See your schedule below   Screening Mammogram    Recommended every 2 years for women age 70 to 28, or more frequent if you have a higher risk. Selectively recommended for women between 40-49 based on shared decisions about risk. Covered by Medicare up to every year for women age 27 or older See your schedule below      Lung Cancer Screening  Annual low dose computed tomography (LDCT scan) is recommended for those age 76-77 who smoked 20 pack-years and are current smokers or quit smoking within past 15 years (one pack-year= smoking one PPD for one year), after counseling by your doctor or nurse clinician about the possible benefits or harms. See your schedule below   Vaccinations   Pneumococcal Vaccine: Recommended routinely age 33+ with one or two separate vaccines based on your risk    Recommended before age 64 if medical conditions with increased risk  Seasonal Influenza Vaccine: Once every flu season   Hepatitis B Vaccine: 3 doses if risk (including anyone with diabetes or liver disease)  Shingles Vaccine: Two doses at age 75 or older  Diphtheria Tetanus Pertussis Vaccine: ONCE as adult, booster every 10 years     Immunization History   Administered Date(s) Administered   . Covid-19 Vaccine,Pfizer Bivalent,77mg/0.3ml,12 yrs+ 02/25/2021   . Covid-19 Vaccine,Pfizer-BioNTech,Gray Top,151yr 08/30/2020   . Covid-19 Vaccine,Pfizer-BioNTech,Purple Top,1240yr07/16/2021, 01/16/2020   . FLUZONE HD VACCINE (ADMIN) 04/05/2020   . HAEMOPHILUS B CONJUGATE VACCINE  09/15/2021   . High-Dose Influenza Vaccine, 65+ 02/15/2016   . Influenza Vaccine, 65+ 02/15/2016, 02/25/2021   . Meningococcal B Vaccine - 2 Dose (Bexsero) 09/15/2021   . Meningococcal Cyw-135 Vaccine (2 Of 2) 09/15/2021   . PREVNAR 13 07/25/2021   . Pneumovax 02/15/2016, 10/03/2021   . ZOSTAVAX (VARICELLA ZOSTER VACCINE) 06/01/2011     Shingles vaccine and Diphtheria Tetanus Pertussis vaccines are available at pharmacies or local health department without a prescription.   Other Screening   Bone Densitometry    Every 24 months for anyone at risk, including postmenopausal       Glaucoma Screening   Yearly if in high risk group such as diabetes, family history, African American age 40+ or Hispanic American age 52+   See your Eye Care Provider   Hepatitis C Screening recommended ONCE for those born between 1945-1965, or high risk for HCV infection       HIV Testing recommended routinely at least ONCE, covered every year for age 44 to 74 regardless of risk, and every year for age over 67 who ask for the test or higher risk  Yearly or up to 3 times in pregnancy         Abdominal Aortic Aneurysm Screening Ultrasound   Once between the age of 66-75 with a family history of AAA       Your Personalized Schedule for Preventive Tests     Health Maintenance: Pending and Last Completed       Date Due Completion Date    Hepatitis C screening Never done ---    Meningococcal Vaccine (1 - Risk 2-dose series) Never done ---    Adult Tdap-Td (1 - Tdap) Never done ---    Shingles Vaccine (1 of 2) 07/27/2011 06/01/2011    Meningococcal B Vaccine (2 of 4 - Increased Risk Bexsero 2-dose series) 10/13/2021 09/15/2021    Influenza Vaccine (1) 02/03/2022 02/25/2021    Depression Screening 02/23/2023 02/22/2022    Mammography 02/24/2023 02/23/2021    Colonoscopy 01/31/2026 01/31/2021    Osteoporosis screening 09/10/2031 09/09/2021                          Vaccine Information Statement    Influenza (Flu) Vaccine (Inactivated or Recombinant): What You Need to Know    Many vaccine information statements are available in Spanish and other languages. See AbsolutelyGenuine.com.br.  Hojas de informacin sobre vacunas estn disponibles en espaol y en muchos otros idiomas. Visite AbsolutelyGenuine.com.br.    1. Why get vaccinated?    Influenza vaccine can prevent influenza (flu).    Flu is a contagious disease that spreads around the [place] every year, usually between October and May. Anyone can get the flu, but it is more dangerous for some people. Infants and  young children, people 76 years and older, pregnant people, and people with certain health conditions or a weakened immune system are at greatest risk of flu complications.    Pneumonia, bronchitis, sinus infections, and ear infections are examples of flu-related complications. If you have a medical condition, such as heart disease, cancer, or diabetes, flu can make it worse.    Flu can cause fever and chills, sore throat, muscle aches, fatigue, cough, headache, and runny or stuffy nose. Some people may have vomiting and diarrhea, though this is more common in children than adults.     In an average year, thousands of people in the [place] die from flu, and  many more are hospitalized. Flu vaccine prevents millions of illnesses and flu-related visits to the doctor each year.    2. Influenza vaccines     CDC recommends everyone 6 months and older get vaccinated every flu season. Children 6 months through 46 years of age may need 2 doses during a single flu season. Everyone else needs only 1 dose each flu season.    It takes about 2 weeks for protection to develop after vaccination.    There are many flu viruses, and they are always changing. Each year a new flu vaccine is made to protect against the influenza viruses believed to be likely to cause disease in the upcoming flu season. Even when the vaccine doesn't exactly match these viruses, it may still provide some protection.     Influenza vaccine does not cause flu.    Influenza vaccine may be given at the same time as other vaccines.    3. Talk with your health care provider    Tell your vaccination provider if the person getting the vaccine:  Has had an allergic reaction after a previous dose of influenza vaccine, or has any severe, life-threatening allergies   Has ever had Guillain-Barr Syndrome (also called "GBS")    In some cases, your health care provider may decide to postpone influenza vaccination until a future visit.    Influenza vaccine can be  administered at any time during pregnancy. People who are or will be pregnant during influenza season should receive inactivated influenza vaccine.    People with minor illnesses, such as a cold, may be vaccinated. People who are moderately or severely ill should usually wait until they recover before getting influenza vaccine.    Your health care provider can give you more information.    4. Risks of a vaccine reaction    Soreness, redness, and swelling where the shot is given, fever, muscle aches, and headache can happen after influenza vaccination.  There may be a very small increased risk of Guillain-Barr Syndrome (GBS) after inactivated influenza vaccine (the flu shot).    Young children who get the flu shot along with pneumococcal vaccine (PCV13) and/or DTaP vaccine at the same time might be slightly more likely to have a seizure caused by fever. Tell your health care provider if a child who is getting flu vaccine has ever had a seizure.    People sometimes faint after medical procedures, including vaccination. Tell your provider if you feel dizzy or have vision changes or ringing in the ears.    As with any medicine, there is a very remote chance of a vaccine causing a severe allergic reaction, other serious injury, or death.    5. What if there is a serious problem?    An allergic reaction could occur after the vaccinated person leaves the clinic. If you see signs of a severe allergic reaction (hives, swelling of the face and throat, difficulty breathing, a fast heartbeat, dizziness, or weakness), call 9-1-1 and get the person to the nearest hospital.    For other signs that concern you, call your health care provider.    Adverse reactions should be reported to the Vaccine Adverse Event Reporting System (VAERS). Your health care provider will usually file this report, or you can do it yourself. Visit the VAERS website at www.vaers.SamedayNews.es or call 509-712-3258. VAERS is only for reporting reactions, and  VAERS staff members do not give medical advice.    6. The National Vaccine Injury Fiserv  The [employer] Injury Compensation Program (VICP) is a federal program that was created to compensate people who may have been injured by certain vaccines. Claims regarding alleged injury or death due to vaccination have a time limit for filing, which may be as short as two years. Visit the VICP website at GoldCloset.com.ee or call (984)769-2915 to learn about the program and about filing a claim.     7. How can I learn more?    Ask your health care provider.   Call your local or state health department.   Visit the website of the Food and Drug Administration (FDA) for vaccine package inserts and additional information at TraderRating.uy.  Contact the Centers for Disease Control and Prevention (CDC):  Call 626 075 3556 (1-800-CDC-INFO) or  Visit CDC's influenza website at https://gibson.com/.    Vaccine Information Statement   Inactivated Influenza Vaccine   01/09/2020  42 U.S.C.  [phone]   Department of Health and G[employer]er for Disease Control and Prevention    Office Use Only
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
## 2022-02-22 NOTE — Nursing Note (Signed)
02/22/22 1300   Medicare Wellness Assessment   Medicare initial or wellness physical in the last year? No   Advance Directives   Does patient have a living will or MPOA no   Has patient provided [employer] with a copy? no   Advance directive information given to the patient today? no   Activities of Daily Living   Do you need help with dressing, bathing, or walking? Yes   Do you need help with shopping, housekeeping, medications, or finances? No   Do you have rugs in hallways, broken steps, or poor lighting? No   Do you have grab bars in your bathroom, non-slip strips in your tub, and hand rails on your stairs? No   Urinary Incontinence Screen   Do you ever leak urine when you don't want to? YES   Cognitive Function Screen   What is you age? 1   What is the time to the nearest hour? 1   What is the year? 1   What is the name of this clinic? 1   Can the patient recognize two persons (the doctor, the nurse, home help, etc.)? 1   What is the date of your birth? (day and month sufficient)  1   In what year did World War II end? 1   Who is the current president of the [place]? 1   Count from 20 down to 1? 1   What address did I give you earlier? 1   Total Score 10   Interpretation of Total Score Greater than 6 Normal   Depression Screen   Little interest or pleasure in doing things. 0   Feeling down, depressed, or hopeless 0   PHQ 2 Total 0   Pain Score   Pain Score EIGHT   Substance Use Screening   In Past 12 MONTHS, how often have you used any tobacco product (for example, cigarettes, e-cigarettes, cigars, pipes, or smokeless tobacco)? Never   In the PAST 12 MONTHS, how often have you had 5 (men)/4 (women) or more drinks containing alcohol in one day? Never   In the PAST 12 months, how often have you used any prescription medications just for the feeling, more than prescribed, or that were not prescribed for you? Prescriptions may include: opioids, benzodiazepines, medications for ADHD Never   In the PAST  12 MONTHS, how often have you used any drugs, including marijuana, cocaine or crack, heroin, methamphetamine, hallucinogens, ecstasy/MDMA? Never   Hearing Screen   Have you noticed any hearing difficulties? Yes   After whispering 9-1-6 how many numbers did the patient repeat correctly? 3   Fall Risk Assessment   Do you feel unsteady when standing or walking? Yes   Do you worry about falling? Yes   Have you fallen in the past year? Yes   How many times have you fallen? Once   Were you ever injured from falling? No   Timed up and go test (in seconds)   (Patient uses cane and having pain (refused))   Vision Screen   Right Eye = 20   (rx for glasses)   Left Eye = 20   (rx for glasses)

## 2022-02-22 NOTE — Nursing Note (Signed)
BP 118/66   Pulse 75   Temp 36.9 C (98.4 F) (Thermal Scan)   Ht 1.702 m ('5\' 7"'$ )   Wt 74.8 kg (165 lb)   SpO2 98%   BMI 25.84 kg/m   Thayer Dallas, CMA

## 2022-02-22 NOTE — Nursing Note (Signed)
Patient received flu vaccine today. She tolerated injection well and did not have a reaction. She did not have any questions or concerns.        Riley Nearing, RN

## 2022-02-23 ENCOUNTER — Encounter (INDEPENDENT_AMBULATORY_CARE_PROVIDER_SITE_OTHER): Payer: Self-pay

## 2022-02-23 LAB — VITAMIN B12: VITAMIN B 12: 703 pg/mL (ref 200–900)

## 2022-02-24 ENCOUNTER — Encounter (INDEPENDENT_AMBULATORY_CARE_PROVIDER_SITE_OTHER): Payer: Self-pay | Admitting: Orthopaedic Surgery

## 2022-02-28 ENCOUNTER — Other Ambulatory Visit (INDEPENDENT_AMBULATORY_CARE_PROVIDER_SITE_OTHER): Payer: Self-pay | Admitting: Internal Medicine

## 2022-02-28 ENCOUNTER — Other Ambulatory Visit (HOSPITAL_BASED_OUTPATIENT_CLINIC_OR_DEPARTMENT_OTHER): Payer: Self-pay | Admitting: Internal Medicine

## 2022-02-28 DIAGNOSIS — M5431 Sciatica, right side: Secondary | ICD-10-CM

## 2022-02-28 NOTE — Telephone Encounter (Signed)
Menigo coccal vaccine requested

## 2022-02-28 NOTE — Telephone Encounter (Signed)
Pt was told to go to the health dept to have her meningitis vaccine.  She said they were unable to do it.  Pt said PCP told her to call if she was unable to get it there and a Rx to have it done at her pharmacy could be sent.  She is requesting a call back with response.    Thanks  Anibal Henderson

## 2022-03-01 ENCOUNTER — Encounter (INDEPENDENT_AMBULATORY_CARE_PROVIDER_SITE_OTHER): Payer: Self-pay | Admitting: Internal Medicine

## 2022-03-01 MED ORDER — BEXSERO 50 MCG-50 MCG-50 MCG-25 MCG/0.5 ML INTRAMUSCULAR SYRINGE
0.5000 mL | INJECTION | Freq: Once | INTRAMUSCULAR | 0 refills | Status: AC
Start: 2022-03-01 — End: 2022-03-01

## 2022-03-01 NOTE — Nursing Note (Addendum)
ERROR

## 2022-03-01 NOTE — Telephone Encounter (Signed)
Pt needs the exact same brand of Men B sent to the pharmacy.  It looks like she has already had both of her Meningococcal ACYW .      But she is due for her 2nd Men B.    Gregary Cromer, CMA

## 2022-03-01 NOTE — Telephone Encounter (Signed)
Script sent to [place], CMA

## 2022-03-02 NOTE — Telephone Encounter (Signed)
Called pt to advised her meningitis vaccine was called in and she said she also needs Haemophilus vaccine.    Thanks  Anibal Henderson

## 2022-03-17 NOTE — Telephone Encounter (Signed)
Pt states only 1 vaccine was called in for her.  She needs Menzeo to be called into [facility] [address] in [place].    Thanks  Anibal Henderson

## 2022-03-21 ENCOUNTER — Encounter (INDEPENDENT_AMBULATORY_CARE_PROVIDER_SITE_OTHER): Payer: Self-pay | Admitting: Orthopaedic Surgery

## 2022-04-10 ENCOUNTER — Ambulatory Visit (INDEPENDENT_AMBULATORY_CARE_PROVIDER_SITE_OTHER): Payer: Medicare PPO | Admitting: Orthopaedic Surgery

## 2022-04-11 ENCOUNTER — Other Ambulatory Visit: Payer: Self-pay

## 2022-04-11 ENCOUNTER — Ambulatory Visit
Admission: RE | Admit: 2022-04-11 | Discharge: 2022-04-11 | Disposition: A | Discharge status: Still In Hospital | Payer: Medicare Other | Source: Ambulatory Visit

## 2022-04-11 DIAGNOSIS — M5431 Sciatica, right side: Secondary | ICD-10-CM | POA: Insufficient documentation
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
## 2022-04-13 ENCOUNTER — Ambulatory Visit (HOSPITAL_BASED_OUTPATIENT_CLINIC_OR_DEPARTMENT_OTHER): Payer: Medicare Other

## 2022-04-18 ENCOUNTER — Ambulatory Visit (HOSPITAL_BASED_OUTPATIENT_CLINIC_OR_DEPARTMENT_OTHER): Payer: Medicare Other

## 2022-04-18 ENCOUNTER — Ambulatory Visit: Payer: Medicare Other | Attending: Family | Admitting: Family

## 2022-04-18 ENCOUNTER — Encounter (INDEPENDENT_AMBULATORY_CARE_PROVIDER_SITE_OTHER): Payer: Self-pay | Admitting: Family

## 2022-04-18 ENCOUNTER — Other Ambulatory Visit: Payer: Self-pay

## 2022-04-18 ENCOUNTER — Ambulatory Visit (INDEPENDENT_AMBULATORY_CARE_PROVIDER_SITE_OTHER): Payer: Medicare Other | Admitting: Family

## 2022-04-18 ENCOUNTER — Other Ambulatory Visit (HOSPITAL_COMMUNITY): Payer: Self-pay

## 2022-04-18 VITALS — BP 110/62 | HR 67 | Temp 98.3°F | Resp 18

## 2022-04-18 DIAGNOSIS — R109 Unspecified abdominal pain: Secondary | ICD-10-CM

## 2022-04-18 DIAGNOSIS — R309 Painful micturition, unspecified: Secondary | ICD-10-CM | POA: Insufficient documentation

## 2022-04-18 DIAGNOSIS — N3000 Acute cystitis without hematuria: Secondary | ICD-10-CM

## 2022-04-18 MED ORDER — SULFAMETHOXAZOLE 800 MG-TRIMETHOPRIM 160 MG TABLET
1.0000 | ORAL_TABLET | Freq: Two times a day (BID) | ORAL | 0 refills | Status: DC
Start: 2022-04-18 — End: 2022-05-19
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
## 2022-04-18 NOTE — Nursing Note (Signed)
Attempted to obtain blood drawl to pt left Ac x 1. Unsuccessful. Pressure dressing applied to site. Pt tolerated well. Charlynne Pander, LPN

## 2022-04-18 NOTE — Nursing Note (Signed)
04/18/22 1500   Urine test  (Siemens Multistix 10 SG)   Performed Status: Automated   Time collected 1604   Color (Ref Range: Yellow) Yellow   Clarity (Ref Range: Clear) Clear   Glucose (Ref Range: Negative mg/dL) Negative   Bilirubin (Ref Range: Negative mg/dL) Negative   Ketones (Ref Range: Negative mg/dL) Negative   Urine Specific Gravity (Ref Range: 1.005 - 1.030) 1.010   Blood (urine) (Ref Range: Negative mg/dL) (!) Trace Intact   pH (Ref Range: 5.0 - 8.0) 6.0   Protein (Ref Range: Negative mg/dL) Negative   Urobilinogen (Ref Range: Normal) 0.'2mg'$ /dL (Normal)   Nitrite (Ref Range: Negative) Negative   Leukocytes (Ref Range: Negative WBC's/uL) Negative   Bottle Number   (Siemens Multistix 10 SG) [id number]   Lot # [id number]   Expiration Date 03/05/23   Initials jaw

## 2022-04-18 NOTE — Nursing Note (Signed)
BP 110/62   Pulse 67   Temp 36.8 C (98.3 F) (Tympanic)   Resp 18   SpO2 97%   Charlynne Pander, LPN

## 2022-04-19 ENCOUNTER — Other Ambulatory Visit: Payer: Medicare Other | Attending: Family

## 2022-04-19 ENCOUNTER — Other Ambulatory Visit (INDEPENDENT_AMBULATORY_CARE_PROVIDER_SITE_OTHER): Payer: Self-pay | Admitting: Family

## 2022-04-19 ENCOUNTER — Telehealth (INDEPENDENT_AMBULATORY_CARE_PROVIDER_SITE_OTHER): Payer: Self-pay | Admitting: Family

## 2022-04-19 DIAGNOSIS — N3000 Acute cystitis without hematuria: Secondary | ICD-10-CM | POA: Insufficient documentation

## 2022-04-19 LAB — COMPREHENSIVE METABOLIC PANEL, NON-FASTING
ALBUMIN: 3.9 g/dL (ref 3.4–4.8)
ALKALINE PHOSPHATASE: 61 U/L (ref 55–145)
ALT (SGPT): 22 U/L (ref 8–22)
ANION GAP: 5 mmol/L (ref 4–13)
AST (SGOT): 25 U/L (ref 8–45)
BILIRUBIN TOTAL: 0.4 mg/dL (ref 0.3–1.3)
BUN/CREA RATIO: 18 (ref 6–22)
BUN: 18 mg/dL (ref 8–25)
CALCIUM: 9.6 mg/dL (ref 8.6–10.3)
CHLORIDE: 108 mmol/L (ref 96–111)
CO2 TOTAL: 26 mmol/L (ref 23–31)
CREATININE: 1.02 mg/dL (ref 0.60–1.05)
ESTIMATED GFR - FEMALE: 58 mL/min/BSA — ABNORMAL LOW (ref >=60)
GLUCOSE: 95 mg/dL (ref 65–125)
POTASSIUM: 4.7 mmol/L (ref 3.5–5.1)
PROTEIN TOTAL: 7.5 g/dL (ref 6.0–8.0)
SODIUM: 139 mmol/L (ref 136–145)

## 2022-04-19 LAB — CBC W/AUTO DIFF
BASOPHIL #: <0.1 10*3/uL (ref <=0.20)
BASOPHIL %: 1 %
EOSINOPHIL #: 0.19 10*3/uL (ref <=0.50)
EOSINOPHIL %: 4 %
HCT: 35.6 % (ref 34.8–46.0)
HGB: 11.6 g/dL (ref 11.5–16.0)
IMMATURE GRANULOCYTE #: <0.1 10*3/uL (ref <0.10)
IMMATURE GRANULOCYTE %: 0 % (ref 0–1)
LYMPHOCYTE #: 1.74 10*3/uL (ref 1.00–4.80)
LYMPHOCYTE %: 36 %
MCH: 31.7 pg (ref 26.0–32.0)
MCHC: 32.6 g/dL (ref 31.0–35.5)
MCV: 97.3 fL (ref 78.0–100.0)
MONOCYTE #: 0.54 10*3/uL (ref 0.20–1.10)
MONOCYTE %: 11 %
MPV: 11.2 fL (ref 8.7–12.5)
NEUTROPHIL #: 2.35 10*3/uL (ref 1.50–7.70)
NEUTROPHIL %: 48 %
PLATELETS: 264 10*3/uL (ref 150–400)
RBC: 3.66 10*6/uL — ABNORMAL LOW (ref 3.85–5.22)
RDW-CV: 14.1 % (ref 11.5–15.5)
WBC: 4.9 10*3/uL (ref 3.7–11.0)

## 2022-04-19 NOTE — Telephone Encounter (Signed)
Patient was seen yesterday at Urgent Care by different provider. Was diagnosed with acute cystitis and prescribed Bactrim. Was advised to go to the ER due to a complicated history including a left nephrectomy and a bowel resection in August 2023 but [name] refused. The provider attempted to draw a CBC and CMP but was unable to collect blood draws as [name] is a difficult stick. She is at the lab now for a blood draw but there are no orders placed. I advised [name] that she was advised to go to the ER during her Urgent Care visit and that is where the labs would be drawn. Continues to refuse the ER. Orders for outpatient labs placed for CBC and CMP. Reiterated that I recommended going to the ER regardless of the CBC and CMP results as suggested by the evaluating provider.    Nicole Husbands, NP

## 2022-04-19 NOTE — Result Encounter Note (Signed)
GFR mildly decreased. RBC consistent with previous blood draws. As recommended by evaluating provider, to report to ED.    Clement Husbands, NP

## 2022-04-20 ENCOUNTER — Ambulatory Visit (HOSPITAL_BASED_OUTPATIENT_CLINIC_OR_DEPARTMENT_OTHER)
Admission: RE | Admit: 2022-04-20 | Discharge: 2022-04-20 | Disposition: A | Discharge status: Home / Self Care | Payer: Medicare Other | Source: Ambulatory Visit

## 2022-04-20 LAB — URINE CULTURE: URINE CULTURE: 20000 — AB

## 2022-04-20 NOTE — Result Encounter Note (Signed)
Negative culture.  If not improving follow-up with PCP.  If worsening or developing fevers, report to ER.    Doran Clay, MD  04/20/2022, 18:42

## 2022-04-20 NOTE — PT Treatment (Signed)
No call/NS for appt today.  It was noted in chart that yesterday she was referred from UC to ED.

## 2022-04-21 NOTE — PT Treatment (Signed)
[facility]  [address]   [place],  [place]  [address]  ([phone]   [phone]    Physical Therapy Treatment Note      Patient Name:  [name]  DOB:  June 23, 1950    Visit:   1/8  Date of Surgery:     Next appointment with physician:   04/25/22--SI injection    Subjective:     "It still hurts down my leg.  My right knee is still bothering me too.  Dr Hervey Ard is going to do a SI injection on the right side tomorrow."  Objective:     Therex/HEP as follows for right sciatica:    Standing B GSC stretch on slant board  Seated B HS stretch with stool  Hooklying lumbar rotation   B hip ER stretch in hooklying   B piriformis stretch in hooklying   PPT  **Pt given written copy of ex's + pics for HEP    Manual IASTM to right gluteal region/piriformis and ITB in left sidelying x20 min:  -With right sidelying ITB stretch off edge of table as tolerated by right knee  Assessment:     She demonstrated therex appropriately with some reports of pain at right knee with stretches.  Minimal back pain, but reports of stretching due to tight musculature.  She tolerated IASTM well with stretching intermittently.  Discussed importance of compliance with HEP.  She would benefit from further PT to address ROM/flexibility and strength deficits along with pain/radicular complaints for increased ease of ADL's and overall functional mobility.   Plan:     Con't with POC 2x/week x10 visits to address above noted deficits for increased ease of ADL's and overall functional mobility.       Total Timed Treatment Minutes:    44  Total Treatment Time:    44 (2 therex, 1 manual)

## 2022-04-24 ENCOUNTER — Other Ambulatory Visit: Payer: Self-pay

## 2022-04-24 ENCOUNTER — Ambulatory Visit (HOSPITAL_BASED_OUTPATIENT_CLINIC_OR_DEPARTMENT_OTHER)
Admission: RE | Admit: 2022-04-24 | Discharge: 2022-04-24 | Disposition: A | Discharge status: Still In Hospital | Payer: Medicare Other | Source: Ambulatory Visit | Attending: Orthopaedic Surgery | Admitting: Orthopaedic Surgery

## 2022-04-25 ENCOUNTER — Ambulatory Visit (HOSPITAL_BASED_OUTPATIENT_CLINIC_OR_DEPARTMENT_OTHER): Payer: Medicare Other

## 2022-04-26 ENCOUNTER — Encounter (INDEPENDENT_AMBULATORY_CARE_PROVIDER_SITE_OTHER): Payer: Self-pay | Admitting: Primary Care

## 2022-04-26 ENCOUNTER — Other Ambulatory Visit: Payer: Self-pay

## 2022-04-26 ENCOUNTER — Other Ambulatory Visit: Payer: Medicare Other | Attending: Primary Care

## 2022-04-26 ENCOUNTER — Ambulatory Visit (INDEPENDENT_AMBULATORY_CARE_PROVIDER_SITE_OTHER): Payer: Medicare Other | Admitting: Primary Care

## 2022-04-26 ENCOUNTER — Ambulatory Visit (HOSPITAL_BASED_OUTPATIENT_CLINIC_OR_DEPARTMENT_OTHER): Payer: Medicare Other

## 2022-04-26 VITALS — BP 122/80 | HR 79 | Temp 97.8°F | Ht 67.0 in | Wt 170.0 lb

## 2022-04-26 DIAGNOSIS — Z0000 Encounter for general adult medical examination without abnormal findings: Secondary | ICD-10-CM

## 2022-04-26 DIAGNOSIS — N1831 Chronic kidney disease, stage 3a (CMS HCC): Secondary | ICD-10-CM

## 2022-04-26 DIAGNOSIS — E559 Vitamin D deficiency, unspecified: Secondary | ICD-10-CM

## 2022-04-26 DIAGNOSIS — F418 Other specified anxiety disorders: Secondary | ICD-10-CM

## 2022-04-26 LAB — COMPREHENSIVE METABOLIC PANEL, NON-FASTING
ALBUMIN: 4.2 g/dL (ref 3.4–4.8)
ALKALINE PHOSPHATASE: 59 U/L (ref 55–145)
ALT (SGPT): 22 U/L (ref 8–22)
ANION GAP: 7 mmol/L (ref 4–13)
AST (SGOT): 30 U/L (ref 8–45)
BILIRUBIN TOTAL: 0.4 mg/dL (ref 0.3–1.3)
BUN/CREA RATIO: 33 — ABNORMAL HIGH (ref 6–22)
BUN: 27 mg/dL — ABNORMAL HIGH (ref 8–25)
CALCIUM: 9.3 mg/dL (ref 8.6–10.3)
CHLORIDE: 104 mmol/L (ref 96–111)
CO2 TOTAL: 24 mmol/L (ref 23–31)
CREATININE: 0.83 mg/dL (ref 0.60–1.05)
ESTIMATED GFR - FEMALE: 75 mL/min/BSA (ref >=60)
GLUCOSE: 88 mg/dL (ref 65–125)
POTASSIUM: 4.3 mmol/L (ref 3.5–5.1)
PROTEIN TOTAL: 7.8 g/dL (ref 6.0–8.0)
SODIUM: 135 mmol/L — ABNORMAL LOW (ref 136–145)

## 2022-04-26 MED ORDER — CHOLECALCIFEROL (VITAMIN D3) 10 MCG (400 UNIT) CAPSULE
400.0000 [IU] | ORAL_CAPSULE | Freq: Every day | ORAL | 3 refills | Status: DC
Start: 2022-04-26 — End: 2023-10-05

## 2022-04-26 MED ORDER — ESCITALOPRAM 5 MG TABLET
5.0000 mg | ORAL_TABLET | Freq: Every day | ORAL | 3 refills | Status: DC
Start: 2022-04-26 — End: 2022-05-19

## 2022-04-26 NOTE — Nursing Note (Signed)
BP 122/80   Pulse 79   Temp 36.6 C (97.8 F) (Thermal Scan)   Ht 1.702 m ('5\' 7"'$ )   Wt 77.1 kg (170 lb)   SpO2 98%   BMI 26.63 kg/m   Riki Sheer, LPN
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
## 2022-04-28 NOTE — Result Encounter Note (Signed)
Please inform patient that CMP does indicate significant improvement in her GFR indicating normalization of kidney function.

## 2022-05-02 ENCOUNTER — Ambulatory Visit (HOSPITAL_BASED_OUTPATIENT_CLINIC_OR_DEPARTMENT_OTHER): Payer: Medicare Other

## 2022-05-04 ENCOUNTER — Ambulatory Visit
Admission: RE | Admit: 2022-05-04 | Discharge: 2022-05-04 | Disposition: A | Discharge status: Still In Hospital | Payer: Medicare Other | Source: Ambulatory Visit

## 2022-05-04 ENCOUNTER — Other Ambulatory Visit: Payer: Self-pay

## 2022-05-04 NOTE — PT Treatment (Signed)
[facility] Physical Therapy  [address],   [place],  [place]  [address]  ([phone]   [phone]    Physical Therapy Treatment Note      Patient Name:  [name]  DOB:  1951-05-29    Visit:   2/8  Date of Surgery:     Next appointment with physician:   04/25/22--SI injection    Subjective:     "I had the SI injection on Tuesday.  I haven't noticed a difference and I still feel the pain down the right side of my leg."   She reports that she performed ex's this morning without issue.   Objective:     Therex/HEP as follows for right sciatica:    Standing B GSC stretch on slant board--HOLD  Seated B HS stretch with stool  Hooklying lumbar rotation --HOLD  B hip ER stretch in hooklying --HOLD  B piriformis stretch in hooklying --HOLD  PPT--HOLD  Sidelying for right ITB stretch off side of table in front of her  **Pt given written copy of ex's + pics for HEP    Manual IASTM to right gluteal region/piriformis and ITB in left sidelying x25 min:  -With right sidelying ITB stretch off edge of table as tolerated by right knee  Assessment:     Pt arrived with con't reports of pain at SI region and down RLE.  Supine ex's held as she was concerned about lying supine on hard mat table and had already performed ex's at home and will perform again this evening on her bed which is softer.  She demonstrated therex appropriately with some reports of pain at right knee with sidelying stretches once again.  Minimal back pain, but reports of stretching due to tight musculature with HS.  She tolerated IASTM well with stretching intermittently other than occasional right knee pain.  Discussed importance of compliance with HEP.  She would benefit from further PT to address ROM/flexibility and strength deficits along with pain/radicular complaints for increased ease of ADL's and overall functional mobility.   Plan:     Con't with POC 2x/week x10 visits to address  above noted deficits for increased ease of ADL's and overall functional mobility.       Total Timed Treatment Minutes:  35  Total Treatment Time:   35 (1 therex, 1 manual)

## 2022-05-09 ENCOUNTER — Ambulatory Visit
Admission: RE | Admit: 2022-05-09 | Discharge: 2022-05-09 | Disposition: A | Discharge status: Still In Hospital | Payer: Medicare Other | Source: Ambulatory Visit | Attending: Orthopaedic Surgery | Admitting: Orthopaedic Surgery

## 2022-05-09 ENCOUNTER — Other Ambulatory Visit: Payer: Self-pay

## 2022-05-09 NOTE — PT Treatment (Signed)
[facility] Physical Therapy  [address]   [place],  [place]  [address]  ([phone]   [phone]    Physical Therapy Treatment Note      Patient Name:  [name]  DOB:  [birth year]    Visit:   3/8  Date of Surgery:     Next appointment with physician:   04/25/22--SI injection    Subjective:     "I'm not having as much pain since having the injection but I always have pain going down my right leg.  I felt ok after my last session.  I'm doing the ex's at home twice a day."    Objective:     Therex/HEP as follows for right sciatica:    Standing B GSC stretch on slant board--HOLD  Seated B HS stretch with stool  Hooklying lumbar rotation   B hip ER stretch in hooklying  B piriformis stretch in hooklying   PPT  Sidelying for right ITB stretch off side of table in front of her  **Pt has written copy of ex's + pics for HEP    Manual IASTM to right gluteal region/piriformis and ITB in left sidelying x26 min:  -With right sidelying ITB stretch off edge of table as tolerated by right knee  Assessment:     Pt arrived with con't reports of pain at SI region and down RLE.  Pt wanting to perform ex's in clinic today.   She demonstrated therex appropriately with some reports of pain at right knee with sidelying stretches once again.  Minimal back pain, but reports of stretching due to tight musculature with HS.  She also c/o pain and stretching in low back when performing hip IR/ER stretching.  She tolerated IASTM well with stretching intermittently other than occasional right knee pain.  Discussed importance of compliance with HEP.  She would benefit from further PT to address ROM/flexibility and strength deficits along with pain/radicular complaints for increased ease of ADL's and overall functional mobility.   Plan:     Con't with POC 2x/week x10 visits to address above noted deficits for increased ease of ADL's and overall functional mobility.        Total Timed Treatment Minutes:  40  Total Treatment Time:   40 (1 therex, 2 manual)

## 2022-05-11 ENCOUNTER — Ambulatory Visit (HOSPITAL_BASED_OUTPATIENT_CLINIC_OR_DEPARTMENT_OTHER)
Admission: RE | Admit: 2022-05-11 | Discharge: 2022-05-11 | Disposition: A | Discharge status: Still In Hospital | Payer: Medicare Other | Source: Ambulatory Visit

## 2022-05-11 ENCOUNTER — Other Ambulatory Visit: Payer: Self-pay

## 2022-05-11 DIAGNOSIS — M5431 Sciatica, right side: Secondary | ICD-10-CM | POA: Insufficient documentation

## 2022-05-11 NOTE — PT Treatment (Signed)
[facility] Physical Therapy  [address],   [place] and [place],  [place]  [address]  ([phone]   [phone]    Physical Therapy Treatment Note      Patient Name:  [name]  DOB:  Aug 17, 1950    Visit:   4/8  Date of Surgery:     Next appointment with physician:   04/25/22--SI injection    Subjective:     "Since the injection there are times when I have 2 hours with no pain then it comes back."   Objective:     Therex/HEP as follows for right sciatica:    Standing B GSC stretch on slant board--HOLD  Seated B HS stretch with stool  Hooklying lumbar rotation   B hip ER stretch in hooklying  B piriformis stretch in hooklying   PPT  Sidelying for right ITB stretch off side of table in front of her  **Pt has written copy of ex's + pics for HEP    Manual IASTM to right gluteal region/piriformis and ITB in left sidelying x15 min:  -With right sidelying ITB stretch off edge of table as tolerated by right knee  Assessment:     Pt arrived with con't reports of pain at SI region and down RLE.  Pt wanting to have treatment session in private room once again as she felt that she couldn't do anything in the gym.  She demonstrated therex appropriately with some reports of right SI discomfort.  Minimal back pain, but reports of stretching due to tight musculature with HS and ITB.  She also c/o pain and stretching in low back when performing hip IR/ER stretching.  She tolerated IASTM well with stretching intermittently, but no pain.  Discussed importance of compliance with HEP.  She would benefit from further PT to address ROM/flexibility and strength deficits along with pain/radicular complaints for increased ease of ADL's and overall functional mobility.   Plan:     Con't with POC 2x/week x10 visits to address above noted deficits for increased ease of ADL's and overall functional mobility.       Total Timed Treatment Minutes:  35  Total Treatment Time:   35 (1  therex, 1 manual)

## 2022-05-16 ENCOUNTER — Other Ambulatory Visit: Payer: Self-pay

## 2022-05-16 ENCOUNTER — Ambulatory Visit
Admission: RE | Admit: 2022-05-16 | Discharge: 2022-05-16 | Disposition: A | Discharge status: Still In Hospital | Payer: Medicare Other | Source: Ambulatory Visit

## 2022-05-16 NOTE — PT Treatment (Signed)
[facility] Physical Therapy  [address]   [place],  [place]  [address]  ([phone]   [phone]    Physical Therapy Treatment Note      Patient Name:  [name]  DOB:  02-Nov-1950    Visit:   4/8  Date of Surgery:     Next appointment with physician:   04/25/22--SI injection    Subjective:     "I feel like I've improved with my mobility, but my pain is still there.  Sometimes it goes all the way down my leg into my foot."  "The other day I did way too much at home and it killed me the following day."   Objective:     Therex/HEP as follows for right sciatica:    Standing B GSC stretch on slant board  Seated B HS stretch with stool  Hooklying lumbar rotation   B hip ER stretch in hooklying  B piriformis stretch in hooklying   PPT  Sidelying for right ITB stretch off side of table in front of her  **Pt has written copy of ex's + pics for HEP    Manual IASTM to right gluteal region/piriformis and ITB in left sidelying x15 min:  -With right sidelying ITB stretch off edge of table as tolerated by right knee  Assessment:     Pt arrived with con't reports of pain at SI region and down RLE at times, but nothing at this exact point in time.   She demonstrated therex appropriately with no reports of right SI discomfort during performance.  Minimal back pain, but reports of stretching due to tight musculature with HS and ITB.  Improved mobility noted at B hips with stretching program today.  She tolerated IASTM well with stretching intermittently, but no pain.  Discussed importance of compliance with HEP.  She would benefit from further PT to address ROM/flexibility and strength deficits along with pain/radicular complaints for increased ease of ADL's and overall functional mobility.   Plan:     Con't with POC 2x/week x10 visits to address above noted deficits for increased ease of ADL's and overall functional mobility.       Total Timed Treatment  Minutes:  40  Total Treatment Time:   40 (2 therex, 1 manual)

## 2022-05-18 ENCOUNTER — Other Ambulatory Visit: Payer: Self-pay

## 2022-05-18 ENCOUNTER — Ambulatory Visit (HOSPITAL_BASED_OUTPATIENT_CLINIC_OR_DEPARTMENT_OTHER)
Admission: RE | Admit: 2022-05-18 | Discharge: 2022-05-18 | Disposition: A | Discharge status: Still In Hospital | Payer: Medicare Other | Source: Ambulatory Visit

## 2022-05-18 NOTE — PT Treatment (Signed)
[facility] Physical Therapy  [address]   [place] and [place],  [place]  [address]  ([phone]   [phone]    Physical Therapy Treatment Note      Patient Name:  [name]  DOB:  1950/10/17    Visit:   5/8  Date of Surgery:     Next appointment with physician:   04/25/22--SI injection    Subjective:     "I feel like it's helping overall.  I feel like I can do more and go longer until my symptoms start.   Right now it's tingling down to my foot.  I've been busy decorating though.  I was doing 2500 steps/day, but today I'm already over 6,000."    Objective:     Therex/HEP as follows for right sciatica:    Standing B GSC stretch on slant board  Seated B HS stretch with stool  Hooklying lumbar rotation   B hip ER stretch in hooklying  B piriformis stretch in hooklying   PPT  Sidelying for right ITB stretch off side of table in front of her  **Pt has written copy of ex's + pics for HEP    Manual IASTM to right gluteal region/piriformis and ITB in left sidelying x15 min:  -With right sidelying ITB stretch off edge of table as tolerated by right knee  Assessment:     Pt arrived with con't reports of pain at SI region and down RLE at times.  She feels her symptoms are better overall, but is currently having some tingling to her foot as she has been busy decorating.    She demonstrated therex appropriately with no reports of right SI discomfort during performance.  Minimal back pain, but reports of stretching due to tight musculature with HS and ITB.  Improved mobility noted at B hips with stretching program today.  She tolerated IASTM well with stretching intermittently with tenderness over SI joint when tool was moved over this region.   Discussed importance of compliance with HEP.  She would benefit from further PT to address ROM/flexibility and strength deficits along with pain/radicular complaints for increased ease of ADL's and overall functional  mobility.   Pt also requested several ROM for left shoulder pain.  These were given to her and she was instructed in appropriate performance, but not to perform if they were painful.  She verbalized understanding.   Plan:     Con't with POC 2x/week x10 visits to address above noted deficits for increased ease of ADL's and overall functional mobility.       Total Timed Treatment Minutes:  41  Total Treatment Time:   41 (2 therex, 1 manual)

## 2022-05-19 ENCOUNTER — Other Ambulatory Visit: Payer: Medicare Other | Attending: Internal Medicine

## 2022-05-19 ENCOUNTER — Encounter (INDEPENDENT_AMBULATORY_CARE_PROVIDER_SITE_OTHER): Payer: Self-pay | Admitting: Internal Medicine

## 2022-05-19 ENCOUNTER — Ambulatory Visit (INDEPENDENT_AMBULATORY_CARE_PROVIDER_SITE_OTHER): Payer: Medicare Other | Admitting: Internal Medicine

## 2022-05-19 VITALS — BP 120/80 | HR 95 | Temp 97.5°F | Ht 67.0 in | Wt 156.8 lb

## 2022-05-19 DIAGNOSIS — F321 Major depressive disorder, single episode, moderate: Secondary | ICD-10-CM | POA: Insufficient documentation

## 2022-05-19 DIAGNOSIS — Z1231 Encounter for screening mammogram for malignant neoplasm of breast: Secondary | ICD-10-CM

## 2022-05-19 DIAGNOSIS — R5383 Other fatigue: Secondary | ICD-10-CM

## 2022-05-19 DIAGNOSIS — R911 Solitary pulmonary nodule: Secondary | ICD-10-CM

## 2022-05-19 DIAGNOSIS — F418 Other specified anxiety disorders: Secondary | ICD-10-CM

## 2022-05-19 DIAGNOSIS — R634 Abnormal weight loss: Secondary | ICD-10-CM

## 2022-05-19 LAB — THYROID STIMULATING HORMONE WITH FREE T4 REFLEX: TSH: 0.694 u[IU]/mL (ref 0.350–4.940)

## 2022-05-19 MED ORDER — ESCITALOPRAM 10 MG TABLET
10.0000 mg | ORAL_TABLET | Freq: Every day | ORAL | 3 refills | Status: DC
Start: 2022-05-19 — End: 2022-07-20

## 2022-05-19 NOTE — Nursing Note (Signed)
BP 120/80   Pulse 95   Temp 36.4 C (97.5 F) (Thermal Scan)   Ht 1.702 m ('5\' 7"'$ )   Wt 71.1 kg (156 lb 12.8 oz)   SpO2 97%   BMI 24.56 kg/m       Vernon Prey, CMA
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
## 2022-05-22 ENCOUNTER — Encounter (HOSPITAL_COMMUNITY): Payer: Self-pay

## 2022-05-23 ENCOUNTER — Other Ambulatory Visit: Payer: Self-pay

## 2022-05-23 ENCOUNTER — Ambulatory Visit
Admission: RE | Admit: 2022-05-23 | Discharge: 2022-05-23 | Disposition: A | Discharge status: Still In Hospital | Payer: Medicare Other | Source: Ambulatory Visit

## 2022-05-23 NOTE — PT Treatment (Signed)
[facility]  [address],   [place] [address],  [place]  [address]  ([phone]   [phone]    Physical Therapy Treatment Note      Patient Name:  [name]  DOB:  [birth year]    Visit:   6/8  Date of Surgery:     Next appointment with physician:   04/25/22--SI injection    Subjective:     "I've been very busy with getting ready for the holidays so my leg is really hurting today and I'm just worn out."   Objective:     Therex/HEP as follows for right sciatica:    Standing B GSC stretch on slant board  Seated B HS stretch with stool  Hooklying lumbar rotation   B hip ER stretch in hooklying  B piriformis stretch in hooklying   PPT  PPT + march   Sidelying for right ITB stretch off side of table in front of her  **Pt has written copy of ex's + pics for HEP    Manual IASTM to right gluteal region/piriformis and ITB in left sidelying x12 min:  -With right sidelying ITB stretch off edge of table as tolerated by right knee  Assessment:     Pt arrived with con't reports of pain at SI region and down RLE at times as she has been up on her feet at good bit.  Overall, she feels about the same.  She was instructed to contact MD regarding plateau of status.  She demonstrated therex appropriately with some reports of right SI discomfort during performance.  Slight tightness noted with B hip ER stretch today.   She tolerated IASTM well with stretching intermittently with tenderness over SI joint when tool was moved over this region.  Minimal progress noted overall with pain complaints remaining about the same.   Discussed importance of compliance with HEP.  She may benefit from further PT to address ROM/flexibility and strength deficits along with pain/radicular complaints for increased ease of ADL's and overall functional mobility.   Pt also requested several ROM for left shoulder pain.  These were given to her and she was instructed in appropriate  performance, but not to perform if they were painful.  She verbalized understanding.   Plan:     Con't with POC 2x/week x10 visits to address above noted deficits for increased ease of ADL's and overall functional mobility.       Total Timed Treatment Minutes:  30  Total Treatment Time:   30 (1 therex, 1 manual)

## 2022-05-25 ENCOUNTER — Other Ambulatory Visit: Payer: Self-pay

## 2022-05-25 ENCOUNTER — Ambulatory Visit
Admission: RE | Admit: 2022-05-25 | Discharge: 2022-05-25 | Disposition: A | Discharge status: Still In Hospital | Payer: Medicare Other | Source: Ambulatory Visit | Attending: Orthopaedic Surgery | Admitting: Orthopaedic Surgery

## 2022-05-25 NOTE — PT Treatment (Cosign Needed Addendum)
[facility]  [address]   [place],    [address]  ([phone]   [phone]    Physical Therapy Treatment Note      Patient Name:  [name]  DOB:  1950/07/01    Visit:   8/8  Date of Surgery:     Next appointment with physician:   04/25/22--SI injection    Subjective:     "I'm just tired and it's aching down my leg."  "I've been so busy getting ready."   Objective:     Current measurements as follows:  Independent HEP   ROM:     Trunk/L-spine:   Flexion=minimally limited,  Extension=moderately limited with no pain,  B rot=moderately  limited with no pain currently  BLE's grossly WFL's.   Strength:     Right LE: Hip flexion=4/5,  Knee extension and flexion=4+/5.   Left LE: Grossly 4+/5 except hip flexion=4 to 4+/5  Pain:  5/10(best),  9/10(worst)  FOTO:  58    Therex/HEP as follows for right sciatica:    Standing B GSC stretch on slant board  Seated B HS stretch with stool  Hooklying lumbar rotation   B hip ER stretch in hooklying  B piriformis stretch in hooklying   PPT  PPT + march   Sidelying for right ITB stretch off side of table in front of her  **Pt given written copy of all ex's + pics for HEP    Manual IASTM to right gluteal region/piriformis and ITB in left sidelying x10 min:  -With right sidelying ITB stretch off edge of table as tolerated by right knee  Assessment:     Pt arrived with con't reports of pain at Right SI region and down RLE at times as she has been up on her feet at good bit.  Overall, she feels about the same with pain complaints remaining the same.  She was instructed to contact MD regarding plateau of status and she may need further diagnostics or referral to neurosurgeon. Reviewed all ex's for HEP and she is independent.  She demonstrated therex appropriately with some reports of right SI discomfort during performance.  Slight tightness noted with B hip ER stretch today.   She tolerated  IASTM well with stretching intermittently with tenderness over SI joint when tool was moved over this region.   She did have some tingling down RLE during IASTM today and before/after session.   Minimal progress noted overall with pain complaints remaining about the same.   Discussed importance of con't compliance with HEP.  D/C to HEP at this time due to lack of progress with pain complaints.  STG's:  (2 weeks):     1.  Patient will be independent with HEP for ROM/flexibility and strengthening for increased ease of ADL's. --MET     LTG's:  (10 visits):  Pt will:  1.  Increase trunk/L-spine ROM to minimal to moderate limitations in all planes for increased ease of ADL's and overall functional mobility.--MET  2.  Decrease RLE radicular complaints 50% for increased ease of ADL's.  3.  Decrease muscle tightness/tenderness as noted above with palpation 50% for increased ease of ADL's and overall functional mobility.   4.  Increase BLE strength to 4 to 4+/5 in all planes for increased ease of ambulation/functional mobility. --MET  5.  Improve B piriformis, HS and GSC flexibility to WNL's for increased ease of ADL's. --  MET  Plan:     D/C to HEP with no change in pain complaints.    [name], MPT    Total Timed Treatment Minutes: 45  Total Treatment Time:   45 (2 therex, 1 manual)

## 2022-05-30 ENCOUNTER — Other Ambulatory Visit: Payer: Self-pay

## 2022-05-30 ENCOUNTER — Inpatient Hospital Stay
Admission: RE | Admit: 2022-05-30 | Discharge: 2022-05-30 | Disposition: A | Discharge status: Home / Self Care | Payer: Medicare Other | Source: Ambulatory Visit | Attending: Internal Medicine | Admitting: Internal Medicine

## 2022-05-30 ENCOUNTER — Encounter (HOSPITAL_BASED_OUTPATIENT_CLINIC_OR_DEPARTMENT_OTHER): Payer: Self-pay

## 2022-05-30 DIAGNOSIS — Z1231 Encounter for screening mammogram for malignant neoplasm of breast: Secondary | ICD-10-CM | POA: Insufficient documentation

## 2022-06-01 ENCOUNTER — Ambulatory Visit: Payer: Medicare Other

## 2022-06-06 ENCOUNTER — Inpatient Hospital Stay
Admission: RE | Admit: 2022-06-06 | Discharge: 2022-06-06 | Disposition: A | Discharge status: Home / Self Care | Payer: Medicare Other | Source: Ambulatory Visit | Attending: Internal Medicine | Admitting: Internal Medicine

## 2022-06-06 ENCOUNTER — Other Ambulatory Visit: Payer: Self-pay

## 2022-06-06 DIAGNOSIS — R911 Solitary pulmonary nodule: Secondary | ICD-10-CM

## 2022-07-17 ENCOUNTER — Other Ambulatory Visit: Payer: Self-pay

## 2022-07-17 ENCOUNTER — Other Ambulatory Visit: Payer: Medicare Other | Attending: Internal Medicine

## 2022-07-17 DIAGNOSIS — R634 Abnormal weight loss: Secondary | ICD-10-CM | POA: Insufficient documentation

## 2022-07-17 LAB — BASIC METABOLIC PANEL
ANION GAP: 7 mmol/L (ref 4–13)
BUN/CREA RATIO: 31 — ABNORMAL HIGH (ref 6–22)
BUN: 26 mg/dL — ABNORMAL HIGH (ref 8–25)
CALCIUM: 9.2 mg/dL (ref 8.6–10.3)
CHLORIDE: 105 mmol/L (ref 96–111)
CO2 TOTAL: 25 mmol/L (ref 23–31)
CREATININE: 0.85 mg/dL (ref 0.60–1.05)
ESTIMATED GFR - FEMALE: 72 mL/min/BSA (ref >=60)
GLUCOSE: 96 mg/dL (ref 65–125)
POTASSIUM: 4.5 mmol/L (ref 3.5–5.1)
SODIUM: 137 mmol/L (ref 136–145)

## 2022-07-17 LAB — H & H
HCT: 36.1 % (ref 34.8–46.0)
HGB: 11.2 g/dL — ABNORMAL LOW (ref 11.5–16.0)

## 2022-07-19 ENCOUNTER — Other Ambulatory Visit (INDEPENDENT_AMBULATORY_CARE_PROVIDER_SITE_OTHER): Payer: Self-pay

## 2022-07-20 ENCOUNTER — Other Ambulatory Visit: Payer: Self-pay

## 2022-07-20 ENCOUNTER — Encounter (INDEPENDENT_AMBULATORY_CARE_PROVIDER_SITE_OTHER): Payer: Self-pay | Admitting: Internal Medicine

## 2022-07-20 ENCOUNTER — Ambulatory Visit (INDEPENDENT_AMBULATORY_CARE_PROVIDER_SITE_OTHER): Payer: Medicare Other | Admitting: Internal Medicine

## 2022-07-20 VITALS — BP 120/78 | HR 69 | Temp 97.3°F | Ht 67.0 in | Wt 161.4 lb

## 2022-07-20 DIAGNOSIS — Z9109 Other allergy status, other than to drugs and biological substances: Secondary | ICD-10-CM

## 2022-07-20 DIAGNOSIS — C785 Secondary malignant neoplasm of large intestine and rectum: Secondary | ICD-10-CM

## 2022-07-20 DIAGNOSIS — M8588 Other specified disorders of bone density and structure, other site: Secondary | ICD-10-CM

## 2022-07-20 DIAGNOSIS — G43009 Migraine without aura, not intractable, without status migrainosus: Secondary | ICD-10-CM

## 2022-07-20 DIAGNOSIS — Z0000 Encounter for general adult medical examination without abnormal findings: Secondary | ICD-10-CM

## 2022-07-20 DIAGNOSIS — E7212 Methylenetetrahydrofolate reductase deficiency: Secondary | ICD-10-CM

## 2022-07-20 DIAGNOSIS — M461 Sacroiliitis, not elsewhere classified: Secondary | ICD-10-CM

## 2022-07-20 MED ORDER — MYRBETRIQ 50 MG TABLET,EXTENDED RELEASE
1.0000 | ORAL_TABLET | Freq: Every day | ORAL | 3 refills | Status: DC
Start: 2022-07-20 — End: 2023-01-24

## 2022-07-20 MED ORDER — ALENDRONATE 70 MG TABLET
70.0000 mg | ORAL_TABLET | ORAL | 3 refills | Status: DC
Start: 2022-07-20 — End: 2023-06-15

## 2022-07-20 MED ORDER — ESCITALOPRAM 10 MG TABLET
10.0000 mg | ORAL_TABLET | Freq: Every day | ORAL | 3 refills | Status: DC
Start: 2022-07-20 — End: 2023-01-24

## 2022-07-20 MED ORDER — FLUTICASONE PROPIONATE 50 MCG/ACTUATION NASAL SPRAY,SUSPENSION
1.0000 | Freq: Every day | NASAL | 3 refills | Status: DC
Start: 2022-07-20 — End: 2023-05-07

## 2022-07-20 NOTE — Patient Instructions (Signed)
Medicare Preventive Services  Medicare coverage information Recommendation for YOU   Heart Disease and Diabetes   Lipid profile Every 5 years or more often if at risk for cardiovascular disease     Lab Results   Component Value Date    CHOLESTEROL 220 (H) 06/11/2013    HDLCHOL 99 (H) 06/11/2013    LDLCHOL 114 06/11/2013    TRIG 34 (D) 06/11/2013         Diabetes Screening    Yearly for those at risk for diabetes, 2 tests per year for those with prediabetes Last Glucose:      Diabetes Self Management Training or Medical Nutrition Therapy  For those with diabetes, up to 10 hrs initial training within a year, subsequent years up to 2 hrs of follow up training Optional for those with diabetes     Medical Nutrition Therapy  Three hours of one-on-one counseling in first year, two hours in subsequent years Optional for those with diabetes, kidney disease   Intensive Behavioral Therapy for Obesity  Face-to-face counseling, first month every week, month 2-6 every other week, month 7-12 every month if continued progress is documented Optional for those with Body Mass Index 30 or higher  Your Body mass index is 25.28 kg/m.   Tobacco Cessation (Quitting) Counseling   Covers up to 8 smoking and tobacco-use cessation counseling sessions in a 69-monthperiod.    Optional for those that use tobacco   Cancer Screening Last Completion Date   Colorectal screening   For anyone age 21974to 788or any age if high risk:  Screening Colonoscopy every 10 yrs if low risk,  more frequent if higher risk  OR  Cologuard Stool DNA test once every 3 years OR  Fecal Occult Blood Testing yearly OR  Flexible  Sigmoidoscopy  every 5 yr OR  CT Colonography every 5 yrs    --01/31/2021  See below for due date if applicable.   Screening Pap Test   Recommended every 3 years for all women age 218to 67 or every five years if combined with HPV test (routine screening not needed after total hysterectomy).  Medicare covers every 2 years or yearly if high  risk.  Screening Pelvic Exam   Medicare covers every 2 years, yearly if high risk or childbearing age with abnormal Pap in last 3 yrs.     See below for due date if applicable.   Screening Mammogram   Recommended every 2 years for women age 8113to 716 or more frequent if you have a higher risk. Selectively recommended for women between 40-49 based on shared decisions about risk. Covered by Medicare up to every year for women age 21931or older --05/30/2022  See below for due date if applicable.         Lung Cancer Screening  Annual low dose computed tomography (LDCT scan) is recommended for those age 73-80who smoked 20 pack-years and are current smokers or quit smoking within past 15 years, after counseling by your doctor or nurse clinician about the possible benefits or harms.     See below for due date if applicable.   Vaccinations   Respiratory syncytial virus (RSV)  Age 6480years or older: Based on shared clinical decision-making with your provider.  Pneumococcal Vaccine  Recommended routinely age 978+with one or two separate vaccines based on your risk. Recommended before age 995if medical conditions with increased risk  Seasonal Influenza Vaccine  Once every flu season  Hepatitis B Vaccine  3 doses if risk (including anyone with diabetes or liver disease)  Shingles Vaccine  Two doses at age 27 or older  Diphtheria Tetanus Pertussis Vaccine  ONCE as adult, booster every 10 years     Immunization History   Administered Date(s) Administered    Covid-19 Vaccine,Pfizer Bivalent,64mg/0.3ml,12 yrs+ 02/25/2021    Covid-19 Vaccine,Pfizer-BioNTech,Gray Top,170yr 08/30/2020    Covid-19 Vaccine,Pfizer-BioNTech,Purple Top,1231yr07/16/2021, 01/16/2020    FLUZONE HD VACCINE (ADMIN) 04/05/2020    HAEMOPHILUS B CONJUGATE VACCINE 09/15/2021    Haemophilis B Conjugate Vaccine 09/15/2021    High-Dose Influenza Vaccine, 65+ 02/15/2016, 04/05/2020    Influenza Vaccine, 65+ 02/15/2016, 02/25/2021, 02/22/2022    Meningococcal B  Vaccine - 2 Dose 09/15/2021    Meningococcal Cyw-135 Vaccine (2 Of 2) 09/15/2021    PREVNAR 13 07/25/2021    Pneumovax 02/15/2016, 10/03/2021    ZOSTAVAX (VARICELLA ZOSTER VACCINE) 06/01/2011     Shingles vaccine and Diphtheria Tetanus Pertussis vaccines are available at pharmacies or local health department without a prescription.   Other Preventative Screening  Last Completion Date   Bone Densitometry   Screening: All females ages 65 5d older every 10 years if initial screening normal. Postmenopausal women ages 50-63-64ed screening with one or more risk factor: previous fracture, parental hip fracture, current smoker, low body weight, excessive alcohol use, Rheumatoid Arthritis   For women with diagnosed Osteoporosis, follow up is recommended every 2 years or a frequency recommended by your provider.     --09/09/2021  See below for due date if applicable.     Glaucoma Screening   Yearly if in high risk group such as diabetes, family history, African American age 68+6+ Hispanic American age 81+22+See your eye care provider for screening.   Hepatitis C Screening   Recommended  for those born between ages 18-79 years.     See below for due date if applicable.     HIV Testing  Recommended routinely at least ONCE, covered every year for age 54 30 65 22gardless of risk, and every year for age over 65 53o ask for the test or higher risk. Yearly or up to 3 times in pregnancy         See below for due date if applicable.   Abdominal Aortic Aneurysm Screening Ultrasound   Once with a family history of abdominal aortic aneurysms OR a female between 65-44-75d have smoked at least 100 cigarettes in your lifetime.         See below for due date if applicable.       Your Personalized Schedule for Preventive Tests     Health Maintenance: Pending and Last Completed         Date Due Completion Date    Hepatitis C screening Never done ---    Meningococcal Vaccine (1 - Risk 2-dose series) Never done ---    Adult Tdap-Td (1 - Tdap)  Never done ---    Shingles Vaccine (1 of 2) 07/27/2011 06/01/2011    Meningococcal B Vaccine (2 of 4 - Increased Risk Bexsero 2-dose series) 10/13/2021 09/15/2021    Covid-19 Vaccine (5 - 2023-24 season) 02/03/2022 02/25/2021    Medicare Annual Wellness Visit - Calendar Year Insurers 06/05/2022 02/22/2022    Osteoporosis screening 09/10/2023 09/09/2021    Mammography 05/30/2024 05/30/2022    Colonoscopy 01/31/2026 01/31/2021                  For Information on Advanced  Directives for Health Care:  [place]:  [email or web address]  [place], [place], [place], [place] [facility] Information: [email or web address]
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
## 2022-07-20 NOTE — Addendum Note (Signed)
Addended byAaron Edelman on: 07/20/2022 04:00 PM     Modules accepted: Orders

## 2022-07-20 NOTE — Nursing Note (Signed)
BP 120/78   Pulse 69   Temp 36.3 C (97.3 F) (Thermal Scan)   Ht 1.702 m (5' 7"$ )   Wt 73.2 kg (161 lb 6.4 oz)   SpO2 97%   BMI 25.28 kg/m     Vernon Prey, CMA

## 2022-07-27 ENCOUNTER — Other Ambulatory Visit (INDEPENDENT_AMBULATORY_CARE_PROVIDER_SITE_OTHER): Payer: Self-pay

## 2022-07-28 ENCOUNTER — Ambulatory Visit: Payer: Medicare Other | Attending: Hematology & Oncology | Admitting: Hematology & Oncology

## 2022-07-28 ENCOUNTER — Other Ambulatory Visit: Payer: Self-pay

## 2022-07-28 ENCOUNTER — Other Ambulatory Visit (HOSPITAL_COMMUNITY): Payer: Medicare Other

## 2022-07-28 ENCOUNTER — Encounter (HOSPITAL_COMMUNITY): Payer: Self-pay | Admitting: Hematology & Oncology

## 2022-07-28 VITALS — BP 127/58 | HR 81 | Temp 96.9°F | Resp 30 | Ht 66.54 in | Wt 173.8 lb

## 2022-07-28 DIAGNOSIS — R5383 Other fatigue: Secondary | ICD-10-CM

## 2022-07-28 DIAGNOSIS — E611 Iron deficiency: Secondary | ICD-10-CM

## 2022-07-28 DIAGNOSIS — Z800 Family history of malignant neoplasm of digestive organs: Secondary | ICD-10-CM | POA: Insufficient documentation

## 2022-07-28 DIAGNOSIS — C499 Malignant neoplasm of connective and soft tissue, unspecified: Secondary | ICD-10-CM

## 2022-07-28 DIAGNOSIS — R634 Abnormal weight loss: Secondary | ICD-10-CM

## 2022-07-28 DIAGNOSIS — Z1589 Genetic susceptibility to other disease: Secondary | ICD-10-CM

## 2022-07-28 DIAGNOSIS — Z9889 Other specified postprocedural states: Secondary | ICD-10-CM | POA: Insufficient documentation

## 2022-07-28 DIAGNOSIS — D509 Iron deficiency anemia, unspecified: Secondary | ICD-10-CM

## 2022-07-28 DIAGNOSIS — C480 Malignant neoplasm of retroperitoneum: Secondary | ICD-10-CM | POA: Insufficient documentation

## 2022-07-28 DIAGNOSIS — Z809 Family history of malignant neoplasm, unspecified: Secondary | ICD-10-CM | POA: Insufficient documentation

## 2022-07-28 DIAGNOSIS — E7212 Methylenetetrahydrofolate reductase deficiency: Secondary | ICD-10-CM | POA: Insufficient documentation

## 2022-07-28 LAB — RETICULOCYTE COUNT
IMMATURE RETIC FRACTION: 12.9 % (ref 2.7–15.9)
RETICULOCYTE % AUTOMATED: 1.38 % (ref 0.50–2.20)
RETICULOCYTE HEMOGLOBIN EQUIVALENT: 34.9 pg (ref 28.0–38.0)
RETICULOCYTES COUNT # AUTOMATED: 47.9 10*3/uL (ref 17.0–100.0)

## 2022-07-28 LAB — CBC WITH DIFF
BASOPHIL #: <0.1 10*3/uL (ref <=0.20)
BASOPHIL %: 1 %
EOSINOPHIL #: 0.42 10*3/uL (ref <=0.50)
EOSINOPHIL %: 8 %
HCT: 34.4 % — ABNORMAL LOW (ref 34.8–46.0)
HGB: 11.1 g/dL — ABNORMAL LOW (ref 11.5–16.0)
IMMATURE GRANULOCYTE #: <0.1 10*3/uL (ref <0.10)
IMMATURE GRANULOCYTE %: 0 % (ref 0.0–1.0)
LYMPHOCYTE #: 1.48 10*3/uL (ref 1.00–4.80)
LYMPHOCYTE %: 27 %
MCH: 32 pg (ref 26.0–32.0)
MCHC: 32.3 g/dL (ref 31.0–35.5)
MCV: 99.1 fL (ref 78.0–100.0)
MONOCYTE #: 0.76 10*3/uL (ref 0.20–1.10)
MONOCYTE %: 14 %
MPV: 12 fL (ref 8.7–12.5)
NEUTROPHIL #: 2.71 10*3/uL (ref 1.50–7.70)
NEUTROPHIL %: 50 %
PLATELETS: 256 10*3/uL (ref 150–400)
RBC: 3.47 10*6/uL — ABNORMAL LOW (ref 3.85–5.22)
RDW-CV: 14.3 % (ref 11.5–15.5)
WBC: 5.4 10*3/uL (ref 3.7–11.0)

## 2022-07-28 LAB — VITAMIN B12: VITAMIN B 12: 742 pg/mL (ref 200–900)

## 2022-07-28 LAB — IRON TRANSFERRIN AND TIBC
IRON (TRANSFERRIN) SATURATION: 21 % (ref 20–50)
IRON: 82 ug/dL (ref 45–170)
TOTAL IRON BINDING CAPACITY: 398 ug/dL (ref 224–476)
TRANSFERRIN: 284 mg/dL (ref 160–340)

## 2022-07-28 LAB — FERRITIN: FERRITIN: 27 ng/mL (ref 5–200)

## 2022-07-28 LAB — FOLATE: FOLATE: 15.9 ng/mL (ref 7.0–31.0)

## 2022-07-29 ENCOUNTER — Encounter (HOSPITAL_COMMUNITY): Payer: Self-pay | Admitting: Hematology & Oncology
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
## 2022-09-27 ENCOUNTER — Other Ambulatory Visit: Payer: Medicare Other | Attending: PULMONARY DISEASE

## 2022-09-27 ENCOUNTER — Other Ambulatory Visit: Payer: Self-pay

## 2022-09-27 DIAGNOSIS — C499 Malignant neoplasm of connective and soft tissue, unspecified: Secondary | ICD-10-CM | POA: Insufficient documentation

## 2022-09-27 LAB — CBC WITH DIFF
BASOPHIL #: <0.1 10*3/uL (ref <=0.20)
BASOPHIL %: 1 %
EOSINOPHIL #: 0.24 10*3/uL (ref <=0.50)
EOSINOPHIL %: 6 %
HCT: 39.4 % (ref 34.8–46.0)
HGB: 12.8 g/dL (ref 11.5–16.0)
IMMATURE GRANULOCYTE #: <0.1 10*3/uL (ref <0.10)
IMMATURE GRANULOCYTE %: 0 % (ref 0.0–1.0)
LYMPHOCYTE #: 1.56 10*3/uL (ref 1.00–4.80)
LYMPHOCYTE %: 38 %
MCH: 32.5 pg — ABNORMAL HIGH (ref 26.0–32.0)
MCHC: 32.5 g/dL (ref 31.0–35.5)
MCV: 100 fL (ref 78.0–100.0)
MONOCYTE #: 0.36 10*3/uL (ref 0.20–1.10)
MONOCYTE %: 9 %
MPV: 11.5 fL (ref 8.7–12.5)
NEUTROPHIL #: 1.89 10*3/uL (ref 1.50–7.70)
NEUTROPHIL %: 46 %
PLATELETS: 233 10*3/uL (ref 150–400)
RBC: 3.94 10*6/uL (ref 3.85–5.22)
RDW-CV: 15 % (ref 11.5–15.5)
WBC: 4.1 10*3/uL (ref 3.7–11.0)

## 2022-09-27 LAB — COMPREHENSIVE METABOLIC PNL, FASTING
ALBUMIN: 3.9 g/dL (ref 3.4–4.8)
ALKALINE PHOSPHATASE: 49 U/L — ABNORMAL LOW (ref 55–145)
ALT (SGPT): 22 U/L (ref 8–22)
ANION GAP: 10 mmol/L (ref 4–13)
AST (SGOT): 34 U/L (ref 8–45)
BILIRUBIN TOTAL: 0.4 mg/dL (ref 0.3–1.3)
BUN/CREA RATIO: 30 — ABNORMAL HIGH (ref 6–22)
BUN: 27 mg/dL — ABNORMAL HIGH (ref 8–25)
CALCIUM: 9.2 mg/dL (ref 8.6–10.3)
CHLORIDE: 108 mmol/L (ref 96–111)
CO2 TOTAL: 22 mmol/L — ABNORMAL LOW (ref 23–31)
CREATININE: 0.91 mg/dL (ref 0.60–1.05)
ESTIMATED GFR - FEMALE: 67 mL/min/BSA (ref >=60)
GLUCOSE: 94 mg/dL (ref 70–99)
POTASSIUM: 4.9 mmol/L (ref 3.5–5.1)
PROTEIN TOTAL: 7.6 g/dL (ref 6.0–8.0)
SODIUM: 140 mmol/L (ref 136–145)

## 2022-09-27 LAB — PT/INR
INR: 0.88
PROTHROMBIN TIME: 10.1 seconds (ref 9.2–12.3)

## 2022-10-27 ENCOUNTER — Ambulatory Visit (HOSPITAL_COMMUNITY): Payer: Self-pay | Admitting: Hematology & Oncology

## 2022-11-21 ENCOUNTER — Ambulatory Visit (INDEPENDENT_AMBULATORY_CARE_PROVIDER_SITE_OTHER): Payer: Self-pay | Admitting: Internal Medicine

## 2023-01-24 ENCOUNTER — Other Ambulatory Visit: Payer: Self-pay

## 2023-01-24 ENCOUNTER — Encounter (INDEPENDENT_AMBULATORY_CARE_PROVIDER_SITE_OTHER): Payer: Self-pay | Admitting: Internal Medicine

## 2023-01-24 ENCOUNTER — Other Ambulatory Visit: Payer: Medicare Other | Attending: Internal Medicine

## 2023-01-24 ENCOUNTER — Ambulatory Visit: Payer: Medicare Other | Attending: Internal Medicine | Admitting: Internal Medicine

## 2023-01-24 VITALS — BP 124/58 | HR 77 | Temp 97.5°F | Ht 66.0 in | Wt 167.4 lb

## 2023-01-24 DIAGNOSIS — G8929 Other chronic pain: Secondary | ICD-10-CM

## 2023-01-24 DIAGNOSIS — Z91030 Bee allergy status: Secondary | ICD-10-CM | POA: Insufficient documentation

## 2023-01-24 DIAGNOSIS — N39490 Overflow incontinence: Secondary | ICD-10-CM

## 2023-01-24 DIAGNOSIS — Z85118 Personal history of other malignant neoplasm of bronchus and lung: Secondary | ICD-10-CM | POA: Insufficient documentation

## 2023-01-24 DIAGNOSIS — F419 Anxiety disorder, unspecified: Secondary | ICD-10-CM | POA: Insufficient documentation

## 2023-01-24 DIAGNOSIS — C785 Secondary malignant neoplasm of large intestine and rectum: Secondary | ICD-10-CM | POA: Insufficient documentation

## 2023-01-24 DIAGNOSIS — M8588 Other specified disorders of bone density and structure, other site: Secondary | ICD-10-CM | POA: Insufficient documentation

## 2023-01-24 DIAGNOSIS — C3491 Malignant neoplasm of unspecified part of right bronchus or lung: Secondary | ICD-10-CM | POA: Insufficient documentation

## 2023-01-24 DIAGNOSIS — E7212 Methylenetetrahydrofolate reductase deficiency: Secondary | ICD-10-CM | POA: Insufficient documentation

## 2023-01-24 DIAGNOSIS — Z0000 Encounter for general adult medical examination without abnormal findings: Secondary | ICD-10-CM

## 2023-01-24 DIAGNOSIS — M8580 Other specified disorders of bone density and structure, unspecified site: Secondary | ICD-10-CM | POA: Insufficient documentation

## 2023-01-24 DIAGNOSIS — Z9109 Other allergy status, other than to drugs and biological substances: Secondary | ICD-10-CM

## 2023-01-24 DIAGNOSIS — M461 Sacroiliitis, not elsewhere classified: Secondary | ICD-10-CM | POA: Insufficient documentation

## 2023-01-24 DIAGNOSIS — M25561 Pain in right knee: Secondary | ICD-10-CM | POA: Insufficient documentation

## 2023-01-24 DIAGNOSIS — Z1589 Genetic susceptibility to other disease: Secondary | ICD-10-CM | POA: Insufficient documentation

## 2023-01-24 DIAGNOSIS — G43009 Migraine without aura, not intractable, without status migrainosus: Secondary | ICD-10-CM | POA: Insufficient documentation

## 2023-01-24 LAB — CREATININE WITH EGFR
CREATININE: 0.9 mg/dL (ref 0.60–1.05)
ESTIMATED GFR - FEMALE: 68 mL/min/BSA (ref >=60)

## 2023-01-24 LAB — CBC W/AUTO DIFF
BASOPHIL #: <0.1 10*3/uL (ref <=0.20)
BASOPHIL %: 1 %
EOSINOPHIL #: 0.19 10*3/uL (ref <=0.50)
EOSINOPHIL %: 3 %
HCT: 37 % (ref 34.8–46.0)
HGB: 11.9 g/dL (ref 11.5–16.0)
IMMATURE GRANULOCYTE #: <0.1 10*3/uL (ref <0.10)
IMMATURE GRANULOCYTE %: 0 % (ref 0.0–1.0)
LYMPHOCYTE #: 1.51 10*3/uL (ref 1.00–4.80)
LYMPHOCYTE %: 27 %
MCH: 32.2 pg — ABNORMAL HIGH (ref 26.0–32.0)
MCHC: 32.2 g/dL (ref 31.0–35.5)
MCV: 100 fL (ref 78.0–100.0)
MONOCYTE #: 0.68 10*3/uL (ref 0.20–1.10)
MONOCYTE %: 12 %
MPV: 12.9 fL — ABNORMAL HIGH (ref 8.7–12.5)
NEUTROPHIL #: 3.16 10*3/uL (ref 1.50–7.70)
NEUTROPHIL %: 57 %
PLATELETS: 226 10*3/uL (ref 150–400)
RBC: 3.7 10*6/uL — ABNORMAL LOW (ref 3.85–5.22)
RDW-CV: 12.9 % (ref 11.5–15.5)
WBC: 5.6 10*3/uL (ref 3.7–11.0)

## 2023-01-24 LAB — BUN: BUN: 43 mg/dL — ABNORMAL HIGH (ref 8–25)

## 2023-01-24 MED ORDER — ESCITALOPRAM 10 MG TABLET
10.0000 mg | ORAL_TABLET | Freq: Every day | ORAL | 3 refills | Status: DC
Start: 2023-01-24 — End: 2024-01-16

## 2023-01-24 MED ORDER — MYRBETRIQ 50 MG TABLET,EXTENDED RELEASE
1.0000 | ORAL_TABLET | Freq: Every day | ORAL | 3 refills | Status: DC
Start: 2023-01-24 — End: 2023-07-23

## 2023-01-24 MED ORDER — EPINEPHRINE 0.3 MG/0.3 ML INJECTION, AUTO-INJECTOR
0.3000 mg | AUTO-INJECTOR | Freq: Once | INTRAMUSCULAR | 1 refills | Status: DC | PRN
Start: 2023-01-24 — End: 2023-10-05

## 2023-01-24 NOTE — Addendum Note (Signed)
Addended by: Jewel Baize on: 01/24/2023 02:05 PM     Modules accepted: Orders
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
## 2023-01-24 NOTE — Nursing Note (Signed)
BP (!) 124/58 (Site: Left Arm, Patient Position: Sitting, Cuff Size: Adult)   Pulse 77   Temp 36.4 C (97.5 F) (Thermal Scan)   Ht 1.676 m (5\' 6" )   Wt 75.9 kg (167 lb 6.4 oz)   SpO2 97%   BMI 27.02 kg/m   Renato Gails, [place]

## 2023-01-24 NOTE — Addendum Note (Signed)
Addended by: Jewel Baize on: 01/24/2023 02:19 PM     Modules accepted: Orders

## 2023-01-25 ENCOUNTER — Encounter (HOSPITAL_COMMUNITY): Payer: Self-pay | Admitting: Hematology & Oncology

## 2023-02-14 ENCOUNTER — Encounter (HOSPITAL_COMMUNITY): Payer: Self-pay | Admitting: Hematology & Oncology

## 2023-02-16 ENCOUNTER — Ambulatory Visit
Admission: RE | Admit: 2023-02-16 | Discharge: 2023-02-16 | Disposition: A | Discharge status: Home / Self Care | Payer: Medicare Other | Source: Ambulatory Visit | Attending: Hematology & Oncology | Admitting: Hematology & Oncology

## 2023-02-16 ENCOUNTER — Encounter (HOSPITAL_COMMUNITY): Payer: Self-pay | Admitting: Hematology & Oncology

## 2023-02-16 ENCOUNTER — Ambulatory Visit (HOSPITAL_BASED_OUTPATIENT_CLINIC_OR_DEPARTMENT_OTHER): Payer: Medicare Other | Admitting: Hematology & Oncology

## 2023-02-16 ENCOUNTER — Other Ambulatory Visit: Payer: Self-pay

## 2023-02-16 VITALS — BP 110/57 | HR 71 | Temp 97.5°F | Resp 16 | Ht 66.54 in | Wt 169.0 lb

## 2023-02-16 DIAGNOSIS — C3491 Malignant neoplasm of unspecified part of right bronchus or lung: Secondary | ICD-10-CM

## 2023-02-16 DIAGNOSIS — Z1589 Genetic susceptibility to other disease: Secondary | ICD-10-CM | POA: Insufficient documentation

## 2023-02-16 DIAGNOSIS — Z8589 Personal history of malignant neoplasm of other organs and systems: Secondary | ICD-10-CM | POA: Insufficient documentation

## 2023-02-16 DIAGNOSIS — C3411 Malignant neoplasm of upper lobe, right bronchus or lung: Secondary | ICD-10-CM | POA: Insufficient documentation

## 2023-02-16 DIAGNOSIS — C499 Malignant neoplasm of connective and soft tissue, unspecified: Secondary | ICD-10-CM

## 2023-02-16 DIAGNOSIS — Z905 Acquired absence of kidney: Secondary | ICD-10-CM | POA: Insufficient documentation

## 2023-02-16 DIAGNOSIS — D508 Other iron deficiency anemias: Secondary | ICD-10-CM | POA: Insufficient documentation

## 2023-02-16 DIAGNOSIS — R5383 Other fatigue: Secondary | ICD-10-CM | POA: Insufficient documentation

## 2023-02-16 DIAGNOSIS — Z9081 Acquired absence of spleen: Secondary | ICD-10-CM | POA: Insufficient documentation

## 2023-02-16 DIAGNOSIS — Z90411 Acquired partial absence of pancreas: Secondary | ICD-10-CM | POA: Insufficient documentation

## 2023-02-16 DIAGNOSIS — Z8719 Personal history of other diseases of the digestive system: Secondary | ICD-10-CM | POA: Insufficient documentation

## 2023-02-16 DIAGNOSIS — D509 Iron deficiency anemia, unspecified: Secondary | ICD-10-CM

## 2023-02-16 DIAGNOSIS — Z902 Acquired absence of lung [part of]: Secondary | ICD-10-CM | POA: Insufficient documentation

## 2023-02-16 DIAGNOSIS — Z9049 Acquired absence of other specified parts of digestive tract: Secondary | ICD-10-CM | POA: Insufficient documentation

## 2023-02-16 LAB — IRON TRANSFERRIN AND TIBC
IRON (TRANSFERRIN) SATURATION: 20 % (ref 20–50)
IRON: 81 ug/dL (ref 45–170)
TOTAL IRON BINDING CAPACITY: 396 ug/dL (ref 224–476)
TRANSFERRIN: 283 mg/dL (ref 160–340)

## 2023-02-16 LAB — FERRITIN: FERRITIN: 39 ng/mL (ref 5–200)

## 2023-02-16 LAB — THYROID STIMULATING HORMONE WITH FREE T4 REFLEX: TSH: 0.95 u[IU]/mL (ref 0.350–4.940)
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
## 2023-02-17 ENCOUNTER — Encounter (HOSPITAL_COMMUNITY): Payer: Self-pay | Admitting: Hematology & Oncology

## 2023-04-06 ENCOUNTER — Ambulatory Visit: Payer: Medicare Other | Attending: Internal Medicine | Admitting: Internal Medicine

## 2023-04-06 ENCOUNTER — Ambulatory Visit: Payer: Medicare Other | Attending: Internal Medicine

## 2023-04-06 ENCOUNTER — Other Ambulatory Visit: Payer: Self-pay

## 2023-04-06 ENCOUNTER — Encounter (INDEPENDENT_AMBULATORY_CARE_PROVIDER_SITE_OTHER): Payer: Self-pay | Admitting: Internal Medicine

## 2023-04-06 VITALS — BP 118/70 | HR 76 | Temp 96.0°F | Ht 66.54 in | Wt 157.6 lb

## 2023-04-06 DIAGNOSIS — R634 Abnormal weight loss: Secondary | ICD-10-CM | POA: Insufficient documentation

## 2023-04-06 DIAGNOSIS — Z23 Encounter for immunization: Secondary | ICD-10-CM | POA: Insufficient documentation

## 2023-04-06 DIAGNOSIS — R5383 Other fatigue: Secondary | ICD-10-CM | POA: Insufficient documentation

## 2023-04-06 DIAGNOSIS — C3491 Malignant neoplasm of unspecified part of right bronchus or lung: Secondary | ICD-10-CM | POA: Insufficient documentation

## 2023-04-06 DIAGNOSIS — M25561 Pain in right knee: Secondary | ICD-10-CM | POA: Insufficient documentation

## 2023-04-06 DIAGNOSIS — G8929 Other chronic pain: Secondary | ICD-10-CM | POA: Insufficient documentation

## 2023-04-06 DIAGNOSIS — Z1589 Genetic susceptibility to other disease: Secondary | ICD-10-CM | POA: Insufficient documentation

## 2023-04-06 LAB — COMPREHENSIVE METABOLIC PANEL, NON-FASTING
ALBUMIN: 3.9 g/dL (ref 3.4–4.8)
ALKALINE PHOSPHATASE: 59 U/L (ref 55–145)
ALT (SGPT): 19 U/L (ref 8–22)
ANION GAP: 7 mmol/L (ref 4–13)
AST (SGOT): 25 U/L (ref 8–45)
BILIRUBIN TOTAL: 0.5 mg/dL (ref 0.3–1.3)
BUN/CREA RATIO: 42 — ABNORMAL HIGH (ref 6–22)
BUN: 35 mg/dL — ABNORMAL HIGH (ref 8–25)
CALCIUM: 9.9 mg/dL (ref 8.6–10.3)
CHLORIDE: 105 mmol/L (ref 96–111)
CO2 TOTAL: 26 mmol/L (ref 23–31)
CREATININE: 0.83 mg/dL (ref 0.60–1.05)
ESTIMATED GFR - FEMALE: 74 mL/min/BSA (ref >=60)
GLUCOSE: 83 mg/dL (ref 65–125)
POTASSIUM: 5.1 mmol/L (ref 3.5–5.1)
PROTEIN TOTAL: 7.2 g/dL (ref 6.0–8.0)
SODIUM: 138 mmol/L (ref 136–145)

## 2023-04-06 LAB — CBC W/AUTO DIFF
BASOPHIL #: <0.1 10*3/uL (ref <=0.20)
BASOPHIL %: 0.6 %
EOSINOPHIL #: 0.24 10*3/uL (ref <=0.50)
EOSINOPHIL %: 3.6 %
HCT: 34.3 % — ABNORMAL LOW (ref 34.8–46.0)
HGB: 11.1 g/dL — ABNORMAL LOW (ref 11.5–16.0)
IMMATURE GRANULOCYTE #: <0.1 10*3/uL (ref <0.10)
IMMATURE GRANULOCYTE %: 0.3 % (ref 0.0–1.0)
LYMPHOCYTE #: 1.65 10*3/uL (ref 1.00–4.80)
LYMPHOCYTE %: 24.8 %
MCH: 32.8 pg — ABNORMAL HIGH (ref 26.0–32.0)
MCHC: 32.4 g/dL (ref 31.0–35.5)
MCV: 101.5 fL — ABNORMAL HIGH (ref 78.0–100.0)
MONOCYTE #: 0.65 10*3/uL (ref 0.20–1.10)
MONOCYTE %: 9.8 %
MPV: 12.8 fL — ABNORMAL HIGH (ref 8.7–12.5)
NEUTROPHIL #: 4.04 10*3/uL (ref 1.50–7.70)
NEUTROPHIL %: 60.9 %
PLATELETS: 217 10*3/uL (ref 150–400)
RBC: 3.38 10*6/uL — ABNORMAL LOW (ref 3.85–5.22)
RDW-CV: 14 % (ref 11.5–15.5)
WBC: 6.6 10*3/uL (ref 3.7–11.0)

## 2023-04-06 NOTE — Nursing Note (Signed)
BP 118/70   Pulse 76   Temp (!) 35.6 C (96 F) (Thermal Scan)   Ht 1.69 m (5' 6.54")   Wt 71.5 kg (157 lb 9.6 oz)   SpO2 98%   BMI 25.03 kg/m     Cam Hai, CMA
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
## 2023-04-06 NOTE — Nursing Note (Signed)
Patient given Flu vaccine, tolerated well.   VIS given.    Cam Hai, CMA

## 2023-04-06 NOTE — Nursing Note (Signed)
04/06/23 1255   PHQ 9 (follow up)   Little interest or pleasure in doing things. 0   Feeling down, depressed, or hopeless 0   Trouble falling or staying asleep, or sleeping too much. 0   Feeling tired or having little energy 3   Poor appetite or overeating 0   Feeling bad about yourself/ that you are a failure in the past 2 weeks? 0   Trouble concentrating on things in the past 2 weeks? 0   Moving/Speaking slowly or being fidgety or restless  in the past 2 weeks? 0   Thoughts that you would be better off DEAD, or of hurting yourself in some way. 0   If you checked off any problems, how difficult have these problems made it for you to do your work, take care of things at home, or get along with other people? Not difficult at all   PHQ 9 Total 3   Interpretation of Total Score No depression

## 2023-04-10 ENCOUNTER — Encounter (INDEPENDENT_AMBULATORY_CARE_PROVIDER_SITE_OTHER): Payer: Self-pay | Admitting: Internal Medicine

## 2023-05-05 ENCOUNTER — Other Ambulatory Visit (INDEPENDENT_AMBULATORY_CARE_PROVIDER_SITE_OTHER): Payer: Self-pay | Admitting: Internal Medicine

## 2023-05-05 DIAGNOSIS — Z9109 Other allergy status, other than to drugs and biological substances: Secondary | ICD-10-CM

## 2023-05-09 ENCOUNTER — Ambulatory Visit (INDEPENDENT_AMBULATORY_CARE_PROVIDER_SITE_OTHER): Payer: Self-pay | Admitting: Internal Medicine

## 2023-06-14 ENCOUNTER — Ambulatory Visit (INDEPENDENT_AMBULATORY_CARE_PROVIDER_SITE_OTHER): Payer: Self-pay | Admitting: Internal Medicine

## 2023-06-15 ENCOUNTER — Other Ambulatory Visit (INDEPENDENT_AMBULATORY_CARE_PROVIDER_SITE_OTHER): Payer: Self-pay | Admitting: Internal Medicine

## 2023-06-15 DIAGNOSIS — M8580 Other specified disorders of bone density and structure, unspecified site: Secondary | ICD-10-CM

## 2023-06-19 ENCOUNTER — Other Ambulatory Visit (INDEPENDENT_AMBULATORY_CARE_PROVIDER_SITE_OTHER): Payer: Self-pay | Admitting: Internal Medicine

## 2023-06-19 ENCOUNTER — Encounter (HOSPITAL_COMMUNITY): Payer: Self-pay

## 2023-06-19 DIAGNOSIS — Z780 Asymptomatic menopausal state: Secondary | ICD-10-CM

## 2023-06-21 ENCOUNTER — Ambulatory Visit: Payer: Medicare Other | Attending: Internal Medicine

## 2023-06-21 ENCOUNTER — Ambulatory Visit (HOSPITAL_COMMUNITY): Payer: Self-pay

## 2023-06-21 ENCOUNTER — Other Ambulatory Visit: Payer: Self-pay

## 2023-06-21 DIAGNOSIS — Z780 Asymptomatic menopausal state: Secondary | ICD-10-CM | POA: Insufficient documentation

## 2023-06-21 DIAGNOSIS — Z7983 Long term (current) use of bisphosphonates: Secondary | ICD-10-CM

## 2023-07-23 ENCOUNTER — Other Ambulatory Visit: Payer: Self-pay

## 2023-07-23 ENCOUNTER — Ambulatory Visit: Payer: Medicare Other | Attending: Internal Medicine | Admitting: Internal Medicine

## 2023-07-23 ENCOUNTER — Encounter (INDEPENDENT_AMBULATORY_CARE_PROVIDER_SITE_OTHER): Payer: Self-pay | Admitting: Internal Medicine

## 2023-07-23 VITALS — BP 122/62 | HR 76 | Temp 97.8°F | Resp 16 | Ht 66.54 in | Wt 152.0 lb

## 2023-07-23 DIAGNOSIS — F411 Generalized anxiety disorder: Secondary | ICD-10-CM | POA: Insufficient documentation

## 2023-07-23 DIAGNOSIS — Z1589 Genetic susceptibility to other disease: Secondary | ICD-10-CM | POA: Insufficient documentation

## 2023-07-23 DIAGNOSIS — N39498 Other specified urinary incontinence: Secondary | ICD-10-CM | POA: Insufficient documentation

## 2023-07-23 DIAGNOSIS — M818 Other osteoporosis without current pathological fracture: Secondary | ICD-10-CM | POA: Insufficient documentation

## 2023-07-23 DIAGNOSIS — R778 Other specified abnormalities of plasma proteins: Secondary | ICD-10-CM | POA: Insufficient documentation

## 2023-07-23 DIAGNOSIS — R5383 Other fatigue: Secondary | ICD-10-CM | POA: Insufficient documentation

## 2023-07-23 MED ORDER — MYRBETRIQ 50 MG TABLET,EXTENDED RELEASE
50.0000 mg | ORAL_TABLET | Freq: Every day | ORAL | 0 refills | Status: DC
Start: 2023-07-23 — End: 2024-03-10
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
## 2023-07-23 NOTE — Nursing Note (Signed)
07/23/23 1241   Health Education and Literacy   How often do you have a problem understanding what is told to you about your medical condition?  Never   Domestic Violence   Because we are aware of abuse and domestic violence today, we ask all patients: Are you being hurt, hit, or frightened by anyone at your home or in your life?  N   Basic Needs   Do you have any basic needs within your home that are not being met? (such as Food, Shelter, Civil Service fast streamer, Tranportation, paying for bills and/or medications) N   Advanced Directives   Do you have any advanced directives? No Advance   Would you like an advanced directive packet? Refused Packet

## 2023-07-23 NOTE — Nursing Note (Signed)
07/23/23 1242   Fall Risk Assessment   Do you feel unsteady when standing or walking? Yes   Do you worry about falling? No   Have you fallen in the past year? Yes   How many times have you fallen? Once   Were you ever injured from falling? No   Timed up and go test (in seconds) 5

## 2023-07-23 NOTE — Nursing Note (Signed)
07/23/23 1243   PHQ 9 (follow up)   Little interest or pleasure in doing things. 1   Feeling down, depressed, or hopeless 0   Trouble falling or staying asleep, or sleeping too much. 1   Feeling tired or having little energy 3   Poor appetite or overeating 1   Feeling bad about yourself/ that you are a failure in the past 2 weeks? 0   Trouble concentrating on things in the past 2 weeks? 0   Moving/Speaking slowly or being fidgety or restless  in the past 2 weeks? 1   Thoughts that you would be better off DEAD, or of hurting yourself in some way. 0   PHQ 9 Total 7   Interpretation of Total Score Mild depression

## 2023-07-23 NOTE — Nursing Note (Signed)
BP 122/62 (Site: Left Arm, Patient Position: Sitting, Cuff Size: Adult Small)   Pulse 76   Temp 36.6 C (97.8 F) (Thermal Scan)   Resp 16   Ht 1.69 m (5' 6.54")   Wt 68.9 kg (152 lb)   SpO2 97%   BMI 24.14 kg/m   Lenell Antu, CMA

## 2023-08-14 ENCOUNTER — Telehealth (HOSPITAL_COMMUNITY): Payer: Self-pay | Admitting: Internal Medicine

## 2023-08-14 NOTE — Telephone Encounter (Signed)
 Patient called is having Spinal surgery at [facility] 09/03/23 and is needing a pre op clearance, EKG and blood work  completed before then. No appointments available in that time frame. Please reach out to patient at [phone] to schedule. Please advise, thank you.    [employer]  08/14/2023, 14:18

## 2023-08-15 ENCOUNTER — Ambulatory Visit (INDEPENDENT_AMBULATORY_CARE_PROVIDER_SITE_OTHER): Payer: Self-pay | Admitting: Primary Care

## 2023-08-17 ENCOUNTER — Ambulatory Visit (HOSPITAL_COMMUNITY): Payer: Self-pay | Admitting: Hematology & Oncology

## 2023-08-20 ENCOUNTER — Other Ambulatory Visit: Payer: Self-pay

## 2023-08-20 ENCOUNTER — Encounter (INDEPENDENT_AMBULATORY_CARE_PROVIDER_SITE_OTHER): Payer: Self-pay | Admitting: Primary Care

## 2023-08-20 ENCOUNTER — Ambulatory Visit: Payer: Self-pay | Attending: Primary Care | Admitting: Primary Care

## 2023-08-20 ENCOUNTER — Ambulatory Visit: Attending: Internal Medicine

## 2023-08-20 VITALS — BP 124/70 | HR 82 | Temp 97.3°F | Ht 66.5 in

## 2023-08-20 DIAGNOSIS — R739 Hyperglycemia, unspecified: Secondary | ICD-10-CM

## 2023-08-20 DIAGNOSIS — Z01818 Encounter for other preprocedural examination: Secondary | ICD-10-CM | POA: Insufficient documentation

## 2023-08-20 DIAGNOSIS — I4891 Unspecified atrial fibrillation: Secondary | ICD-10-CM | POA: Insufficient documentation

## 2023-08-20 LAB — CBC WITH DIFF
BASOPHIL #: <0.1 10*3/uL (ref <=0.20)
BASOPHIL %: 0.8 %
EOSINOPHIL #: 0.33 10*3/uL (ref <=0.50)
EOSINOPHIL %: 5.3 %
HCT: 33.9 % — ABNORMAL LOW (ref 34.8–46.0)
HGB: 11 g/dL — ABNORMAL LOW (ref 11.5–16.0)
IMMATURE GRANULOCYTE #: <0.1 10*3/uL (ref <0.10)
IMMATURE GRANULOCYTE %: 0.3 % (ref 0.0–1.0)
LYMPHOCYTE #: 1.38 10*3/uL (ref 1.00–4.80)
LYMPHOCYTE %: 22.1 %
MCH: 33.1 pg — ABNORMAL HIGH (ref 26.0–32.0)
MCHC: 32.4 g/dL (ref 31.0–35.5)
MCV: 102.1 fL — ABNORMAL HIGH (ref 78.0–100.0)
MONOCYTE #: 0.56 10*3/uL (ref 0.20–1.10)
MONOCYTE %: 9 %
MPV: 12 fL (ref 8.7–12.5)
NEUTROPHIL #: 3.91 10*3/uL (ref 1.50–7.70)
NEUTROPHIL %: 62.5 %
PLATELETS: 197 10*3/uL (ref 150–400)
RBC: 3.32 10*6/uL — ABNORMAL LOW (ref 3.85–5.22)
RDW-CV: 13.5 % (ref 11.5–15.5)
WBC: 6.3 10*3/uL (ref 3.7–11.0)

## 2023-08-20 LAB — COMPREHENSIVE METABOLIC PNL, FASTING
ALBUMIN: 4 g/dL (ref 3.4–4.8)
ALKALINE PHOSPHATASE: 47 U/L — ABNORMAL LOW (ref 55–145)
ALT (SGPT): 23 U/L (ref <31)
ANION GAP: 6 mmol/L (ref 4–13)
AST (SGOT): 29 U/L (ref 11–34)
BILIRUBIN TOTAL: 0.3 mg/dL (ref 0.3–1.3)
BUN/CREA RATIO: 39 — ABNORMAL HIGH (ref 6–22)
BUN: 32 mg/dL — ABNORMAL HIGH (ref 8–25)
CALCIUM: 9.7 mg/dL (ref 8.6–10.3)
CHLORIDE: 104 mmol/L (ref 96–111)
CO2 TOTAL: 29 mmol/L (ref 23–31)
CREATININE: 0.83 mg/dL (ref 0.60–1.05)
ESTIMATED GFR - FEMALE: 74 mL/min/BSA (ref >=60)
GLUCOSE: 91 mg/dL (ref 70–99)
POTASSIUM: 5 mmol/L (ref 3.5–5.1)
PROTEIN TOTAL: 7.1 g/dL (ref 5.6–7.6)
SODIUM: 139 mmol/L (ref 136–145)

## 2023-08-20 LAB — PT/INR
INR: 0.95
PROTHROMBIN TIME: 10.8 s (ref 9.2–12.3)

## 2023-08-20 NOTE — Nursing Note (Signed)
 08/20/23 1251   Domestic Violence   Because we are aware of abuse and domestic violence today, we ask all patients: Are you being hurt, hit, or frightened by anyone at your home or in your life?  N   Basic Needs   Do you have any basic needs within your home that are not being met? (such as Food, Shelter, Civil Service fast streamer, Tranportation, paying for bills and/or medications) N

## 2023-08-20 NOTE — Nursing Note (Signed)
 BP 124/70 (Site: Left Arm, Patient Position: Sitting, Cuff Size: Adult)   Pulse 82   Temp 36.3 C (97.3 F) (Thermal Scan)   Ht 1.689 m (5' 6.5")   SpO2 95%   BMI 24.17 kg/m   Alroy Dust, CMA

## 2023-08-21 ENCOUNTER — Ambulatory Visit (INDEPENDENT_AMBULATORY_CARE_PROVIDER_SITE_OTHER): Payer: Self-pay | Admitting: Primary Care

## 2023-08-21 LAB — HGA1C (HEMOGLOBIN A1C WITH EST AVG GLUCOSE)
ESTIMATED AVERAGE GLUCOSE: 108 mg/dL
HEMOGLOBIN A1C: 5.4 % (ref 4.0–5.6)

## 2023-08-21 NOTE — Result Encounter Note (Signed)
 Patient seen in clinic for preop physical.  CMP shows adequate kidney liver function.  CBC does show a macrocytic anemia this is normally caused by lack of folate or vitamin B12 and diet.  INR is within normal limits.  Patient was medically cleared for her procedure.
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
## 2023-09-04 ENCOUNTER — Telehealth (INDEPENDENT_AMBULATORY_CARE_PROVIDER_SITE_OTHER): Payer: Self-pay | Admitting: Internal Medicine

## 2023-09-04 ENCOUNTER — Encounter (INDEPENDENT_AMBULATORY_CARE_PROVIDER_SITE_OTHER): Payer: Self-pay

## 2023-09-04 NOTE — Nursing Note (Signed)
 Chart entered to assess TCM contact following 3/31 [facility] discharge. Admission for planned procedure; patient to f/u neurosurgery. TCM deferred.    Janetta Medina, RN

## 2023-09-04 NOTE — Telephone Encounter (Signed)
 [name] with [facility] called into the access center stating she has started home health care.  She has reviewed the patient's medication list, the patient will have PT & OT at home.  Jacobo Masters, Quarry manager  09/04/2023, 13:46

## 2023-09-06 ENCOUNTER — Telehealth (HOSPITAL_COMMUNITY): Payer: Self-pay | Admitting: Internal Medicine

## 2023-09-06 NOTE — Telephone Encounter (Signed)
 FYI: HH RN called in to report BP was 97/54 [facility], but pt states that is her normal.  She also reports pt has staples on site in her back.  Wound looked red around the staple.  Argyle Began, RN  Access Center  09/06/2023, 16:55

## 2023-09-07 NOTE — Telephone Encounter (Signed)
 Spoke with Genevia Kern, told her to monitor her BP and ensure that she is staying hydrated. As far as the redness around her staple site, pt states that she has already reached out to the provider who did procedure and she is now taking keflex  for the redness.     Janette Medley, CMA

## 2023-09-24 ENCOUNTER — Encounter (INDEPENDENT_AMBULATORY_CARE_PROVIDER_SITE_OTHER): Payer: Self-pay

## 2023-09-24 NOTE — Nursing Note (Signed)
 Chart entered to assess TCM contact following 4/19 [facility] discharge. Patient to f/u neurosurgery; TCM deferred.    Janetta Medina, RN

## 2023-10-03 ENCOUNTER — Telehealth (HOSPITAL_COMMUNITY): Payer: Self-pay | Admitting: Internal Medicine

## 2023-10-03 NOTE — Telephone Encounter (Addendum)
 HH nurse calling to report that she just did a resumption of care visit for the patient after she was hospitalized and underwent surgery. Nurse states she did not know the patient still had a surgical site on her back but the the surgical site is open to air, is currently 11cm long, 1cm wide, 0.5cm deep, pt had staples removed and the wound itself is open and draining a moderate amount. Nurse is asking if we can write orders for wound care, she recommended iodoform gauze as the previous surgeon had recommended painting the site with iodine  and covering with foam gauze daily.  Nurse is also only ordered to come M, W, Fri so would need an order to increase number of visits if needing daily wound care. Nurse also asked if she should obtain a wound culture just to double check, but wound does not look infected.  Should patient come in for an early visit to get this examined or would it be possible to  write for wound care and a culture?  Toby Fortune, RN  Access Center  10/03/2023, 14:42

## 2023-10-03 NOTE — Telephone Encounter (Signed)
 Needs appointment

## 2023-10-04 ENCOUNTER — Other Ambulatory Visit: Payer: Self-pay

## 2023-10-04 NOTE — Telephone Encounter (Signed)
 Appt scheduled.    Thanks  Wilkins Hardy, Registration Specialist   [facility]   10/04/2023, 07:54

## 2023-10-05 ENCOUNTER — Other Ambulatory Visit: Payer: Self-pay

## 2023-10-05 ENCOUNTER — Ambulatory Visit: Attending: Internal Medicine | Admitting: Internal Medicine

## 2023-10-05 ENCOUNTER — Encounter (INDEPENDENT_AMBULATORY_CARE_PROVIDER_SITE_OTHER): Payer: Self-pay | Admitting: Internal Medicine

## 2023-10-05 ENCOUNTER — Ambulatory Visit: Attending: Internal Medicine

## 2023-10-05 VITALS — BP 102/66 | HR 88 | Temp 97.7°F | Ht 66.0 in | Wt 144.8 lb

## 2023-10-05 DIAGNOSIS — R0789 Other chest pain: Secondary | ICD-10-CM | POA: Insufficient documentation

## 2023-10-05 DIAGNOSIS — M4326 Fusion of spine, lumbar region: Secondary | ICD-10-CM | POA: Insufficient documentation

## 2023-10-05 DIAGNOSIS — Z91030 Bee allergy status: Secondary | ICD-10-CM | POA: Insufficient documentation

## 2023-10-05 DIAGNOSIS — K5909 Other constipation: Secondary | ICD-10-CM | POA: Insufficient documentation

## 2023-10-05 DIAGNOSIS — E7212 Methylenetetrahydrofolate reductase deficiency: Secondary | ICD-10-CM | POA: Insufficient documentation

## 2023-10-05 DIAGNOSIS — T8131XA Disruption of external operation (surgical) wound, not elsewhere classified, initial encounter: Secondary | ICD-10-CM | POA: Insufficient documentation

## 2023-10-05 MED ORDER — SENNOSIDES 8.6 MG TABLET
8.6000 mg | ORAL_TABLET | Freq: Every evening | ORAL | 3 refills | Status: DC | PRN
Start: 2023-10-05 — End: 2024-01-28

## 2023-10-05 MED ORDER — DOXYCYCLINE HYCLATE 100 MG CAPSULE
100.0000 mg | ORAL_CAPSULE | Freq: Two times a day (BID) | ORAL | 0 refills | Status: AC
Start: 2023-10-05 — End: 2023-10-15

## 2023-10-05 MED ORDER — EPINEPHRINE 0.3 MG/0.3 ML INJECTION, AUTO-INJECTOR
0.3000 mg | AUTO-INJECTOR | Freq: Once | INTRAMUSCULAR | 1 refills | Status: DC | PRN
Start: 2023-10-05 — End: 2023-11-14

## 2023-10-05 NOTE — Nursing Note (Signed)
 BP 102/66 (Site: Left Arm, Patient Position: Sitting, Cuff Size: Adult)   Pulse 88   Temp 36.5 C (97.7 F) (Thermal Scan)   Ht 1.676 m (5\' 6" )   Wt 65.7 kg (144 lb 12.8 oz)   SpO2 97%   BMI 23.37 kg/m   Lonne Roan, BSN, Tree surgeon

## 2023-10-05 NOTE — Nursing Note (Signed)
 10/05/23 1009   Recent Weight Change   Have you had a recent unexplained weight loss or gain? Y   Domestic Violence   Because we are aware of abuse and domestic violence today, we ask all patients: Are you being hurt, hit, or frightened by anyone at your home or in your life?  N   Basic Needs   Do you have any basic needs within your home that are not being met? (such as Food, Shelter, Civil Service fast streamer, Tranportation, paying for bills and/or medications) N     Lonne Roan, BSN, Tree surgeon
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
## 2023-10-07 LAB — WOUND, SUPERFICIAL/NON-STERILE SITE, AEROBIC CULTURE AND GRAM STAIN: WOUND CULTURE: NORMAL
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
## 2023-10-09 ENCOUNTER — Encounter (INDEPENDENT_AMBULATORY_CARE_PROVIDER_SITE_OTHER): Payer: Self-pay | Admitting: Internal Medicine

## 2023-10-09 ENCOUNTER — Ambulatory Visit (INDEPENDENT_AMBULATORY_CARE_PROVIDER_SITE_OTHER): Payer: Self-pay | Admitting: Internal Medicine

## 2023-10-09 NOTE — Result Encounter Note (Signed)
 Dear Jude Norton,     Your wound culture showed normal organisms. We will discuss at your next visit.    [name], MD

## 2023-10-10 ENCOUNTER — Ambulatory Visit (HOSPITAL_COMMUNITY): Admitting: Specialist

## 2023-10-12 ENCOUNTER — Other Ambulatory Visit: Payer: Self-pay

## 2023-10-12 ENCOUNTER — Encounter (INDEPENDENT_AMBULATORY_CARE_PROVIDER_SITE_OTHER): Payer: Self-pay | Admitting: Internal Medicine

## 2023-10-12 ENCOUNTER — Ambulatory Visit: Payer: Self-pay | Attending: Internal Medicine | Admitting: Internal Medicine

## 2023-10-12 VITALS — BP 102/70 | HR 84 | Temp 96.6°F | Wt 141.0 lb

## 2023-10-12 DIAGNOSIS — G43009 Migraine without aura, not intractable, without status migrainosus: Secondary | ICD-10-CM | POA: Insufficient documentation

## 2023-10-12 DIAGNOSIS — T8131XA Disruption of external operation (surgical) wound, not elsewhere classified, initial encounter: Secondary | ICD-10-CM

## 2023-10-12 DIAGNOSIS — C7989 Secondary malignant neoplasm of other specified sites: Secondary | ICD-10-CM | POA: Insufficient documentation

## 2023-10-12 DIAGNOSIS — T8131XD Disruption of external operation (surgical) wound, not elsewhere classified, subsequent encounter: Secondary | ICD-10-CM | POA: Insufficient documentation

## 2023-10-12 DIAGNOSIS — G709 Myoneural disorder, unspecified: Secondary | ICD-10-CM | POA: Insufficient documentation

## 2023-10-12 DIAGNOSIS — K5903 Drug induced constipation: Secondary | ICD-10-CM | POA: Insufficient documentation

## 2023-10-12 HISTORY — DX: Disruption of external operation (surgical) wound, not elsewhere classified, initial encounter: T81.31XA

## 2023-10-12 MED ORDER — LINACLOTIDE 72 MCG CAPSULE
72.0000 ug | ORAL_CAPSULE | Freq: Every day | ORAL | 1 refills | Status: DC | PRN
Start: 2023-10-12 — End: 2024-04-15

## 2023-10-12 MED ORDER — EMGALITY 120 MG/ML SUBCUTANEOUS SYRINGE
INJECTION | SUBCUTANEOUS | 5 refills | Status: DC
Start: 2023-10-12 — End: 2024-02-06
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
## 2023-10-12 NOTE — Nursing Note (Signed)
 BP 102/70 (Site: Left Arm, Patient Position: Sitting, Cuff Size: Adult)   Pulse 84   Temp (!) 35.9 C (96.6 F) (Temporal)   Wt 64 kg (141 lb)   SpO2 99%   BMI 22.76 kg/m

## 2023-10-17 ENCOUNTER — Ambulatory Visit: Attending: Registered Nurse | Admitting: Registered Nurse

## 2023-10-17 ENCOUNTER — Other Ambulatory Visit: Payer: Self-pay

## 2023-10-17 VITALS — BP 98/57 | HR 82 | Temp 96.3°F | Resp 18 | Ht 66.0 in | Wt 155.0 lb

## 2023-10-17 DIAGNOSIS — T8189XS Other complications of procedures, not elsewhere classified, sequela: Secondary | ICD-10-CM

## 2023-10-17 DIAGNOSIS — T8131XD Disruption of external operation (surgical) wound, not elsewhere classified, subsequent encounter: Secondary | ICD-10-CM | POA: Insufficient documentation

## 2023-10-17 DIAGNOSIS — T8131XA Disruption of external operation (surgical) wound, not elsewhere classified, initial encounter: Secondary | ICD-10-CM

## 2023-10-17 HISTORY — DX: Other complications of procedures, not elsewhere classified, sequela: T81.89XS

## 2023-10-17 NOTE — Patient Instructions (Addendum)
 Comments    Home health to see patient 3 days a week next week  patient will be scheduled in wound care every 2 weeks due to patient transportation next appt wound care 10/31/23            Order Questions    Question Answer Comment   Cleanse wound with SOAP AND WATER     Apply to wound  calcium  alginate ag rope or hydrofera blue rope lightly packed into tunnel @ 12 o'clock   Secure dressing with Gauze or border    Tape    Change Dressing Every Other Day      Tip for Including Protein in Your Diet    Hard or semi soft cheese (e.g. Cheddar, Marylou Sobers, brick) Melt on sandwiches, bread, muffins, tortillas, hamburgers, hot dogs, other meats or fish, vegetables, eggs and desserts such as stewed fruit or pies.    Grate and add to soups, sauces, casseroles, vegetable dishes, potatoes, rice noodles or meat loaf    Serve as a snack with crackers or bagels.   Cottage Cheese/Ricotta Cheese Mix with or use to stuff fruits and vegetables    Add to casseroles, spaghetti, noodles or egg dishes such as omelets, scrambled eggs and souffles.    Use in gelatin, pudding type desserts, cheesecake and pancake or waffle batter    Use to stuff crepes, pasta shells or manicotti    Puree and use as a substitute for sour cream   Milk Use in beverages and in cooking    Use in preparing foods , such as hot cereal, soups, cocoa or pudding    Add cream sauces to vegetable and other dishes    Use evaporated milk, evaporated skim milk or sweetened condensed milk instead of milk or water  in recipes   Non-fat Dry Milk Add 1/3 cup nonfat dry powered milk to each cup of regular milk for "double strength" milk.    Add to yogurt and milk drinks such as pasteurized eggnog and milkshakes      Add to scrambled eggs and mashed potatoes    Use in casseroles, meat loaf, hot cereal, breads, muffins, sauces, cream soups, puddings and custards and other milk based desserts.    Caution:  Nonfat dry milk may contain bacteria.  Add nonfat dry milk to a small amount of  boiling water  before adding to beverages or food that will not be cooked prior to use.  These mixed beverages and food items need to be refrigerated promptly and used within 24 hours of the mixing time.   Commercial Protein Supplements Use nutrition supplements such as instant breakfast, Sustacal , Ensure , [employer] , [employer] , [employer]  or [employer] .  These may be purchased in grocery stores or pharmacy departments.    Use instant breakfast powder in milk drinks and desserts such as pudding    Mix with ice cream, milk and fruit or flavorings for a high-protein milk shake    Add powdered protein powder, such as ProCal Supplement , Promod , Pro Source  to foods such as soups, custards,puddings or orange juice or lemonade.   Ice Cream, Yogurt, and Frozen Yogurt Add to carbonated beverage such as ginger ale    Add to milk drinks such a milkshakes    Add to cereals, fruits, gelatin desserts and pies    Blend or whip wit soft or cooked fruits    Sandwich ice cream or frozen yogurt between enriched cake slices, cookies or graham  crackers    Use seasoned yogurt as a dip for fruits, vegetables or chips    Use yogurt in place of sour cream in casseroles   Eggs, Egg Whites and Egg Yolks Add chopped , hard cooked eggs to salads and dressings, vegetables, casseroles and creamed meats    Beat eggs into mashed potatoes, vegetable purees and sauces    Add extra whites to quiches, scrambled eggs, custards, puddings and pancake and [place] toast batter,    Make a rich custard with egg yolks, double strength milk and sugar.    Add extra hard-cooked yolks to deviled egg filling and sandwich spreads    Caution:  Do not serve raw or undercooked egg products die to risk of salmonella bacteria causing illness.    Note:  Use pasteurized liquid eggs if the food will not be thoroughly cooked,  Pasteurized liquid eggs are available in the frozen food section of grocery stores   Nuts. Seeds and Wheat Germ Add to  casseroles, breads, muffins, pancakes, cookies and waffles.    Sprinkle on fruit, cereal, ice cream, yogurt, vegetables, salads and toast as a crunchy topping.    Use in place of breadcrumbs    Blend with parsley or spinach, herbs and cream for a noodle, pasta or vegetable sauce    Roll banana in chopped nuts   Peanut buter Spread on sandwiches, toast, muffins, crackers, waffles, pancakes or fruit slices.    Use as a dip for raw vegetables, such as carrots, cauliflower and celery    Blend with milk drinks and beverages    Swirl through soft ice cream and yogurt    Spread on a banana and then roll banana in crushed cereal or chopped nuts.   Meat and Fish Add chopped cooked meat or fish to vegetables, salads,  casseroles, soups, sauces and biscuit dough.    Use in omelets, souffles, quiches and sandwich fillings.    Add chicken and [place] to [employer] in piecrust or biscuit dough as turnovers    Add to stuffed baked potatoes    Add pureed meat to soups    Eat calves or chicken liver or heart, which are especially good sources of protein, vitamins and minerals   Beans Cook and use dried peas, beans and bean curd (tofu) in soups or add to casseroles, pastas and grain dishes that also contain cheese or meat.    Mash with cheese and milk    Use tofu to make smoothies.   Smoking Cessation resources are available by calling 1-800-QUIT-NOW (260) 708-2269)    Thank you for choosing [facility] for your medical care.  We hope you were satisfied with the care you received.  Please contact the Patient Advocate at [phone] if you have any questions or concerns related to your care.    If you experience a life threatening emergency, call 911 immediately or proceed to the closet ER.     If someone you know is dealing with an addiction issue and seeking help call the HELP4WV Hotline at 1-844-HELP4WV (816-181-0577).      If you need to schedule an appointment such as an ultrasound or an MRI or other advanced  imaging, please call [facility] scheduling at [phone].    Please remember to take ALL your belongings with you when you leave your appointment.      To assist us  in providing quality care, you may receive a patient satisfaction survey in the mail.  Please  take a few minutes to complete the survey, as we value your feedback and look for opportunities to serve you better.

## 2023-10-17 NOTE — Nursing Note (Signed)
 10/17/23 1000   Wound  Incision Lower;Mid Back   Initial Date of Wound Assessment/Initial Time of Wound Assessment: 10/17/23 1003   Wound Approximate Age at First Assessment (Weeks): 3 weeks  Primary Wound Type: Incision  Orientation: Lower;Mid  Location: Back   LDAW Image     Dressing Status Removed   WOUND SITE CLOSURE None   Wound Thickness Full Thickness   State of Healing Non-healing   Periwound Pink   Wound Edges Attached to wound base   Wound Bed slough;epithelial;fibrin   Wound Bed Additional Information 26%-50% slough;26%-50% epithelial;0%-25% fibrin   Wound Length (cm) 3.4 cm   Wound Width (cm) 0.7 cm   Wound Depth (cm) 1.4 cm  (depth on top, bottom is 0.8)   Wound Volume (cm^3) 3.332 cm^3   Wound Surface Area (cm^2) 2.38 cm^2   Wound Undermining No   Wound Tunneling  Yes   Tunneling (Depth (cm)/Location) 3@12    Wound Drainage Amount Moderate   Exudate/Drainage Yellow   Wound Odor Absent   Arrival Dressing Type   (betadine, border)   Wound Cleansed with saline
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
## 2023-10-19 ENCOUNTER — Ambulatory Visit: Attending: Internal Medicine

## 2023-10-19 ENCOUNTER — Other Ambulatory Visit: Payer: Self-pay

## 2023-10-19 DIAGNOSIS — M5136 Other intervertebral disc degeneration, lumbar region with discogenic back pain only: Secondary | ICD-10-CM | POA: Insufficient documentation

## 2023-10-19 DIAGNOSIS — M5416 Radiculopathy, lumbar region: Secondary | ICD-10-CM | POA: Insufficient documentation

## 2023-10-19 DIAGNOSIS — F1190 Opioid use, unspecified, uncomplicated: Secondary | ICD-10-CM | POA: Insufficient documentation

## 2023-10-19 DIAGNOSIS — T8131XA Disruption of external operation (surgical) wound, not elsewhere classified, initial encounter: Secondary | ICD-10-CM | POA: Insufficient documentation

## 2023-10-19 LAB — CBC W/AUTO DIFF
BASOPHIL #: <0.1 10*3/uL (ref <=0.20)
BASOPHIL %: 0.8 %
EOSINOPHIL #: 0.33 10*3/uL (ref <=0.50)
EOSINOPHIL %: 5.3 %
HCT: 34.3 % — ABNORMAL LOW (ref 34.8–46.0)
HGB: 11.2 g/dL — ABNORMAL LOW (ref 11.5–16.0)
IMMATURE GRANULOCYTE #: <0.1 10*3/uL (ref <0.10)
IMMATURE GRANULOCYTE %: 0.3 % (ref 0.0–1.0)
LYMPHOCYTE #: 1.42 10*3/uL (ref 1.00–4.80)
LYMPHOCYTE %: 23 %
MCH: 32.6 pg — ABNORMAL HIGH (ref 26.0–32.0)
MCHC: 32.7 g/dL (ref 31.0–35.5)
MCV: 99.7 fL (ref 78.0–100.0)
MONOCYTE #: 0.65 10*3/uL (ref 0.20–1.10)
MONOCYTE %: 10.5 %
MPV: 11.3 fL (ref 8.7–12.5)
NEUTROPHIL #: 3.7 10*3/uL (ref 1.50–7.70)
NEUTROPHIL %: 60.1 %
PLATELETS: 248 10*3/uL (ref 150–400)
RBC: 3.44 10*6/uL — ABNORMAL LOW (ref 3.85–5.22)
RDW-CV: 13.4 % (ref 11.5–15.5)
WBC: 6.2 10*3/uL (ref 3.7–11.0)

## 2023-10-19 LAB — SEDIMENTATION RATE: ERYTHROCYTE SEDIMENTATION RATE (ESR): 12 mm/h (ref 0–30)

## 2023-10-19 LAB — C-REACTIVE PROTEIN(CRP),INFLAMMATION: CRP INFLAMMATION: 2.7 mg/L (ref <8.0)

## 2023-10-22 ENCOUNTER — Ambulatory Visit (INDEPENDENT_AMBULATORY_CARE_PROVIDER_SITE_OTHER): Payer: Self-pay | Admitting: Internal Medicine

## 2023-10-22 NOTE — Result Encounter Note (Signed)
 Dear Jude Norton,     Your hemoglobin was on the lower side.  Increase the intake of green leafy vegetables. We will discuss at your next visit.    [name], MD

## 2023-10-25 ENCOUNTER — Telehealth (HOSPITAL_COMMUNITY): Payer: Self-pay | Admitting: Registered Nurse

## 2023-10-25 LAB — TOXICOLOGY SCREEN, SERUM
ACETONE: NOT DETECTED
ETHANOL: NOT DETECTED
ISOPROPANOL: NOT DETECTED
METHANOL: NOT DETECTED

## 2023-10-25 NOTE — Telephone Encounter (Signed)
 Left message for pt to return call. She left me a detailed message stating that home health discontinued services. I left a message for home health to see the reasoning.

## 2023-10-31 ENCOUNTER — Other Ambulatory Visit: Payer: Self-pay

## 2023-10-31 ENCOUNTER — Ambulatory Visit: Attending: Registered Nurse | Admitting: Registered Nurse

## 2023-10-31 VITALS — BP 114/72 | HR 82 | Temp 97.2°F | Resp 16

## 2023-10-31 DIAGNOSIS — T8189XS Other complications of procedures, not elsewhere classified, sequela: Secondary | ICD-10-CM

## 2023-10-31 DIAGNOSIS — T8189XD Other complications of procedures, not elsewhere classified, subsequent encounter: Secondary | ICD-10-CM

## 2023-10-31 DIAGNOSIS — T8131XD Disruption of external operation (surgical) wound, not elsewhere classified, subsequent encounter: Secondary | ICD-10-CM | POA: Insufficient documentation

## 2023-10-31 NOTE — Nursing Note (Signed)
 10/31/23 0900   Wound  Incision Lower;Mid Back   Initial Date of Wound Assessment/Initial Time of Wound Assessment: 10/17/23 1003   Wound Approximate Age at First Assessment (Weeks): 3 weeks  Primary Wound Type: Incision  Orientation: Lower;Mid  Location: Back   LDAW Image    Dressing Status Removed   WOUND SITE CLOSURE None   Wound Thickness Full Thickness   State of Healing Non-healing   Periwound Pink   Wound Edges Attached to wound base   Wound Bed slough   Wound Bed Additional Information 76%-100% slough   Wound Length (cm) 1.5 cm   Wound Width (cm) 0.5 cm   Wound Depth (cm) 0.9 cm   Wound Volume (cm^3) 0.675 cm^3   Wound Healing % 80   Wound Surface Area (cm^2) 0.75 cm^2   Wound Tunneling  Yes   Tunneling (Depth (cm)/Location) 3.1@12    Wound Drainage Amount None   Wound Odor Absent   Arrival Dressing Type Open to [facility]

## 2023-10-31 NOTE — Nursing Note (Signed)
 10/31/23 0900   Wound  Incision Lower;Mid Back   Initial Date of Wound Assessment/Initial Time of Wound Assessment: 10/17/23 1003   Wound Approximate Age at First Assessment (Weeks): 3 weeks  Primary Wound Type: Incision  Orientation: Lower;Mid  Location: Back   LDAW Image    Dressing Status Removed   WOUND SITE CLOSURE None   Wound Thickness Full Thickness   State of Healing Non-healing   Periwound Pink   Wound Edges Attached to wound base   Wound Bed slough   Wound Bed Additional Information 76%-100% slough   Wound Length (cm) 1.5 cm   Wound Width (cm) 0.5 cm   Wound Depth (cm) 0.9 cm   Wound Volume (cm^3) 0.675 cm^3   Wound Healing % 80   Wound Surface Area (cm^2) 0.75 cm^2   Wound Tunneling  Yes   Tunneling (Depth (cm)/Location) 3.1@12    Wound Drainage Amount Moderate   Exudate/Drainage Yellow   Wound Odor Absent   Arrival Dressing Type Open to [facility]
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
## 2023-10-31 NOTE — Patient Instructions (Addendum)
 Order Questions    Question Answer Comment   Cleanse wound with SOAP AND WATER     Apply to wound  lightly packed calcium  alginate into tunnel at 12 o'clock   Secure dressing with Border Dressing    Change Dressing Every Other Day        Tip for Including Protein in Your Diet    Hard or semi soft cheese (e.g. Cheddar, Marylou Sobers, brick) Melt on sandwiches, bread, muffins, tortillas, hamburgers, hot dogs, other meats or fish, vegetables, eggs and desserts such as stewed fruit or pies.    Grate and add to soups, sauces, casseroles, vegetable dishes, potatoes, rice noodles or meat loaf    Serve as a snack with crackers or bagels.   Cottage Cheese/Ricotta Cheese Mix with or use to stuff fruits and vegetables    Add to casseroles, spaghetti, noodles or egg dishes such as omelets, scrambled eggs and souffles.    Use in gelatin, pudding type desserts, cheesecake and pancake or waffle batter    Use to stuff crepes, pasta shells or manicotti    Puree and use as a substitute for sour cream   Milk Use in beverages and in cooking    Use in preparing foods , such as hot cereal, soups, cocoa or pudding    Add cream sauces to vegetable and other dishes    Use evaporated milk, evaporated skim milk or sweetened condensed milk instead of milk or water  in recipes   Non-fat Dry Milk Add 1/3 cup nonfat dry powered milk to each cup of regular milk for "double strength" milk.    Add to yogurt and milk drinks such as pasteurized eggnog and milkshakes      Add to scrambled eggs and mashed potatoes    Use in casseroles, meat loaf, hot cereal, breads, muffins, sauces, cream soups, puddings and custards and other milk based desserts.    Caution:  Nonfat dry milk may contain bacteria.  Add nonfat dry milk to a small amount of boiling water  before adding to beverages or food that will not be cooked prior to use.  These mixed beverages and food items need to be refrigerated promptly and used within 24 hours of the mixing time.   Commercial Protein  Supplements Use nutrition supplements such as instant breakfast, Sustacal , Ensure , [employer] , [employer] , [employer]  or [employer] .  These may be purchased in grocery stores or pharmacy departments.    Use instant breakfast powder in milk drinks and desserts such as pudding    Mix with ice cream, milk and fruit or flavorings for a high-protein milk shake    Add powdered protein powder, such as ProCal Supplement , Promod , Pro Source  to foods such as soups, custards,puddings or orange juice or lemonade.   Ice Cream, Yogurt, and Frozen Yogurt Add to carbonated beverage such as ginger ale    Add to milk drinks such a milkshakes    Add to cereals, fruits, gelatin desserts and pies    Blend or whip wit soft or cooked fruits    Sandwich ice cream or frozen yogurt between enriched cake slices, cookies or graham crackers    Use seasoned yogurt as a dip for fruits, vegetables or chips    Use yogurt in place of sour cream in casseroles   Eggs, Egg Whites and Egg Yolks Add chopped , hard cooked eggs to salads and dressings, vegetables, casseroles and creamed meats    Beat eggs  into mashed potatoes, vegetable purees and sauces    Add extra whites to quiches, scrambled eggs, custards, puddings and pancake and [place] toast batter,    Make a rich custard with egg yolks, double strength milk and sugar.    Add extra hard-cooked yolks to deviled egg filling and sandwich spreads    Caution:  Do not serve raw or undercooked egg products die to risk of salmonella bacteria causing illness.    Note:  Use pasteurized liquid eggs if the food will not be thoroughly cooked,  Pasteurized liquid eggs are available in the frozen food section of grocery stores   Nuts. Seeds and Wheat Germ Add to casseroles, breads, muffins, pancakes, cookies and waffles.    Sprinkle on fruit, cereal, ice cream, yogurt, vegetables, salads and toast as a crunchy topping.    Use in place of breadcrumbs    Blend with parsley or spinach, herbs  and cream for a noodle, pasta or vegetable sauce    Roll banana in chopped nuts   Peanut buter Spread on sandwiches, toast, muffins, crackers, waffles, pancakes or fruit slices.    Use as a dip for raw vegetables, such as carrots, cauliflower and celery    Blend with milk drinks and beverages    Swirl through soft ice cream and yogurt    Spread on a banana and then roll banana in crushed cereal or chopped nuts.   Meat and Fish Add chopped cooked meat or fish to vegetables, salads,  casseroles, soups, sauces and biscuit dough.    Use in omelets, souffles, quiches and sandwich fillings.    Add chicken and [place] to [employer] in piecrust or biscuit dough as turnovers    Add to stuffed baked potatoes    Add pureed meat to soups    Eat calves or chicken liver or heart, which are especially good sources of protein, vitamins and minerals   Beans Cook and use dried peas, beans and bean curd (tofu) in soups or add to casseroles, pastas and grain dishes that also contain cheese or meat.    Mash with cheese and milk    Use tofu to make smoothies.   Smoking Cessation resources are available by calling 1-800-QUIT-NOW 641 466 8160)    Thank you for choosing [facility] for your medical care.  We hope you were satisfied with the care you received.  Please contact the Patient Advocate at [phone] if you have any questions or concerns related to your care.    If you experience a life threatening emergency, call 911 immediately or proceed to the closet ER.     If someone you know is dealing with an addiction issue and seeking help call the HELP4WV Hotline at 1-844-HELP4WV ((614) 372-9992).      If you need to schedule an appointment such as an ultrasound or an MRI or other advanced imaging, please call [facility] scheduling at [phone].    Please remember to take ALL your belongings with you when you leave your appointment.      To assist us  in providing quality care, you may receive a patient  satisfaction survey in the mail.  Please take a few minutes to complete the survey, as we value your feedback and look for opportunities to serve you better.

## 2023-11-02 ENCOUNTER — Telehealth (HOSPITAL_COMMUNITY): Payer: Self-pay | Admitting: Registered Nurse

## 2023-11-02 NOTE — Telephone Encounter (Signed)
 Home health called and they would like to know how often the wound dressings need changed.     Please advise,     Management consultant  11/02/2023, 15:08

## 2023-11-05 ENCOUNTER — Telehealth (HOSPITAL_COMMUNITY): Payer: Self-pay | Admitting: Registered Nurse

## 2023-11-05 NOTE — Telephone Encounter (Signed)
 Nicole Caulk, FNP  You3 days ago       Would need dressing changes 3 times a week     Nicole Caulk, FNP  You3 days ago       If someone calls about wound patient. They should call wound care.     You routed conversation to [name], FNP3 days ago     You  [name]; General [facility] Clinical Support3 days ago       This should be directed to wound care/wound clinic     [name] routed conversation to [employer] [facility] Clinical Support3 days ago     [name] Hatter days ago       Home health called and they would like to know how often the wound dressings need changed.      Please advise,      Management consultant  11/02/2023, 15:08

## 2023-11-07 ENCOUNTER — Encounter (HOSPITAL_COMMUNITY): Payer: Self-pay | Admitting: Registered Nurse

## 2023-11-07 ENCOUNTER — Other Ambulatory Visit: Payer: Self-pay

## 2023-11-07 ENCOUNTER — Ambulatory Visit: Attending: Registered Nurse | Admitting: Registered Nurse

## 2023-11-07 VITALS — BP 93/68 | HR 77 | Temp 97.0°F | Resp 16

## 2023-11-07 DIAGNOSIS — T8189XD Other complications of procedures, not elsewhere classified, subsequent encounter: Secondary | ICD-10-CM

## 2023-11-07 DIAGNOSIS — T8131XD Disruption of external operation (surgical) wound, not elsewhere classified, subsequent encounter: Secondary | ICD-10-CM | POA: Insufficient documentation

## 2023-11-07 DIAGNOSIS — T8189XS Other complications of procedures, not elsewhere classified, sequela: Secondary | ICD-10-CM

## 2023-11-07 NOTE — Patient Instructions (Addendum)
 Order Questions    Question Answer Comment   Cleanse wound with SOAP AND WATER     Apply to wound  lightly packed calcium  alginate into tunnel at 12 o'clock   Secure dressing with Border Dressing    Change Dressing Every Other Day        Tip for Including Protein in Your Diet    Hard or semi soft cheese (e.g. Cheddar, Marylou Sobers, brick) Melt on sandwiches, bread, muffins, tortillas, hamburgers, hot dogs, other meats or fish, vegetables, eggs and desserts such as stewed fruit or pies.    Grate and add to soups, sauces, casseroles, vegetable dishes, potatoes, rice noodles or meat loaf    Serve as a snack with crackers or bagels.   Cottage Cheese/Ricotta Cheese Mix with or use to stuff fruits and vegetables    Add to casseroles, spaghetti, noodles or egg dishes such as omelets, scrambled eggs and souffles.    Use in gelatin, pudding type desserts, cheesecake and pancake or waffle batter    Use to stuff crepes, pasta shells or manicotti    Puree and use as a substitute for sour cream   Milk Use in beverages and in cooking    Use in preparing foods , such as hot cereal, soups, cocoa or pudding    Add cream sauces to vegetable and other dishes    Use evaporated milk, evaporated skim milk or sweetened condensed milk instead of milk or water  in recipes   Non-fat Dry Milk Add 1/3 cup nonfat dry powered milk to each cup of regular milk for "double strength" milk.    Add to yogurt and milk drinks such as pasteurized eggnog and milkshakes      Add to scrambled eggs and mashed potatoes    Use in casseroles, meat loaf, hot cereal, breads, muffins, sauces, cream soups, puddings and custards and other milk based desserts.    Caution:  Nonfat dry milk may contain bacteria.  Add nonfat dry milk to a small amount of boiling water  before adding to beverages or food that will not be cooked prior to use.  These mixed beverages and food items need to be refrigerated promptly and used within 24 hours of the mixing time.   Commercial Protein  Supplements Use nutrition supplements such as instant breakfast, Sustacal , Ensure , [employer] , [employer] , [employer]  or [employer] .  These may be purchased in grocery stores or pharmacy departments.    Use instant breakfast powder in milk drinks and desserts such as pudding    Mix with ice cream, milk and fruit or flavorings for a high-protein milk shake    Add powdered protein powder, such as ProCal Supplement , Promod , Pro Source  to foods such as soups, custards,puddings or orange juice or lemonade.   Ice Cream, Yogurt, and Frozen Yogurt Add to carbonated beverage such as ginger ale    Add to milk drinks such a milkshakes    Add to cereals, fruits, gelatin desserts and pies    Blend or whip wit soft or cooked fruits    Sandwich ice cream or frozen yogurt between enriched cake slices, cookies or graham crackers    Use seasoned yogurt as a dip for fruits, vegetables or chips    Use yogurt in place of sour cream in casseroles   Eggs, Egg Whites and Egg Yolks Add chopped , hard cooked eggs to salads and dressings, vegetables, casseroles and creamed meats    Beat eggs  into mashed potatoes, vegetable purees and sauces    Add extra whites to quiches, scrambled eggs, custards, puddings and pancake and [place] toast batter,    Make a rich custard with egg yolks, double strength milk and sugar.    Add extra hard-cooked yolks to deviled egg filling and sandwich spreads    Caution:  Do not serve raw or undercooked egg products die to risk of salmonella bacteria causing illness.    Note:  Use pasteurized liquid eggs if the food will not be thoroughly cooked,  Pasteurized liquid eggs are available in the frozen food section of grocery stores   Nuts. Seeds and Wheat Germ Add to casseroles, breads, muffins, pancakes, cookies and waffles.    Sprinkle on fruit, cereal, ice cream, yogurt, vegetables, salads and toast as a crunchy topping.    Use in place of breadcrumbs    Blend with parsley or spinach, herbs  and cream for a noodle, pasta or vegetable sauce    Roll banana in chopped nuts   Peanut buter Spread on sandwiches, toast, muffins, crackers, waffles, pancakes or fruit slices.    Use as a dip for raw vegetables, such as carrots, cauliflower and celery    Blend with milk drinks and beverages    Swirl through soft ice cream and yogurt    Spread on a banana and then roll banana in crushed cereal or chopped nuts.   Meat and Fish Add chopped cooked meat or fish to vegetables, salads,  casseroles, soups, sauces and biscuit dough.    Use in omelets, souffles, quiches and sandwich fillings.    Add chicken and [place] to [employer] in piecrust or biscuit dough as turnovers    Add to stuffed baked potatoes    Add pureed meat to soups    Eat calves or chicken liver or heart, which are especially good sources of protein, vitamins and minerals   Beans Cook and use dried peas, beans and bean curd (tofu) in soups or add to casseroles, pastas and grain dishes that also contain cheese or meat.    Mash with cheese and milk    Use tofu to make smoothies.   Smoking Cessation resources are available by calling 1-800-QUIT-NOW 641 466 8160)    Thank you for choosing [facility] for your medical care.  We hope you were satisfied with the care you received.  Please contact the Patient Advocate at [phone] if you have any questions or concerns related to your care.    If you experience a life threatening emergency, call 911 immediately or proceed to the closet ER.     If someone you know is dealing with an addiction issue and seeking help call the HELP4WV Hotline at 1-844-HELP4WV ((614) 372-9992).      If you need to schedule an appointment such as an ultrasound or an MRI or other advanced imaging, please call [facility] scheduling at [phone].    Please remember to take ALL your belongings with you when you leave your appointment.      To assist us  in providing quality care, you may receive a patient  satisfaction survey in the mail.  Please take a few minutes to complete the survey, as we value your feedback and look for opportunities to serve you better.

## 2023-11-07 NOTE — Nursing Note (Signed)
 11/07/23 0900   Wound  Incision Lower;Mid Back   Initial Date of Wound Assessment/Initial Time of Wound Assessment: 10/17/23 1003   Wound Approximate Age at First Assessment (Weeks): 3 weeks  Primary Wound Type: Incision  Orientation: Lower;Mid  Location: Back   LDAW Image    Dressing Status Removed   WOUND SITE CLOSURE None   Wound Thickness Full Thickness   State of Healing Non-healing   Periwound Pink   Wound Edges Attached to wound base   Wound Bed slough   Wound Bed Additional Information 76%-100% slough   Wound Length (cm) 1.2 cm   Wound Width (cm) 0.5 cm   Wound Depth (cm) 1.1 cm   Wound Volume (cm^3) 0.66 cm^3   Wound Healing % 80   Wound Surface Area (cm^2) 0.6 cm^2   Wound Undermining No   Wound Tunneling  Yes   Tunneling (Depth (cm)/Location) 2.8@12    Wound Drainage Amount Moderate   Exudate/Drainage Yellow   Wound Odor Absent   Arrival Dressing Type Calcium  Alginate with Silver ;Border Foam   Wound Cleansed with saline
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
## 2023-11-08 ENCOUNTER — Ambulatory Visit: Attending: Internal Medicine

## 2023-11-08 ENCOUNTER — Ambulatory Visit: Payer: Self-pay | Attending: Internal Medicine | Admitting: Internal Medicine

## 2023-11-08 ENCOUNTER — Telehealth (INDEPENDENT_AMBULATORY_CARE_PROVIDER_SITE_OTHER): Payer: Self-pay | Admitting: Internal Medicine

## 2023-11-08 ENCOUNTER — Other Ambulatory Visit: Payer: Self-pay

## 2023-11-08 ENCOUNTER — Encounter (HOSPITAL_COMMUNITY): Payer: Self-pay

## 2023-11-08 ENCOUNTER — Encounter (INDEPENDENT_AMBULATORY_CARE_PROVIDER_SITE_OTHER): Payer: Self-pay | Admitting: Internal Medicine

## 2023-11-08 VITALS — BP 102/60 | HR 82 | Temp 99.3°F | Wt 145.0 lb

## 2023-11-08 DIAGNOSIS — Z85118 Personal history of other malignant neoplasm of bronchus and lung: Secondary | ICD-10-CM | POA: Insufficient documentation

## 2023-11-08 DIAGNOSIS — C7989 Secondary malignant neoplasm of other specified sites: Secondary | ICD-10-CM | POA: Insufficient documentation

## 2023-11-08 DIAGNOSIS — R5383 Other fatigue: Secondary | ICD-10-CM | POA: Insufficient documentation

## 2023-11-08 DIAGNOSIS — R509 Fever, unspecified: Secondary | ICD-10-CM | POA: Insufficient documentation

## 2023-11-08 DIAGNOSIS — R7989 Other specified abnormal findings of blood chemistry: Secondary | ICD-10-CM | POA: Insufficient documentation

## 2023-11-08 DIAGNOSIS — K6819 Other retroperitoneal abscess: Secondary | ICD-10-CM | POA: Insufficient documentation

## 2023-11-08 DIAGNOSIS — D472 Monoclonal gammopathy: Secondary | ICD-10-CM | POA: Insufficient documentation

## 2023-11-08 DIAGNOSIS — T8189XS Other complications of procedures, not elsewhere classified, sequela: Secondary | ICD-10-CM | POA: Insufficient documentation

## 2023-11-08 DIAGNOSIS — J029 Acute pharyngitis, unspecified: Secondary | ICD-10-CM | POA: Insufficient documentation

## 2023-11-08 LAB — CBC W/AUTO DIFF
BASOPHIL #: <0.1 10*3/uL (ref <=0.20)
BASOPHIL %: 0.5 %
EOSINOPHIL #: 0.22 10*3/uL (ref <=0.50)
EOSINOPHIL %: 2.6 %
HCT: 35.4 % (ref 34.8–46.0)
HGB: 11.4 g/dL — ABNORMAL LOW (ref 11.5–16.0)
IMMATURE GRANULOCYTE #: <0.1 10*3/uL (ref <0.10)
IMMATURE GRANULOCYTE %: 0.5 % (ref 0.0–1.0)
LYMPHOCYTE #: 1.8 10*3/uL (ref 1.00–4.80)
LYMPHOCYTE %: 21 %
MCH: 31.6 pg (ref 26.0–32.0)
MCHC: 32.2 g/dL (ref 31.0–35.5)
MCV: 98.1 fL (ref 78.0–100.0)
MONOCYTE #: 1.03 10*3/uL (ref 0.20–1.10)
MONOCYTE %: 12 %
MPV: 13 fL — ABNORMAL HIGH (ref 8.7–12.5)
NEUTROPHIL #: 5.46 10*3/uL (ref 1.50–7.70)
NEUTROPHIL %: 63.4 %
PLATELETS: 271 10*3/uL (ref 150–400)
RBC: 3.61 10*6/uL — ABNORMAL LOW (ref 3.85–5.22)
RDW-CV: 13.8 % (ref 11.5–15.5)
WBC: 8.6 10*3/uL (ref 3.7–11.0)

## 2023-11-08 MED ORDER — AMOXICILLIN 875 MG-POTASSIUM CLAVULANATE 125 MG TABLET
1.0000 | ORAL_TABLET | Freq: Two times a day (BID) | ORAL | 0 refills | Status: AC
Start: 2023-11-08 — End: 2023-11-15
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
## 2023-11-08 NOTE — Nursing Note (Signed)
 11/08/23 1452   PHQ 9 (follow up)   Little interest or pleasure in doing things. 0   Feeling down, depressed, or hopeless 0   PHQ 2 Total 0   Trouble falling or staying asleep, or sleeping too much. 0   Feeling tired or having little energy 3   Poor appetite or overeating 0   Feeling bad about yourself/ that you are a failure in the past 2 weeks? 0   Trouble concentrating on things in the past 2 weeks? 0   Moving/Speaking slowly or being fidgety or restless  in the past 2 weeks? 0   Thoughts that you would be better off DEAD, or of hurting yourself in some way. 0   If you checked off any problems, how difficult have these problems made it for you to do your work, take care of things at home, or get along with other people? Not difficult at all   PHQ 9 Total 3   Interpretation of Total Score No depression

## 2023-11-08 NOTE — Nursing Note (Signed)
 BP 102/60 (Site: Left Arm, Patient Position: Sitting, Cuff Size: Adult Large)   Pulse 82   Temp 37.4 C (99.3 F) (Temporal)   Wt 65.8 kg (145 lb)   SpO2 98%   BMI 23.40 kg/m

## 2023-11-08 NOTE — Nursing Note (Signed)
 11/08/23 1500   Rapid Strep   Time Performed 1545   Rapid Strep Negative   Lot# [id number]   Expiration Date 06/04/24   Internal Control Valid yes   Ordering Physician Bagree   Throat Culture Sent No   Nurse initials ds

## 2023-11-08 NOTE — Nursing Note (Signed)
 11/08/23 1452   Medicare Wellness Assessment   Medicare initial or wellness physical in the last year? No   Advance Directives   Does patient have a living will or MPOA No   Advance directive information given to the patient today? Yes   Activities of Daily Living   Do you need help with dressing, bathing, or walking? No   Do you need help with shopping, housekeeping, medications, or finances? No   Do you have rugs in hallways, broken steps, or poor lighting? No   Do you have grab bars in your bathroom, non-slip strips in your tub, and hand rails on your stairs? Yes   Cognitive Function Screen   What is you age? 1   What is the time to the nearest hour? 1   What is the year? 1   What is the name of this clinic? 1   Can the patient recognize two persons (the doctor, the nurse, home help, etc.)? 1   What is the date of your birth? (day and month sufficient)  1   In what year did World War II end? 1   Who is the current president of the [place] ? 1   Count from 20 down to 1? 1   What address did I give you earlier? 1   Total Score 10   Interpretation of Total Score Greater than 6 Normal   Depression Screen   Little interest or pleasure in doing things. 0   Feeling down, depressed, or hopeless 0   PHQ 2 Total 0   Trouble falling or staying asleep, or sleeping too much. 0   Feeling tired or having little energy 3   Poor appetite or overeating 0   Feeling bad about yourself/ that you are a failure in the past 2 weeks? 0   Trouble concentrating on things in the past 2 weeks? 0   Moving/Speaking slowly or being fidgety or restless  in the past 2 weeks? 0   Thoughts that you would be better off DEAD, or of hurting yourself in some way. 0   If you checked off any problems, how difficult have these problems made it for you to do your work, take care of things at home, or get along with other people? Not difficult at all   PHQ 9 Total 3   Interpretation of Total Score No depression   Pain Score   Pain Score EIGHT    Substance Use Screening   In Past 12 MONTHS, how often have you used any tobacco product (for example, cigarettes, e-cigarettes, cigars, pipes, or smokeless tobacco)? Never   In the PAST 12 MONTHS, how often have you had 5 (men)/4 (women) or more drinks containing alcohol in one day? Never   In the PAST 12 months, how often have you used any prescription medications just for the feeling, more than prescribed, or that were not prescribed for you? Prescriptions may include: opioids, benzodiazepines, medications for ADHD Never   In the PAST 12 MONTHS, how often have you used any drugs, including marijuana, cocaine or crack, heroin, methamphetamine, hallucinogens, ecstasy/MDMA? Never   Fall Risk Assessment   Do you feel unsteady when standing or walking? Yes   Do you worry about falling? No   Have you fallen in the past year? Yes   How many times have you fallen? Once   Were you ever injured from falling? No   Urinary Incontinence Screen   Do you ever leak urine when  you don't want to? No   OTHER   Reported to Encounter Provider Yes

## 2023-11-08 NOTE — Telephone Encounter (Signed)
 Patient declined Stat CT for now until she speaks with her daughter. If daughter tells her to schedule, we provided the number to scheduling so patient can schedule.     FYI.       Thanks,   Dan Dun

## 2023-11-09 ENCOUNTER — Other Ambulatory Visit: Payer: Self-pay

## 2023-11-09 ENCOUNTER — Encounter (HOSPITAL_COMMUNITY): Payer: Self-pay

## 2023-11-09 ENCOUNTER — Ambulatory Visit (INDEPENDENT_AMBULATORY_CARE_PROVIDER_SITE_OTHER): Payer: Self-pay | Admitting: Internal Medicine

## 2023-11-09 ENCOUNTER — Emergency Department (HOSPITAL_COMMUNITY)

## 2023-11-09 ENCOUNTER — Emergency Department
Admission: EM | Admit: 2023-11-09 | Discharge: 2023-11-09 | Disposition: A | Discharge status: Home / Self Care | Attending: EMERGENCY MEDICINE | Admitting: EMERGENCY MEDICINE

## 2023-11-09 ENCOUNTER — Ambulatory Visit (HOSPITAL_COMMUNITY)

## 2023-11-09 DIAGNOSIS — C7989 Secondary malignant neoplasm of other specified sites: Secondary | ICD-10-CM | POA: Insufficient documentation

## 2023-11-09 DIAGNOSIS — R509 Fever, unspecified: Secondary | ICD-10-CM | POA: Insufficient documentation

## 2023-11-09 DIAGNOSIS — K6819 Other retroperitoneal abscess: Secondary | ICD-10-CM | POA: Insufficient documentation

## 2023-11-09 DIAGNOSIS — M549 Dorsalgia, unspecified: Secondary | ICD-10-CM

## 2023-11-09 DIAGNOSIS — T8189XS Other complications of procedures, not elsewhere classified, sequela: Secondary | ICD-10-CM | POA: Insufficient documentation

## 2023-11-09 DIAGNOSIS — R7989 Other specified abnormal findings of blood chemistry: Secondary | ICD-10-CM | POA: Insufficient documentation

## 2023-11-09 DIAGNOSIS — D472 Monoclonal gammopathy: Secondary | ICD-10-CM | POA: Insufficient documentation

## 2023-11-09 DIAGNOSIS — G062 Extradural and subdural abscess, unspecified: Secondary | ICD-10-CM | POA: Insufficient documentation

## 2023-11-09 DIAGNOSIS — Z85118 Personal history of other malignant neoplasm of bronchus and lung: Secondary | ICD-10-CM | POA: Insufficient documentation

## 2023-11-09 DIAGNOSIS — R5383 Other fatigue: Secondary | ICD-10-CM | POA: Insufficient documentation

## 2023-11-09 DIAGNOSIS — Z981 Arthrodesis status: Secondary | ICD-10-CM | POA: Insufficient documentation

## 2023-11-09 LAB — COMPREHENSIVE METABOLIC PANEL, NON-FASTING
ALBUMIN: 3.7 g/dL (ref 3.4–4.8)
ALBUMIN: 3.5 g/dL (ref 3.4–4.8)
ALKALINE PHOSPHATASE: 84 U/L (ref 55–145)
ALKALINE PHOSPHATASE: 80 U/L (ref 55–145)
ALT (SGPT): 17 U/L (ref <31)
ALT (SGPT): 14 U/L (ref <31)
ANION GAP: 7 mmol/L (ref 4–13)
ANION GAP: 6 mmol/L (ref 4–13)
AST (SGOT): 25 U/L (ref 11–34)
AST (SGOT): 28 U/L (ref 11–34)
BILIRUBIN TOTAL: 0.5 mg/dL (ref 0.3–1.3)
BILIRUBIN TOTAL: 0.5 mg/dL (ref 0.3–1.3)
BUN/CREA RATIO: 28 — ABNORMAL HIGH (ref 6–22)
BUN/CREA RATIO: 30 — ABNORMAL HIGH (ref 6–22)
BUN: 21 mg/dL (ref 8–25)
BUN: 21 mg/dL (ref 8–25)
CALCIUM: 9.5 mg/dL (ref 8.6–10.3)
CALCIUM: 9.5 mg/dL (ref 8.6–10.3)
CHLORIDE: 105 mmol/L (ref 96–111)
CHLORIDE: 105 mmol/L (ref 96–111)
CO2 TOTAL: 24 mmol/L (ref 23–31)
CO2 TOTAL: 27 mmol/L (ref 23–31)
CREATININE: 0.75 mg/dL (ref 0.60–1.05)
CREATININE: 0.69 mg/dL (ref 0.60–1.05)
ESTIMATED GFR - FEMALE: 83 mL/min/BSA (ref >=60)
ESTIMATED GFR - FEMALE: >90 mL/min/BSA (ref >=60)
GLUCOSE: 99 mg/dL (ref 65–125)
GLUCOSE: 100 mg/dL (ref 65–125)
POTASSIUM: 4.4 mmol/L (ref 3.5–5.1)
POTASSIUM: 4.2 mmol/L (ref 3.5–5.1)
PROTEIN TOTAL: 7.5 g/dL (ref 5.6–7.6)
PROTEIN TOTAL: 7.8 g/dL (ref 6.0–8.0)
SODIUM: 136 mmol/L (ref 136–145)
SODIUM: 138 mmol/L (ref 136–145)

## 2023-11-09 LAB — URINALYSIS, MACROSCOPIC
BILIRUBIN: NEGATIVE mg/dL
BLOOD: NEGATIVE mg/dL
GLUCOSE: NORMAL mg/dL
KETONES: NEGATIVE mg/dL
LEUKOCYTES: NEGATIVE WBCs/uL
NITRITE: NEGATIVE
PH: 6.5 (ref 5.0–7.5)
PROTEIN: 15 mg/dL — AB
SPECIFIC GRAVITY: 1.01 (ref 1.005–1.020)
UROBILINOGEN: <1 mg/dL (ref <=2.0)

## 2023-11-09 LAB — CBC WITH DIFF
BASOPHIL #: <0.1 10*3/uL (ref <=0.20)
BASOPHIL %: 0.6 %
EOSINOPHIL #: <0.1 10*3/uL (ref <=0.50)
EOSINOPHIL %: 0.8 %
HCT: 32.4 % — ABNORMAL LOW (ref 34.8–46.0)
HGB: 10.7 g/dL — ABNORMAL LOW (ref 11.5–16.0)
IMMATURE GRANULOCYTE #: <0.1 10*3/uL (ref <0.10)
IMMATURE GRANULOCYTE %: 0.3 % (ref 0.0–1.0)
LYMPHOCYTE #: 1.74 10*3/uL (ref 1.00–4.80)
LYMPHOCYTE %: 24.5 %
MCH: 31.6 pg (ref 26.0–32.0)
MCHC: 33 g/dL (ref 31.0–35.5)
MCV: 95.6 fL (ref 78.0–100.0)
MONOCYTE #: 0.89 10*3/uL (ref 0.20–1.10)
MONOCYTE %: 12.5 %
MPV: 10.7 fL (ref 8.7–12.5)
NEUTROPHIL #: 4.35 10*3/uL (ref 1.50–7.70)
NEUTROPHIL %: 61.3 %
PLATELETS: 369 10*3/uL (ref 150–400)
RBC: 3.39 10*6/uL — ABNORMAL LOW (ref 3.85–5.22)
RDW-CV: 13.7 % (ref 11.5–15.5)
WBC: 7.1 10*3/uL (ref 3.7–11.0)

## 2023-11-09 LAB — LACTIC ACID LEVEL W/ REFLEX FOR LEVEL >2.0: LACTIC ACID: 0.7 mmol/L (ref 0.5–2.2)

## 2023-11-09 LAB — ALBUMIN FOR ELECTROPHORESIS: ALBUMIN: 3.7 g/dL (ref 3.4–4.8)

## 2023-11-09 LAB — PROTEIN FOR ELECTROPHORESIS: PROTEIN TOTAL: 7.5 g/dL (ref 5.6–7.6)

## 2023-11-09 LAB — THYROID STIMULATING HORMONE WITH FREE T4 REFLEX: TSH: 1.432 u[IU]/mL (ref 0.350–4.940)

## 2023-11-09 MED ORDER — IOPAMIDOL 300 MG IODINE/ML (61 %) INTRAVENOUS SOLUTION
100.0000 mL | INTRAVENOUS | Status: AC
Start: 2023-11-09 — End: 2023-11-09
  Administered 2023-11-09: 93 mL via INTRAVENOUS
  Filled 2023-11-09: qty 100

## 2023-11-09 MED ORDER — FENTANYL (PF) 50 MCG/ML INJECTION SOLUTION
50.0000 ug | INTRAMUSCULAR | Status: AC
Start: 2023-11-09 — End: 2023-11-09
  Administered 2023-11-09: 50 ug via INTRAVENOUS
  Filled 2023-11-09: qty 2

## 2023-11-09 MED ORDER — DEXTROSE 5% IN WATER (D5W) FLUSH BAG - 250 ML
INTRAVENOUS | Status: DC | PRN
Start: 2023-11-09 — End: 2023-11-09

## 2023-11-09 MED ORDER — SODIUM CHLORIDE 0.9 % (FLUSH) INJECTION SYRINGE
2.5000 mL | INJECTION | Freq: Three times a day (TID) | INTRAMUSCULAR | Status: DC
Start: 2023-11-09 — End: 2023-11-09
  Administered 2023-11-09: 0 mL via INTRAVENOUS

## 2023-11-09 MED ORDER — SODIUM CHLORIDE 0.9% FLUSH BAG - 250 ML
INTRAVENOUS | Status: DC | PRN
Start: 2023-11-09 — End: 2023-11-09

## 2023-11-09 MED ORDER — SODIUM CHLORIDE 0.9 % (FLUSH) INJECTION SYRINGE
10.0000 mL | INJECTION | INTRAMUSCULAR | Status: DC | PRN
Start: 2023-11-09 — End: 2023-11-09

## 2023-11-09 MED ORDER — LORAZEPAM 1 MG TABLET
1.0000 mg | ORAL_TABLET | ORAL | Status: AC
Start: 2023-11-09 — End: 2023-11-09
  Administered 2023-11-09: 1 mg via ORAL
  Filled 2023-11-09: qty 1

## 2023-11-09 MED ORDER — FENTANYL (PF) 50 MCG/ML INJECTION SOLUTION
25.0000 ug | INTRAMUSCULAR | Status: AC
Start: 2023-11-09 — End: 2023-11-09
  Administered 2023-11-09: 25 ug via INTRAVENOUS
  Filled 2023-11-09: qty 2

## 2023-11-09 MED ORDER — HYDROMORPHONE 1 MG/ML INJECTION WRAPPER
1.0000 mg | INJECTION | INTRAMUSCULAR | Status: DC
Start: 2023-11-09 — End: 2023-11-09
  Filled 2023-11-09: qty 1

## 2023-11-09 NOTE — ED Nurses Note (Signed)
 Patient refused to lay back in bed, requested to sit at side. Pt advised it would be best to lay back as it is the safest position. Family at bedside, standing with patient while she sits.

## 2023-11-09 NOTE — ED Nurses Note (Signed)
 Assisted patient to bedside commode

## 2023-11-09 NOTE — Discharge Instructions (Signed)
 We made a shared medical decision regarding going promptly to [facility] for the original surgery was.  If you are unable to make it there promptly return back to our facility.
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
## 2023-11-09 NOTE — ED Nurses Note (Signed)
 Dr. Blase Mess at bedside.
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
## 2023-11-09 NOTE — ED Nurses Note (Signed)
 Patient discharged home with family.  AVS reviewed with patient/care giver.  A written copy of the AVS and discharge instructions was given to the patient/care giver.  Questions sufficiently answered as needed.  Patient/care giver encouraged to follow up with PCP as indicated.  In the event of an emergency, patient/care giver instructed to call 911 or go to the nearest emergency room.      All questions answered. Patient taken to son's car via WC by Doylene Genet, RN. NAD at time of d/c.

## 2023-11-09 NOTE — ED Nurses Note (Signed)
 Pt asked when she can go home, sitting in chair next to family. Informed patient we are waiting for results. Pt asked for pain medication. Dr. Linder Revere made aware.

## 2023-11-09 NOTE — ED Nurses Note (Signed)
 Report given to RN Sheria Lang

## 2023-11-09 NOTE — ED Nurses Note (Signed)
 Son at bedside. Dr. Linder Revere spoke with patient and patient in agreement to go to ER at [facility]. Awaiting imaging disc from CT prior to patient d/c.

## 2023-11-09 NOTE — ED Nurses Note (Addendum)
 Son in agreement to take patient to [facility] ER after d/c from [facility] ED. Patient and son both agreeable with this plan. All questions answered.

## 2023-11-09 NOTE — ED Nurses Note (Signed)
 Report from [place], [place].

## 2023-11-09 NOTE — ED Nurses Note (Signed)
 Pt states she wanted this RN to stop putting in IV. Pt requested PO lorazepam  prior to IV insertion.

## 2023-11-10 ENCOUNTER — Encounter (HOSPITAL_COMMUNITY): Payer: Self-pay | Admitting: Internal Medicine

## 2023-11-10 NOTE — Nursing Note (Signed)
 Post ED follow up call deferred because patient is admitted at [facility]. MyChart message sent.  Wyvonne Height, RN

## 2023-11-11 NOTE — Progress Notes (Signed)
 [name]        SUBJECTIVE/INTERVAL EVENTS:    Doing well this morning    OBJECTIVE:  Vitals: BP: 102/59, HR: 73, RR: 16, T(C): 36.5, %Sat: 100 on None-room air L/min    Exam:    General:  Resting comfortably  Neuro: AO    Spine:     Bilateral Lower Extremities:      Muscle strength:     Right Left   L2/Hip flexion (Iliopsoas, Femoral n) 5/5 5/5   L3-4/Knee flexion (Hamstrings, Sciatic n) 5/5 5/5   L3/Knee extension (Quads, Femoral n) 5/5 5/5   L4/ Foot dorsiflexion (TA, Peroneal n) 5/5 5/5   L5/ Toe extension (EHL, Peroneal n.) 5/5 5/5   S1/ Foot plantarflexion (GS, tibial n) 5/5 5/5   S2/Toe plantarflexion (FDL/TP, tibial n) 5/5 5/5      Sensation to light touch is intact in L1-S1 dermatomes.   Extremity warm and well perfused and Cap refill < 3 sec        135 103 19 / 78    Hemolyzed 20* 0.67 \                                     06/07 1631    6.00 \ 9.8* / 319    / 31.2* \                               06/07 1631    ASSESSMENT/PLAN:  [name] is a 73 y.o. female presenting with low back pain, in the setting of a recent posterior spinal fusion L4-L5 with Dr. Arnell Lange.  She has no radiculopathic or red flag symptoms at this time.     11E under ortho spine  Mri L with and without contrast completed  Fu Blood Cx and infectious markers  Will cont home meds as appropriate  Apperciate medicine recs  Will need wound care cs tomorrow  Will do keflex  for one week   Ok for diet

## 2023-11-11 NOTE — Telephone Encounter (Signed)
 Patient read result note via MyChart message, no reply at this time:   Last read by Jude Norton at 9:15PM on 11/09/2023.    Axel Lent Gracy Law, BSN, RN  Clinical Nurse Coordinator

## 2023-11-12 LAB — KAPPA AND LAMBDA FREE LIGHT CHAINS, SERUM
KAPPA FREE LIGHT CHAINS: 5.71 mg/dL — ABNORMAL HIGH (ref 1.25–3.25)
KAPPA/LAMBDA FLC RATIO: 2.08 (ref 0.80–2.10)
LAMBDA FREE LIGHT CHAINS: 2.75 mg/dL — ABNORMAL HIGH (ref 0.60–2.70)

## 2023-11-12 NOTE — Progress Notes (Signed)
 Wound Care Consult    Reason for consult:     Chronic lumbar spinal wound.      H+P    [name] is a 73 y.o. female presenting with low back pain, in the setting of a recent posterior spinal fusion L4-L5 with Dr. Arnell Lange.  She has no radiculopathic or red flag symptoms at this time.      Patient is alert and oriented. Patient states she follows with her local wound care center once weekly and has HHRN for wound care 2x/week. Currently they are using silver  alginate and bordered foam. Patient feels the wound has been very stagnant.     Assessment    Visible wound base is moist pink agranular with yellow fibrin visible. There is a tunnel at 12 o'clock probing 4cm in depth. Periwound is clean and intact without erythema, no malodor noted to wound.      11/12/23 1152   Wound Other (Comment) Vertebrae Lower   Date First Assessed/Time First Assessed: 11/10/23 1600   Present on hospital admission/encounter: Yes  Wound Type: (c) Other (Comment)  Location: Vertebrae  Location Orientation: Lower   Dressing Status Open to air   Wound Image    Date of last dressing change 11/12/23   Wound Bed Assessment Tan;Pink (specify %)   Peri-Wound Assessment (Surrounding skin) Clean, dry, intact   Drainage Amount Small   Drainage Description Tan   Length (cm) 0.5 cm   Width (cm) 0.3 cm   Depth (cm) 1 cm   Tunneling (cm/o'clock) 4cm/12 o'clock   Cleansing / Gentle Irrigation Wound cleanser (Vashe)   Dressing Applied Alginate/hydrofiber (e.g. Aquacel);Foam, bordered (e.g. Optifoam, mepilex border)   Frequency of Dressing Changes Every other day   Urinary Device (Adult) Midline External catheter 1   Placement Date/Time: 11/11/23 1720   Present on hospital admission/encounter: Yes  Location: Midline  Type: External catheter  Urinary Device Number: 1   Output (mL) 0 ml   Urinary Device Net Output (mL) 0 mL       Recommendations    -Cleanse wound with Vashe moistened gauze applied to wound base, allow to dwell for 5 minutes then remove.  Apply Cavilon barrier film to periwound. Moisten the tip of Hydrofera Blue packing strip with 2-3 drops of NSS and insert up into the 12 o'clock tunnel. Once packing strip is in place, moisten the outer end with another 2-3 drops NSS. The dressing should become soft and spongy with moisture. Cover with Mepilex sacral foam and change every 2-3 days and prn strikethrough drainage. The dressing may turn white over time, this is an expected function of the dressing.     -Continue to follow with outpatient wound care center.       Follow-up: Will continue to follow.

## 2023-11-13 LAB — MONOCLONAL GAMMOPATHY PROFILE WITH IMMUNOTYPING REFLEX
ALBUMIN: 3.7 g/dL (ref 3.4–4.8)
KAPPA FREE LIGHT CHAINS: 5.71 mg/dL — ABNORMAL HIGH (ref 1.25–3.25)
KAPPA/LAMBDA FLC RATIO: 2.08 (ref 0.80–2.10)
LAMBDA FREE LIGHT CHAINS: 2.75 mg/dL — ABNORMAL HIGH (ref 0.60–2.70)
TOTAL PROTEIN: 7.5 g/dL

## 2023-11-13 LAB — IMMUNOSUBTRACTION, SERUM: PATHOLOGIST INTERPRETATION (IMMUNOTYPING): NORMAL

## 2023-11-13 NOTE — Progress Notes (Signed)
 [name]        SUBJECTIVE/INTERVAL EVENTS:    doing very well, seen by medicine, wound care provided recs    OBJECTIVE:  Vitals: BP: 115/53, HR: 76, RR: 16, T(C): 36.2, %Sat: 98 on None-room air L/min    Exam:    General:  Resting comfortably  Neuro: AO    Spine:     Bilateral Lower Extremities:      Muscle strength:     Right Left   L2/Hip flexion (Iliopsoas, Femoral n) 5/5 5/5   L3-4/Knee flexion (Hamstrings, Sciatic n) 5/5 5/5   L3/Knee extension (Quads, Femoral n) 5/5 5/5   L4/ Foot dorsiflexion (TA, Peroneal n) 5/5 5/5   L5/ Toe extension (EHL, Peroneal n.) 5/5 5/5   S1/ Foot plantarflexion (GS, tibial n) 5/5 5/5   S2/Toe plantarflexion (FDL/TP, tibial n) 5/5 5/5      Sensation to light touch is intact in L1-S1 dermatomes.   Extremity warm and well perfused and Cap refill < 3 sec        135 101 25* / 91    4.9 21 0.69 \                                     06/09 1714    5.83 \ 10.8* / 346    / 34.7* \                               06/09 1714    ASSESSMENT/PLAN:  [name] is a 73 y.o. female presenting with low back pain, in the setting of a recent posterior spinal fusion L4-L5 with Dr. Arnell Lange.  She has no radiculopathic or red flag symptoms at this time. Her MRI was reassuring, she is receiving wound care.    Dc today

## 2023-11-13 NOTE — Progress Notes (Signed)
 [facility] followed up with home care agency and informed her she was staffed with [facility] health SOC date of 06/11-05/2024.

## 2023-11-13 NOTE — Case Communication (Signed)
 PATIENT VERBALIZES CONSENT TO MOVE THE VISIT FROM (DATE) 11/14/23 TO (DATE) 6/13/25REASON FOR MOVED VISIT: has an appointment with wound care tomorrow and does not want the [facility] visit on same day, reports she needs visits on m/fBRIEF DESCRIPTION OF DISCUSSION WITH PATIENT: see aboveIS PATIENT HAVING ANY PROBLEMS RELATED TO HEALTH TODAY? No  IF YES EXPLAIN?

## 2023-11-13 NOTE — Progress Notes (Signed)
 Sent order to [facility] for wound care supplies to be delivered to patients service address.  [facility] [phone]

## 2023-11-14 ENCOUNTER — Other Ambulatory Visit: Payer: Self-pay

## 2023-11-14 ENCOUNTER — Telehealth (INDEPENDENT_AMBULATORY_CARE_PROVIDER_SITE_OTHER): Payer: Self-pay

## 2023-11-14 ENCOUNTER — Ambulatory Visit: Attending: Registered Nurse | Admitting: Registered Nurse

## 2023-11-14 VITALS — BP 99/60 | HR 92 | Temp 97.5°F | Resp 18

## 2023-11-14 DIAGNOSIS — T8189XS Other complications of procedures, not elsewhere classified, sequela: Secondary | ICD-10-CM

## 2023-11-14 DIAGNOSIS — T8189XD Other complications of procedures, not elsewhere classified, subsequent encounter: Secondary | ICD-10-CM | POA: Insufficient documentation

## 2023-11-14 LAB — ADULT ROUTINE BLOOD CULTURE, SET OF 2 BOTTLES (BACTERIA AND YEAST)
BLOOD CULTURE, ROUTINE: NO GROWTH
BLOOD CULTURE, ROUTINE: NO GROWTH

## 2023-11-14 NOTE — Patient Instructions (Addendum)
 Question Answer Comment   Cleanse wound with SOAP AND WATER     Apply to wound  hydrofera blue rope   Secure dressing with Border Dressing    Change Dressing Every Other Day      Home Health to see next week. Next appt in wound care 11/28/23      Tip for Including Protein in Your Diet    Hard or semi soft cheese (e.g. Cheddar, Marylou Sobers, brick) Melt on sandwiches, bread, muffins, tortillas, hamburgers, hot dogs, other meats or fish, vegetables, eggs and desserts such as stewed fruit or pies.    Grate and add to soups, sauces, casseroles, vegetable dishes, potatoes, rice noodles or meat loaf    Serve as a snack with crackers or bagels.   Cottage Cheese/Ricotta Cheese Mix with or use to stuff fruits and vegetables    Add to casseroles, spaghetti, noodles or egg dishes such as omelets, scrambled eggs and souffles.    Use in gelatin, pudding type desserts, cheesecake and pancake or waffle batter    Use to stuff crepes, pasta shells or manicotti    Puree and use as a substitute for sour cream   Milk Use in beverages and in cooking    Use in preparing foods , such as hot cereal, soups, cocoa or pudding    Add cream sauces to vegetable and other dishes    Use evaporated milk, evaporated skim milk or sweetened condensed milk instead of milk or water  in recipes   Non-fat Dry Milk Add 1/3 cup nonfat dry powered milk to each cup of regular milk for double strength milk.    Add to yogurt and milk drinks such as pasteurized eggnog and milkshakes      Add to scrambled eggs and mashed potatoes    Use in casseroles, meat loaf, hot cereal, breads, muffins, sauces, cream soups, puddings and custards and other milk based desserts.    Caution:  Nonfat dry milk may contain bacteria.  Add nonfat dry milk to a small amount of boiling water  before adding to beverages or food that will not be cooked prior to use.  These mixed beverages and food items need to be refrigerated promptly and used within 24 hours of the mixing time.   Commercial  Protein Supplements Use nutrition supplements such as instant breakfast, Sustacal , Ensure , [employer] , [employer] , [employer]  or [employer] .  These may be purchased in grocery stores or pharmacy departments.    Use instant breakfast powder in milk drinks and desserts such as pudding    Mix with ice cream, milk and fruit or flavorings for a high-protein milk shake    Add powdered protein powder, such as ProCal Supplement , Promod , Pro Source  to foods such as soups, custards,puddings or orange juice or lemonade.   Ice Cream, Yogurt, and Frozen Yogurt Add to carbonated beverage such as ginger ale    Add to milk drinks such a milkshakes    Add to cereals, fruits, gelatin desserts and pies    Blend or whip wit soft or cooked fruits    Sandwich ice cream or frozen yogurt between enriched cake slices, cookies or graham crackers    Use seasoned yogurt as a dip for fruits, vegetables or chips    Use yogurt in place of sour cream in casseroles   Eggs, Egg Whites and Egg Yolks Add chopped , hard cooked eggs to salads and dressings, vegetables, casseroles and creamed meats  Beat eggs into mashed potatoes, vegetable purees and sauces    Add extra whites to quiches, scrambled eggs, custards, puddings and pancake and [place] toast batter,    Make a rich custard with egg yolks, double strength milk and sugar.    Add extra hard-cooked yolks to deviled egg filling and sandwich spreads    Caution:  Do not serve raw or undercooked egg products die to risk of salmonella bacteria causing illness.    Note:  Use pasteurized liquid eggs if the food will not be thoroughly cooked,  Pasteurized liquid eggs are available in the frozen food section of grocery stores   Nuts. Seeds and Wheat Germ Add to casseroles, breads, muffins, pancakes, cookies and waffles.    Sprinkle on fruit, cereal, ice cream, yogurt, vegetables, salads and toast as a crunchy topping.    Use in place of breadcrumbs    Blend with parsley or spinach,  herbs and cream for a noodle, pasta or vegetable sauce    Roll banana in chopped nuts   Peanut buter Spread on sandwiches, toast, muffins, crackers, waffles, pancakes or fruit slices.    Use as a dip for raw vegetables, such as carrots, cauliflower and celery    Blend with milk drinks and beverages    Swirl through soft ice cream and yogurt    Spread on a banana and then roll banana in crushed cereal or chopped nuts.   Meat and Fish Add chopped cooked meat or fish to vegetables, salads,  casseroles, soups, sauces and biscuit dough.    Use in omelets, souffles, quiches and sandwich fillings.    Add chicken and [place] to [employer] in piecrust or biscuit dough as turnovers    Add to stuffed baked potatoes    Add pureed meat to soups    Eat calves or chicken liver or heart, which are especially good sources of protein, vitamins and minerals   Beans Cook and use dried peas, beans and bean curd (tofu) in soups or add to casseroles, pastas and grain dishes that also contain cheese or meat.    Mash with cheese and milk    Use tofu to make smoothies.   Smoking Cessation resources are available by calling 1-800-QUIT-NOW 408-798-3597)    Thank you for choosing [facility] for your medical care.  We hope you were satisfied with the care you received.  Please contact the Patient Advocate at [phone] if you have any questions or concerns related to your care.    If you experience a life threatening emergency, call 911 immediately or proceed to the closet ER.     If someone you know is dealing with an addiction issue and seeking help call the HELP4WV Hotline at 1-844-HELP4WV (747-453-7709).      If you need to schedule an appointment such as an ultrasound or an MRI or other advanced imaging, please call [facility] scheduling at [phone].    Please remember to take ALL your belongings with you when you leave your appointment.      To assist us  in providing quality care, you may receive a patient  satisfaction survey in the mail.  Please take a few minutes to complete the survey, as we value your feedback and look for opportunities to serve you better.

## 2023-11-14 NOTE — Telephone Encounter (Signed)
 Transition of Care Contact Information  Discharge Date: 11/13/2023  Transition Facility Type--Hospital (Inpatient or Observation)  Facility Name--[facility]  Interactive Contact(s): Completed or attempted contact indicated by Date/Time  Completed Contact: 11/14/2023 11:34 AM  Contact Method(s)-- Patient/Caregiver Telephone  Clinical Staff Name/Role who contacted--K. Tavoris Brisk RN  Transition Assessment  Discharge Summary obtained?--Yes  How are you recovering?--Improving  Discharge Meds obtained?--Yes  Discharge medication changes reviewed?--Yes  Full Medication Reconciliation Completed?--No  Medication understanding --knows new medication(s)--knows purpose of medication  Medication Concerns?--No  Have everything needed for recovery?--Yes  Care Coordination:   Patient has transition follow-up appointment date and time?--Yes  Follow up appointment date:--11/27/2023  Specialist Transition Visit planned?--Yes  Specialist Transition Visit date:--11/28/2023  Patient/caregiver plans to attend transition visit?--Yes  Primary Follow-up Barrier  Interventions provided --reinforced discharge instructions--follow-up appointment date/time reinforced--patient expresses understanding of follow-up plan  Home Health or DME ordered at discharge?--No  Clinician/Team notified?--No  Primary reason clinician notified?  Transition Note: Spoke to [name] regarding her recent discharge from [facility]. She voices no questions or concerns regarding instructions provided upon discharge. She reports that she is doing okay, she had an appointment with wound care this morning. She was able to obtain prescriptions from hher pharmacy. She reports that she has a follow up appointment with her PCP, Dr. Terrie Fetter, scheduled on 6/24 that was scheduled prior to her hospitalization, encouraged to keep this appointment. She also reports keeping follow up appointments with wound care. Education provided on when to seek emergency medical care if needed, [name] verbalized  understanding.    [name] J Nicole Westerhold, RN
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
## 2023-11-14 NOTE — Progress Notes (Signed)
 Wound Care Note      Patient was seen for a brief follow-up today prior to discharge to provide patient with Hydrofera Blue tunnel foam dressing and education.     Patient observed how to prepare the dressing and asked appropriate questions. She was given supplies, including Vashe and silicone bordered foams, for take home until she is able to be seen again by her wound care center.     The lumbar dressing was changed today and replaced with Hydrofera Blue tunnel foam and sacral bordered foam. Dressing can remain in place 2-3 days.

## 2023-11-14 NOTE — Nursing Note (Signed)
 11/14/23 0900   Wound  Incision Lower;Mid Back   Initial Date of Wound Assessment/Initial Time of Wound Assessment: 10/17/23 1003   Wound Approximate Age at First Assessment (Weeks): 3 weeks  Primary Wound Type: Incision  Orientation: Lower;Mid  Location: Back   LDAW Image    Dressing Status Removed   WOUND SITE CLOSURE None   Wound Thickness Full Thickness   State of Healing Non-healing   Periwound Pink   Wound Edges Attached to wound base   Wound Bed slough   Wound Bed Additional Information 76%-100% slough   Wound Length (cm) 1 cm   Wound Width (cm) 0.6 cm   Wound Depth (cm) 1 cm   Wound Volume (cm^3) 0.314 cm^3   Wound Healing % 91   Wound Surface Area (cm^2) 0.47 cm^2   Wound Undermining No   Wound Tunneling  Yes   Tunneling (Depth (cm)/Location) 2.6cm@12    Wound Drainage Amount Moderate   Exudate/Drainage Yellow   Wound Odor Absent   Arrival Dressing Type Hydrofera Blue Classic;Border Foam  (rope,)

## 2023-11-21 NOTE — Home Health (Signed)
 Skilled nursing needs at this visit: SN assessment, education, Medication review, lung auscultation, wound care    Reason for seeing patient: S/P L4-L5 TLIF (08/24/2023)- non-healing wound. Pain management.     Reason to continue skilled nursing care: SEE ABOVE    Progression of goals to date: Arrived at home as scheduled last night. Greeted at door by patient. Led to living room are where visit began. Patient reports that she continues to have pain and the worst pain is at night- Patient reports that she did not sleep well last night due to the pain that she was having. Patient was educated on nerve pain and advised patient that she could try ice packs to help with the discomfort. Patient educated on lying down multiple times throughout the day to help alleviate some of the pressure on her back. Wound care provided- see addendum. Patient inquiring about PT/OT services to assist with strengthening. No orders as of today- patient has an appointment tomorrow with MD and plans to ask MD for orders. Advised patient on how to either get a printed copy or have them fax orders to our office. Lungs auscultated- clear bilaterally. Medications reviewed- no changes. Educated patient on a high protein diet and gave some coupons for Juven (protein powder).  Patient advised to contact office if issues arise.    Plan for next visit:  Wound care to back  Montior pain- is pain better?  Did patient get order or PT/OT services?

## 2023-11-27 ENCOUNTER — Ambulatory Visit
Admission: RE | Admit: 2023-11-27 | Discharge: 2023-11-27 | Disposition: A | Discharge status: Home / Self Care | Source: Ambulatory Visit | Attending: Internal Medicine | Admitting: Internal Medicine

## 2023-11-27 ENCOUNTER — Encounter (INDEPENDENT_AMBULATORY_CARE_PROVIDER_SITE_OTHER): Payer: Self-pay | Admitting: Internal Medicine

## 2023-11-27 ENCOUNTER — Ambulatory Visit (INDEPENDENT_AMBULATORY_CARE_PROVIDER_SITE_OTHER): Payer: Self-pay | Admitting: Internal Medicine

## 2023-11-27 ENCOUNTER — Other Ambulatory Visit: Payer: Self-pay

## 2023-11-27 ENCOUNTER — Ambulatory Visit: Payer: Self-pay | Attending: Internal Medicine | Admitting: Internal Medicine

## 2023-11-27 VITALS — BP 110/60 | HR 81 | Temp 97.8°F | Wt 147.0 lb

## 2023-11-27 DIAGNOSIS — R519 Headache, unspecified: Secondary | ICD-10-CM | POA: Insufficient documentation

## 2023-11-27 DIAGNOSIS — W19XXXD Unspecified fall, subsequent encounter: Secondary | ICD-10-CM | POA: Insufficient documentation

## 2023-11-27 DIAGNOSIS — T8189XS Other complications of procedures, not elsewhere classified, sequela: Secondary | ICD-10-CM | POA: Insufficient documentation

## 2023-11-27 DIAGNOSIS — G9763 Postprocedural seroma of a nervous system organ or structure following a nervous system procedure: Secondary | ICD-10-CM | POA: Insufficient documentation

## 2023-11-27 DIAGNOSIS — W19XXXA Unspecified fall, initial encounter: Secondary | ICD-10-CM | POA: Insufficient documentation

## 2023-11-27 DIAGNOSIS — Z09 Encounter for follow-up examination after completed treatment for conditions other than malignant neoplasm: Secondary | ICD-10-CM | POA: Insufficient documentation

## 2023-11-27 NOTE — Nursing Note (Signed)
 BP 110/60 (Site: Left Arm, Patient Position: Sitting, Cuff Size: Adult Large)   Pulse 81   Temp 36.6 C (97.8 F) (Thermal Scan)   Wt 66.7 kg (147 lb)   SpO2 100%   BMI 23.73 kg/m

## 2023-11-27 NOTE — Addendum Note (Signed)
 Addended by: DELORISE NESS on: 11/27/2023 11:13 AM     Modules accepted: Orders

## 2023-11-27 NOTE — Result Encounter Note (Signed)
 Dear [name],     Your CT brain showed no hemorrhage.  Mild chronic sinus inflammation present    Jesusita Arm, MD

## 2023-11-27 NOTE — Addendum Note (Signed)
 Addended by: DELORISE NESS on: 11/27/2023 11:17 AM     Modules accepted: Orders
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
## 2023-11-28 ENCOUNTER — Ambulatory Visit: Attending: Registered Nurse | Admitting: Registered Nurse

## 2023-11-28 ENCOUNTER — Encounter (HOSPITAL_COMMUNITY): Payer: Self-pay | Admitting: Registered Nurse

## 2023-11-28 VITALS — BP 113/100 | HR 91 | Temp 97.9°F | Resp 18

## 2023-11-28 DIAGNOSIS — T8189XD Other complications of procedures, not elsewhere classified, subsequent encounter: Secondary | ICD-10-CM | POA: Insufficient documentation

## 2023-11-28 NOTE — Nursing Note (Signed)
 11/28/23 1000   Wound  Incision Lower;Mid Back   Initial Date of Wound Assessment/Initial Time of Wound Assessment: 10/17/23 1003   Wound Approximate Age at First Assessment (Weeks): 3 weeks  Primary Wound Type: Incision  Orientation: Lower;Mid  Location: Back   LDAW Image    Dressing Status Removed   WOUND SITE CLOSURE None   Wound Thickness Full Thickness   State of Healing Non-healing   Periwound Pink   Wound Edges Attached to wound base   Wound Bed slough   Wound Bed Additional Information 76%-100% slough   Wound Length (cm) 0.9 cm   Wound Width (cm) 0.4 cm   Wound Depth (cm) 0.7 cm   Wound Volume (cm^3) 0.132 cm^3   Wound Healing % 96   Wound Surface Area (cm^2) 0.28 cm^2   Wound Undermining No   Wound Tunneling  Yes   Tunneling (Depth (cm)/Location) 3@12    Wound Drainage Amount Moderate   Exudate/Drainage Yellow   Wound Odor Absent   Arrival Dressing Type Open to Air   Wound Cleansed with saline

## 2023-11-28 NOTE — Patient Instructions (Addendum)
Order Questions    Question Answer Comment   Cleanse wound with SOAP AND WATER    Apply to wound  hydrofera blue rope   Secure dressing with Border Dressing    Change Dressing Every Other Day        Tip for Including Protein in Your Diet    Hard or semi soft cheese (e.g. Cheddar, Barnabas Lister, brick) Melt on sandwiches, bread, muffins, tortillas, hamburgers, hot dogs, other meats or fish, vegetables, eggs and desserts such as stewed fruit or pies.    Grate and add to soups, sauces, casseroles, vegetable dishes, potatoes, rice noodles or meat loaf    Serve as a snack with crackers or bagels.   Cottage Cheese/Ricotta Cheese Mix with or use to stuff fruits and vegetables    Add to casseroles, spaghetti, noodles or egg dishes such as omelets, scrambled eggs and souffles.    Use in gelatin, pudding type desserts, cheesecake and pancake or waffle batter    Use to stuff crepes, pasta shells or manicotti    Puree and use as a substitute for sour cream   Milk Use in beverages and in cooking    Use in preparing foods , such as hot cereal, soups, cocoa or pudding    Add cream sauces to vegetable and other dishes    Use evaporated milk, evaporated skim milk or sweetened condensed milk instead of milk or water in recipes   Non-fat Dry Milk Add 1/3 cup nonfat dry powered milk to each cup of regular milk for "double strength" milk.    Add to yogurt and milk drinks such as pasteurized eggnog and milkshakes      Add to scrambled eggs and mashed potatoes    Use in casseroles, meat loaf, hot cereal, breads, muffins, sauces, cream soups, puddings and custards and other milk based desserts.    Caution:  Nonfat dry milk may contain bacteria.  Add nonfat dry milk to a small amount of boiling water before adding to beverages or food that will not be cooked prior to use.  These mixed beverages and food items need to be refrigerated promptly and used within 24 hours of the mixing time.   Commercial Protein Supplements Use nutrition supplements such  as instant breakfast, Sustacal , Ensure , Medtronic , [employer] , [employer]  or [employer] .  These may be purchased in grocery stores or pharmacy departments.    Use instant breakfast powder in milk drinks and desserts such as pudding    Mix with ice cream, milk and fruit or flavorings for a high-protein milk shake    Add powdered protein powder, such as ProCal Supplement , Promod , Pro Source  to foods such as soups, custards,puddings or orange juice or lemonade.   Ice Cream, Yogurt, and Frozen Yogurt Add to carbonated beverage such as ginger ale    Add to milk drinks such a milkshakes    Add to cereals, fruits, gelatin desserts and pies    Blend or whip wit soft or cooked fruits    Sandwich ice cream or frozen yogurt between enriched cake slices, cookies or graham crackers    Use seasoned yogurt as a dip for fruits, vegetables or chips    Use yogurt in place of sour cream in casseroles   Eggs, Egg Whites and Egg Yolks Add chopped , hard cooked eggs to salads and dressings, vegetables, casseroles and creamed meats    Beat eggs into mashed potatoes, vegetable purees and  sauces    Add extra whites to quiches, scrambled eggs, custards, puddings and pancake and French toast batter,    Make a rich custard with egg yolks, double strength milk and sugar.    Add extra hard-cooked yolks to deviled egg filling and sandwich spreads    Caution:  Do not serve raw or undercooked egg products die to risk of salmonella bacteria causing illness.    Note:  Use pasteurized liquid eggs if the food will not be thoroughly cooked,  Pasteurized liquid eggs are available in the frozen food section of grocery stores   Nuts. Seeds and Wheat Germ Add to casseroles, breads, muffins, pancakes, cookies and waffles.    Sprinkle on fruit, cereal, ice cream, yogurt, vegetables, salads and toast as a crunchy topping.    Use in place of breadcrumbs    Blend with parsley or spinach, herbs and cream for a noodle, pasta or vegetable  sauce    Roll banana in chopped nuts   Peanut buter Spread on sandwiches, toast, muffins, crackers, waffles, pancakes or fruit slices.    Use as a dip for raw vegetables, such as carrots, cauliflower and celery    Blend with milk drinks and beverages    Swirl through soft ice cream and yogurt    Spread on a banana and then roll banana in crushed cereal or chopped nuts.   Meat and Fish Add chopped cooked meat or fish to vegetables, salads,  casseroles, soups, sauces and biscuit dough.    Use in omelets, souffles, quiches and sandwich fillings.    Add chicken and turkey to stuffings    Wrap in piecrust or biscuit dough as turnovers    Add to stuffed baked potatoes    Add pureed meat to soups    Eat calves or chicken liver or heart, which are especially good sources of protein, vitamins and minerals   Beans Cook and use dried peas, beans and bean curd (tofu) in soups or add to casseroles, pastas and grain dishes that also contain cheese or meat.    Mash with cheese and milk    Use tofu to make smoothies.   Smoking Cessation resources are available by calling 1-800-QUIT-NOW (1-800-784-8669)    Thank you for choosing [facility] for your medical care.  We hope you were satisfied with the care you received.  Please contact the Patient Advocate at [phone] if you have any questions or concerns related to your care.    If you experience a life threatening emergency, call 911 immediately or proceed to the closet ER.     If someone you know is dealing with an addiction issue and seeking help call the HELP4WV Hotline at 1-844-HELP4WV (1844-435-7594).      If you need to schedule an appointment such as an ultrasound or an MRI or other advanced imaging, please call [facility] scheduling at [phone].    Please remember to take ALL your belongings with you when you leave your appointment.      To assist us in providing quality care, you may receive a patient satisfaction survey in the mail.  Please take a  few minutes to complete the survey, as we value your feedback and look for opportunities to serve you better.
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
## 2023-12-05 ENCOUNTER — Ambulatory Visit: Attending: Registered Nurse | Admitting: Registered Nurse

## 2023-12-05 ENCOUNTER — Other Ambulatory Visit: Payer: Self-pay

## 2023-12-05 DIAGNOSIS — T8189XD Other complications of procedures, not elsewhere classified, subsequent encounter: Secondary | ICD-10-CM

## 2023-12-05 DIAGNOSIS — T8189XS Other complications of procedures, not elsewhere classified, sequela: Secondary | ICD-10-CM

## 2023-12-05 DIAGNOSIS — T8131XD Disruption of external operation (surgical) wound, not elsewhere classified, subsequent encounter: Secondary | ICD-10-CM | POA: Insufficient documentation
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
## 2023-12-05 NOTE — Patient Instructions (Signed)
 Tip for Including Protein in Your Diet    Hard or semi soft cheese (e.g. Cheddar, Jack, brick) Melt on sandwiches, bread, muffins, tortillas, hamburgers, hot dogs, other meats or fish, vegetables, eggs and desserts such as stewed fruit or pies.    Grate and add to soups, sauces, casseroles, vegetable dishes, potatoes, rice noodles or meat loaf    Serve as a snack with crackers or bagels.   Cottage Cheese/Ricotta Cheese Mix with or use to stuff fruits and vegetables    Add to casseroles, spaghetti, noodles or egg dishes such as omelets, scrambled eggs and souffles.    Use in gelatin, pudding type desserts, cheesecake and pancake or waffle batter    Use to stuff crepes, pasta shells or manicotti    Puree and use as a substitute for sour cream   Milk Use in beverages and in cooking    Use in preparing foods , such as hot cereal, soups, cocoa or pudding    Add cream sauces to vegetable and other dishes    Use evaporated milk, evaporated skim milk or sweetened condensed milk instead of milk or water in recipes   Non-fat Dry Milk Add 1/3 cup nonfat dry powered milk to each cup of regular milk for "double strength" milk.    Add to yogurt and milk drinks such as pasteurized eggnog and milkshakes      Add to scrambled eggs and mashed potatoes    Use in casseroles, meat loaf, hot cereal, breads, muffins, sauces, cream soups, puddings and custards and other milk based desserts.    Caution:  Nonfat dry milk may contain bacteria.  Add nonfat dry milk to a small amount of boiling water before adding to beverages or food that will not be cooked prior to use.  These mixed beverages and food items need to be refrigerated promptly and used within 24 hours of the mixing time.   Commercial Protein Supplements Use nutrition supplements such as instant breakfast, Sustacal , Ensure , [employer] , [employer] , [employer]  or [employer] .  These may be purchased in grocery stores or pharmacy departments.    Use instant breakfast  powder in milk drinks and desserts such as pudding    Mix with ice cream, milk and fruit or flavorings for a high-protein milk shake    Add powdered protein powder, such as ProCal Supplement , Promod , Pro Source  to foods such as soups, custards,puddings or orange juice or lemonade.   Ice Cream, Yogurt, and Frozen Yogurt Add to carbonated beverage such as ginger ale    Add to milk drinks such a milkshakes    Add to cereals, fruits, gelatin desserts and pies    Blend or whip wit soft or cooked fruits    Sandwich ice cream or frozen yogurt between enriched cake slices, cookies or graham crackers    Use seasoned yogurt as a dip for fruits, vegetables or chips    Use yogurt in place of sour cream in casseroles   Eggs, Egg Whites and Egg Yolks Add chopped , hard cooked eggs to salads and dressings, vegetables, casseroles and creamed meats    Beat eggs into mashed potatoes, vegetable purees and sauces    Add extra whites to quiches, scrambled eggs, custards, puddings and pancake and [place] toast batter,    Make a rich custard with egg yolks, double strength milk and sugar.    Add extra hard-cooked yolks to deviled egg filling and sandwich spreads  Caution:  Do not serve raw or undercooked egg products die to risk of salmonella bacteria causing illness.    Note:  Use pasteurized liquid eggs if the food will not be thoroughly cooked,  Pasteurized liquid eggs are available in the frozen food section of grocery stores   Nuts. Seeds and Wheat Germ Add to casseroles, breads, muffins, pancakes, cookies and waffles.    Sprinkle on fruit, cereal, ice cream, yogurt, vegetables, salads and toast as a crunchy topping.    Use in place of breadcrumbs    Blend with parsley or spinach, herbs and cream for a noodle, pasta or vegetable sauce    Roll banana in chopped nuts   Peanut buter Spread on sandwiches, toast, muffins, crackers, waffles, pancakes or fruit slices.    Use as a dip for raw vegetables, such as carrots, cauliflower  and celery    Blend with milk drinks and beverages    Swirl through soft ice cream and yogurt    Spread on a banana and then roll banana in crushed cereal or chopped nuts.   Meat and Fish Add chopped cooked meat or fish to vegetables, salads,  casseroles, soups, sauces and biscuit dough.    Use in omelets, souffles, quiches and sandwich fillings.    Add chicken and [place] to [employer] in piecrust or biscuit dough as turnovers    Add to stuffed baked potatoes    Add pureed meat to soups    Eat calves or chicken liver or heart, which are especially good sources of protein, vitamins and minerals   Beans Cook and use dried peas, beans and bean curd (tofu) in soups or add to casseroles, pastas and grain dishes that also contain cheese or meat.    Mash with cheese and milk    Use tofu to make smoothies.   Smoking Cessation resources are available by calling 1-800-QUIT-NOW 828-868-2037)    Thank you for choosing [facility] for your medical care.  We hope you were satisfied with the care you received.  Please contact the Patient Advocate at [phone] if you have any questions or concerns related to your care.    If you experience a life threatening emergency, call 911 immediately or proceed to the closet ER.     If someone you know is dealing with an addiction issue and seeking help call the HELP4WV Hotline at 1-844-HELP4WV ((954)088-0978).      If you need to schedule an appointment such as an ultrasound or an MRI or other advanced imaging, please call [facility] scheduling at [phone].    Please remember to take ALL your belongings with you when you leave your appointment.      To assist [place] in providing quality care, you may receive a patient satisfaction survey in the mail.  Please take a few minutes to complete the survey, as we value your feedback and look for opportunities to serve you better.

## 2023-12-05 NOTE — Nursing Note (Signed)
 12/05/23 1100   Wound  Incision Lower;Mid Back   Initial Date of Wound Assessment/Initial Time of Wound Assessment: 10/17/23 1003   Wound Approximate Age at First Assessment (Weeks): 3 weeks  Primary Wound Type: Incision  Orientation: Lower;Mid  Location: Back   LDAW Image    WOUND SITE CLOSURE None   Wound Thickness Full Thickness   State of Healing Non-healing   Periwound Pink   Wound Edges Attached to wound base   Wound Bed unable to visualize wound base   Wound Length (cm) 0.8 cm   Wound Width (cm) 0.2 cm   Wound Depth (cm) 0.8 cm   Wound Volume (cm^3) 0.067 cm^3   Wound Healing % 98   Wound Surface Area (cm^2) 0.13 cm^2   Wound Undermining No   Wound Tunneling  No   Wound Drainage Amount None   Wound Odor Absent   Arrival Dressing Type Open to Air   Wound Cleansed with saline   DRESSING TYPE Open to [employer]

## 2023-12-12 ENCOUNTER — Ambulatory Visit: Attending: Registered Nurse | Admitting: Registered Nurse

## 2023-12-12 ENCOUNTER — Encounter (HOSPITAL_COMMUNITY): Payer: Self-pay | Admitting: Registered Nurse

## 2023-12-12 ENCOUNTER — Other Ambulatory Visit: Payer: Self-pay

## 2023-12-12 VITALS — BP 111/90 | HR 93 | Temp 97.2°F | Resp 18

## 2023-12-12 DIAGNOSIS — T8131XA Disruption of external operation (surgical) wound, not elsewhere classified, initial encounter: Secondary | ICD-10-CM | POA: Insufficient documentation

## 2023-12-12 DIAGNOSIS — T8189XS Other complications of procedures, not elsewhere classified, sequela: Secondary | ICD-10-CM

## 2023-12-12 DIAGNOSIS — T8189XD Other complications of procedures, not elsewhere classified, subsequent encounter: Secondary | ICD-10-CM
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
## 2023-12-12 NOTE — Patient Instructions (Signed)
Smoking Cessation resources are available by calling 1-800-QUIT-NOW (1-800-784-8669)    Thank you for choosing [facility] for your medical care.  We hope you were satisfied with the care you received.  Please contact the Patient Advocate at [phone] if you have any questions or concerns related to your care.    If you experience a life threatening emergency, call 911 immediately or proceed to the closet ER.     If someone you know is dealing with an addiction issue and seeking help call the HELP4WV Hotline at 1-844-HELP4WV (1844-435-7594).      If you need to schedule an appointment such as an ultrasound or an MRI or other advanced imaging, please call [facility] scheduling at [phone].    Please remember to take ALL your belongings with you when you leave your appointment.      To assist us in providing quality care, you may receive a patient satisfaction survey in the mail.  Please take a few minutes to complete the survey, as we value your feedback and look for opportunities to serve you better. SIGNS OF INFECTION OR ALLERGY    If you feel you are having an allergic reaction to medicine you should proceed to the nearest Emergency Department.    Some signs of an allergic reaction are but not limited to:     *ITCHING   *CHEST PAIN   *RED OR BLOODY WELTS   *SWELLING (usually swelling of any body part)   *DIFFICULTY BREATHING   *SEIZURES   *UNCONSCIOUSNESS    Some signs of infection at the site of the injection are but not limited to:     *REDNESS   *INCREASE IN PAIN AT THE PUNCTURE SITE - Not to be confused with the                     increase in pain of your diagnostic problem for which the injection was given.   *SWELLING - at the puncture site that cannot be relieved by elevation of the body              part or with application of ice at the site.    *DRAINAGE - of fluid from the site that is foul smelling, has color that may be                     white, yellow, gray or green.   *FEVER  - greater than 100.5 degree by mouth and lasting longer than one day.    *INCREASE in warmth to touch around the puncture site associated with these                  symptoms.

## 2023-12-12 NOTE — Nursing Note (Signed)
 12/12/23 1100   Wound  Incision Lower;Mid Back   Initial Date of Wound Assessment/Initial Time of Wound Assessment: 10/17/23 1003   Wound Approximate Age at First Assessment (Weeks): 3 weeks  Primary Wound Type: Incision  Orientation: Lower;Mid  Location: Back   LDAW Image    Dressing Status Removed   WOUND SITE CLOSURE None   Wound Thickness Full Thickness   State of Healing Non-healing   Periwound Intact   Wound Edges Attached to wound base   Wound Bed unable to visualize wound base   Wound Length (cm) 0.5 cm   Wound Width (cm) 0.4 cm   Wound Depth (cm) 0.5 cm   Wound Volume (cm^3) 0.052 cm^3   Wound Healing % 98   Wound Surface Area (cm^2) 0.16 cm^2   Wound Undermining No   Wound Tunneling  Yes   Tunneling (Depth (cm)/Location) 0.9@12    Wound Drainage Amount Scant   Exudate/Drainage Yellow   Wound Odor Absent   Arrival Dressing Type Border Foam   Wound Cleansed with saline

## 2023-12-26 ENCOUNTER — Ambulatory Visit: Admitting: Registered Nurse

## 2023-12-27 NOTE — Home Health (Signed)
 Visit Synopsis Skilled nursing need at this Visit: SN assessment Spinal wound assessment and wound care as per providers orders Pain assessment Medication review and education Follow up w/ [name] Sick NP wound care 7/23- To be rescheduled as patient will be out of town for to see oncologist in [place]  Reason to continue skilled nursing care: See above Projected discharge date: TBD (See goal notes for progression toward goals) Upon arrival to patients home, this RN greeted at front door of the home by patient. She is ambulating around the home using a standard cane. Her gait is weak but steady. She denies any recent falls. She is alert and oriented X4. She is pleasant and cooporative. She is able to follow commands. Patient reports continued 7/10 nerve pain to her back with activity. Compete head to toe assessment performed- see flowsheet. VSS. Spinal surgical wound assessed and LDAW updated at todays SN visit- see LDAW for complete assessment. Wound remains free from obvious S/S of infection. There is no erythema, no warmth, no purulent drainage, no malodor present. Patient denies any fever/chills. Wound care performed by this RN as per providers orders. Patient tolerated wound care well. See interventions. Medications reviewed and education provided. Patient denies any further concerns at this time.

## 2024-01-02 ENCOUNTER — Ambulatory Visit: Attending: Registered Nurse | Admitting: Registered Nurse

## 2024-01-02 ENCOUNTER — Other Ambulatory Visit: Payer: Self-pay

## 2024-01-02 VITALS — Temp 96.3°F

## 2024-01-02 DIAGNOSIS — T8189XS Other complications of procedures, not elsewhere classified, sequela: Secondary | ICD-10-CM

## 2024-01-02 DIAGNOSIS — T8189XD Other complications of procedures, not elsewhere classified, subsequent encounter: Secondary | ICD-10-CM | POA: Insufficient documentation

## 2024-01-02 NOTE — Patient Instructions (Addendum)
 Order Questions    Question Answer Comment   Cleanse wound with SOAP AND WATER     Apply to wound  collagen   Secure dressing with Border Dressing    Change Dressing Monday, Wednesday, Friday        Tip for Including Protein in Your Diet    Hard or semi soft cheese (e.g. Cheddar, [place], brick) Melt on sandwiches, bread, muffins, tortillas, hamburgers, hot dogs, other meats or fish, vegetables, eggs and desserts such as stewed fruit or pies.    Grate and add to soups, sauces, casseroles, vegetable dishes, potatoes, rice noodles or meat loaf    Serve as a snack with crackers or bagels.   Cottage Cheese/Ricotta Cheese Mix with or use to stuff fruits and vegetables    Add to casseroles, spaghetti, noodles or egg dishes such as omelets, scrambled eggs and souffles.    Use in gelatin, pudding type desserts, cheesecake and pancake or waffle batter    Use to stuff crepes, pasta shells or manicotti    Puree and use as a substitute for sour cream   Milk Use in beverages and in cooking    Use in preparing foods , such as hot cereal, soups, cocoa or pudding    Add cream sauces to vegetable and other dishes    Use evaporated milk, evaporated skim milk or sweetened condensed milk instead of milk or water  in recipes   Non-fat Dry Milk Add 1/3 cup nonfat dry powered milk to each cup of regular milk for double strength milk.    Add to yogurt and milk drinks such as pasteurized eggnog and milkshakes      Add to scrambled eggs and mashed potatoes    Use in casseroles, meat loaf, hot cereal, breads, muffins, sauces, cream soups, puddings and custards and other milk based desserts.    Caution:  Nonfat dry milk may contain bacteria.  Add nonfat dry milk to a small amount of boiling water  before adding to beverages or food that will not be cooked prior to use.  These mixed beverages and food items need to be refrigerated promptly and used within 24 hours of the mixing time.   Commercial Protein Supplements Use nutrition supplements such  as instant breakfast, Sustacal , Ensure , [employer] , [employer] , [employer]  or [employer] .  These may be purchased in grocery stores or pharmacy departments.    Use instant breakfast powder in milk drinks and desserts such as pudding    Mix with ice cream, milk and fruit or flavorings for a high-protein milk shake    Add powdered protein powder, such as ProCal Supplement , Promod , Pro Source  to foods such as soups, custards,puddings or orange juice or lemonade.   Ice Cream, Yogurt, and Frozen Yogurt Add to carbonated beverage such as ginger ale    Add to milk drinks such a milkshakes    Add to cereals, fruits, gelatin desserts and pies    Blend or whip wit soft or cooked fruits    Sandwich ice cream or frozen yogurt between enriched cake slices, cookies or graham crackers    Use seasoned yogurt as a dip for fruits, vegetables or chips    Use yogurt in place of sour cream in casseroles   Eggs, Egg Whites and Egg Yolks Add chopped , hard cooked eggs to salads and dressings, vegetables, casseroles and creamed meats    Beat eggs into mashed potatoes, vegetable purees and sauces  Add extra whites to quiches, scrambled eggs, custards, puddings and pancake and [place] toast batter,    Make a rich custard with egg yolks, double strength milk and sugar.    Add extra hard-cooked yolks to deviled egg filling and sandwich spreads    Caution:  Do not serve raw or undercooked egg products die to risk of salmonella bacteria causing illness.    Note:  Use pasteurized liquid eggs if the food will not be thoroughly cooked,  Pasteurized liquid eggs are available in the frozen food section of grocery stores   Nuts. Seeds and Wheat Germ Add to casseroles, breads, muffins, pancakes, cookies and waffles.    Sprinkle on fruit, cereal, ice cream, yogurt, vegetables, salads and toast as a crunchy topping.    Use in place of breadcrumbs    Blend with parsley or spinach, herbs and cream for a noodle, pasta or vegetable  sauce    Roll banana in chopped nuts   Peanut buter Spread on sandwiches, toast, muffins, crackers, waffles, pancakes or fruit slices.    Use as a dip for raw vegetables, such as carrots, cauliflower and celery    Blend with milk drinks and beverages    Swirl through soft ice cream and yogurt    Spread on a banana and then roll banana in crushed cereal or chopped nuts.   Meat and Fish Add chopped cooked meat or fish to vegetables, salads,  casseroles, soups, sauces and biscuit dough.    Use in omelets, souffles, quiches and sandwich fillings.    Add chicken and [place] to [employer] in piecrust or biscuit dough as turnovers    Add to stuffed baked potatoes    Add pureed meat to soups    Eat calves or chicken liver or heart, which are especially good sources of protein, vitamins and minerals   Beans Cook and use dried peas, beans and bean curd (tofu) in soups or add to casseroles, pastas and grain dishes that also contain cheese or meat.    Mash with cheese and milk    Use tofu to make smoothies.   Smoking Cessation resources are available by calling 1-800-QUIT-NOW (347)691-8806)    Thank you for choosing [facility] for your medical care.  We hope you were satisfied with the care you received.  Please contact the Patient Advocate at [phone] if you have any questions or concerns related to your care.    If you experience a life threatening emergency, call 911 immediately or proceed to the closet ER.     If someone you know is dealing with an addiction issue and seeking help call the HELP4[facility] Hotline at 1-844-HELP4WV (909-797-8285).      If you need to schedule an appointment such as an ultrasound or an MRI or other advanced imaging, please call [facility] scheduling at [phone].    Please remember to take ALL your belongings with you when you leave your appointment.      To assist us  in providing quality care, you may receive a patient satisfaction survey in the mail.  Please take a  few minutes to complete the survey, as we value your feedback and look for opportunities to serve you better.

## 2024-01-02 NOTE — Nursing Note (Signed)
 01/02/24 1000   Wound  Incision Lower;Mid Back   Initial Date of Wound Assessment/Initial Time of Wound Assessment: 10/17/23 1003   Wound Approximate Age at First Assessment (Weeks): 3 weeks  Primary Wound Type: Incision  Orientation: Lower;Mid  Location: Back   LDAW Image    Dressing Status Removed   Wound Thickness Full Thickness   State of Healing Non-healing   Periwound Intact   Wound Edges Not attached to wound base   Wound Bed unable to visualize wound base   Wound Length (cm) 0.4 cm   Wound Width (cm) 0.2 cm   Wound Depth (cm) 0.6 cm   Wound Volume (cm^3) 0.025 cm^3   Wound Healing % 99   Wound Surface Area (cm^2) 0.06 cm^2   Wound Undermining No   Wound Tunneling  Yes   Tunneling (Depth (cm)/Location) 1.3@12    Wound Drainage Amount Small   Exudate/Drainage Yellow   Wound Odor Absent   Arrival Dressing Type Border Foam
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
## 2024-01-16 ENCOUNTER — Ambulatory Visit: Admitting: Registered Nurse

## 2024-01-16 ENCOUNTER — Other Ambulatory Visit (INDEPENDENT_AMBULATORY_CARE_PROVIDER_SITE_OTHER): Payer: Self-pay | Admitting: Internal Medicine

## 2024-01-16 DIAGNOSIS — F419 Anxiety disorder, unspecified: Secondary | ICD-10-CM

## 2024-01-16 NOTE — Telephone Encounter (Signed)
 LOV: 11/27/2023   NOV: 02/06/2024      Odean Ponto  Clinical Medical Assistant

## 2024-01-23 ENCOUNTER — Ambulatory Visit (INDEPENDENT_AMBULATORY_CARE_PROVIDER_SITE_OTHER): Admitting: INTERVENTIONAL CARDIOLOGY

## 2024-01-25 ENCOUNTER — Ambulatory Visit (INDEPENDENT_AMBULATORY_CARE_PROVIDER_SITE_OTHER): Admitting: INTERVENTIONAL CARDIOLOGY

## 2024-01-27 ENCOUNTER — Other Ambulatory Visit (INDEPENDENT_AMBULATORY_CARE_PROVIDER_SITE_OTHER): Payer: Self-pay | Admitting: Internal Medicine

## 2024-01-27 DIAGNOSIS — K5903 Drug induced constipation: Secondary | ICD-10-CM

## 2024-01-27 NOTE — Telephone Encounter (Signed)
 LOV 6/24  NOV 9/23    Ivalene Platte B. Rik, BSN, RN  Clinical Nurse Coordinator

## 2024-01-30 ENCOUNTER — Ambulatory Visit: Attending: Registered Nurse | Admitting: Registered Nurse

## 2024-01-30 ENCOUNTER — Other Ambulatory Visit: Payer: Self-pay

## 2024-01-30 VITALS — BP 119/67 | HR 88 | Temp 95.7°F | Resp 16

## 2024-01-30 DIAGNOSIS — T8189XD Other complications of procedures, not elsewhere classified, subsequent encounter: Secondary | ICD-10-CM

## 2024-01-30 DIAGNOSIS — T8189XS Other complications of procedures, not elsewhere classified, sequela: Secondary | ICD-10-CM | POA: Insufficient documentation

## 2024-01-30 DIAGNOSIS — Z09 Encounter for follow-up examination after completed treatment for conditions other than malignant neoplasm: Secondary | ICD-10-CM | POA: Insufficient documentation

## 2024-01-30 DIAGNOSIS — Z87828 Personal history of other (healed) physical injury and trauma: Secondary | ICD-10-CM | POA: Insufficient documentation
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
## 2024-01-30 NOTE — Nursing Note (Signed)
 01/30/24 1100   Wound  Incision Lower;Mid Back   Initial Date of Wound Assessment/Initial Time of Wound Assessment: 10/17/23 1003   Wound Approximate Age at First Assessment (Weeks): 3 weeks  Primary Wound Type: Incision  Orientation: Lower;Mid  Location: Back   LDAW Image    Dressing Status Removed   Periwound Intact   Wound Edges Attached to wound base   Wound Bed epithelial   Wound Bed Additional Information 76%-100% epithelial   Wound Length (cm) 0 cm   Wound Width (cm) 0 cm   Wound Depth (cm) 0 cm   Wound Volume (cm^3) 0 cm^3   Wound Healing % 100   Wound Surface Area (cm^2) 0 cm^2   Wound Drainage Amount None   Arrival Dressing Type Border Foam

## 2024-02-01 ENCOUNTER — Ambulatory Visit (INDEPENDENT_AMBULATORY_CARE_PROVIDER_SITE_OTHER): Payer: Self-pay | Admitting: Internal Medicine

## 2024-02-01 ENCOUNTER — Ambulatory Visit (INDEPENDENT_AMBULATORY_CARE_PROVIDER_SITE_OTHER): Admitting: INTERVENTIONAL CARDIOLOGY

## 2024-02-06 ENCOUNTER — Ambulatory Visit: Attending: Hematology & Oncology

## 2024-02-06 ENCOUNTER — Other Ambulatory Visit: Payer: Self-pay

## 2024-02-06 ENCOUNTER — Encounter (INDEPENDENT_AMBULATORY_CARE_PROVIDER_SITE_OTHER): Payer: Self-pay | Admitting: Internal Medicine

## 2024-02-06 ENCOUNTER — Ambulatory Visit: Payer: Self-pay | Attending: Internal Medicine | Admitting: Internal Medicine

## 2024-02-06 VITALS — BP 108/68 | HR 78 | Temp 97.3°F | Wt 150.0 lb

## 2024-02-06 DIAGNOSIS — M545 Low back pain, unspecified: Secondary | ICD-10-CM | POA: Insufficient documentation

## 2024-02-06 DIAGNOSIS — D509 Iron deficiency anemia, unspecified: Secondary | ICD-10-CM | POA: Insufficient documentation

## 2024-02-06 DIAGNOSIS — Z1231 Encounter for screening mammogram for malignant neoplasm of breast: Secondary | ICD-10-CM | POA: Insufficient documentation

## 2024-02-06 DIAGNOSIS — R5383 Other fatigue: Secondary | ICD-10-CM | POA: Insufficient documentation

## 2024-02-06 DIAGNOSIS — Z23 Encounter for immunization: Secondary | ICD-10-CM | POA: Insufficient documentation

## 2024-02-06 DIAGNOSIS — G43009 Migraine without aura, not intractable, without status migrainosus: Secondary | ICD-10-CM | POA: Insufficient documentation

## 2024-02-06 DIAGNOSIS — C7989 Secondary malignant neoplasm of other specified sites: Secondary | ICD-10-CM | POA: Insufficient documentation

## 2024-02-06 DIAGNOSIS — N39490 Overflow incontinence: Secondary | ICD-10-CM | POA: Insufficient documentation

## 2024-02-06 DIAGNOSIS — Z0000 Encounter for general adult medical examination without abnormal findings: Secondary | ICD-10-CM | POA: Insufficient documentation

## 2024-02-06 DIAGNOSIS — Z85118 Personal history of other malignant neoplasm of bronchus and lung: Secondary | ICD-10-CM | POA: Insufficient documentation

## 2024-02-06 DIAGNOSIS — K5903 Drug induced constipation: Secondary | ICD-10-CM | POA: Insufficient documentation

## 2024-02-06 LAB — CBC WITH DIFF
BASOPHIL #: <0.1 x10ˆ3/uL (ref <=0.20)
BASOPHIL %: 0.8 %
EOSINOPHIL #: 0.21 x10ˆ3/uL (ref <=0.50)
EOSINOPHIL %: 3.3 %
HCT: 32.9 % — ABNORMAL LOW (ref 34.8–46.0)
HGB: 10.6 g/dL — ABNORMAL LOW (ref 11.5–16.0)
IMMATURE GRANULOCYTE #: <0.1 x10ˆ3/uL (ref <0.10)
IMMATURE GRANULOCYTE %: 0.3 % (ref 0.0–1.0)
LYMPHOCYTE #: 1.98 x10ˆ3/uL (ref 1.00–4.80)
LYMPHOCYTE %: 31.3 %
MCH: 30.3 pg (ref 26.0–32.0)
MCHC: 32.2 g/dL (ref 31.0–35.5)
MCV: 94 fL (ref 78.0–100.0)
MONOCYTE #: 0.66 x10ˆ3/uL (ref 0.20–1.10)
MONOCYTE %: 10.4 %
MPV: 12.8 fL — ABNORMAL HIGH (ref 8.7–12.5)
NEUTROPHIL #: 3.41 x10ˆ3/uL (ref 1.50–7.70)
NEUTROPHIL %: 53.9 %
PLATELETS: 211 x10ˆ3/uL (ref 150–400)
RBC: 3.5 x10ˆ6/uL — ABNORMAL LOW (ref 3.85–5.22)
RDW-CV: 16.1 % — ABNORMAL HIGH (ref 11.5–15.5)
WBC: 6.3 x10ˆ3/uL (ref 3.7–11.0)

## 2024-02-06 LAB — COMPREHENSIVE METABOLIC PANEL, NON-FASTING
ALBUMIN: 3.6 g/dL (ref 3.4–4.8)
ALKALINE PHOSPHATASE: 64 U/L (ref 55–145)
ALT (SGPT): 22 U/L (ref <31)
ANION GAP: 9 mmol/L (ref 4–13)
AST (SGOT): 37 U/L — ABNORMAL HIGH (ref 11–34)
BILIRUBIN TOTAL: 0.3 mg/dL (ref 0.3–1.3)
BUN/CREA RATIO: 52 — ABNORMAL HIGH (ref 6–22)
BUN: 44 mg/dL — ABNORMAL HIGH (ref 8–25)
CALCIUM: 9.1 mg/dL (ref 8.6–10.3)
CHLORIDE: 105 mmol/L (ref 96–111)
CO2 TOTAL: 22 mmol/L — ABNORMAL LOW (ref 23–31)
CREATININE: 0.85 mg/dL (ref 0.60–1.05)
GLUCOSE: 86 mg/dL (ref 65–125)
POTASSIUM: 5.1 mmol/L (ref 3.5–5.1)
PROTEIN TOTAL: 7.3 g/dL (ref 5.6–7.6)
SODIUM: 136 mmol/L (ref 136–145)
eGFRcr - FEMALE: 72 mL/min/1.73mˆ2 (ref >=60)

## 2024-02-06 MED ORDER — EMGALITY 120 MG/ML SUBCUTANEOUS SYRINGE
INJECTION | SUBCUTANEOUS | 5 refills | Status: DC
Start: 2024-02-06 — End: 2024-04-15

## 2024-02-06 NOTE — Nursing Note (Signed)
 02/06/24 1453   Recent Weight Change   Have you had a recent unexplained weight loss or gain? N   Domestic Violence   Because we are aware of abuse and domestic violence today, we ask all patients: Are you being hurt, hit, or frightened by anyone at your home or in your life?  N   Basic Needs   Do you have any basic needs within your home that are not being met? (such as Food, Shelter, [employer] fast streamer, Tranportation, paying for bills and/or medications) N
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
## 2024-02-06 NOTE — Nursing Note (Signed)
 BP 108/68 (Site: Left Arm, Patient Position: Sitting, Cuff Size: Adult Large)   Pulse 78   Temp 36.3 C (97.3 F) (Thermal Scan)   Wt 68 kg (150 lb)   SpO2 97%   BMI 24.21 kg/m   Powell Perry CMA

## 2024-02-06 NOTE — Patient Instructions (Addendum)
 Medicare Preventive Services  Medicare coverage information Recommendation for YOU   Heart Disease and Diabetes   Lipid profile Every 5 years or more often if at risk for cardiovascular disease     Lab Results   Component Value Date    CHOLESTEROL 220 (H) 06/11/2013    HDLCHOL 99 (H) 06/11/2013    LDLCHOL 114 06/11/2013    TRIG 34 (D) 06/11/2013         Diabetes Screening    Yearly for those at risk for diabetes, 2 tests per year for those with prediabetes Last Glucose:      Diabetes Self Management Training or Medical Nutrition Therapy  For those with diabetes, up to 10 hrs initial training within a year, subsequent years up to 2 hrs of follow up training Optional for those with diabetes     Medical Nutrition Therapy  Three hours of one-on-one counseling in first year, two hours in subsequent years Optional for those with diabetes, kidney disease   Intensive Behavioral Therapy for Obesity  Face-to-face counseling, first month every week, month 2-6 every other week, month 7-12 every month if continued progress is documented Optional for those with Body Mass Index 30 or higher  Your Body mass index is 24.21 kg/m.   Tobacco Cessation (Quitting) Counseling   Covers up to 8 smoking and tobacco-use cessation counseling sessions in a 12-month period.    Optional for those that use tobacco   Cancer Screening Last Completion Date   Colorectal screening   For anyone age 68 to 31 or any age if high risk:  Screening Colonoscopy every 10 yrs if low risk,  more frequent if higher risk  OR  Cologuard Stool DNA test once every 3 years OR  Fecal Occult Blood Testing yearly OR  Flexible  Sigmoidoscopy  every 5 yr OR  CT Colonography every 5 yrs    --01/31/2021  See below for due date if applicable.   Screening Pap Test   Recommended every 3 years for all women age 78 to 78, or every five years if combined with HPV test (routine screening not needed after total hysterectomy).  Medicare covers every 2 years or yearly if high  risk.  Screening Pelvic Exam   Medicare covers every 2 years, yearly if high risk or childbearing age with abnormal Pap in last 3 yrs.     See below for due date if applicable.   Screening Mammogram   Recommended every 2 years for women age 50 to 17, or more frequent if you have a higher risk. Selectively recommended for women between 40-49 based on shared decisions about risk. Covered by Medicare up to every year for women age 45 or older --05/30/2022  See below for due date if applicable.         Lung Cancer Screening  Annual low dose computed tomography (LDCT scan) is recommended for those age 15-80 who smoked 20 pack-years and are current smokers or quit smoking within past 15 years, after counseling by your doctor or nurse clinician about the possible benefits or harms.     See below for due date if applicable.   Vaccinations   Respiratory syncytial virus (RSV)  Age 13 years or older: Based on shared clinical decision-making with your provider.  Pneumococcal Vaccine  Recommended routinely age 37+ with one or two separate vaccines based on your risk. Recommended before age 33 if medical conditions with increased risk  Seasonal Influenza Vaccine  Once every flu season  Hepatitis B Vaccine  3 doses if risk (including anyone with diabetes or liver disease)  Shingles Vaccine  Two doses at age 54 or older  Diphtheria Tetanus Pertussis Vaccine  ONCE as adult, booster every 10 years     Immunization History   Administered Date(s) Administered    ABRYSVO (ADULT RSV) 05/22/2023    Covid-19 Vaccine,Pfizer Bivalent,82mcg/0.3ml,12 yrs+ 02/25/2021    Covid-19 Vaccine,Pfizer-BioNTech,Gray Top,19yrs+ 08/30/2020    Covid-19 Vaccine,Pfizer-BioNTech,Purple Top,62yrs+ 12/19/2019, 01/16/2020    FLUZONE HD VACCINE (ADMIN) 04/05/2020    HAEMOPHILUS B CONJUGATE VACCINE 09/15/2021    Haemophilis B Conjugate Vaccine 09/15/2021    High-Dose Influenza Vaccine, 65+ 02/15/2016, 04/05/2020    Influenza Vaccine, 65+ 02/15/2016, 02/25/2021,  02/22/2022, 04/06/2023    Meningococcal B Vaccine - 2 Dose 09/15/2021    Meningococcal Cyw-135 Vaccine (2 Of 2) 09/15/2021    PREVNAR 13 07/25/2021    Pneumovax 02/15/2016, 10/03/2021    ZOSTAVAX (VARICELLA ZOSTER VACCINE) 06/01/2011     Shingles vaccine and Diphtheria Tetanus Pertussis vaccines are available at pharmacies or local health department without a prescription.   Other Preventative Screening  Last Completion Date   Bone Densitometry   Screening: All females ages 45 and older every 10 years if initial screening normal. Postmenopausal women ages 51-64 need screening with one or more risk factor: previous fracture, parental hip fracture, current smoker, low body weight, excessive alcohol use, Rheumatoid Arthritis   For women with diagnosed Osteoporosis, follow up is recommended every 2 years or a frequency recommended by your provider.     --06/21/2023  See below for due date if applicable.     Glaucoma Screening   Yearly if in high risk group such as diabetes, family history, African American age 24+ or Hispanic American age 41+   See your eye care provider for screening.   Hepatitis C Screening   Recommended  for those born between ages 18-79 years.     See below for due date if applicable.     HIV Testing  Recommended routinely at least ONCE, covered every year for age 74 to 15 regardless of risk, and every year for age over 60 who ask for the test or higher risk. Yearly or up to 3 times in pregnancy         See below for due date if applicable.   Abdominal Aortic Aneurysm Screening Ultrasound   Once with a family history of abdominal aortic aneurysms OR a female between 45-75 and have smoked at least 100 cigarettes in your lifetime.         See below for due date if applicable.       Your Personalized Schedule for Preventive Tests     Health Maintenance: Pending and Last Completed         Date Due Completion Date    Hepatitis C screening Never done ---    Adult Tdap-Td (1 - Tdap) Never done ---     Shingles Vaccine (1 of 2) 07/27/2011 06/01/2011    Meningococcal B Vaccine (2 of 4 - Increased Risk Bexsero  3-dose series) 10/13/2021 09/15/2021    Meningococcal Vaccine (2 - Risk 2-dose series) 11/10/2021 09/15/2021    Influenza Vaccine (1) 02/04/2024 04/06/2023    Covid-19 Vaccine (5 - 2025-26 season) 02/04/2024 02/25/2021    Breast Cancer Screening 05/30/2024 05/30/2022    Depression Screening 02/05/2025 02/06/2024    Osteoporosis screening 06/20/2025 06/21/2023    Colonoscopy 01/31/2026 01/31/2021  You are due for the flu, shingles, Covid and Tdap(tetanus) vaccines. You can call [phone] option #2 to schedule your mammogram.     For Information on Advanced Directives for Health Care:  [place]:  [email or web address]  [place], [place], [place], [place] General Information: [email or web address]
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
## 2024-02-06 NOTE — Nursing Note (Signed)
 02/06/24 1545   Medicare Wellness Assessment   Medicare initial or wellness physical in the last year? No   Advance Directives   Does patient have a living will or MPOA No   Advance directive information given to the patient today? Patient Declined   Activities of Daily Living   Do you need help with dressing, bathing, or walking? No  (cane or walker)   Do you need help with shopping, housekeeping, medications, or finances? Yes  (shopping)   Do you have rugs in hallways, broken steps, or poor lighting? No   Do you have grab bars in your bathroom, non-slip strips in your tub, and hand rails on your stairs? No  (non slip mat)   Cognitive Function Screen   What is you age? 1   What is the time to the nearest hour? 1   Remember this address: [address]   What is the year? 1   What is the name of this clinic? 1   Can the patient recognize two persons (the doctor, the nurse, home help, etc.)? 1   What is the date of your birth? (day and month sufficient)  1   In what year did World War II end? 0   Who is the current president of the [place] ? 1   Count from 20 down to 1? 1   What address did I give you earlier? 1   Total Score 9   Interpretation of Total Score Greater than 6 Normal   Depression Screen   Little interest or pleasure in doing things. 0   Feeling down, depressed, or hopeless 0   PHQ 2 Total 0   Trouble falling or staying asleep, or sleeping too much. 0   Feeling tired or having little energy 3   Poor appetite or overeating 0   Feeling bad about yourself/ that you are a failure in the past 2 weeks? 0   Trouble concentrating on things in the past 2 weeks? 0   Moving/Speaking slowly or being fidgety or restless  in the past 2 weeks? 0   Thoughts that you would be better off DEAD, or of hurting yourself in some way. 0   If you checked off any problems, how difficult have these problems made it for you to do your work, take care of things at home, or get along with other people? Not difficult at all    PHQ 9 Total 3   Interpretation of Total Score No depression   Pain Score   Pain Score Zero   Substance Use Screening   In Past 12 MONTHS, how often have you used any tobacco product (for example, cigarettes, e-cigarettes, cigars, pipes, or smokeless tobacco)? Never   In the PAST 12 MONTHS, how often have you had 5 (men)/4 (women) or more drinks containing alcohol in one day? Never   In the PAST 12 months, how often have you used any prescription medications just for the feeling, more than prescribed, or that were not prescribed for you? Prescriptions may include: opioids, benzodiazepines, medications for ADHD Never   In the PAST 12 MONTHS, how often have you used any drugs, including marijuana, cocaine or crack, heroin, methamphetamine, hallucinogens, ecstasy/MDMA? Never   Fall Risk Assessment   Do you feel unsteady when standing or walking? No   Do you worry about falling? No   Have you fallen in the past year? Yes   How many times have you fallen? 2 or more  times   Were you ever injured from falling? No   Urinary Incontinence Screen   Do you ever leak urine when you don't want to? YES  (on medication)     Rosina Fuelling, BSN, RN  Wellness Nurse

## 2024-02-06 NOTE — Nursing Note (Signed)
 02/06/24 1454   PHQ 9 (follow up)   Little interest or pleasure in doing things. 0   Feeling down, depressed, or hopeless 0   Trouble falling or staying asleep, or sleeping too much. 0   Feeling tired or having little energy 3   Poor appetite or overeating 0   Feeling bad about yourself/ that you are a failure in the past 2 weeks? 0   Trouble concentrating on things in the past 2 weeks? 0   Moving/Speaking slowly or being fidgety or restless  in the past 2 weeks? 0   Thoughts that you would be better off DEAD, or of hurting yourself in some way. 0   If you checked off any problems, how difficult have these problems made it for you to do your work, take care of things at home, or get along with other people? Not difficult at all   PHQ 9 Total 3   Interpretation of Total Score No depression

## 2024-02-07 ENCOUNTER — Encounter (INDEPENDENT_AMBULATORY_CARE_PROVIDER_SITE_OTHER): Payer: Self-pay | Admitting: Internal Medicine

## 2024-02-07 ENCOUNTER — Encounter (HOSPITAL_COMMUNITY): Payer: Self-pay

## 2024-02-15 ENCOUNTER — Ambulatory Visit (INDEPENDENT_AMBULATORY_CARE_PROVIDER_SITE_OTHER): Admitting: INTERVENTIONAL CARDIOLOGY

## 2024-02-25 ENCOUNTER — Telehealth (INDEPENDENT_AMBULATORY_CARE_PROVIDER_SITE_OTHER): Payer: Self-pay | Admitting: Internal Medicine

## 2024-02-25 DIAGNOSIS — G43009 Migraine without aura, not intractable, without status migrainosus: Secondary | ICD-10-CM

## 2024-02-25 DIAGNOSIS — R5383 Other fatigue: Secondary | ICD-10-CM

## 2024-02-25 DIAGNOSIS — C7989 Secondary malignant neoplasm of other specified sites: Secondary | ICD-10-CM

## 2024-02-25 DIAGNOSIS — Z85118 Personal history of other malignant neoplasm of bronchus and lung: Secondary | ICD-10-CM

## 2024-02-25 DIAGNOSIS — M549 Dorsalgia, unspecified: Secondary | ICD-10-CM

## 2024-02-25 NOTE — Telephone Encounter (Signed)
 Patient calling back to request a referral to go to [facility] in [place], not [place].  Please let patient know when/if this is sent.  Order pended.   Olam Sharps, RN  [facility]  02/25/2024, 12:08

## 2024-02-25 NOTE — Telephone Encounter (Signed)
 Pt wants referral for PT for [facility] to go to [facility]. She disconnected the call. If she calls back route to nurse.     [name]  Centralized [facility]  02/25/2024, 12:03

## 2024-02-25 NOTE — Telephone Encounter (Signed)
 Patient notified that referral was faxed to [facility] in [place] as requested.     Odean Ponto  Clinical Medical Assistant

## 2024-03-04 ENCOUNTER — Telehealth (HOSPITAL_COMMUNITY): Payer: Self-pay | Admitting: Internal Medicine

## 2024-03-04 NOTE — Telephone Encounter (Signed)
 Gina B w/[facility] has called asking that the patient's referral be faxed over again as it was not received. I relay to her that it was faxed over on 09/22.    Please fax to [phone]    Thanks!  Lauraine Collier  Centralized [facility]  03/04/2024, 15:24

## 2024-03-04 NOTE — Telephone Encounter (Signed)
 Printed and re faxed to provided fax number.     Thank you,  Morna SAUNDERS
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
## 2024-03-04 NOTE — Telephone Encounter (Signed)
 Signing this encounter as the referral has been re-faxed to [facility].    Thanks!  Lauraine Collier  Centralized [facility]  03/04/2024, 16:12

## 2024-03-06 ENCOUNTER — Other Ambulatory Visit: Payer: Self-pay

## 2024-03-07 ENCOUNTER — Ambulatory Visit: Attending: INTERVENTIONAL CARDIOLOGY | Admitting: INTERVENTIONAL CARDIOLOGY

## 2024-03-07 VITALS — BP 115/71 | HR 83 | Resp 14 | Wt 155.0 lb

## 2024-03-07 DIAGNOSIS — Z8249 Family history of ischemic heart disease and other diseases of the circulatory system: Secondary | ICD-10-CM | POA: Insufficient documentation

## 2024-03-07 DIAGNOSIS — Z86718 Personal history of other venous thrombosis and embolism: Secondary | ICD-10-CM | POA: Insufficient documentation

## 2024-03-07 DIAGNOSIS — R0789 Other chest pain: Secondary | ICD-10-CM | POA: Insufficient documentation

## 2024-03-10 ENCOUNTER — Other Ambulatory Visit (INDEPENDENT_AMBULATORY_CARE_PROVIDER_SITE_OTHER): Payer: Self-pay | Admitting: Internal Medicine

## 2024-03-10 DIAGNOSIS — N39490 Overflow incontinence: Secondary | ICD-10-CM

## 2024-03-10 NOTE — Telephone Encounter (Signed)
 LOV 9/3  NOV 11/11    Darwin Rothlisberger B. Rik, BSN, RN  Clinical Nurse Coordinator

## 2024-03-14 ENCOUNTER — Encounter (HOSPITAL_COMMUNITY): Payer: Self-pay

## 2024-03-20 LAB — ECG 12 LEAD W/ INTERP (AMB USE ONLY) (MUSE, IN CLINC) (93005/93010)
Atrial Rate: 82 {beats}/min
Calculated P Axis: 78 degrees
Calculated R Axis: -52 degrees
Calculated T Axis: 38 degrees
PR Interval: 120 ms
QRS Duration: 94 ms
QT Interval: 380 ms
QTC Calculation: 443 ms
Ventricular rate: 82 {beats}/min

## 2024-04-15 ENCOUNTER — Ambulatory Visit (INDEPENDENT_AMBULATORY_CARE_PROVIDER_SITE_OTHER)

## 2024-04-15 ENCOUNTER — Encounter (HOSPITAL_COMMUNITY): Payer: Self-pay

## 2024-04-15 ENCOUNTER — Other Ambulatory Visit: Payer: Self-pay

## 2024-04-15 ENCOUNTER — Ambulatory Visit
Admission: RE | Admit: 2024-04-15 | Discharge: 2024-04-15 | Disposition: A | Discharge status: Home / Self Care | Source: Ambulatory Visit | Attending: INTERVENTIONAL CARDIOLOGY | Admitting: INTERVENTIONAL CARDIOLOGY

## 2024-04-15 ENCOUNTER — Encounter (INDEPENDENT_AMBULATORY_CARE_PROVIDER_SITE_OTHER): Payer: Self-pay | Admitting: Internal Medicine

## 2024-04-15 ENCOUNTER — Ambulatory Visit: Payer: Self-pay | Attending: Internal Medicine | Admitting: Internal Medicine

## 2024-04-15 VITALS — BP 108/68 | HR 84 | Temp 97.7°F | Wt 154.0 lb

## 2024-04-15 DIAGNOSIS — G4733 Obstructive sleep apnea (adult) (pediatric): Secondary | ICD-10-CM | POA: Insufficient documentation

## 2024-04-15 DIAGNOSIS — D649 Anemia, unspecified: Secondary | ICD-10-CM | POA: Insufficient documentation

## 2024-04-15 DIAGNOSIS — K5903 Drug induced constipation: Secondary | ICD-10-CM | POA: Insufficient documentation

## 2024-04-15 DIAGNOSIS — G43009 Migraine without aura, not intractable, without status migrainosus: Secondary | ICD-10-CM | POA: Insufficient documentation

## 2024-04-15 DIAGNOSIS — Z8249 Family history of ischemic heart disease and other diseases of the circulatory system: Secondary | ICD-10-CM | POA: Insufficient documentation

## 2024-04-15 DIAGNOSIS — R0789 Other chest pain: Secondary | ICD-10-CM | POA: Insufficient documentation

## 2024-04-15 DIAGNOSIS — M8580 Other specified disorders of bone density and structure, unspecified site: Secondary | ICD-10-CM | POA: Insufficient documentation

## 2024-04-15 DIAGNOSIS — M67449 Ganglion, unspecified hand: Secondary | ICD-10-CM | POA: Insufficient documentation

## 2024-04-15 LAB — CBC W/AUTO DIFF
BASOPHIL #: <0.1 x10ˆ3/uL (ref <=0.20)
BASOPHIL %: 0.9 %
EOSINOPHIL #: 0.3 x10ˆ3/uL (ref <=0.50)
EOSINOPHIL %: 4.5 %
HCT: 34 % — ABNORMAL LOW (ref 34.8–46.0)
HGB: 11.2 g/dL — ABNORMAL LOW (ref 11.5–16.0)
IMMATURE GRANULOCYTE #: <0.1 x10ˆ3/uL (ref <0.10)
IMMATURE GRANULOCYTE %: 0.4 % (ref 0.0–1.0)
LYMPHOCYTE #: 1.94 x10ˆ3/uL (ref 1.00–4.80)
LYMPHOCYTE %: 29 %
MCH: 31.4 pg (ref 26.0–32.0)
MCHC: 32.9 g/dL (ref 31.0–35.5)
MCV: 95.2 fL (ref 78.0–100.0)
MONOCYTE #: 0.69 x10ˆ3/uL (ref 0.20–1.10)
MONOCYTE %: 10.3 %
MPV: 12.5 fL (ref 8.7–12.5)
NEUTROPHIL #: 3.68 x10ˆ3/uL (ref 1.50–7.70)
NEUTROPHIL %: 54.9 %
PLATELETS: 255 x10ˆ3/uL (ref 150–400)
RBC: 3.57 x10ˆ6/uL — ABNORMAL LOW (ref 3.85–5.22)
RDW-CV: 16.7 % — ABNORMAL HIGH (ref 11.5–15.5)
WBC: 6.7 x10ˆ3/uL (ref 3.7–11.0)

## 2024-04-15 MED ORDER — EMGALITY 120 MG/ML SUBCUTANEOUS SYRINGE
INJECTION | SUBCUTANEOUS | 5 refills | Status: AC
Start: 2024-04-15 — End: ?
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
## 2024-04-15 NOTE — Nursing Note (Signed)
 04/15/24 1450   PHQ 9 (follow up)   Little interest or pleasure in doing things. 0   Feeling down, depressed, or hopeless 0   Trouble falling or staying asleep, or sleeping too much. 1   Feeling tired or having little energy 3   Poor appetite or overeating 0   Feeling bad about yourself/ that you are a failure in the past 2 weeks? 0   Trouble concentrating on things in the past 2 weeks? 0   Moving/Speaking slowly or being fidgety or restless  in the past 2 weeks? 0   Thoughts that you would be better off DEAD, or of hurting yourself in some way. 0   If you checked off any problems, how difficult have these problems made it for you to do your work, take care of things at home, or get along with other people? Not difficult at all   PHQ 9 Total 4   Interpretation of Total Score No depression

## 2024-04-15 NOTE — Nursing Note (Signed)
 BP 108/68 (Site: Left Arm, Patient Position: Sitting, Cuff Size: Adult)   Pulse 84   Temp 36.5 C (97.7 F) (Thermal Scan)   Wt 69.9 kg (154 lb)   SpO2 99%   BMI 24.86 kg/m   Powell Perry CMA

## 2024-04-15 NOTE — Nursing Note (Signed)
 04/15/24 1450   Domestic Violence   Because we are aware of abuse and domestic violence today, we ask all patients: Are you being hurt, hit, or frightened by anyone at your home or in your life?  N   Basic Needs   Do you have any basic needs within your home that are not being met? (such as Food, Shelter, [employer], Tranportation, paying for bills and/or medications) N

## 2024-04-17 ENCOUNTER — Ambulatory Visit (INDEPENDENT_AMBULATORY_CARE_PROVIDER_SITE_OTHER): Payer: Self-pay | Admitting: Internal Medicine

## 2024-04-21 LAB — TRANSTHORACIC ECHOCARDIOGRAM - ADULT
EF VISUAL ESTIMATE: 60
EF: 65

## 2024-05-06 ENCOUNTER — Ambulatory Visit (HOSPITAL_BASED_OUTPATIENT_CLINIC_OR_DEPARTMENT_OTHER): Payer: Self-pay

## 2024-05-07 ENCOUNTER — Telehealth (INDEPENDENT_AMBULATORY_CARE_PROVIDER_SITE_OTHER): Payer: Self-pay | Admitting: Hand Surgery

## 2024-05-07 NOTE — Telephone Encounter (Signed)
 05/07/24 Contacted patient regarding referral... she was unsure why she was referred to us  since they are unsure if cyst is cancer or not. Will touch base with her later... 05/07/24 M See

## 2024-06-04 ENCOUNTER — Other Ambulatory Visit (INDEPENDENT_AMBULATORY_CARE_PROVIDER_SITE_OTHER): Payer: Self-pay | Admitting: Internal Medicine

## 2024-06-04 DIAGNOSIS — M8580 Other specified disorders of bone density and structure, unspecified site: Secondary | ICD-10-CM

## 2024-06-04 NOTE — Telephone Encounter (Signed)
 LOV: 04/15/2024   NOV: 08/11/2024      Odean Ponto  Clinical Medical Assistant   Internal Medicine [place]

## 2024-06-30 ENCOUNTER — Ambulatory Visit

## 2024-07-02 ENCOUNTER — Other Ambulatory Visit (INDEPENDENT_AMBULATORY_CARE_PROVIDER_SITE_OTHER): Payer: Self-pay | Admitting: INTERNAL MEDICINE

## 2024-07-02 DIAGNOSIS — Z9109 Other allergy status, other than to drugs and biological substances: Secondary | ICD-10-CM

## 2024-07-02 NOTE — Telephone Encounter (Signed)
 LOV 11/11  NOV 3/9    Tamalyn Wadsworth B. Rik, BSN, RN  Clinical Nurse Coordinator

## 2024-08-11 ENCOUNTER — Ambulatory Visit (INDEPENDENT_AMBULATORY_CARE_PROVIDER_SITE_OTHER): Payer: Self-pay | Admitting: Internal Medicine

## 2024-08-28 ENCOUNTER — Ambulatory Visit: Payer: Self-pay | Admitting: INTERVENTIONAL CARDIOLOGY

## 2024-10-21 ENCOUNTER — Ambulatory Visit (INDEPENDENT_AMBULATORY_CARE_PROVIDER_SITE_OTHER): Payer: Self-pay | Admitting: INTERNAL MEDICINE-ENDOCRINOLOGY-DIABETES AND METABOLISM
